# Patient Record
Sex: Female | Born: 1935
Health system: Southern US, Community
[De-identification: ages and names within clinical notes are randomized; demographics above are authoritative.]

## PROBLEM LIST (undated history)

## (undated) DIAGNOSIS — K219 Gastro-esophageal reflux disease without esophagitis: Secondary | ICD-10-CM

## (undated) DIAGNOSIS — T7840XA Allergy, unspecified, initial encounter: Secondary | ICD-10-CM

## (undated) DIAGNOSIS — E119 Type 2 diabetes mellitus without complications: Secondary | ICD-10-CM

## (undated) DIAGNOSIS — G709 Myoneural disorder, unspecified: Secondary | ICD-10-CM

## (undated) DIAGNOSIS — M549 Dorsalgia, unspecified: Secondary | ICD-10-CM

## (undated) DIAGNOSIS — H269 Unspecified cataract: Secondary | ICD-10-CM

## (undated) DIAGNOSIS — I10 Essential (primary) hypertension: Secondary | ICD-10-CM

## (undated) DIAGNOSIS — R7303 Prediabetes: Secondary | ICD-10-CM

## (undated) DIAGNOSIS — Z8042 Family history of malignant neoplasm of prostate: Secondary | ICD-10-CM

## (undated) DIAGNOSIS — M51369 Other intervertebral disc degeneration, lumbar region without mention of lumbar back pain or lower extremity pain: Secondary | ICD-10-CM

## (undated) DIAGNOSIS — Q2381 Bicuspid aortic valve: Secondary | ICD-10-CM

## (undated) DIAGNOSIS — L0390 Cellulitis, unspecified: Secondary | ICD-10-CM

## (undated) DIAGNOSIS — C541 Malignant neoplasm of endometrium: Secondary | ICD-10-CM

## (undated) DIAGNOSIS — Q231 Congenital insufficiency of aortic valve: Secondary | ICD-10-CM

## (undated) DIAGNOSIS — C801 Malignant (primary) neoplasm, unspecified: Secondary | ICD-10-CM

## (undated) DIAGNOSIS — G8929 Other chronic pain: Secondary | ICD-10-CM

## (undated) DIAGNOSIS — N189 Chronic kidney disease, unspecified: Secondary | ICD-10-CM

## (undated) DIAGNOSIS — Z809 Family history of malignant neoplasm, unspecified: Secondary | ICD-10-CM

## (undated) DIAGNOSIS — Z923 Personal history of irradiation: Secondary | ICD-10-CM

## (undated) DIAGNOSIS — M5136 Other intervertebral disc degeneration, lumbar region: Secondary | ICD-10-CM

## (undated) DIAGNOSIS — M1990 Unspecified osteoarthritis, unspecified site: Secondary | ICD-10-CM

## (undated) DIAGNOSIS — D649 Anemia, unspecified: Secondary | ICD-10-CM

## (undated) DIAGNOSIS — M479 Spondylosis, unspecified: Secondary | ICD-10-CM

## (undated) HISTORY — PX: FRACTURE SURGERY: SHX138

## (undated) HISTORY — PX: BUNIONECTOMY: SHX129

## (undated) HISTORY — PX: EYE SURGERY: SHX253

## (undated) HISTORY — PX: JOINT REPLACEMENT: SHX530

## (undated) HISTORY — DX: Allergy, unspecified, initial encounter: T78.40XA

## (undated) HISTORY — DX: Chronic kidney disease, unspecified: N18.9

## (undated) HISTORY — DX: Personal history of irradiation: Z92.3

## (undated) HISTORY — DX: Spondylosis, unspecified: M47.9

## (undated) HISTORY — DX: Family history of malignant neoplasm, unspecified: Z80.9

## (undated) HISTORY — DX: Myoneural disorder, unspecified: G70.9

## (undated) HISTORY — PX: ABDOMINAL HYSTERECTOMY: SHX81

## (undated) HISTORY — DX: Type 2 diabetes mellitus without complications: E11.9

## (undated) HISTORY — PX: OTHER SURGICAL HISTORY: SHX169

## (undated) HISTORY — DX: Dorsalgia, unspecified: M54.9

## (undated) HISTORY — DX: Prediabetes: R73.03

## (undated) HISTORY — PX: FOOT SURGERY: SHX648

## (undated) HISTORY — DX: Unspecified cataract: H26.9

## (undated) HISTORY — PX: REPLACEMENT TOTAL KNEE BILATERAL: SUR1225

## (undated) HISTORY — DX: Other chronic pain: G89.29

## (undated) HISTORY — DX: Family history of malignant neoplasm of prostate: Z80.42

## (undated) HISTORY — DX: Other intervertebral disc degeneration, lumbar region: M51.36

## (undated) HISTORY — DX: Other intervertebral disc degeneration, lumbar region without mention of lumbar back pain or lower extremity pain: M51.369

---

## 1998-12-04 ENCOUNTER — Other Ambulatory Visit: Admission: RE | Admit: 1998-12-04 | Discharge: 1998-12-04 | Payer: Self-pay | Admitting: *Deleted

## 2000-04-26 ENCOUNTER — Encounter: Payer: Self-pay | Admitting: Family Medicine

## 2000-04-26 ENCOUNTER — Ambulatory Visit (HOSPITAL_COMMUNITY): Admission: RE | Admit: 2000-04-26 | Discharge: 2000-04-26 | Payer: Self-pay | Admitting: Family Medicine

## 2001-05-31 ENCOUNTER — Other Ambulatory Visit: Admission: RE | Admit: 2001-05-31 | Discharge: 2001-05-31 | Payer: Self-pay | Admitting: Family Medicine

## 2001-11-23 ENCOUNTER — Encounter: Payer: Self-pay | Admitting: Orthopedic Surgery

## 2001-11-27 ENCOUNTER — Inpatient Hospital Stay (HOSPITAL_COMMUNITY): Admission: RE | Admit: 2001-11-27 | Discharge: 2001-12-01 | Payer: Self-pay | Admitting: Orthopedic Surgery

## 2002-01-03 ENCOUNTER — Encounter: Admission: RE | Admit: 2002-01-03 | Discharge: 2002-02-23 | Payer: Self-pay | Admitting: Orthopedic Surgery

## 2002-02-26 ENCOUNTER — Inpatient Hospital Stay (HOSPITAL_COMMUNITY): Admission: RE | Admit: 2002-02-26 | Discharge: 2002-03-02 | Payer: Self-pay | Admitting: Orthopedic Surgery

## 2002-04-12 ENCOUNTER — Encounter: Admission: RE | Admit: 2002-04-12 | Discharge: 2002-07-11 | Payer: Self-pay | Admitting: Orthopedic Surgery

## 2002-07-12 ENCOUNTER — Encounter: Admission: RE | Admit: 2002-07-12 | Discharge: 2002-08-21 | Payer: Self-pay | Admitting: Orthopedic Surgery

## 2002-12-11 ENCOUNTER — Ambulatory Visit (HOSPITAL_COMMUNITY): Admission: RE | Admit: 2002-12-11 | Discharge: 2002-12-11 | Payer: Self-pay | Admitting: Orthopedic Surgery

## 2002-12-11 ENCOUNTER — Encounter: Payer: Self-pay | Admitting: Orthopedic Surgery

## 2002-12-24 ENCOUNTER — Encounter: Admission: RE | Admit: 2002-12-24 | Discharge: 2003-01-18 | Payer: Self-pay | Admitting: Orthopedic Surgery

## 2003-04-08 ENCOUNTER — Encounter: Admission: RE | Admit: 2003-04-08 | Discharge: 2003-07-07 | Payer: Self-pay | Admitting: Orthopedic Surgery

## 2003-04-30 ENCOUNTER — Other Ambulatory Visit: Admission: RE | Admit: 2003-04-30 | Discharge: 2003-04-30 | Payer: Self-pay | Admitting: Family Medicine

## 2003-07-02 ENCOUNTER — Encounter: Admission: RE | Admit: 2003-07-02 | Discharge: 2003-07-02 | Payer: Self-pay | Admitting: Family Medicine

## 2003-08-01 ENCOUNTER — Ambulatory Visit (HOSPITAL_COMMUNITY): Admission: RE | Admit: 2003-08-01 | Discharge: 2003-08-01 | Payer: Self-pay | Admitting: Obstetrics and Gynecology

## 2003-09-04 ENCOUNTER — Ambulatory Visit (HOSPITAL_COMMUNITY): Admission: RE | Admit: 2003-09-04 | Discharge: 2003-09-04 | Payer: Self-pay | Admitting: Gastroenterology

## 2003-09-24 ENCOUNTER — Ambulatory Visit (HOSPITAL_COMMUNITY): Admission: RE | Admit: 2003-09-24 | Discharge: 2003-09-24 | Payer: Self-pay | Admitting: Obstetrics and Gynecology

## 2003-09-24 ENCOUNTER — Encounter (INDEPENDENT_AMBULATORY_CARE_PROVIDER_SITE_OTHER): Payer: Self-pay | Admitting: Specialist

## 2003-12-31 ENCOUNTER — Encounter: Admission: RE | Admit: 2003-12-31 | Discharge: 2003-12-31 | Payer: Self-pay | Admitting: Family Medicine

## 2004-02-04 ENCOUNTER — Encounter: Admission: RE | Admit: 2004-02-04 | Discharge: 2004-02-04 | Payer: Self-pay | Admitting: Neurology

## 2004-02-24 ENCOUNTER — Encounter: Admission: RE | Admit: 2004-02-24 | Discharge: 2004-03-24 | Payer: Self-pay | Admitting: Neurology

## 2004-02-27 ENCOUNTER — Encounter: Admission: RE | Admit: 2004-02-27 | Discharge: 2004-02-27 | Payer: Self-pay | Admitting: Neurology

## 2004-03-19 ENCOUNTER — Encounter: Admission: RE | Admit: 2004-03-19 | Discharge: 2004-03-19 | Payer: Self-pay | Admitting: Neurology

## 2004-04-14 ENCOUNTER — Encounter: Admission: RE | Admit: 2004-04-14 | Discharge: 2004-05-13 | Payer: Self-pay | Admitting: Neurology

## 2004-06-29 ENCOUNTER — Encounter: Admission: RE | Admit: 2004-06-29 | Discharge: 2004-06-29 | Payer: Self-pay | Admitting: Family Medicine

## 2005-07-02 ENCOUNTER — Encounter: Admission: RE | Admit: 2005-07-02 | Discharge: 2005-07-02 | Payer: Self-pay | Admitting: Family Medicine

## 2005-10-01 ENCOUNTER — Other Ambulatory Visit: Admission: RE | Admit: 2005-10-01 | Discharge: 2005-10-01 | Payer: Self-pay | Admitting: Family Medicine

## 2006-07-04 ENCOUNTER — Encounter: Admission: RE | Admit: 2006-07-04 | Discharge: 2006-07-04 | Payer: Self-pay | Admitting: *Deleted

## 2006-07-08 ENCOUNTER — Encounter: Admission: RE | Admit: 2006-07-08 | Discharge: 2006-07-08 | Payer: Self-pay | Admitting: *Deleted

## 2007-01-12 ENCOUNTER — Encounter: Admission: RE | Admit: 2007-01-12 | Discharge: 2007-04-12 | Payer: Self-pay | Admitting: Neurology

## 2007-07-06 ENCOUNTER — Encounter: Admission: RE | Admit: 2007-07-06 | Discharge: 2007-07-06 | Payer: Self-pay | Admitting: *Deleted

## 2008-07-08 ENCOUNTER — Encounter: Admission: RE | Admit: 2008-07-08 | Discharge: 2008-07-08 | Payer: Self-pay | Admitting: *Deleted

## 2009-07-09 ENCOUNTER — Encounter: Admission: RE | Admit: 2009-07-09 | Discharge: 2009-07-09 | Payer: Self-pay | Admitting: *Deleted

## 2010-04-09 ENCOUNTER — Ambulatory Visit (HOSPITAL_COMMUNITY): Admission: RE | Admit: 2010-04-09 | Discharge: 2010-04-09 | Payer: Self-pay | Admitting: Ophthalmology

## 2010-04-23 ENCOUNTER — Ambulatory Visit (HOSPITAL_COMMUNITY): Admission: RE | Admit: 2010-04-23 | Discharge: 2010-04-23 | Payer: Self-pay | Admitting: Ophthalmology

## 2010-07-14 ENCOUNTER — Encounter
Admission: RE | Admit: 2010-07-14 | Discharge: 2010-07-14 | Payer: Self-pay | Source: Home / Self Care | Attending: *Deleted | Admitting: *Deleted

## 2010-10-01 LAB — HEMOGLOBIN AND HEMATOCRIT, BLOOD: HCT: 31.8 % — ABNORMAL LOW (ref 36.0–46.0)

## 2010-10-01 LAB — BASIC METABOLIC PANEL
CO2: 26 mEq/L (ref 19–32)
Chloride: 107 mEq/L (ref 96–112)
GFR calc Af Amer: >60 mL/min (ref >60)
Sodium: 139 mEq/L (ref 135–145)

## 2010-10-01 LAB — GLUCOSE, CAPILLARY: Glucose-Capillary: 98 mg/dL (ref 70–99)

## 2010-12-04 NOTE — Op Note (Signed)
NAME:  Susan Haney, Susan Haney                         ACCOUNT NO.:  [id number]   MEDICAL RECORD NO.:  [id number]                   PATIENT TYPE:  AMB   LOCATION:  ENDO                                 FACILITY:  [facility]   PHYSICIAN:  Anselmo Rod, M.D.               DATE OF BIRTH:  Feb 27, 1932   DATE OF PROCEDURE:  09/04/2003  DATE OF DISCHARGE:                                 OPERATIVE REPORT   PROCEDURE:  Screening colonoscopy.   ENDOSCOPIST:  Charna Elizabeth, M.D.   INSTRUMENT USED:  Olympus video colonoscope.   INDICATIONS FOR PROCEDURE:  [age]-year-old white female undergoing  screening colonoscopy.  The patient has had some left lower quadrant pain.  Rule out colonic polyps, masses, hemorrhoids, diverticulosis, etc.   PROCEDURE PERFORMED:  Informed consent was procured from the patient.  The  patient fasted for eight hours prior to the procedure and prepped with a  bottle of magnesium citrate and a gallon of GOLYTELY the night prior to the  procedure.   PREPROCEDURE PHYSICAL EXAMINATION:  VITAL SIGNS:  The patient had stable  vital signs.  NECK:  Supple.  CHEST:  Clear to auscultation.  HEART:  S1 and S2 regular.  ABDOMEN:  Soft with normal bowel sounds.   DESCRIPTION OF PROCEDURE:  The patient was placed in the left lateral  decubitus position, sedated with 50 mg of Demerol and 5 mg of Versed  intravenously.  Once the patient was adequately sedated and maintained on  low flow oxygen, and continuous cardiac monitoring, the Olympus video  colonoscope was advanced from the rectum to the cecum.  The appendiceal  orifice and the ileocecal valve were clearly visualized and photographed.  No masses, polyps, erosions, ulcerations or diverticula were seen.  Retroflexion in the rectum revealed no abnormalities.   IMPRESSION:  Normal colonoscopy to the cecum.   RECOMMENDATIONS:  1. Continue a high fiber diet.  2. Liberal fluid intake.  3. Repeat CRC screening in the next 10 years  unless the patient develops any     abnormal symptoms in the interim.  4. Outpatient follow as needed in the future.                                               Anselmo Rod, M.D.    JNM/MEDQ  D:  09/04/2003  T:  09/04/2003  Job:  [id number]   cc:   Ernestina Penna, M.D.  [address] [place]  [place] [address]  Fax: [phone]   Osborn Coho, M.D.

## 2010-12-04 NOTE — H&P (Signed)
[place]  Patient:    LEIGH, BLAS Visit Number: [id number] MRN: [id number]          Service Type: Attending:  Georgena Spurling, M.D. Dictated by:   Jamelle Rushing, P.A. Adm. Date:  11/27/01                           History and Physical  DATE OF BIRTH:  11/04/1931  CHIEF COMPLAINT:  Left knee pain.  HISTORY OF PRESENT ILLNESS:  The patient is a 77 year old white female with approximately four years of progressively worsening left knee pain.  The patient states that she started with just a chronic swelling sensation in the knee, was evaluated by an orthopedist up in [place] and received multiple cortisone injections without improvement.  Further evaluation several months later indicated meniscal injury, so an arthroscopic procedure was done to debride the meniscus and the patient continued to have no improvement.  The patient has continued to progressively worsen with swelling and pain in her knee over the years.  She has recently tried Hyalgan injections with no improvement.  The patient currently is unable to fully extend her knee at the current time.  She does have a sharp pain with some dullness and burning sensation in the knee at various times with ambulation.  She does have significant night pain.  She does have significant swelling.  She denies any mechanical symptoms other than when forcibly trying to hyperextend her knee. The patient is not using any assistive device at this time and x-rays show severe tricompartment osteoarthritis.  ALLERGIES:  No known drug allergies.  CURRENT MEDICATIONS: 1. Celebrex 200 mg p.o. q.d. 2. Ultracet one or two tablets every day. 3. Prinzide 10/12.5 mg p.o. q.d. 4. Prilosec 20 mg p.o. q.d. 5. Calcium 1500 mg p.o. q.d. 6. Multivitamin one tablet p.o. q.d. 7. ______ hormone therapy one tablet p.o. q.d.  PAST MEDICAL HISTORY: 1. Hypertension, currently well-controlled with Dr. Monica Becton. 2. Reflux disease -- improves with Prilosec. 3. The patient is scheduled for a cardiac stress test on May 7th with the    [facility] as a precautionary evaluation; results will be forwarded to    our office when available.  PAST SURGICAL HISTORY: 1. de Quervains, right wrist. 2. Arthroscopy, left knee.  The patient denies any complications with either of the above procedures.  SOCIAL HISTORY:  The patient is an obese 77 year old white female who denies any smoking or alcohol use.  The patient is currently married.  She does have several grown children.  She lives in a [place] house with a full basement. 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  FAMILY PHYSICIAN:  Dr. Monica Becton.  CARDIOLOGIST:  Dr. Cecil Cranker.  FAMILY MEDICAL HISTORY:  Mother is deceased from complications of diabetes and heart failure.  Father is deceased from prostate cancer.  The patient has got two brothers, the eldest having diabetes mellitus, type 2, the second healthy. The patient has got three sisters, one of them having diabetes mellitus, type 2, the rest being in good medical health.  REVIEW OF SYSTEMS:  Positive for glasses at all times.  The patient does have occasional shortness of breath with exertion, probably due to her conditioning due to her knee problems and her obesity.  She is being evaluated with a cardiac stress test on May 5th.  The patient  also has problems with reflux, increases with the Celebrex use, but it is currently well-controlled with Prilosec.  Otherwise, review of systems are all negative.  PHYSICAL EXAMINATION:  VITAL SIGNS:  Height is 5 foot 5 inches.  Weight is 232 pounds.  Pulse rate 72 and regular.  Respirations 14.  Temperature is 98.8.  Blood pressure is 150/88.  GENERAL:  This is a healthy-appearing well-developed, obese white female.  She ambulates slowly but with no significant limp.  She  does have a slightly obvious left leg valgus deformity.  She is able to get on and off the exam table without much difficulty.  HEENT:  Head was normocephalic, atraumatic, nontender over maxillary or frontal sinuses.  Pupils equal, round and reactive, accommodating to light. Extraocular movements intact.  Sclerae not icteric.  Conjunctivae pink and moist.  External ears were without deformities.  Canals patent.  TMs pearly gray and intact.  Gross hearing is intact.  Nasal septum was midline.  Mucous membranes pink and moist.  Oral buccal mucosa was pink and moist without lesions.  Dentition was in good repair.  Uvula was midline.  The patient is able to swallow without difficulty.  NECK:  The patient had no palpable lymphadenopathy.  Thyroid gland was nontender.  She had good range of motion of her cervical spine without any difficulty.  BACK:  The rest of the thoracic and lumbar spine was only tender to percussion in the lower lumbar region.  CHEST:   Lung sounds were clear and equal bilaterally.  No wheezes, rales, rhonchi or rubs noted.  HEART:  Regular rate and rhythm.  S1 and S2 were auscultated.  No murmurs, rubs, or gallops noted.  ABDOMEN:  Round, obese, soft, nontender to deep palpation.  Unable to palpate any hepatosplenomegaly.  Bowel sounds were normoactive throughout.  CVA was nontender to percussion.  EXTREMITIES:  Upper extremities were symmetrically sized and shaped.  She had excellent range of motion of her shoulders, elbows and wrists without any difficulty.  Motor strength was 5/5.  Lower extremities:  Right and left hips had full extension, flexion up to 90 degrees, limited by habitus.  The patient had 20 degrees internal/external rotation without any mechanical symptoms or discomfort.  Bilateral knees were virtually symmetrically sized and shaped without any signs of erythema or ecchymosis, except for the left knee had some slight proximal tibial  swelling. There were no palpable effusions of either knee.  Right knee had full  extension, flexion back to approximately 100 degrees, limited by body habitus. She had no valgus or varus laxity and no anterior or posterior drawer and the calf was nontender.  Left knee was significantly tender along the medial joint line and the medial proximal tibia.  She had some coarse crepitus under the patella with range of motion, which was limited from 10 to 90 degrees.  She had no significant valgus/varus laxity, though she did have an approximately 15 degree valgus deformity.  The calf was nontender.  Bilateral ankles were symmetrical with good dorsi/plantar flexion.  PERIPHERAL VASCULATURE:  Carotid pulses were 2+, radial pulses 2+, dorsalis pedis and posterior tibial pulses were 1+.  The patient had 1+ pitting edema. She had no significant pigmentation changes and no significant varicosities and the patient had no carotid bruits noted.  NEUROLOGIC:  The patient was conscious, alert and appropriate, held an easy conversation with the examiner.  Cranial nerves II-XII were grossly intact. Deep tendon reflexes of the biceps, triceps, brachioradialis, knee and Achilles  were symmetrical, right to left, 1+.  The patient was grossly intact to light touch sensation from head to toe.  BREASTS, RECTAL AND GU:  Exams were deferred at this time.  IMPRESSION: 1. End-stage tricompartment osteoarthritis, left knee. 2. Hypertension. 3. Reflux disease. 4. Obesity.  PLAN:  The patient is scheduled for a cardiac stress test with Dr. Jackey Loge clinic on May 7th.  The patient reports that all reports will be forwarded to our office when they become available.  The patient is scheduled for a left total knee arthroplasty at [facility] on May 12th.  The patient will undergo all other routine labs and tests prior to this procedure.Dictated by: Jamelle Rushing, P.A. Attending:  Georgena Spurling, M.D. DD:   11/21/01 TD:  11/22/01 Job: [id number]

## 2010-12-04 NOTE — Op Note (Signed)
   NAMEMarland Kitchen  Susan Haney, Susan Haney                         ACCOUNT NO.:  [id number]   MEDICAL RECORD NO.:  [id number]                   PATIENT TYPE:  INP   LOCATION:  5029                                 FACILITY:  [facility]   PHYSICIAN:  Mila Homer. Sherlean Foot, M.D.              DATE OF BIRTH:  Oct 08, 1931   DATE OF PROCEDURE:  DATE OF DISCHARGE:                                 OPERATIVE REPORT   ASSISTANT:  Jamelle Rushing, P.A.   PREOPERATIVE DIAGNOSIS:  Right knee osteoarthritis.   POSTOPERATIVE DIAGNOSIS:  Right knee osteoarthritis.   PROCEDURE:  Right total knee arthroplasty.   INDICATIONS FOR PROCEDURE:  The patient is a 77 year old white female with  failure of conservative measures for osteoarthritis of the knee.  The left  knee was done three months ago.  Informed consent was obtained.   DESCRIPTION OF PROCEDURE:  The patient was laid supine, administered general  anesthesia and a Foley catheter placed.  The right lower extremity was  prepped and draped in the usual sterile fashion.  A standard midline  incision was made with a #10 blade, and then a fresh blade was used to make  a median parapatellar arthrotomy.  A synovectomy was performed.                                               Mila Homer. Sherlean Foot, M.D.    SDL/MEDQ  D:  02/26/2002  T:  02/28/2002  Job:  [id number]

## 2010-12-04 NOTE — Op Note (Signed)
[place]  Patient:    Susan Haney, Susan Haney Visit Number: [id number] MRN: [id number]          Service Type: SUR Location: 5000 [id number] [id number] Attending Physician:  Georgena Spurling Dictated by:   Georgena Spurling, M.D. Proc. Date: 11/27/01 Admit Date:  11/27/2001 Discharge Date: 12/01/2001                             Operative Report  PREOPERATIVE DIAGNOSIS:  Left knee arthritis.  POSTOPERATIVE DIAGNOSIS:  Left knee arthritis.  OPERATION PERFORMED:  Left total knee arthroplasty.  SURGEON:  Georgena Spurling, M.D.  ASSISTANT:  Jamelle Rushing, P.A.  ANESTHESIA:  General.  INDICATIONS FOR PROCEDURE:  The patient is an elderly white female with failure of conservative treatment of arthritis of the knee.  Informed consent was obtained.  DESCRIPTION OF PROCEDURE:  The patient was laid supine and administered general endotracheal anesthesia and Foley catheter placement.  The left lower extremity was then prepped and draped in the usual sterile fashion.  We then made a standard midline incision with a #10 blade and used a fresh 10 blade to perform a medial parapatellar arthrotomy.  Then I everted the patella and measured it to be 21 mm thick with calipers and used a 32 mm diameter reamer to ream down to 12 mm, used a 32 mm template to drill three lug holes and with the trial in place and also measured 21 mm.  We then everted the patella and removed the trial and subperiosteally dissected the proximal tibia, freeing the deep MCL all the way around to the semimembranosus tendon.  We then placed a Z-retractor in place, brought the knee into flexion with the patella everted, cut the ACL and PCL.  We then subluxed the tibia forward and aligned our tibial tower to make a cut perpendicular to the long axis of the tibia. We used the ____________ positioner, to judge our depth of resection.  At this point we pinned our tibial tower into place and cut our tibia with  the sagittal saw.  At this point I then removed the cut surface of the tibia and turned our attention to the femur.  Then made an intramedullary hole into the femur.  Then had the intramedullary guide set on 6 degrees left, tamped down to the distal aspect of the femur.  At this point I pinned the three degree cutting block into place and removed the intramedullary guide.  I then made a distal femoral cut and removed the distal condyles.  We then drew our epicondylar axis.  Then measured the posterior condylar angle at 3 degrees. Then placed the sizer in place, sized to a size E and at this point we pinned through the 3 degree hole.  At this point we then used the 4 in 1 cutter and made our anterior, posterior and chamfer cuts.  We then pushed the lamina spreader in the lateral compartment and removed our ACL, PCL, and posterior condylar osteophytes and medial meniscus.  We then placed it in the medial compartment and removed our lateral meniscus, posterior condylar osteophytes and stripped the posterior capsule off the calcar of the femur.  We then trialed with the 10 mm spacer block and had good flexion and extension gap balancing.  We then finished the femur with the finishing guide cutting our notch and lug holes.  We then finished our tibia with a size 4 tibia  with a drill keel.  At this point we trialed and had excellent flexion and extension and good patellar tracking.  We then removed the trials and copiously irrigated with the pulse lavage system.  We then mixed cement and cemented the tibia first, femur second, patella third and snapped in the real 10 mm polyethylene.  Once the cement had hardened, we let the tourniquet down and cauterized bleeding vessels and began to close.  We did leave a medium Hemovac deep to the arthrotomy.  We used #1 Vicryl figure-of-eight sutures to close the arthrotomy and 2-0 sutures to close the deep soft tissues, a subcuticular 2-0 Vicryl and then  skin staples placed in 100 degrees of flexion.  We dressed with Adaptic, 4 x 4s, sterile Webril and TED stockings.  Tourniquet time was 47 minutes.  COMPLICATIONS:  None.  DRAINS:  One Hemovac.  ESTIMATED BLOOD LOSS:  300 cc. Dictated by:   Georgena Spurling, M.D. Attending Physician:  Georgena Spurling DD:  11/27/01 TD:  11/29/01 Job: [id number]

## 2010-12-04 NOTE — H&P (Signed)
NAME:  Susan Haney, Susan Haney                         ACCOUNT NO.:  [id number]   MEDICAL RECORD NO.:  [id number]                   PATIENT TYPE:  INP   LOCATION:  NA                                   FACILITY:  [facility]   PHYSICIAN:  Mila Homer. Sherlean Foot, M.D.              DATE OF BIRTH:  Nov 14, 1931   DATE OF ADMISSION:  02/26/2002  DATE OF DISCHARGE:                                HISTORY & PHYSICAL   CHIEF COMPLAINT:  Right knee pain.   HISTORY OF PRESENT ILLNESS:  The patient is a 77 year old white female with  a history of left total knee arthroplasty in May 2003 with good results.  Since that time the patient has noticed significantly increased worsening of  her right knee discomfort. She states it crunches with any type of  ambulation. She describes the pain as a deep aching sensation with no  radiation. She does have night pain, which is worse with long activities  throughout the day. X-rays revealed a severe osteoarthritis right knee.   ALLERGIES:  No known drug allergies.   CURRENT MEDICATIONS:  1. Bextra 10 mg p.o. q.d.  2. Vicodin p.r.n.  3. Prinizide 10/12.5 mg p.o. q.d.  4. Prilosec 20 mg p.o. q.d.  5. Menopause support 1 tablet p.o. q.d.   PAST MEDICAL HISTORY:  1. Hypertension.  2. Reflux disease.  3. Obesity.   PAST SURGICAL HISTORY:  1. Right wrist deQuervain debridement in 1993.  2. Left knee arthroscopy in 1998.  3. Left total knee arthroplasty in 2003.   The patient denies any significant complications with the above mentioned  procedures.   SOCIAL HISTORY:  The patient is a 77 year old white obese female. The  patient denies any history for alcohol use. She is married. Lives with her  husband in a one story house. She is a retired Curator.   FAMILY PHYSICIAN:  Ernestina Penna, M.D.   FAMILY MEDICAL HISTORY:  Mother is deceased from diabetes and heart failure.  Father is deceased from prostate cancer. The patient has two brothers and  three  sisters alive and in good health.   REVIEW OF SYMPTOMS:  Positive for glasses at all times. She does have  shortness of breath with exertion, but she does not have any chest pain,  diaphoresis related with it and she contributes this to her deconditioning  and knee discomfort. The patient does have reflux which improves with  Prilosec and occasional problems with diarrhea.   PHYSICAL EXAMINATION:   VITAL SIGNS:  Height is 5 feet 5, weight is 230 pounds, pulse 84 and  regular, respirations 12, temperature 99.2, blood pressure 162/78.   GENERAL:  This is a healthy-appearing well-developed, slightly obese white  female. Ambulated with altered gait due to left total knee arthroplasty  three months old and a right severe osteoarthritis. The patient is able to  get herself on an off of the exam  table without any difficulty.   HEENT:  Head is normocephalic, atraumatic. Nontender over maxillary or  frontal sinuses. Pupils equal, round, and reactive to light and  accommodation. Extraocular movements are intact. Sclerae is nonicteric.  Conjunctivae is pink and moist without lesions. External ears without  deformity. Canals are patent. TMs are pearly gray and intact. Gross hearing  is intact. Nasal septum is deviated to the left. Nares are patent. Oral  buccal mucosa is pink and moist without lesions. Dentition was in good  repair. Uvula was midline, symmetrical with phonation. The patient is able  to swallow without difficulty.   NECK:  Supple. No palpable lymphadenopathy. Thyroid gland was nontender. The  patient had good range of motion of the cervical spine without any  difficulty or tenderness. She had no tenderness with percussion along the  spinous processes of the entire spinal column.   CHEST:  Lung sounds were clear and equal bilaterally. No wheezes, rales,  rhonchis, or rubs noted.   HEART:  Regular rate and rhythm. S1 and S2 auscultated. No murmurs, rubs, or  gallops noted.    ABDOMEN:  Round, obese, soft. Unable to palpate any hepatosplenomegaly due  to her obesity. Bowel sounds are normal active throughout. CVA was nontender  to percussion.   EXTREMITIES:  Upper extremity was symmetrically size and shape. She has  excellent range of motion of her shoulders, elbows, and wrists without any  difficulty. Motor strength was 5/5.   Lower extremity:  Right and left hip had full extension, flexion up to 100  degrees with 20 degrees internal external rotation without any mechanical  symptoms or discomfort. The left knee had a well-healed midline surgical  incision. It still had some slight amount of soft tissue swelling, but no  palpable effusion. Range of motion was five degrees short of full extension  and flexion back to 95 degrees. She had no instability in the knee and the  calf was nontender. The right knee was without any signs of erythema or  ecchymosis. She did have some slight soft tissue swelling. No palpable  effusion. She was tender along the medial joint line region. Range of motion  was full extension and flexion back to 100 degrees. She had no instability  or valgus varus laxity. She had no calf tenderness. Bilateral ankles were  symmetrical with good dorsi and plantar flexion.   Peripheral vasculature:  The carotid pulses were 2+, no bruits. The radial  pulses were 2+. Dorsalis pedis and posterior tibial pulses were 1+. She had  1+ pitting edema in the lower extremities with some scattered varicosities,  but no pigmentation changes.   NEUROLOGIC:  The patient was conscious, alert, and appropriate. Held easy  conversation. Examination of cranial nerves 2-12 were grossly intact. Deep  tendon reflexes of the upper and lower extremities were symmetrical right to  left. The patient was intact head to toe to light touch sensation.   Breast, rectal, and GU exams were deferred at this time.    IMPRESSION: 1. End-stage osteoarthritis right knee.  2.  Hypertension.  3. Reflux disease.  4. Obesity.   PLAN:  The patient will be admitted to [facility] on 02/26[facility] under  the care of Dr. Georgena Spurling. The patient will undergo all routine labs and  tests prior to having a right total knee arthroplasty.   DICTATED BY::  Jamelle Rushing, P. A.  Mila Homer. Sherlean Foot, M.D.    SDL/MEDQ  D:  02/14/2002  T:  02/19/2002  Job:  [id number]

## 2010-12-04 NOTE — Op Note (Signed)
NAME:  Susan Haney, BERTE                         ACCOUNT NO.:  [id number]   MEDICAL RECORD NO.:  [id number]                   PATIENT TYPE:  AMB   LOCATION:  SDC                                  FACILITY:  [facility]   PHYSICIAN:  Osborn Coho, M.D.                DATE OF BIRTH:  [birth year]   DATE OF PROCEDURE:  09/24/2003  DATE OF DISCHARGE:                                 OPERATIVE REPORT   PREOPERATIVE DIAGNOSES:  1. Left ovarian mass.  2. Thickened endometrial echo.  3. Submucosal fibroid.   POSTOPERATIVE DIAGNOSES:  1. Left ovarian mass.  2. Thickened endometrial echo.  3. Submucosal fibroid.   PROCEDURE:  1. Laparoscopic left oophorectomy.  2. Hysteroscopy with resection of fibroid.  3. D&C.   ANESTHESIA:  General.   ATTENDING:  Dr. Osborn Coho.   ASSISTANT:  Dr. Jaymes Graff for laparoscopy portion of procedure.   FLUIDS:  2300 mL.   URINE OUTPUT:  150 mL.   ESTIMATED BLOOD LOSS:  Minimal.   COMPLICATIONS:  None.   PATHOLOGY:  Left ovary and tube, serous cyst fluid, portions of submucosal  fibroid, endometrial curetting.   FINDINGS:  Approximately a 7 cm left ovarian mass with internally serous  straw-colored fluid and approximately 2-3 cm right fundal fibroid  subserosal, and approximately 2 cm submucosal fibroid.   DESCRIPTION OF PROCEDURE:  The patient was taken to the operating room after  the risks, benefits, and alternatives were discussed with the patient.  The  patient verbalized understanding and consent signed and witnessed.  The  patient was placed under general anesthesia and prepped and draped in the  normal sterile fashion.  A bivalve speculum was placed in the patient's  vagina and the anterior lip of the cervix grasped with a single-tooth  tenaculum.  The cervix was dilated for passage of the Hulka, and the Hulka  was introduced for intrauterine manipulation.  Attention was then turned to  the abdomen where a 10 mm umbilical incision was  made.  A Veress needle was  passed into the intra-abdominal cavity and pneumoperitoneum achieved.  The  ovaries were noted bilaterally, and the left was noted to have an  approximately 7 cm simple cyst.  Attention was then turned to the right and  left lower quadrants where a 5 mm incision was made respectively in each  lower quadrant.  Then 5 mm trocars were advanced under direct visualization  in the left and right lower quadrants.  The cyst was aspirated with serous  straw-colored fluid returning which was sent to pathology.  Vicryl Endoloops  were then placed x 2 around the ovary after the uteroovarian ligament and  fallopian tube were excised from the uterus using the tripolar.  After the  ovary and fallopian tube were ligated using the two Vicryl Endoloops, the  ovary and fallopian tube were excised and sent to pathology.  In order to  remove the ovary and fallopian tube from the intra-abdominal cavity, an  Endopouch was used.  Prior to using the Endopouch, the left lower quadrant  incision was made into a 10 mm incision, and 10 mm trocar advanced under  direct visualization.  The Endopouch with contents of fallopian tube and  ovary were removed without difficulty.  The tripolar was then used to  cauterize the remaining pedicle which was not incorporated in the Endoloop  immediately adjacent to the uterus and near the broad ligament and round  ligament.  Hemostasis was noted.  Washing had been sent prior to the  aspiration.  The pneumoperitoneum was relieved.  The 10 mm trocar sites were  repaired using 0 Vicryl on the fascia and 3-0 Monocryl on subcuticular  stitch.  Then 3-0 Monocryl was used as an interrupted to close the right  lower quadrant 5 mm incision.  Attention was then turned to the vagina where  a bivalve speculum was again placed in the patient's vagina and the Hulka  removed.  A tenaculum was placed on the anterior lip. and the cervix was  dilated for passage of  diagnostic hysteroscope.  The uterus had been sounded  to approximately 8 cm.  The diagnostic hysteroscope was introduced, and a  fundal submucosal fibroid was noted.  The resectoscope was then used after  dilating the cervix for passage of the resectoscope and submucosal fibroid  was resected and portions of fibroid sent to pathology.  Instruments were  removed after curettage was performed and tissue sent to pathology.  There  was minimal tissue returning from the curettage.  The tenaculum sites were  hemostatic.  Sponge, lap, and needle count was correct.  The patient  tolerated the procedure well and was returned to recovery room in good  condition.                                               Osborn Coho, M.D.    AR/MEDQ  D:  09/24/2003  T:  09/25/2003  Job:  [id number]

## 2010-12-04 NOTE — Discharge Summary (Signed)
NAME:  Susan Haney, Susan Haney                         ACCOUNT NO.:  [id number]   MEDICAL RECORD NO.:  [id number]                   PATIENT TYPE:  INP   LOCATION:  5029                                 FACILITY:  [facility]   PHYSICIAN:  Jamelle Rushing, P.A.                DATE OF BIRTH:  November 13, 1931   DATE OF ADMISSION:  02/26/2002  DATE OF DISCHARGE:  03/02/2002                                 DISCHARGE SUMMARY   ADMISSION DIAGNOSES:  1. Right knee end stage osteoarthritis.  2. Hypertension.  3. Reflux disease.  4. Obesity.   DISCHARGE DIAGNOSES:  1. Right total knee arthroplasty.  2. Postop blood loss anemia, asymptomatic.  3. Hypokalemia.  4. Hypertension.  5. Reflux disease.  6. Obesity.   HISTORY OF PRESENT ILLNESS:  The patient is a 77 year old white female with  a history of right knee pain for many years. The patient had a left total  knee arthroplasty in May of 2003 with good results up until this point. The  pain in the right knee worsens after the left total knee arthroplasty. The  pain is described as a deep aching sensation with crunching with ambulation.  It is mostly along the medial joint line. She does have swelling. She does  have night pain with long periods of day ambulation.   ALLERGIES:  No known drug allergies.   CURRENT MEDICATIONS:  1. Bextra 10 mg po QD.  2. Vicodin as needed.  3. Prinizide 10/12.5 mg po QD.  4. Prilosec 20 mg po QD.  5. Metaphase support daily.   SURGICAL PROCEDURE:  On February 26, 2002 the patient was taken to the OR by  Dr. Georgena Spurling, assisted by Arlyn Leak, P.A.C. and under general  anesthesia, the patient underwent a right total knee arthroplasty. The  patient tolerated the procedure well. One medium Hemovac drain was left in  place and the patient had a postop femoral nerve block for assistance in  pain management. The patient was transferred to the recovery room and then  to the orthopedic floor in good condition.   CONSULTATIONS:  Physical therapy, occupational therapy, and case management.   HOSPITAL COURSE:  On February 26, 2002 the patient was admitted to [facility] under the care of Dr. Sherlean Foot. The patient was taken to the OR where  a right total knee arthroplasty was performed. The patient tolerated the  procedure well. Received a postop femoral nerve block for pain management  assistance and was transferred to the recovery room and then to the  orthopedic floor in good condition. The patient was then placed on Lovenox  for routine deep vein thrombosis prophylaxis. The patient then incurred a  total of four days postop care on the orthopedic floor in which the patient  did develop some asymptomatic postop blood loss anemia. This was just  monitored and felt to be comfortably managed with iron  supplement and allow  the patient to recover on her own. The patient did develop some hypokalemia  with her potassium dropping to 3.4. She did have po replacement and did  improve without any problems. The patient worked well with PT and used the  CPM well. She was able to reach 90 degrees without any problems She was able  to ambulate at least 50 degrees with just supervision with a straight  walker. The patient's wound remained benign for any signs of infection. Leg  remained neuro motor vascularly intact. So on postop day four, the patient  was felt to be ready to be discharged to home in good condition.  Arrangements were made for home health follow-up and the patient was  discharged.   LABORATORY DATA:  Routine chemistry on March 02, 2002 revealed sodium of  138, potassium 3.8, glucose 137, BUN 14, creatinine 0.8. CBC revealed WBC of  10.4. Hemoglobin and hematocrit 9.3 and 28.8. Platelets 288. Routine  urinalysis on March 01, 2002 was negative for any signs of urinary tract  infection.   DIAGNOSTIC STUDIES:  Admission chest x-ray showed no evidence of active  disease.   HOSPITAL  MEDICATIONS:  1. Colace 100 mg po bid.  2. Trinsicon one tab po tid.  3. Lovenox 30 mg subcutaneous every 12 hours. Upon discharge, will be 40 mg     subcutaneous daily.  4. Lisinopril 10 mg po QD.  5. HCTZ 12.5 mg po QD.  6. Protonix 40 mg po QD.  7. OxyContin CR 10 mg po every 12 hours.  8. Potassium chloride 20 mEq po bid.  9. Laxative or enema of choice as needed.  10.      Percocet one or two tabs every 4-6 hours as needed.  11.      Tylenol 650 mg po every four hours as needed.  12.      Restoril 30 mg po QHS as needed.   DISCHARGE MEDICATIONS:  1. The patient is to continue routine home meds except for Vicodin.  2. OxyContin CR 10 mg one tablet every 12 hours.  3. Percocet one or two tabs every 4-6 hour as needed pain.  4. Lovenox 40 mg injection once daily for ten days.   ACTIVITY:  As instructed by physical therapy.   DIET:  No restrictions.   WOUND CARE:  Keep wound clean. Check daily for any signs of infection.   SPECIAL INSTRUCTIONS:  May shower.    FOLLOW UP:  The patient needs to call for a follow-up appointment at [phone] for an appointment on March 13, 2002.   CONDITION ON DISCHARGE:  Good.                                                Jamelle Rushing, P.A.    RWK/MEDQ  D:  03/02/2002  T:  03/06/2002  Job:  [id number]   cc:   Mila Homer. Sherlean Foot, M.D.   Ernestina Penna, M.D.

## 2010-12-04 NOTE — Discharge Summary (Signed)
[facility]  Patient:    Susan Haney, Susan Haney Visit Number: [id number] MRN: [id number]          Service Type: SUR Location: 5000 [id number] [id number] Attending Physician:  Georgena Spurling Dictated by:   Jamelle Rushing, P.A. Admit Date:  11/27/2001 Discharge Date: 12/01/2001                             Discharge Summary  ADMISSION DIAGNOSES: 1. End-stage tricompartment osteoarthritis, left knee. 2. Hypertension. 3. Reflux disease. 4. Obesity.  DISCHARGE DIAGNOSES: 1. Left total knee arthroplasty. 2. Postoperative blood loss anemia, asymptomatic. 3. Hypertension. 4. Reflux disease. 5. Obesity.  HISTORY OF PRESENT ILLNESS:  The patient is a 77 year old white female with an approximately four-year history of left knee pain.  Initially, the problem started with swelling in her left knee.  She had multiple cortisone injections without improvement.  Arthroscopic evaluation with meniscal surgery also did not improve her symptoms.  The pain progressively worsened with any type of ambulation.  She also had no improvement with hyalgon.  The pain is described as a sharp burning sensation at times with awkward movements or a constant aching sensation that does radiate down the leg.  She does have swelling in the knee around the medial aspect of the joint.  She has no mechanical symptoms.  She does have night pain.  She is not currently using an assistive device.  X-rays of the knee show severe tricompartment osteoarthritis.  ALLERGIES:  No known drug allergies.  MEDICATIONS: 1. Celebrex 200 mg p.o. q.d. 2. Ultracet two tablets p.o. q.d. 3. Prinizide 10/12.5 mg p.o. q.d. 4. Prilosec 20 mg p.o. q.d. 5. Calcium 1500 mg p.o. q.d. 6. Multivitamin one tablet p.o. q.d. 7. Nuphase hormone replacement therapy.  PROCEDURE:  On Nov 27, 2001, the patient was taken to the OR by Georgena Spurling, M.D. and assisted by Jamelle Rushing, P.A.  The patient underwent a left  total knee arthroplasty under general anesthesia.  Estimated blood loss was 300 cc. One hemovac drain was left in place.  There were no complications.  The patient received postoperative femoral nerve block for pain management prior to being released from the OR to the recovery room and then to the orthopedic floor in good condition.  CONSULTING PHYSICIANS:  The following routine consults were requested: physical therapy, occupational therapy, rehabilitation, case management.  HOSPITAL COURSE:  On Nov 27, 2001, the patient was admitted to [facility] under the care of Georgena Spurling, M.D.  The patient was taken to the OR where a left total knee arthroplasty was performed under general anesthesia.  The patient had an estimated 300 cc of blood loss.  The patient tolerated the procedure well.  She received postoperative femoral nerve block and was transferred to the recovery room and then to the orthopedic floor in good condition.  The patient was started on Lovenox 30 mg subcu q.12h. for routine DVT prophylaxis.  The patient then incurred a total of four days postoperative care on the orthopedic floor.  She did develop some postoperative blood loss anemia with her H&H dropping to 9.0 over 27.1, but she remained asymptomatic.  The patients otherwise vital signs remained stable.  Her chemistries also remained stable.  The patient did have some lower extremity calf discomfort, so a DVT evaluation was performed with a venous Doppler and this was negative. The patient continued to work well with physical  therapy and it was felt on postoperative day #4, the patient was both orthopedically and medically stable for discharge to home for continued outpatient physical therapy and CPM rehabilitation care.  Arrangements were made and the patient was discharged.  LABORATORY DATA:  CBC on May 15; WBC 9.0, hemoglobin 8.9, hematocrit 26.8, platelets 202.  Routine chemistries on May 14;  sodium 138, potassium 3.5, glucose 136, BUN 6, creatinine 0.7, elevated glucose was felt to be normal postoperative stress.  Routine urinalysis on admission was normal.  Chest x-ray on admission showed no evidence of active disease within the chest.  EKG on admission was normal sinus rhythm, moderate voltage criteria for LVH at 75 beats per minute.  MEDICATIONS AS DISPENSED FROM ORTHO FLOOR:  1. Vioxx 12.5 mg p.o. q.d.  2. Lisinopril 10 mg p.o. q.d.  3. Hydrochlorothiazide 12.5 mg p.o. q.d.  4. Protonix 40 mg p.o. q.d.  5. Multivitamin with minerals one capsule p.o. q.d.  6. Calcium carbonate 500 mg p.o. q.d.  7. Lovenox 30 mg subcu q.12h. change to 40 mg subcu q.d. on date of     discharge.  8. Colace 100 mg p.o. q.d.  9. Senokot one tablet p.o. b.i.d. a.c. 10. Trinsicon one tablet p.o. t.i.d. 11. Potassium chloride 20 mEq p.o. b.i.d. 12. Laxative or enema of choice p.r.n. 13. Percocet one or two tablets every four to six hours p.r.n. 14. Tylenol 650 mg p.o. q.4h. p.r.n. 15. Robaxin 500 mg p.o. q.6h. p.r.n. 16. Restoril 30 mg p.o. q.h.s. p.r.n.  DISCHARGE INSTRUCTIONS:  The patient is to resume home medications except for Naprosyn.  Vioxx 12.5 mg one tablet a day, Percocet 5 mg one or two tablets every four to six hours for pain if needed, Trinsicon one tablet p.o. with meals until gone, Lovenox 40 mg injection one injection a day for 10 days. Activity as tolerated with use of a walker.  DIET:  No restrictions.  WOUND CARE:  Keep wound clean and dry.  If any signs of infection, call Dr. Sherlean Foot.  DISCHARGE INSTRUCTIONS:  CPM is to be at 0 to 70 degrees eight hours a day, increase up to 110 degrees.  FOLLOW-UP:  The patient is to have a follow-up appointment with Dr. Sherlean Foot in 10 days.  The patient is to call for an appointment.  CONDITION ON DISCHARGE:  Improved and good. Dictated by:   Jamelle Rushing, P.A. Attending Physician:  Georgena Spurling DD:  12/01/01 TD:   12/04/01 Job: [id number]

## 2011-06-16 ENCOUNTER — Other Ambulatory Visit: Payer: Self-pay | Admitting: *Deleted

## 2011-06-16 DIAGNOSIS — Z1231 Encounter for screening mammogram for malignant neoplasm of breast: Secondary | ICD-10-CM

## 2011-07-16 ENCOUNTER — Ambulatory Visit
Admission: RE | Admit: 2011-07-16 | Discharge: 2011-07-16 | Disposition: A | Discharge status: Home / Self Care | Payer: Medicare Other | Source: Ambulatory Visit | Attending: *Deleted | Admitting: *Deleted

## 2011-07-16 DIAGNOSIS — Z1231 Encounter for screening mammogram for malignant neoplasm of breast: Secondary | ICD-10-CM

## 2012-07-17 ENCOUNTER — Other Ambulatory Visit: Payer: Self-pay | Admitting: *Deleted

## 2012-07-17 DIAGNOSIS — Z1231 Encounter for screening mammogram for malignant neoplasm of breast: Secondary | ICD-10-CM

## 2012-07-18 ENCOUNTER — Ambulatory Visit
Admission: RE | Admit: 2012-07-18 | Discharge: 2012-07-18 | Disposition: A | Discharge status: Home / Self Care | Payer: Medicare Other | Source: Ambulatory Visit | Attending: *Deleted | Admitting: *Deleted

## 2012-07-18 DIAGNOSIS — Z1231 Encounter for screening mammogram for malignant neoplasm of breast: Secondary | ICD-10-CM

## 2013-03-20 ENCOUNTER — Other Ambulatory Visit: Payer: Self-pay | Admitting: Anesthesiology

## 2013-03-20 DIAGNOSIS — M5137 Other intervertebral disc degeneration, lumbosacral region: Secondary | ICD-10-CM

## 2013-03-20 DIAGNOSIS — M47817 Spondylosis without myelopathy or radiculopathy, lumbosacral region: Secondary | ICD-10-CM

## 2013-03-27 ENCOUNTER — Ambulatory Visit
Admission: RE | Admit: 2013-03-27 | Discharge: 2013-03-27 | Disposition: A | Discharge status: Home / Self Care | Payer: Medicare Other | Source: Ambulatory Visit | Attending: Anesthesiology | Admitting: Anesthesiology

## 2013-03-27 DIAGNOSIS — M5137 Other intervertebral disc degeneration, lumbosacral region: Secondary | ICD-10-CM

## 2013-03-27 DIAGNOSIS — M47817 Spondylosis without myelopathy or radiculopathy, lumbosacral region: Secondary | ICD-10-CM

## 2013-03-29 ENCOUNTER — Ambulatory Visit: Payer: Medicare Other | Attending: Anesthesiology | Admitting: Physical Therapy

## 2013-03-29 DIAGNOSIS — R293 Abnormal posture: Secondary | ICD-10-CM | POA: Insufficient documentation

## 2013-03-29 DIAGNOSIS — IMO0001 Reserved for inherently not codable concepts without codable children: Secondary | ICD-10-CM | POA: Insufficient documentation

## 2013-03-29 DIAGNOSIS — M545 Low back pain, unspecified: Secondary | ICD-10-CM | POA: Insufficient documentation

## 2013-03-29 DIAGNOSIS — Z96659 Presence of unspecified artificial knee joint: Secondary | ICD-10-CM | POA: Insufficient documentation

## 2013-03-29 DIAGNOSIS — R5381 Other malaise: Secondary | ICD-10-CM | POA: Insufficient documentation

## 2013-04-03 ENCOUNTER — Ambulatory Visit: Payer: Medicare Other

## 2013-04-05 ENCOUNTER — Ambulatory Visit: Payer: Medicare Other | Admitting: Physical Therapy

## 2013-04-10 ENCOUNTER — Ambulatory Visit: Payer: Medicare Other

## 2013-04-13 ENCOUNTER — Ambulatory Visit: Payer: Medicare Other

## 2013-04-17 ENCOUNTER — Ambulatory Visit: Payer: Medicare Other | Admitting: *Deleted

## 2013-04-20 ENCOUNTER — Ambulatory Visit: Payer: Medicare Other | Attending: Anesthesiology

## 2013-04-20 DIAGNOSIS — M545 Low back pain, unspecified: Secondary | ICD-10-CM | POA: Insufficient documentation

## 2013-04-20 DIAGNOSIS — Z96659 Presence of unspecified artificial knee joint: Secondary | ICD-10-CM | POA: Insufficient documentation

## 2013-04-20 DIAGNOSIS — R293 Abnormal posture: Secondary | ICD-10-CM | POA: Insufficient documentation

## 2013-04-20 DIAGNOSIS — R5381 Other malaise: Secondary | ICD-10-CM | POA: Insufficient documentation

## 2013-04-20 DIAGNOSIS — IMO0001 Reserved for inherently not codable concepts without codable children: Secondary | ICD-10-CM | POA: Insufficient documentation

## 2013-04-24 ENCOUNTER — Ambulatory Visit: Payer: Medicare Other

## 2013-04-27 ENCOUNTER — Ambulatory Visit: Payer: Medicare Other | Admitting: Physical Therapy

## 2013-05-08 ENCOUNTER — Ambulatory Visit: Payer: Medicare Other | Admitting: *Deleted

## 2013-05-11 ENCOUNTER — Ambulatory Visit: Payer: Medicare Other | Admitting: *Deleted

## 2013-05-14 ENCOUNTER — Ambulatory Visit: Payer: Medicare Other | Admitting: Physical Therapy

## 2013-07-02 ENCOUNTER — Other Ambulatory Visit: Payer: Self-pay

## 2013-07-02 DIAGNOSIS — Z1231 Encounter for screening mammogram for malignant neoplasm of breast: Secondary | ICD-10-CM

## 2013-08-02 ENCOUNTER — Ambulatory Visit: Payer: Medicare Other

## 2013-08-14 ENCOUNTER — Ambulatory Visit
Admission: RE | Admit: 2013-08-14 | Discharge: 2013-08-14 | Disposition: A | Discharge status: Home / Self Care | Payer: Medicare Other | Source: Ambulatory Visit

## 2013-08-14 DIAGNOSIS — Z1231 Encounter for screening mammogram for malignant neoplasm of breast: Secondary | ICD-10-CM

## 2014-02-08 ENCOUNTER — Encounter (INDEPENDENT_AMBULATORY_CARE_PROVIDER_SITE_OTHER): Payer: Self-pay | Admitting: *Deleted

## 2014-02-21 ENCOUNTER — Other Ambulatory Visit (INDEPENDENT_AMBULATORY_CARE_PROVIDER_SITE_OTHER): Payer: Self-pay | Admitting: *Deleted

## 2014-02-21 ENCOUNTER — Ambulatory Visit (INDEPENDENT_AMBULATORY_CARE_PROVIDER_SITE_OTHER): Payer: Medicare Other | Admitting: Internal Medicine

## 2014-02-21 ENCOUNTER — Telehealth (INDEPENDENT_AMBULATORY_CARE_PROVIDER_SITE_OTHER): Payer: Self-pay | Admitting: *Deleted

## 2014-02-21 ENCOUNTER — Encounter (INDEPENDENT_AMBULATORY_CARE_PROVIDER_SITE_OTHER): Payer: Self-pay | Admitting: Internal Medicine

## 2014-02-21 VITALS — BP 138/58 | HR 64 | Temp 98.1°F | Ht 63.0 in | Wt 204.9 lb

## 2014-02-21 DIAGNOSIS — Z1211 Encounter for screening for malignant neoplasm of colon: Secondary | ICD-10-CM

## 2014-02-21 DIAGNOSIS — E119 Type 2 diabetes mellitus without complications: Secondary | ICD-10-CM | POA: Insufficient documentation

## 2014-02-21 DIAGNOSIS — R7303 Prediabetes: Secondary | ICD-10-CM | POA: Insufficient documentation

## 2014-02-21 DIAGNOSIS — I10 Essential (primary) hypertension: Secondary | ICD-10-CM | POA: Insufficient documentation

## 2014-02-21 DIAGNOSIS — Z87898 Personal history of other specified conditions: Secondary | ICD-10-CM | POA: Insufficient documentation

## 2014-02-21 DIAGNOSIS — K5900 Constipation, unspecified: Secondary | ICD-10-CM | POA: Insufficient documentation

## 2014-02-21 MED ORDER — PEG-KCL-NACL-NASULF-NA ASC-C 100 G PO SOLR: 1.0000 | Freq: Once | ORAL | Status: DC

## 2014-02-21 NOTE — Telephone Encounter (Signed)
Patient needs movi prep 

## 2014-02-21 NOTE — Progress Notes (Signed)
Subjective:     Patient ID: Susan Haney, female   DOB: 06/04/1932, 80 y.o.   MRN: [id number]  HPI  Referred to our office by Dr. Octavio Graves. She tells me it is time for her colonoscopy. She tells me she took Dilaudid po x 1 week and became constipated. She was taking the Dilaudid in May ffor chronic back pain.  She tells me since stopping she is not as constipated. She is having a BM x 2 a day. Stools are not hard. She is taking 2 stool softeners daily. No melena or BRRB. Appetite has been. No weight loss.  Occasionally has slight abdominal pain. She has frequent back pain.  No family hx of colon cancer.  09/04/2003 Colonoscopy:PHYSICIAN: Nelwyn Salisbury, M.D.   IMPRESSION: Normal colonoscopy to the cecum.  08/24/2013 ALT 11, ALP 47, AST 12, total bili 0.6, HA1c 6.0 H and H 11.9 and 36.6, MCV 81.2, platelet ct 252.     Review of Systems  Past Medical History  Diagnosis Date  . Diabetes     x 15 yrs with good control  . Chronic back pain     Past Surgical History  Procedure Laterality Date  . Foot surgery      Left  . Ovary removed: benign    . Replacement total knee bilateral      2013    No Known Allergies  No current outpatient prescriptions on file prior to visit.   No current facility-administered medications on file prior to visit.        Objective:   Physical Exam  Filed Vitals:   02/21/14 1035  BP: 138/58  Pulse: 64  Temp: 98.1 F (36.7 C)  Height: 5\' 3"  (1.6 m)  Weight: 204 lb 14.4 oz (92.942 kg)   Alert and oriented. Skin warm and dry. Oral mucosa is moist.   . Sclera anicteric, conjunctivae is pink. Thyroid not enlarged. No cervical lymphadenopathy. Lungs clear. Heart regular rate and rhythm.  Abdomen is soft. Bowel sounds are positive. No hepatomegaly. No abdominal masses felt. No tenderness.  No edema to lower extremities.       Assessment:    Recent hx of narcotic induced constipation resolved at this time. In need of screening  colonoscopy.    Plan:     Screening colonoscopy.The risks and benefits such as perforation, bleeding, and infection were reviewed with the patient and is agreeable.

## 2014-03-11 ENCOUNTER — Telehealth (INDEPENDENT_AMBULATORY_CARE_PROVIDER_SITE_OTHER): Payer: Self-pay | Admitting: *Deleted

## 2014-03-11 ENCOUNTER — Encounter (INDEPENDENT_AMBULATORY_CARE_PROVIDER_SITE_OTHER): Payer: Self-pay

## 2014-03-11 NOTE — Telephone Encounter (Signed)
Porschia said when she seen Terri, there was nothing done for her. She has been taking OTC laxatives and Linzess that was given to her by Dr. Melina Copa. Nothing seems to be helping her. Her rectum is getting packed and sore feeling. Please see if the nurse will return her call at: 8-1 at [phone] and 1-until [phone]. Doesn't want to talk with Terri.

## 2014-03-13 NOTE — Telephone Encounter (Signed)
Patient states that she was given Linzess 290 mcg by Karna Christmas on last Friday. She was instructed to go ahead and take 2 by mouth since the stool softeners and taking 1 Linzess had not helped. Taking 2 did nothing and this morning she took just 1 and states that the,"Flood Gates have opened." She has gone several times and says it is diarrhea. The pressure,soreness has gotten better since she started going to bathroom.  This bout of Constipation started in May 2015 , PCP ordered a CT [place], June 2015. Results were negative accept for a Fibro Cyst on Uterus.  She is to have a Colonoscopy on 04/04/14.  Questions:  1 Should she take the Linzess daily now,or every other day?  2 Should she continue to take the stool softeners?

## 2014-03-17 NOTE — Telephone Encounter (Signed)
Patient's call returned. She is now having soft stools. Patient advised to take Linzess every day. Her now she can stay on stool softener. Also needs to take Metamucil or Benefiber 4 g by mouth daily.

## 2014-03-20 ENCOUNTER — Encounter (HOSPITAL_COMMUNITY): Payer: Self-pay | Admitting: Pharmacy Technician

## 2014-04-03 NOTE — OR Nursing (Signed)
Called patient for pre-op call and patient stated that she had a bicuspid aortic valve and needed antibiotics prior to procedure. Dr.Rehman notified and said antibiotics are not recommended.

## 2014-04-04 ENCOUNTER — Ambulatory Visit (HOSPITAL_COMMUNITY)
Admission: RE | Admit: 2014-04-04 | Discharge: 2014-04-04 | Disposition: A | Discharge status: Home / Self Care | Payer: Medicare Other | Source: Ambulatory Visit | Attending: Internal Medicine | Admitting: Internal Medicine

## 2014-04-04 ENCOUNTER — Encounter (HOSPITAL_COMMUNITY): Payer: Self-pay | Admitting: *Deleted

## 2014-04-04 ENCOUNTER — Encounter (HOSPITAL_COMMUNITY)
Admission: RE | Disposition: A | Discharge status: Home / Self Care | Payer: Self-pay | Source: Ambulatory Visit | Attending: Internal Medicine

## 2014-04-04 DIAGNOSIS — K219 Gastro-esophageal reflux disease without esophagitis: Secondary | ICD-10-CM | POA: Insufficient documentation

## 2014-04-04 DIAGNOSIS — Z79899 Other long term (current) drug therapy: Secondary | ICD-10-CM | POA: Diagnosis not present

## 2014-04-04 DIAGNOSIS — Z1211 Encounter for screening for malignant neoplasm of colon: Secondary | ICD-10-CM

## 2014-04-04 DIAGNOSIS — K644 Residual hemorrhoidal skin tags: Secondary | ICD-10-CM

## 2014-04-04 DIAGNOSIS — K5909 Other constipation: Secondary | ICD-10-CM | POA: Diagnosis present

## 2014-04-04 DIAGNOSIS — Z7982 Long term (current) use of aspirin: Secondary | ICD-10-CM | POA: Insufficient documentation

## 2014-04-04 DIAGNOSIS — I10 Essential (primary) hypertension: Secondary | ICD-10-CM | POA: Diagnosis not present

## 2014-04-04 DIAGNOSIS — K5730 Diverticulosis of large intestine without perforation or abscess without bleeding: Secondary | ICD-10-CM | POA: Diagnosis not present

## 2014-04-04 DIAGNOSIS — E119 Type 2 diabetes mellitus without complications: Secondary | ICD-10-CM | POA: Diagnosis not present

## 2014-04-04 DIAGNOSIS — Z791 Long term (current) use of non-steroidal anti-inflammatories (NSAID): Secondary | ICD-10-CM | POA: Insufficient documentation

## 2014-04-04 HISTORY — DX: Anemia, unspecified: D64.9

## 2014-04-04 HISTORY — PX: COLONOSCOPY: SHX5424

## 2014-04-04 HISTORY — DX: Unspecified osteoarthritis, unspecified site: M19.90

## 2014-04-04 HISTORY — DX: Essential (primary) hypertension: I10

## 2014-04-04 HISTORY — DX: Gastro-esophageal reflux disease without esophagitis: K21.9

## 2014-04-04 LAB — GLUCOSE, CAPILLARY: Glucose-Capillary: 110 mg/dL — ABNORMAL HIGH (ref 70–99)

## 2014-04-04 SURGERY — COLONOSCOPY
Anesthesia: Moderate Sedation

## 2014-04-04 MED ORDER — MEPERIDINE HCL 50 MG/ML IJ SOLN
INTRAMUSCULAR | Status: DC
Start: 2014-04-04 — End: 2014-04-04
  Filled 2014-04-04: qty 1

## 2014-04-04 MED ORDER — STERILE WATER FOR IRRIGATION IR SOLN
Status: DC | PRN
  Administered 2014-04-04: 13:00:00

## 2014-04-04 MED ORDER — MEPERIDINE HCL 50 MG/ML IJ SOLN
INTRAMUSCULAR | Status: DC | PRN
  Administered 2014-04-04 (×2): 25 mg via INTRAVENOUS

## 2014-04-04 MED ORDER — LACTULOSE 10 GM/15ML PO SOLN: 20.0000 g | Freq: Two times a day (BID) | ORAL | Status: DC

## 2014-04-04 MED ORDER — SODIUM CHLORIDE 0.9 % IV SOLN
INTRAVENOUS | Status: DC
  Administered 2014-04-04: 12:00:00 via INTRAVENOUS

## 2014-04-04 MED ORDER — MIDAZOLAM HCL 5 MG/5ML IJ SOLN
INTRAMUSCULAR | Status: AC
  Filled 2014-04-04: qty 10

## 2014-04-04 MED ORDER — MIDAZOLAM HCL 5 MG/5ML IJ SOLN
INTRAMUSCULAR | Status: DC | PRN
  Administered 2014-04-04 (×2): 1 mg via INTRAVENOUS
  Administered 2014-04-04: 2 mg via INTRAVENOUS

## 2014-04-04 NOTE — Op Note (Signed)
COLONOSCOPY PROCEDURE REPORT  PATIENT:  Susan Haney  MR#:  [id number] Birthdate:  07-16-1932, 80 y.o., female Endoscopist:  Dr. Rogene Houston, MD Referred By:  Dr. Octavio Graves, DO  Procedure Date: 04/04/2014  Procedure:   Colonoscopy  Indications:  Patient is 80 year old Caucasian female who is here for average risk screening colonoscopy. Her last exam was 10 years ago. She does have chronic constipation which has gotten worse lately.  Informed Consent:  The procedure and risks were reviewed with the patient and informed consent was obtained.  Medications:  Demerol 50 mg IV Versed 4 mg IV  Description of procedure:  After a digital rectal exam was performed, that colonoscope was advanced from the anus through the rectum and colon to the area of the cecum, ileocecal valve and appendiceal orifice. The cecum was deeply intubated. These structures were well-seen and photographed for the record. From the level of the cecum and ileocecal valve, the scope was slowly and cautiously withdrawn. The mucosal surfaces were carefully surveyed utilizing scope tip to flexion to facilitate fold flattening as needed. The scope was pulled down into the rectum where a thorough exam including retroflexion was performed.  Findings:   Prep satisfactory. Single small diverticulum noted at the junction of sigmoid and descending colon. Normal rectal mucosa. Small hemorrhoids below the dentate line.   Therapeutic/Diagnostic Maneuvers Performed:   None  Complications:  None  Cecal Withdrawal Time:  8 minutes  Impression:  Examination performed to cecum. Single left-sided diverticulum and external hemorrhoids otherwise normal colonoscopy.  Recommendations:  Standard instructions given. Continue high fiber diet and Metamucil daily. Lactulose 30 mL by mouth twice a day. Stool diary and office visit in 8 weeks.    Mahati Vajda U  04/04/2014 1:10 PM  CC: Dr. Melina Copa, Caren Griffins, DO & Dr. No ref.  provider found

## 2014-04-04 NOTE — Discharge Instructions (Signed)
Resume usual medications and high fiber diet. Lactulose 20 g or 2 tablespoonful by mouth twice daily. Watch blood glucose level while on lactulose. Keep stool diary for 8 weeks(stool consistency and frequency). Can use Dulcolax suppository every third day on as-needed basis. Please have Dr. Melina Copa to examine you to rule out rectocele. Office visit in 8 weeks   Colonoscopy, Care After Refer to this sheet in the next few weeks. These instructions provide you with information on caring for yourself after your procedure. Your health care provider may also give you more specific instructions. Your treatment has been planned according to current medical practices, but problems sometimes occur. Call your health care provider if you have any problems or questions after your procedure. WHAT TO EXPECT AFTER THE PROCEDURE  After your procedure, it is typical to have the following:  A small amount of blood in your stool.  Moderate amounts of gas and mild abdominal cramping or bloating. HOME CARE INSTRUCTIONS  Do not drive, operate machinery, or sign important documents for 24 hours.  You may shower and resume your regular physical activities, but move at a slower pace for the first 24 hours.  Take frequent rest periods for the first 24 hours.  Walk around or put a warm pack on your abdomen to help reduce abdominal cramping and bloating.  Drink enough fluids to keep your urine clear or pale yellow.  You may resume your normal diet as instructed by your health care provider. Avoid heavy or fried foods that are hard to digest.  Avoid drinking alcohol for 24 hours or as instructed by your health care provider.  Only take over-the-counter or prescription medicines as directed by your health care provider.  If a tissue sample (biopsy) was taken during your procedure:  Do not take aspirin or blood thinners for 7 days, or as instructed by your health care provider.  Do not drink alcohol for 7 days,  or as instructed by your health care provider.  Eat soft foods for the first 24 hours. SEEK MEDICAL CARE IF: You have persistent spotting of blood in your stool 2-3 days after the procedure. SEEK IMMEDIATE MEDICAL CARE IF:  You have more than a small spotting of blood in your stool.  You pass large blood clots in your stool.  Your abdomen is swollen (distended).  You have nausea or vomiting.  You have a fever.  You have increasing abdominal pain that is not relieved with medicine. Document Released: 02/17/2004 Document Revised: 04/25/2013 Document Reviewed: 03/12/2013 [facility] Patient Information 2015 [place], [place]. This information is not intended to replace advice given to you by your health care provider. Make sure you discuss any questions you have with your health care provider.  High-Fiber Diet Fiber is found in fruits, vegetables, and grains. A high-fiber diet encourages the addition of more whole grains, legumes, fruits, and vegetables in your diet. The recommended amount of fiber for adult males is 38 g per day. For adult females, it is 25 g per day. Pregnant and lactating women should get 28 g of fiber per day. If you have a digestive or bowel problem, ask your caregiver for advice before adding high-fiber foods to your diet. Eat a variety of high-fiber foods instead of only a select few type of foods.  PURPOSE  To increase stool bulk.  To make bowel movements more regular to prevent constipation.  To lower cholesterol.  To prevent overeating. WHEN IS THIS DIET USED?  It may be used if  you have constipation and hemorrhoids.  It may be used if you have uncomplicated diverticulosis (intestine condition) and irritable bowel syndrome.  It may be used if you need help with weight management.  It may be used if you want to add it to your diet as a protective measure against atherosclerosis, diabetes, and cancer. SOURCES OF FIBER  Whole-grain breads and  cereals.  Fruits, such as apples, oranges, bananas, berries, prunes, and pears.  Vegetables, such as green peas, carrots, sweet potatoes, beets, broccoli, cabbage, spinach, and artichokes.  Legumes, such split peas, soy, lentils.  Almonds. FIBER CONTENT IN FOODS Starches and Grains / Dietary Fiber (g)  Cheerios, 1 cup / 3 g  Corn Flakes cereal, 1 cup / 0.7 g  Rice crispy treat cereal, 1 cup / 0.3 g  Instant oatmeal (cooked),  cup / 2 g  Frosted wheat cereal, 1 cup / 5.1 g  Brown, long-grain rice (cooked), 1 cup / 3.5 g  White, long-grain rice (cooked), 1 cup / 0.6 g  Enriched macaroni (cooked), 1 cup / 2.5 g Legumes / Dietary Fiber (g)  Baked beans (canned, plain, or vegetarian),  cup / 5.2 g  Kidney beans (canned),  cup / 6.8 g  Pinto beans (cooked),  cup / 5.5 g Breads and Crackers / Dietary Fiber (g)  Plain or honey graham crackers, 2 squares / 0.7 g  Saltine crackers, 3 squares / 0.3 g  Plain, salted pretzels, 10 pieces / 1.8 g  Whole-wheat bread, 1 slice / 1.9 g  White bread, 1 slice / 0.7 g  Raisin bread, 1 slice / 1.2 g  Plain bagel, 3 oz / 2 g  Flour tortilla, 1 oz / 0.9 g  Corn tortilla, 1 small / 1.5 g  Hamburger or hotdog bun, 1 small / 0.9 g Fruits / Dietary Fiber (g)  Apple with skin, 1 medium / 4.4 g  Sweetened applesauce,  cup / 1.5 g  Banana,  medium / 1.5 g  Grapes, 10 grapes / 0.4 g  Orange, 1 small / 2.3 g  Raisin, 1.5 oz / 1.6 g  Melon, 1 cup / 1.4 g Vegetables / Dietary Fiber (g)  Green beans (canned),  cup / 1.3 g  Carrots (cooked),  cup / 2.3 g  Broccoli (cooked),  cup / 2.8 g  Peas (cooked),  cup / 4.4 g  Mashed potatoes,  cup / 1.6 g  Lettuce, 1 cup / 0.5 g  Corn (canned),  cup / 1.6 g  Tomato,  cup / 1.1 g Document Released: 07/05/2005 Document Revised: 01/04/2012 Document Reviewed: 10/07/2011 [employer] Patient Information 2015 [place], [place]. This information is not intended to replace  advice given to you by your health care provider. Make sure you discuss any questions you have with your health care provider.

## 2014-04-04 NOTE — H&P (Signed)
Susan Haney is an 80 y.o. female.   Chief Complaint: Patient is here for colonoscopy. HPI: Patient is a year-old Caucasian female who is here for colonoscopy for screening purposes. She says she's had constipation all of her life since she was given pain medication for back problems and has gotten worse. Is trying multiple medications but does not have desired result including Linzess she tried recently. At times she feels stones in the rectum and would not come out. She denies rectal bleeding anorexia or weight loss. Last colonoscopy was about 10 years ago. She is on  naproxen usually twice a day and reports no side effects. Family history is negative for CRC.  Past Medical History  Diagnosis Date  . Diabetes     x 15 yrs with good control  . Chronic back pain   . Hypertension   . GERD (gastroesophageal reflux disease)   . Headache(784.0)   . Arthritis   . Anemia     history ,  took iron for years    Past Surgical History  Procedure Laterality Date  . Foot surgery      Left  . Ovary removed: benign    . Replacement total knee bilateral      2013    Family History  Problem Relation Age of Onset  . Diabetes Maternal Grandmother    Social History:  reports that she has never smoked. She does not have any smokeless tobacco history on file. She reports that she does not drink alcohol or use illicit drugs.  Allergies: No Known Allergies  Medications Prior to Admission  Medication Sig Dispense Refill  . aspirin 81 MG tablet Take 81 mg by mouth daily.      . cholecalciferol (VITAMIN D) 400 UNITS TABS tablet Take 2,000 Units by mouth.      . docusate sodium (COLACE) 100 MG capsule Take 100 mg by mouth 2 (two) times daily.      Marland Kitchen gabapentin (NEURONTIN) 100 MG capsule Take 100 mg by mouth at bedtime.      Astrid Drafts Omega-3 300 MG CAPS Take by mouth every morning.      Marland Kitchen lisinopril-hydrochlorothiazide (PRINZIDE,ZESTORETIC) 20-12.5 MG per tablet Take 1 tablet by mouth daily.       . metFORMIN (GLUMETZA) 500 MG (MOD) 24 hr tablet Take 500 mg by mouth every evening.      . Multiple Vitamins-Minerals (CENTRUM SILVER ADULT 50+) TABS Take by mouth.      . naproxen (NAPROSYN) 500 MG tablet Take 500 mg by mouth as needed.      Marland Kitchen omeprazole (PRILOSEC) 20 MG capsule Take 20 mg by mouth as needed.      . Probiotic Product ([place]) Take by mouth.      . sitaGLIPtin (JANUVIA) 100 MG tablet Take 100 mg by mouth daily.        Results for orders placed during the hospital encounter of 04/04/14 (from the past 48 hour(s))  GLUCOSE, CAPILLARY     Status: Abnormal   Collection Time    04/04/14 12:20 PM      Result Value Ref Range   Glucose-Capillary 110 (*) 70 - 99 mg/dL   No results found.  ROS  Blood pressure 168/78, pulse 63, temperature 98.5 F (36.9 C), temperature source Oral, resp. rate 18, height 5\' 3"  (1.6 m), weight 204 lb (92.534 kg), SpO2 95.00%. Physical Exam  Constitutional: She appears well-developed and well-nourished.  HENT:  Mouth/Throat: Oropharynx is  clear and moist.  Eyes: Conjunctivae are normal. No scleral icterus.  Neck: No thyromegaly present.  Cardiovascular: Normal rate, regular rhythm and normal heart sounds.   No murmur heard. Respiratory: Effort normal and breath sounds normal.  GI: Soft. She exhibits no distension. There is no tenderness.  Musculoskeletal: She exhibits no edema.  Lymphadenopathy:    She has no cervical adenopathy.  Neurological: She is alert.  Skin: Skin is warm and dry.     Assessment/Plan Average risk screening colonoscopy. Chronic constipation.  Sorcha Rotunno U 04/04/2014, 12:35 PM

## 2014-04-05 ENCOUNTER — Encounter (HOSPITAL_COMMUNITY): Payer: Self-pay | Admitting: Internal Medicine

## 2014-06-11 ENCOUNTER — Ambulatory Visit (INDEPENDENT_AMBULATORY_CARE_PROVIDER_SITE_OTHER): Payer: Medicare Other | Admitting: Internal Medicine

## 2014-06-18 ENCOUNTER — Ambulatory Visit (INDEPENDENT_AMBULATORY_CARE_PROVIDER_SITE_OTHER): Payer: Medicare Other | Admitting: Internal Medicine

## 2014-06-18 ENCOUNTER — Encounter (INDEPENDENT_AMBULATORY_CARE_PROVIDER_SITE_OTHER): Payer: Self-pay | Admitting: Internal Medicine

## 2014-06-18 VITALS — BP 128/72 | HR 68 | Temp 97.2°F | Resp 18 | Ht 63.0 in | Wt 205.6 lb

## 2014-06-18 DIAGNOSIS — G8929 Other chronic pain: Secondary | ICD-10-CM

## 2014-06-18 DIAGNOSIS — M545 Low back pain, unspecified: Secondary | ICD-10-CM

## 2014-06-18 DIAGNOSIS — K219 Gastro-esophageal reflux disease without esophagitis: Secondary | ICD-10-CM

## 2014-06-18 DIAGNOSIS — K5909 Other constipation: Secondary | ICD-10-CM

## 2014-06-18 DIAGNOSIS — K5900 Constipation, unspecified: Secondary | ICD-10-CM

## 2014-06-18 MED ORDER — OMEPRAZOLE 20 MG PO CPDR: 20.0000 mg | DELAYED_RELEASE_CAPSULE | Freq: Every day | ORAL | Status: DC

## 2014-06-18 NOTE — Patient Instructions (Signed)
Can take Tylenol 500 mg up to 4 times a day as needed. Do not take other NSAIDs while on low-dose aspirin and naproxen. Notify if you have tarry stools and abdominal pain. These have Dr. Melina Copa examine you to rule out rectocele

## 2014-06-18 NOTE — Progress Notes (Signed)
Presenting complaint;  Follow-up for chronic constipation.  Subjective:  Patient is 80 year old Caucasian female with long-standing constipation which got worse after she took pain medication for back pain. She recalls pain medications never helped her back pain resulted in severe constipation that she went one week without bowel movement. She underwent colonoscopy in September 2015 revealing single left-sided diverticulum and external hemorrhoids. Patient was begun on lactulose and asked keep stool diary she is here for scheduled visit. She says she is doing much better. She has kept stool diary for the last 68 days. She had normal bowel movement on 27 days. She had 6 days without a bowel movement and on rest of the days she had small amounts of stool. She has noted excessive flatus without pain. She remains with good appetite. She denies melena or rectal bleeding. She does strain at times. Continues to complain of back pain. She is not having any side effects with aspirin and naproxen. She is taking omeprazole for heartburn on as-needed basis.    Current Medications: Outpatient Encounter Prescriptions as of 06/18/2014  Medication Sig  . aspirin 81 MG tablet Take 81 mg by mouth daily.  . cholecalciferol (VITAMIN D) 400 UNITS TABS tablet Take 2,000 Units by mouth.  . docusate sodium (COLACE) 100 MG capsule Take 100 mg by mouth 2 (two) times daily.  Marland Kitchen gabapentin (NEURONTIN) 100 MG capsule Take 100 mg by mouth at bedtime.  Astrid Drafts Omega-3 300 MG CAPS Take by mouth every morning.  . lactulose (CHRONULAC) 10 GM/15ML solution Take 30 mLs (20 g total) by mouth 2 (two) times daily.  Marland Kitchen lisinopril-hydrochlorothiazide (PRINZIDE,ZESTORETIC) 20-12.5 MG per tablet Take 1 tablet by mouth daily.  . metFORMIN (GLUMETZA) 500 MG (MOD) 24 hr tablet Take 500 mg by mouth every evening.  . Multiple Vitamins-Minerals (CENTRUM SILVER ADULT 50+) TABS Take by mouth.  . naproxen (NAPROSYN) 500 MG tablet Take 500 mg  by mouth as needed.  Marland Kitchen omeprazole (PRILOSEC) 20 MG capsule Take 20 mg by mouth as needed.  . Probiotic Product ([place]) Take by mouth.  . sitaGLIPtin (JANUVIA) 100 MG tablet Take 100 mg by mouth daily.    Objective: Blood pressure 128/72, pulse 68, temperature 97.2 F (36.2 C), temperature source Oral, resp. rate 18, height 5\' 3"  (1.6 m), weight 205 lb 9.6 oz (93.26 kg). Patient is alert and in no acute distress. Conjunctiva is pink. Sclera is nonicteric Oropharyngeal mucosa is normal. No neck masses or thyromegaly noted. Cardiac exam with regular rhythm normal S1 and S2. No murmur or gallop noted. Lungs are clear to auscultation. Abdomen is full with normal bowel sounds. It is soft and nontender without organomegaly or masses.  No LE edema or clubbing noted.   Assessment:  #1. Chronic constipation most likely secondary to colonic dysmotility. She is doing much better with lactulose and high fiber diet. She is still having to strain some in with the stool is soft and therefore rectocele needs to be ruled out. #2. GERD. Symptoms controlled with when necessary omeprazole #3. Patient on chronic NSAID therapy. She is also on low-dose aspirin. She is at increased risk for peptic ulcer disease. She therefore should take omeprazole daily.   Plan:  Take omeprazole 20 mg by mouth every morning. Have Dr. Melina Copa examine you to rule out rectocele. Call if you have abdominal pain or melena. Office visit in 1 year.

## 2014-10-08 ENCOUNTER — Other Ambulatory Visit: Payer: Self-pay | Admitting: Sports Medicine

## 2014-10-08 DIAGNOSIS — M5136 Other intervertebral disc degeneration, lumbar region: Secondary | ICD-10-CM

## 2014-11-01 ENCOUNTER — Ambulatory Visit
Admission: RE | Admit: 2014-11-01 | Discharge: 2014-11-01 | Disposition: A | Discharge status: Home / Self Care | Payer: Medicare Other | Source: Ambulatory Visit | Attending: Sports Medicine | Admitting: Sports Medicine

## 2014-11-01 DIAGNOSIS — M5136 Other intervertebral disc degeneration, lumbar region: Secondary | ICD-10-CM

## 2015-02-19 ENCOUNTER — Encounter (INDEPENDENT_AMBULATORY_CARE_PROVIDER_SITE_OTHER): Payer: Self-pay | Admitting: *Deleted

## 2015-06-19 ENCOUNTER — Ambulatory Visit (INDEPENDENT_AMBULATORY_CARE_PROVIDER_SITE_OTHER): Payer: Medicare Other | Admitting: Internal Medicine

## 2015-06-30 ENCOUNTER — Ambulatory Visit (INDEPENDENT_AMBULATORY_CARE_PROVIDER_SITE_OTHER): Payer: Medicare Other | Admitting: Internal Medicine

## 2015-06-30 ENCOUNTER — Encounter (INDEPENDENT_AMBULATORY_CARE_PROVIDER_SITE_OTHER): Payer: Self-pay | Admitting: Internal Medicine

## 2015-06-30 VITALS — BP 130/50 | HR 76 | Temp 97.7°F | Ht 64.0 in | Wt 206.0 lb

## 2015-06-30 DIAGNOSIS — K5909 Other constipation: Secondary | ICD-10-CM

## 2015-06-30 DIAGNOSIS — K219 Gastro-esophageal reflux disease without esophagitis: Secondary | ICD-10-CM | POA: Insufficient documentation

## 2015-06-30 NOTE — Progress Notes (Signed)
Subjective:    Patient ID: Susan Haney, female    DOB: April 11, 1932, 81 y.o.   MRN: [id number]  HPI Here today for f/u of her chronic constipation (narcotic induced). She was last seen by Dr. Laural Golden in December of 2015. She tells me she is taking [employer] for her constipation. She says this has really helped. She tells me she is having a BM 1-2 a day. No melena or BRRB.  She occasionally misses a day without having a BM.  Appetite is good. No weight loss. She has maintained her weight.  She works at the [employer] and she climbs the steps daily.   She has been upset over her son being in hospital for fluid overload. Her acid reflux controlled with omeprazole.  She has chronic back pain and takes spinal injections.     04/04/2014 Colonoscopy  Indications: Patient is 81 year old Caucasian female who is here for average risk screening colonoscopy. Her last exam was 10 years ago. She does have chronic constipation which has gotten worse lately. Examination performed to cecum. Single left-sided diverticulum and external hemorrhoids otherwise normal colonoscopy.   Review of Systems Past Medical History  Diagnosis Date  . Diabetes ([place])     x 15 yrs with good control  . Chronic back pain   . Hypertension   . GERD (gastroesophageal reflux disease)   . Headache(784.0)   . Arthritis   . Anemia     history ,  took iron for years    Past Surgical History  Procedure Laterality Date  . Foot surgery      Left  . Ovary removed: benign    . Replacement total knee bilateral      2013  . Colonoscopy N/A 04/04/2014    Procedure: COLONOSCOPY;  Surgeon: Rogene Houston, MD;  Location: [facility];  Service: Endoscopy;  Laterality: N/A;  100    No Known Allergies  Current Outpatient Prescriptions on File Prior to Visit  Medication Sig Dispense Refill  . aspirin 81 MG tablet Take 81 mg by mouth daily.    . cholecalciferol (VITAMIN D) 400 UNITS TABS tablet Take 2,000 Units  by mouth.    . docusate sodium (COLACE) 100 MG capsule Take 100 mg by mouth 2 (two) times daily.    Marland Kitchen gabapentin (NEURONTIN) 100 MG capsule Take 100 mg by mouth at bedtime.    Astrid Drafts Omega-3 300 MG CAPS Take by mouth every morning.    Marland Kitchen lisinopril-hydrochlorothiazide (PRINZIDE,ZESTORETIC) 20-12.5 MG per tablet Take 1 tablet by mouth daily.    . metFORMIN (GLUMETZA) 500 MG (MOD) 24 hr tablet Take 500 mg by mouth every evening.    . Multiple Vitamins-Minerals (CENTRUM SILVER ADULT 50+) TABS Take by mouth.    . naproxen (NAPROSYN) 500 MG tablet Take 500 mg by mouth as needed.    Marland Kitchen omeprazole (PRILOSEC) 20 MG capsule Take 1 capsule (20 mg total) by mouth daily. 90 capsule 3  . Probiotic Product ([place]) Take by mouth.    . sitaGLIPtin (JANUVIA) 100 MG tablet Take 100 mg by mouth daily.    Marland Kitchen lactulose (CHRONULAC) 10 GM/15ML solution Take 30 mLs (20 g total) by mouth 2 (two) times daily. (Patient not taking: Reported on 06/30/2015) 1892 mL 0   No current facility-administered medications on file prior to visit.        Objective:   Physical ExamBlood pressure 130/50, pulse 76, temperature 97.7 F (36.5 C), height  5\' 4"  (1.626 m), weight 206 lb (93.441 kg). Alert and oriented. Skin warm and dry. Oral mucosa is moist.   . Sclera anicteric, conjunctivae is pink. Thyroid not enlarged. No cervical lymphadenopathy. Lungs clear. Heart regular rate and rhythm.  Abdomen is soft. Bowel sounds are positive. No hepatomegaly. No abdominal masses felt. No tenderness.  No edema to lower extremities.           Assessment & Plan:   #1. Chronic constipation most likely secondary to colonic dysmotility. She is doing much better with lactulose and high fiber diet. She is still having to strain some in with the stool is soft and therefore rectocele needs to be ruled out. #2. GERD. Symptoms controlled with when necessary omeprazole

## 2015-06-30 NOTE — Patient Instructions (Signed)
Continue the Omeprazole. Continue present medications for constipation. OV 1 year.

## 2015-07-11 ENCOUNTER — Other Ambulatory Visit (INDEPENDENT_AMBULATORY_CARE_PROVIDER_SITE_OTHER): Payer: Self-pay | Admitting: Internal Medicine

## 2015-07-20 DIAGNOSIS — C801 Malignant (primary) neoplasm, unspecified: Secondary | ICD-10-CM

## 2015-07-20 HISTORY — DX: Malignant (primary) neoplasm, unspecified: C80.1

## 2015-10-08 ENCOUNTER — Other Ambulatory Visit: Payer: Self-pay | Admitting: Physician Assistant

## 2015-11-13 ENCOUNTER — Other Ambulatory Visit: Payer: Self-pay | Admitting: Physician Assistant

## 2016-02-04 ENCOUNTER — Telehealth: Payer: Self-pay

## 2016-02-04 NOTE — Telephone Encounter (Signed)
Pt called with confusion over the 2 addresses, [address] and [address]. Clarified for pt and gave her general driving directions.

## 2016-02-06 ENCOUNTER — Ambulatory Visit: Payer: Medicare Other | Attending: Gynecology | Admitting: Gynecology

## 2016-02-06 ENCOUNTER — Encounter: Payer: Self-pay | Admitting: Gynecology

## 2016-02-06 VITALS — BP 149/69 | HR 77 | Temp 98.8°F | Resp 18 | Ht 64.0 in | Wt 206.5 lb

## 2016-02-06 DIAGNOSIS — D649 Anemia, unspecified: Secondary | ICD-10-CM | POA: Diagnosis not present

## 2016-02-06 DIAGNOSIS — C541 Malignant neoplasm of endometrium: Secondary | ICD-10-CM

## 2016-02-06 DIAGNOSIS — I10 Essential (primary) hypertension: Secondary | ICD-10-CM | POA: Insufficient documentation

## 2016-02-06 DIAGNOSIS — M4646 Discitis, unspecified, lumbar region: Secondary | ICD-10-CM | POA: Diagnosis not present

## 2016-02-06 DIAGNOSIS — Z96653 Presence of artificial knee joint, bilateral: Secondary | ICD-10-CM | POA: Insufficient documentation

## 2016-02-06 DIAGNOSIS — G8929 Other chronic pain: Secondary | ICD-10-CM | POA: Diagnosis not present

## 2016-02-06 DIAGNOSIS — Z833 Family history of diabetes mellitus: Secondary | ICD-10-CM | POA: Diagnosis not present

## 2016-02-06 DIAGNOSIS — E119 Type 2 diabetes mellitus without complications: Secondary | ICD-10-CM | POA: Insufficient documentation

## 2016-02-06 DIAGNOSIS — R51 Headache: Secondary | ICD-10-CM | POA: Diagnosis not present

## 2016-02-06 DIAGNOSIS — M810 Age-related osteoporosis without current pathological fracture: Secondary | ICD-10-CM | POA: Diagnosis not present

## 2016-02-06 DIAGNOSIS — R600 Localized edema: Secondary | ICD-10-CM | POA: Diagnosis not present

## 2016-02-06 DIAGNOSIS — Z7984 Long term (current) use of oral hypoglycemic drugs: Secondary | ICD-10-CM | POA: Diagnosis not present

## 2016-02-06 DIAGNOSIS — E669 Obesity, unspecified: Secondary | ICD-10-CM | POA: Insufficient documentation

## 2016-02-06 DIAGNOSIS — N950 Postmenopausal bleeding: Secondary | ICD-10-CM | POA: Insufficient documentation

## 2016-02-06 DIAGNOSIS — K219 Gastro-esophageal reflux disease without esophagitis: Secondary | ICD-10-CM | POA: Insufficient documentation

## 2016-02-06 DIAGNOSIS — M79605 Pain in left leg: Secondary | ICD-10-CM

## 2016-02-06 DIAGNOSIS — I38 Endocarditis, valve unspecified: Secondary | ICD-10-CM | POA: Insufficient documentation

## 2016-02-06 DIAGNOSIS — M1990 Unspecified osteoarthritis, unspecified site: Secondary | ICD-10-CM | POA: Diagnosis not present

## 2016-02-06 DIAGNOSIS — Z7982 Long term (current) use of aspirin: Secondary | ICD-10-CM | POA: Diagnosis not present

## 2016-02-06 NOTE — Patient Instructions (Addendum)
Preparing for your Surgery  Plan to have a doppler of your left lower extremity to rule out DVT or blood clot on Monday, July 24 at [facility] for surgery on February 17, 2016 with Dr. Nancy Marus at [facility] will be scheduled for a robotic assisted total hysterectomy, bilateral salpingo-oophorectomy, sentinel lymph node biopsy.  Do not take aspirin until advised to resume after surgery.  Pre-operative Testing -You will receive a phone call from presurgical testing at [facility] to arrange for a pre-operative testing appointment before your surgery.  This appointment normally occurs one to two weeks before your scheduled surgery.   -Bring your insurance card, copy of an advanced directive if applicable, medication list  -At that visit, you will be asked to sign a consent for a possible blood transfusion in case a transfusion becomes necessary during surgery.  The need for a blood transfusion is rare but having consent is a necessary part of your care.     -You should not be taking blood thinners or aspirin at least ten days prior to surgery unless instructed by your surgeon.  Day Before Surgery at [facility] will be asked to take in a light diet the day before surgery.  Avoid carbonated beverages.  You will be advised to have nothing to eat or drink after midnight the evening before.     Eat a light diet the day before surgery.  Examples including soups, broths, toast, yogurt, mashed potatoes.  Things to avoid include carbonated beverages (fizzy beverages), raw fruits and raw vegetables, or beans.    If your bowels are filled with gas, your surgeon will have difficulty visualizing your pelvic organs which increases your surgical risks.  Your role in recovery Your role is to become active as soon as directed by your doctor, while still giving yourself time to heal.  Rest when you feel tired. You will be asked to do the following in order  to speed your recovery:  - Cough and breathe deeply. This helps toclear and expand your lungs and can prevent pneumonia. You may be given a spirometer to practice deep breathing. A staff member will show you how to use the spirometer. - Do mild physical activity. Walking or moving your legs help your circulation and body functions return to normal. A staff member will help you when you try to walk and will provide you with simple exercises. Do not try to get up or walk alone the first time. - Actively manage your pain. Managing your pain lets you move in comfort. We will ask you to rate your pain on a scale of zero to 10. It is your responsibility to tell your doctor or nurse where and how much you hurt so your pain can be treated.  Special Considerations -If you are diabetic, you may be placed on insulin after surgery to have closer control over your blood sugars to promote healing and recovery.  This does not mean that you will be discharged on insulin.  If applicable, your oral antidiabetics will be resumed when you are tolerating a solid diet.  -Your final pathology results from surgery should be available by the Friday after surgery and the results will be relayed to you when available.  Blood Transfusion Information WHAT IS A BLOOD TRANSFUSION? A transfusion is the replacement of blood or some of its parts. Blood is made up of multiple cells which provide different functions.  Red blood cells carry  oxygen and are used for blood loss replacement.  White blood cells fight against infection.  Platelets control bleeding.  Plasma helps clot blood.  Other blood products are available for specialized needs, such as hemophilia or other clotting disorders. BEFORE THE TRANSFUSION  Who gives blood for transfusions?   You may be able to donate blood to be used at a later date on yourself (autologous donation).  Relatives can be asked to donate blood. This is generally not any safer than if you  have received blood from a stranger. The same precautions are taken to ensure safety when a relative's blood is donated.  Healthy volunteers who are fully evaluated to make sure their blood is safe. This is blood bank blood. Transfusion therapy is the safest it has ever been in the practice of medicine. Before blood is taken from a donor, a complete history is taken to make sure that person has no history of diseases nor engages in risky social behavior (examples are intravenous drug use or sexual activity with multiple partners). The donor's travel history is screened to minimize risk of transmitting infections, such as malaria. The donated blood is tested for signs of infectious diseases, such as HIV and hepatitis. The blood is then tested to be sure it is compatible with you in order to minimize the chance of a transfusion reaction. If you or a relative donates blood, this is often done in anticipation of surgery and is not appropriate for emergency situations. It takes many days to process the donated blood. RISKS AND COMPLICATIONS Although transfusion therapy is very safe and saves many lives, the main dangers of transfusion include:   Getting an infectious disease.  Developing a transfusion reaction. This is an allergic reaction to something in the blood you were given. Every precaution is taken to prevent this. The decision to have a blood transfusion has been considered carefully by your caregiver before blood is given. Blood is not given unless the benefits outweigh the risks.

## 2016-02-06 NOTE — Progress Notes (Signed)
Consult Note: Gyn-Onc   Susan Haney 82 y.o. female  Chief Complaint  Patient presents with  . Endometrial cancer    New consultation    Assessment :Endometrial adenocarcinoma (grade 1-2), pain and edema of the left lower extremity.  Plan: I recommend the patient undergo a robotic hysterectomy salpingo-oophorectomy and possible staging based on intraoperative frozen section. Risks of surgery were reviewed. We will plan surgery on 02/17/2016. She understands and Dr. Ned Clines will be the primary surgeon.  We'll obtain an ultrasound of her left more extremity to rule out deep vein thrombosis.  HPI: The patient had onset of postmenopausal bleeding approximately a month ago. This was not associated with any pain or other symptoms. An endometrial biopsy was obtained showing a grade 1-2 endometrial adenocarcinoma. Pelvic ultrasound showed the uterus to measure 7 x 4.4 x 5.6 cm with 2 small fibroids. The left ovary surgically absent right ovary appeared normal there is no free fluid.  Today patient is having some vaginal bleeding. She also notes some increased edema and pain in her left lower extremity below the knee.  The patient has a past history of a benign left ovarian cyst which was removed laparoscopically. The patient underwent menopause at age 65. Pap smears are normal  Obstetrical history gravida 2 para 2  Comorbidities include diabetes valvular heart disease osteoporosis lumbar disc disease and obesity.  Review of Systems:10 point review of systems is negative except as noted in interval history.   Vitals: Blood pressure 149/69, pulse 77, temperature 98.8 F (37.1 C), temperature source Oral, resp. rate 18, height 5\' 4"  (1.626 m), weight 206 lb 8 oz (93.668 kg), SpO2 97 %.  Physical Exam: General : The patient is a healthy woman in no acute distress.  HEENT: normocephalic, extraoccular movements normal; neck is supple without thyromegally  Lynphnodes: Supraclavicular and  inguinal nodes not enlarged  Abdomen: Soft, non-tender, no ascites, no organomegally, no masses, no hernias  Pelvic:  EGBUS: Normal female (atrophic) Vagina: Normal, atrophic, no lesions  Urethra and Bladder: Normal, non-tender  Cervix: Normal no lesions are noted Uterus: Retroverted normal shape size and consistency  Bi-manual examination: Non-tender; no adenxal masses or nodularity  Rectal: normal sphincter tone, no masses, no blood  Lower extremities: Both legs have mild edema but the left is worse and there is erythema in the left leg below the knee. This is slightly tender to palpation as well.      No Known Allergies  Past Medical History  Diagnosis Date  . Diabetes ([place])     x 15 yrs with good control  . Chronic back pain   . Hypertension   . GERD (gastroesophageal reflux disease)   . Headache(784.0)   . Arthritis   . Anemia     history ,  took iron for years    Past Surgical History  Procedure Laterality Date  . Foot surgery      Left  . Ovary removed: benign    . Replacement total knee bilateral      2013  . Colonoscopy N/A 04/04/2014    Procedure: COLONOSCOPY;  Surgeon: Rogene Houston, MD;  Location: [facility];  Service: Endoscopy;  Laterality: N/A;  100    Current Outpatient Prescriptions  Medication Sig Dispense Refill  . aspirin 81 MG tablet Take 81 mg by mouth daily.    . cholecalciferol (VITAMIN D) 400 UNITS TABS tablet Take 2,000 Units by mouth.    . docusate sodium (COLACE) 100 MG  capsule Take 100 mg by mouth 2 (two) times daily.    Marland Kitchen gabapentin (NEURONTIN) 100 MG capsule Take 100 mg by mouth at bedtime.    Astrid Drafts Omega-3 300 MG CAPS Take by mouth every morning.    Marland Kitchen losartan-hydrochlorothiazide (HYZAAR) 100-12.5 MG tablet Take 1 tablet by mouth daily.  3  . metFORMIN (GLUMETZA) 500 MG (MOD) 24 hr tablet Take 500 mg by mouth every evening.    . Multiple Vitamins-Minerals (CENTRUM SILVER ADULT 50+) TABS Take by mouth.    . naproxen  (NAPROSYN) 500 MG tablet Take 500 mg by mouth as needed.    Marland Kitchen omeprazole (PRILOSEC) 20 MG capsule TAKE 1 CAPSULE (20 MG TOTAL) BY MOUTH DAILY. 90 capsule 3  . Probiotic Product ([place]) Take by mouth.    . sitaGLIPtin (JANUVIA) 100 MG tablet Take 100 mg by mouth daily.     No current facility-administered medications for this visit.    Social History   Social History  . Marital Status: Widowed    Spouse Name: N/A  . Number of Children: N/A  . Years of Education: N/A   Occupational History  . Not on file.   Social History Main Topics  . Smoking status: Never Smoker   . Smokeless tobacco: Never Used  . Alcohol Use: No  . Drug Use: No  . Sexual Activity: Not Currently   Other Topics Concern  . Not on file   Social History Narrative    Family History  Problem Relation Age of Onset  . Diabetes Maternal Grandmother       Marti Sleigh, MD 02/06/2016, 11:13 AM

## 2016-02-09 ENCOUNTER — Ambulatory Visit (HOSPITAL_BASED_OUTPATIENT_CLINIC_OR_DEPARTMENT_OTHER)
Admission: RE | Admit: 2016-02-09 | Discharge: 2016-02-09 | Disposition: A | Discharge status: Home / Self Care | Payer: Medicare Other | Source: Ambulatory Visit | Attending: Gynecologic Oncology | Admitting: Gynecologic Oncology

## 2016-02-09 DIAGNOSIS — R600 Localized edema: Secondary | ICD-10-CM | POA: Diagnosis not present

## 2016-02-09 DIAGNOSIS — M79652 Pain in left thigh: Secondary | ICD-10-CM | POA: Diagnosis not present

## 2016-02-09 DIAGNOSIS — M79605 Pain in left leg: Secondary | ICD-10-CM

## 2016-02-09 DIAGNOSIS — M9712XA Periprosthetic fracture around internal prosthetic left knee joint, initial encounter: Secondary | ICD-10-CM | POA: Diagnosis not present

## 2016-02-09 NOTE — Progress Notes (Signed)
VASCULAR LAB PRELIMINARY  PRELIMINARY  PRELIMINARY  PRELIMINARY  Left lower extremity venous duplex has been completed.     Left:  No evidence of DVT, superficial thrombosis, or Baker's cyst.  Called with the results, spoke with Robin,RN.  Susan Haney, RVT, RDMS 02/09/2016, 10:50 AM

## 2016-02-11 ENCOUNTER — Emergency Department (HOSPITAL_COMMUNITY): Payer: Medicare Other

## 2016-02-11 ENCOUNTER — Encounter (HOSPITAL_COMMUNITY): Payer: Self-pay | Admitting: Emergency Medicine

## 2016-02-11 ENCOUNTER — Inpatient Hospital Stay (HOSPITAL_COMMUNITY)
Admission: EM | Admit: 2016-02-11 | Discharge: 2016-02-16 | DRG: 467 | Disposition: A | Discharge status: Skilled Nursing Facility | Payer: Medicare Other | Attending: Family Medicine | Admitting: Family Medicine

## 2016-02-11 DIAGNOSIS — K219 Gastro-esophageal reflux disease without esophagitis: Secondary | ICD-10-CM | POA: Diagnosis present

## 2016-02-11 DIAGNOSIS — Z23 Encounter for immunization: Secondary | ICD-10-CM

## 2016-02-11 DIAGNOSIS — E669 Obesity, unspecified: Secondary | ICD-10-CM | POA: Diagnosis not present

## 2016-02-11 DIAGNOSIS — D62 Acute posthemorrhagic anemia: Secondary | ICD-10-CM | POA: Diagnosis not present

## 2016-02-11 DIAGNOSIS — Z6836 Body mass index (BMI) 36.0-36.9, adult: Secondary | ICD-10-CM | POA: Diagnosis not present

## 2016-02-11 DIAGNOSIS — Z96653 Presence of artificial knee joint, bilateral: Secondary | ICD-10-CM | POA: Diagnosis present

## 2016-02-11 DIAGNOSIS — M9712XA Periprosthetic fracture around internal prosthetic left knee joint, initial encounter: Secondary | ICD-10-CM | POA: Diagnosis not present

## 2016-02-11 DIAGNOSIS — Z7984 Long term (current) use of oral hypoglycemic drugs: Secondary | ICD-10-CM

## 2016-02-11 DIAGNOSIS — I493 Ventricular premature depolarization: Secondary | ICD-10-CM | POA: Diagnosis not present

## 2016-02-11 DIAGNOSIS — Z833 Family history of diabetes mellitus: Secondary | ICD-10-CM | POA: Diagnosis not present

## 2016-02-11 DIAGNOSIS — M25562 Pain in left knee: Secondary | ICD-10-CM | POA: Diagnosis present

## 2016-02-11 DIAGNOSIS — S72492A Other fracture of lower end of left femur, initial encounter for closed fracture: Secondary | ICD-10-CM | POA: Diagnosis not present

## 2016-02-11 DIAGNOSIS — E119 Type 2 diabetes mellitus without complications: Secondary | ICD-10-CM | POA: Diagnosis not present

## 2016-02-11 DIAGNOSIS — J9811 Atelectasis: Secondary | ICD-10-CM | POA: Diagnosis not present

## 2016-02-11 DIAGNOSIS — R058 Other specified cough: Secondary | ICD-10-CM

## 2016-02-11 DIAGNOSIS — C541 Malignant neoplasm of endometrium: Secondary | ICD-10-CM | POA: Diagnosis present

## 2016-02-11 DIAGNOSIS — W19XXXA Unspecified fall, initial encounter: Secondary | ICD-10-CM

## 2016-02-11 DIAGNOSIS — G8929 Other chronic pain: Secondary | ICD-10-CM | POA: Diagnosis present

## 2016-02-11 DIAGNOSIS — M545 Low back pain: Secondary | ICD-10-CM | POA: Diagnosis present

## 2016-02-11 DIAGNOSIS — M5416 Radiculopathy, lumbar region: Secondary | ICD-10-CM | POA: Diagnosis not present

## 2016-02-11 DIAGNOSIS — Z7982 Long term (current) use of aspirin: Secondary | ICD-10-CM

## 2016-02-11 DIAGNOSIS — S72402A Unspecified fracture of lower end of left femur, initial encounter for closed fracture: Secondary | ICD-10-CM | POA: Diagnosis present

## 2016-02-11 DIAGNOSIS — Z79899 Other long term (current) drug therapy: Secondary | ICD-10-CM

## 2016-02-11 DIAGNOSIS — R509 Fever, unspecified: Secondary | ICD-10-CM

## 2016-02-11 DIAGNOSIS — R05 Cough: Secondary | ICD-10-CM

## 2016-02-11 DIAGNOSIS — D72829 Elevated white blood cell count, unspecified: Secondary | ICD-10-CM | POA: Diagnosis not present

## 2016-02-11 DIAGNOSIS — W010XXA Fall on same level from slipping, tripping and stumbling without subsequent striking against object, initial encounter: Secondary | ICD-10-CM | POA: Diagnosis present

## 2016-02-11 DIAGNOSIS — R7303 Prediabetes: Secondary | ICD-10-CM

## 2016-02-11 DIAGNOSIS — IMO0002 Reserved for concepts with insufficient information to code with codable children: Secondary | ICD-10-CM | POA: Diagnosis present

## 2016-02-11 DIAGNOSIS — M79652 Pain in left thigh: Secondary | ICD-10-CM | POA: Diagnosis present

## 2016-02-11 DIAGNOSIS — I10 Essential (primary) hypertension: Secondary | ICD-10-CM | POA: Diagnosis present

## 2016-02-11 DIAGNOSIS — S7292XA Unspecified fracture of left femur, initial encounter for closed fracture: Secondary | ICD-10-CM

## 2016-02-11 DIAGNOSIS — Z87898 Personal history of other specified conditions: Secondary | ICD-10-CM

## 2016-02-11 DIAGNOSIS — Z09 Encounter for follow-up examination after completed treatment for conditions other than malignant neoplasm: Secondary | ICD-10-CM

## 2016-02-11 LAB — CBC WITH DIFFERENTIAL/PLATELET
Basophils Absolute: 0 10*3/uL (ref 0.0–0.1)
Basophils Relative: 0 %
EOS PCT: 1 %
Eosinophils Absolute: 0.1 10*3/uL (ref 0.0–0.7)
HEMATOCRIT: 31.4 % — AB (ref 36.0–46.0)
Hemoglobin: 10 g/dL — ABNORMAL LOW (ref 12.0–15.0)
LYMPHS ABS: 1.7 10*3/uL (ref 0.7–4.0)
Lymphocytes Relative: 12 %
MCH: 27.5 pg (ref 26.0–34.0)
MCHC: 31.8 g/dL (ref 30.0–36.0)
MCV: 86.5 fL (ref 78.0–100.0)
MONO ABS: 0.7 10*3/uL (ref 0.1–1.0)
MONOS PCT: 5 %
NEUTROS ABS: 11.3 10*3/uL — AB (ref 1.7–7.7)
Neutrophils Relative %: 82 %
Platelets: 283 10*3/uL (ref 150–400)
RBC: 3.63 MIL/uL — ABNORMAL LOW (ref 3.87–5.11)
RDW: 14.4 % (ref 11.5–15.5)
WBC: 13.8 10*3/uL — ABNORMAL HIGH (ref 4.0–10.5)

## 2016-02-11 LAB — COMPREHENSIVE METABOLIC PANEL
ALT: 12 U/L — ABNORMAL LOW (ref 14–54)
ANION GAP: 7 (ref 5–15)
AST: 14 U/L — ABNORMAL LOW (ref 15–41)
Albumin: 3.4 g/dL — ABNORMAL LOW (ref 3.5–5.0)
Alkaline Phosphatase: 42 U/L (ref 38–126)
BILIRUBIN TOTAL: 0.4 mg/dL (ref 0.3–1.2)
BUN: 23 mg/dL — ABNORMAL HIGH (ref 6–20)
CO2: 24 mmol/L (ref 22–32)
Calcium: 9.1 mg/dL (ref 8.9–10.3)
Chloride: 109 mmol/L (ref 101–111)
Creatinine, Ser: 0.96 mg/dL (ref 0.44–1.00)
GFR, EST NON AFRICAN AMERICAN: 54 mL/min — AB (ref >60)
Glucose, Bld: 123 mg/dL — ABNORMAL HIGH (ref 65–99)
POTASSIUM: 3.9 mmol/L (ref 3.5–5.1)
Sodium: 140 mmol/L (ref 135–145)
TOTAL PROTEIN: 6.5 g/dL (ref 6.5–8.1)

## 2016-02-11 LAB — ABO/RH: ABO/RH(D): O NEG

## 2016-02-11 MED ORDER — OXYCODONE-ACETAMINOPHEN 5-325 MG PO TABS
1.0000 | ORAL_TABLET | Freq: Once | ORAL | Status: AC
  Administered 2016-02-11: 1 via ORAL
  Filled 2016-02-11: qty 1

## 2016-02-11 MED ORDER — TETANUS-DIPHTH-ACELL PERTUSSIS 5-2.5-18.5 LF-MCG/0.5 IM SUSP
0.5000 mL | Freq: Once | INTRAMUSCULAR | Status: AC
  Administered 2016-02-11: 0.5 mL via INTRAMUSCULAR
  Filled 2016-02-11: qty 0.5

## 2016-02-11 MED ORDER — ONDANSETRON HCL 4 MG/2ML IJ SOLN
4.0000 mg | Freq: Once | INTRAMUSCULAR | Status: AC
  Administered 2016-02-11: 4 mg via INTRAVENOUS
  Filled 2016-02-11: qty 2

## 2016-02-11 MED ORDER — MORPHINE SULFATE (PF) 4 MG/ML IV SOLN
4.0000 mg | Freq: Once | INTRAVENOUS | Status: AC
  Administered 2016-02-11: 4 mg via INTRAVENOUS
  Filled 2016-02-11: qty 1

## 2016-02-11 NOTE — ED Notes (Signed)
Patient transported to X-ray 

## 2016-02-11 NOTE — ED Triage Notes (Signed)
Patient here with complaints of left knee pain and swelling after a fall at church today. Pain 4/10.

## 2016-02-11 NOTE — ED Provider Notes (Signed)
[facility] DEPT Provider Note   CSN: [id number] Arrival date & time: 02/11/16  1839  First Provider Contact:  First MD Initiated Contact with Patient 02/11/16 1914        History   Chief Complaint Chief Complaint  Patient presents with  . Fall    HPI Susan Haney is a 82 y.o. female.  HPI   Patient is a 82 year old female who has had a prosthesis done on her left knee. She tripped over something at church and had a mechanical fall onto left knee. She does not remember who replaced her knee as it was 15 years ago. Did not strike head. Did not injure in any other way. Not up-to-date on tetanus.  Past Medical History:  Diagnosis Date  . Anemia    history ,  took iron for years  . Arthritis   . Chronic back pain   . Diabetes ([place])    x 15 yrs with good control  . GERD (gastroesophageal reflux disease)   . Headache(784.0)   . Hypertension     Patient Active Problem List   Diagnosis Date Noted  . GERD (gastroesophageal reflux disease) 06/30/2015  . Chronic lower back pain 06/18/2014  . Diabetes ([place]) 02/21/2014  . Essential hypertension, benign 02/21/2014  . Unspecified constipation 02/21/2014    Past Surgical History:  Procedure Laterality Date  . COLONOSCOPY N/A 04/04/2014   Procedure: COLONOSCOPY;  Surgeon: Rogene Houston, MD;  Location: [facility];  Service: Endoscopy;  Laterality: N/A;  100  . FOOT SURGERY     Left  . Ovary removed: benign    . REPLACEMENT TOTAL KNEE BILATERAL     2013    OB History    No data available       Home Medications    Prior to Admission medications   Medication Sig Start Date End Date Taking? Authorizing Provider  ASPERCREME LIDOCAINE EX Apply topically daily as needed.   Yes Historical Provider, MD  aspirin 81 MG tablet Take 81 mg by mouth daily.   Yes Historical Provider, MD  cholecalciferol (VITAMIN D) 400 UNITS TABS tablet Take 2,000 Units by mouth.   Yes Historical Provider, MD  docusate sodium (COLACE)  100 MG capsule Take 100 mg by mouth 2 (two) times daily.   Yes Historical Provider, MD  metFORMIN (GLUMETZA) 500 MG (MOD) 24 hr tablet Take 500 mg by mouth every evening.   Yes Historical Provider, MD  Multiple Vitamins-Minerals (CENTRUM SILVER ADULT 50+) TABS Take 1 tablet by mouth daily.    Yes Historical Provider, MD  naproxen (NAPROSYN) 500 MG tablet Take 500 mg by mouth as needed.   Yes Historical Provider, MD  Omega-3 Fatty Acids (OMEGA 3 500 PO) Take 1 tablet by mouth.    Yes Historical Provider, MD  omeprazole (PRILOSEC) 20 MG capsule TAKE 1 CAPSULE (20 MG TOTAL) BY MOUTH DAILY. 07/15/15  Yes Rogene Houston, MD  Probiotic Product (PHILLIPS COLON HEALTH PO) Take 1 tablet by mouth.    Yes Historical Provider, MD  sitaGLIPtin (JANUVIA) 100 MG tablet Take 100 mg by mouth daily.   Yes Historical Provider, MD  losartan-hydrochlorothiazide (HYZAAR) 100-12.5 MG tablet Take 1 tablet by mouth daily. 01/16/16   Historical Provider, MD    Family History Family History  Problem Relation Age of Onset  . Diabetes Maternal Grandmother     Social History Social History  Substance Use Topics  . Smoking status: Never Smoker  . Smokeless tobacco: Never Used  .  Alcohol use No     Allergies   Review of patient's allergies indicates no known allergies.   Review of Systems Review of Systems  Constitutional: Negative for activity change.  Respiratory: Negative for shortness of breath.   Cardiovascular: Negative for chest pain.  Gastrointestinal: Negative for abdominal pain.  Musculoskeletal: Positive for back pain and gait problem.  All other systems reviewed and are negative.    Physical Exam Updated Vital Signs BP 128/63   Pulse 82   Temp 98.2 F (36.8 C)   Resp 17   SpO2 98%   Physical Exam  Constitutional: She appears well-developed and well-nourished. No distress.  HENT:  Head: Normocephalic and atraumatic.  Eyes: Conjunctivae are normal.  Neck: Neck supple.    Cardiovascular: Normal rate and regular rhythm.   No murmur heard. Pulmonary/Chest: Effort normal and breath sounds normal. No respiratory distress.  Abdominal: Soft. There is no tenderness.  Musculoskeletal: She exhibits no edema.  Left knee with extensive swelling, abrasion. Patient has ecchymosis to the left knee. Unable to do full range of motion of left knee. Able to rotate left hip without pain. Positive pulses. Positive sensation.  Neurological: She is alert.  Skin: Skin is warm and dry.  Psychiatric: She has a normal mood and affect.  Nursing note and vitals reviewed.    ED Treatments / Results  Labs (all labs ordered are listed, but only abnormal results are displayed) Labs Reviewed  CBC WITH DIFFERENTIAL/PLATELET - Abnormal; Notable for the following:       Result Value   WBC 13.8 (*)    RBC 3.63 (*)    Hemoglobin 10.0 (*)    HCT 31.4 (*)    Neutro Abs 11.3 (*)    All other components within normal limits  COMPREHENSIVE METABOLIC PANEL - Abnormal; Notable for the following:    Glucose, Bld 123 (*)    BUN 23 (*)    Albumin 3.4 (*)    AST 14 (*)    ALT 12 (*)    GFR calc non Af Amer 54 (*)    All other components within normal limits  TYPE AND SCREEN  ABO/RH    EKG  EKG Interpretation None       Radiology Dg Knee Complete 4 Views Left  Result Date: 02/11/2016 CLINICAL DATA:  Status post fall. EXAM: LEFT KNEE - COMPLETE 4+ VIEW COMPARISON:  None. FINDINGS: Left total knee arthroplasty. Comminuted fracture of the distal femoral metaphysis just above the arthroplasty with 2 cm of posterior displacement and 2.5 cm of lateral displacement. No other fracture or dislocation. No aggressive lytic or sclerotic osseous lesion. Soft tissue contusion along the anterolateral aspect of the left knee. IMPRESSION: 1. Comminuted and displaced fracture of the distal left femoral metaphysis just above the left total knee arthroplasty. Electronically Signed   By: Kathreen Devoid    On: 02/11/2016 20:09   Procedures Procedures (including critical care time)  Medications Ordered in ED Medications  oxyCODONE-acetaminophen (PERCOCET/ROXICET) 5-325 MG per tablet 1 tablet (1 tablet Oral Given 02/11/16 1951)  Tdap (BOOSTRIX) injection 0.5 mL (0.5 mLs Intramuscular Given 02/11/16 2045)  morphine 4 MG/ML injection 4 mg (4 mg Intravenous Given 02/11/16 2203)  ondansetron (ZOFRAN) injection 4 mg (4 mg Intravenous Given 02/11/16 2201)     Initial Impression / Assessment and Plan / ED Course  I have reviewed the triage vital signs and the nursing notes.  Pertinent labs & imaging results that were available during my care of  the patient were reviewed by me and considered in my medical decision making (see chart for details).  Clinical Course  Comment By Time  Repaged coverage for Dr. Charlyn Minerva, MD 07/26 2140   Patient is a 82 year old female with mechanical fall. Patient's got normal pulses normal sensation distally. She has pain with movement. No pain with rotation of left hip or ankle.   Suspicious for fracture to left knee. We'll get plain films pain control and tetanus. \ Fracture seen. Put in page to ortho (Dr. Lorre Nick did left knee prior)   Swinteck at bedside. (Coverage for Dr. Binnie Rail..not dr. Lorre Nick)  11:09 PM S Called back on call for Dr. Lorre Nick. Awaiting page back.  11:26 PM Repaged coverage for Dr. Lorre Nick.   11:41 PM Dr. Lorre Nick coverage, Dr. Percell Miller says he would like to use the on call physician. Will call back [place] since he has already seen the patietn and she would like to work with him.    Final Clinical Impressions(s) / ED Diagnoses   Final diagnoses:  Fall  Closed fracture of left distal femur, initial encounter    New Prescriptions New Prescriptions   No medications on file     Felicita Nuncio Julio Alm, MD 02/11/16 [phone]

## 2016-02-12 ENCOUNTER — Inpatient Hospital Stay (HOSPITAL_COMMUNITY): Payer: Medicare Other

## 2016-02-12 ENCOUNTER — Encounter (HOSPITAL_COMMUNITY): Payer: Self-pay | Admitting: Certified Registered Nurse Anesthetist

## 2016-02-12 ENCOUNTER — Encounter (HOSPITAL_COMMUNITY)
Admission: EM | Disposition: A | Discharge status: Skilled Nursing Facility | Payer: Self-pay | Source: Home / Self Care | Attending: Family Medicine

## 2016-02-12 ENCOUNTER — Other Ambulatory Visit: Payer: Self-pay

## 2016-02-12 ENCOUNTER — Inpatient Hospital Stay (HOSPITAL_COMMUNITY): Payer: Medicare Other | Admitting: Certified Registered Nurse Anesthetist

## 2016-02-12 DIAGNOSIS — Z96653 Presence of artificial knee joint, bilateral: Secondary | ICD-10-CM | POA: Diagnosis present

## 2016-02-12 DIAGNOSIS — S72492A Other fracture of lower end of left femur, initial encounter for closed fracture: Secondary | ICD-10-CM | POA: Diagnosis present

## 2016-02-12 DIAGNOSIS — I10 Essential (primary) hypertension: Secondary | ICD-10-CM | POA: Diagnosis present

## 2016-02-12 DIAGNOSIS — Z79899 Other long term (current) drug therapy: Secondary | ICD-10-CM | POA: Diagnosis not present

## 2016-02-12 DIAGNOSIS — D72829 Elevated white blood cell count, unspecified: Secondary | ICD-10-CM | POA: Diagnosis not present

## 2016-02-12 DIAGNOSIS — J9811 Atelectasis: Secondary | ICD-10-CM | POA: Diagnosis not present

## 2016-02-12 DIAGNOSIS — M79652 Pain in left thigh: Secondary | ICD-10-CM | POA: Diagnosis present

## 2016-02-12 DIAGNOSIS — S72402A Unspecified fracture of lower end of left femur, initial encounter for closed fracture: Secondary | ICD-10-CM | POA: Diagnosis not present

## 2016-02-12 DIAGNOSIS — M5416 Radiculopathy, lumbar region: Secondary | ICD-10-CM | POA: Diagnosis present

## 2016-02-12 DIAGNOSIS — C541 Malignant neoplasm of endometrium: Secondary | ICD-10-CM | POA: Diagnosis present

## 2016-02-12 DIAGNOSIS — Z23 Encounter for immunization: Secondary | ICD-10-CM | POA: Diagnosis not present

## 2016-02-12 DIAGNOSIS — W010XXA Fall on same level from slipping, tripping and stumbling without subsequent striking against object, initial encounter: Secondary | ICD-10-CM | POA: Diagnosis present

## 2016-02-12 DIAGNOSIS — E669 Obesity, unspecified: Secondary | ICD-10-CM | POA: Diagnosis present

## 2016-02-12 DIAGNOSIS — Z833 Family history of diabetes mellitus: Secondary | ICD-10-CM | POA: Diagnosis not present

## 2016-02-12 DIAGNOSIS — D62 Acute posthemorrhagic anemia: Secondary | ICD-10-CM | POA: Diagnosis not present

## 2016-02-12 DIAGNOSIS — I493 Ventricular premature depolarization: Secondary | ICD-10-CM | POA: Diagnosis not present

## 2016-02-12 DIAGNOSIS — Z7982 Long term (current) use of aspirin: Secondary | ICD-10-CM | POA: Diagnosis not present

## 2016-02-12 DIAGNOSIS — E119 Type 2 diabetes mellitus without complications: Secondary | ICD-10-CM | POA: Diagnosis present

## 2016-02-12 DIAGNOSIS — S82892A Other fracture of left lower leg, initial encounter for closed fracture: Secondary | ICD-10-CM

## 2016-02-12 DIAGNOSIS — K219 Gastro-esophageal reflux disease without esophagitis: Secondary | ICD-10-CM | POA: Diagnosis present

## 2016-02-12 DIAGNOSIS — M545 Low back pain: Secondary | ICD-10-CM | POA: Diagnosis present

## 2016-02-12 DIAGNOSIS — Z7984 Long term (current) use of oral hypoglycemic drugs: Secondary | ICD-10-CM | POA: Diagnosis not present

## 2016-02-12 DIAGNOSIS — M25562 Pain in left knee: Secondary | ICD-10-CM | POA: Diagnosis present

## 2016-02-12 DIAGNOSIS — G8929 Other chronic pain: Secondary | ICD-10-CM | POA: Diagnosis present

## 2016-02-12 DIAGNOSIS — IMO0002 Reserved for concepts with insufficient information to code with codable children: Secondary | ICD-10-CM | POA: Diagnosis present

## 2016-02-12 DIAGNOSIS — Z6836 Body mass index (BMI) 36.0-36.9, adult: Secondary | ICD-10-CM | POA: Diagnosis not present

## 2016-02-12 DIAGNOSIS — M9712XA Periprosthetic fracture around internal prosthetic left knee joint, initial encounter: Secondary | ICD-10-CM | POA: Diagnosis present

## 2016-02-12 HISTORY — PX: TOTAL KNEE REVISION: SHX996

## 2016-02-12 LAB — CBC
HCT: 28.1 % — ABNORMAL LOW (ref 36.0–46.0)
Hemoglobin: 8.8 g/dL — ABNORMAL LOW (ref 12.0–15.0)
MCH: 27.1 pg (ref 26.0–34.0)
MCHC: 31.3 g/dL (ref 30.0–36.0)
MCV: 86.5 fL (ref 78.0–100.0)
PLATELETS: 279 10*3/uL (ref 150–400)
RBC: 3.25 MIL/uL — ABNORMAL LOW (ref 3.87–5.11)
RDW: 14.5 % (ref 11.5–15.5)
WBC: 10.8 10*3/uL — ABNORMAL HIGH (ref 4.0–10.5)

## 2016-02-12 LAB — CBC WITH DIFFERENTIAL/PLATELET
BASOS ABS: 0 10*3/uL (ref 0.0–0.1)
Basophils Relative: 0 %
EOS PCT: 3 %
Eosinophils Absolute: 0.3 10*3/uL (ref 0.0–0.7)
HCT: 27.1 % — ABNORMAL LOW (ref 36.0–46.0)
HEMOGLOBIN: 8.6 g/dL — AB (ref 12.0–15.0)
Lymphocytes Relative: 20 %
Lymphs Abs: 1.8 10*3/uL (ref 0.7–4.0)
MCH: 27.6 pg (ref 26.0–34.0)
MCHC: 31.7 g/dL (ref 30.0–36.0)
MCV: 86.9 fL (ref 78.0–100.0)
MONOS PCT: 10 %
Monocytes Absolute: 0.9 10*3/uL (ref 0.1–1.0)
Neutro Abs: 6 10*3/uL (ref 1.7–7.7)
Neutrophils Relative %: 67 %
PLATELETS: 266 10*3/uL (ref 150–400)
RBC: 3.12 MIL/uL — AB (ref 3.87–5.11)
RDW: 14.8 % (ref 11.5–15.5)
WBC: 9 10*3/uL (ref 4.0–10.5)

## 2016-02-12 LAB — GLUCOSE, CAPILLARY
GLUCOSE-CAPILLARY: 99 mg/dL (ref 65–99)
GLUCOSE-CAPILLARY: 119 mg/dL — AB (ref 65–99)

## 2016-02-12 LAB — SURGICAL PCR SCREEN
MRSA, PCR: NEGATIVE
Staphylococcus aureus: NEGATIVE

## 2016-02-12 SURGERY — OPEN REDUCTION INTERNAL FIXATION (ORIF) PERIPROSTHETIC FRACTURE
Anesthesia: Choice | Laterality: Left

## 2016-02-12 SURGERY — TOTAL KNEE REVISION
Anesthesia: General | Site: Knee | Laterality: Left

## 2016-02-12 MED ORDER — SODIUM CHLORIDE 0.9 % IJ SOLN
INTRAMUSCULAR | Status: AC
  Filled 2016-02-12: qty 50

## 2016-02-12 MED ORDER — KETOROLAC TROMETHAMINE 30 MG/ML IJ SOLN
INTRAMUSCULAR | Status: AC
  Filled 2016-02-12: qty 1

## 2016-02-12 MED ORDER — KETOROLAC TROMETHAMINE 30 MG/ML IJ SOLN
INTRAMUSCULAR | Status: DC | PRN
  Administered 2016-02-12: 30 mg

## 2016-02-12 MED ORDER — HYDROMORPHONE HCL 1 MG/ML IJ SOLN: 0.2500 mg | INTRAMUSCULAR | Status: DC | PRN

## 2016-02-12 MED ORDER — DEXAMETHASONE SODIUM PHOSPHATE 10 MG/ML IJ SOLN
INTRAMUSCULAR | Status: DC | PRN
  Administered 2016-02-12: 10 mg via INTRAVENOUS

## 2016-02-12 MED ORDER — INSULIN ASPART 100 UNIT/ML ~~LOC~~ SOLN
0.0000 [IU] | Freq: Four times a day (QID) | SUBCUTANEOUS | Status: DC
  Administered 2016-02-13 (×2): 1 [IU] via SUBCUTANEOUS
  Administered 2016-02-14: 2 [IU] via SUBCUTANEOUS
  Administered 2016-02-14: 1 [IU] via SUBCUTANEOUS
  Administered 2016-02-14 – 2016-02-15 (×2): 2 [IU] via SUBCUTANEOUS
  Administered 2016-02-15: 3 [IU] via SUBCUTANEOUS
  Filled 2016-02-12: qty 1

## 2016-02-12 MED ORDER — SUCCINYLCHOLINE CHLORIDE 20 MG/ML IJ SOLN
INTRAMUSCULAR | Status: DC | PRN
  Administered 2016-02-12: 100 mg via INTRAVENOUS

## 2016-02-12 MED ORDER — SODIUM CHLORIDE 0.9 % IR SOLN
Status: DC | PRN
  Administered 2016-02-12: 4000 mL

## 2016-02-12 MED ORDER — ROPIVACAINE HCL 5 MG/ML IJ SOLN
INTRAMUSCULAR | Status: AC
  Filled 2016-02-12: qty 30

## 2016-02-12 MED ORDER — FENTANYL CITRATE (PF) 250 MCG/5ML IJ SOLN
INTRAMUSCULAR | Status: AC
  Filled 2016-02-12: qty 5

## 2016-02-12 MED ORDER — POVIDONE-IODINE 10 % EX SWAB
2.0000 "application " | Freq: Once | CUTANEOUS | Status: AC
  Administered 2016-02-12: 2 via TOPICAL

## 2016-02-12 MED ORDER — HYDROCHLOROTHIAZIDE 12.5 MG PO CAPS
12.5000 mg | ORAL_CAPSULE | Freq: Every day | ORAL | Status: DC
  Administered 2016-02-14 – 2016-02-16 (×3): 12.5 mg via ORAL
  Filled 2016-02-12 (×4): qty 1

## 2016-02-12 MED ORDER — HYDROMORPHONE HCL 1 MG/ML IJ SOLN
0.2500 mg | INTRAMUSCULAR | Status: DC | PRN
Start: 2016-02-12 — End: 2016-02-13

## 2016-02-12 MED ORDER — PANTOPRAZOLE SODIUM 40 MG PO TBEC
40.0000 mg | DELAYED_RELEASE_TABLET | Freq: Every day | ORAL | Status: DC
Start: 2016-02-12 — End: 2016-02-16
  Administered 2016-02-13 – 2016-02-16 (×4): 40 mg via ORAL
  Filled 2016-02-12 (×4): qty 1

## 2016-02-12 MED ORDER — HYDROGEN PEROXIDE 3 % EX SOLN
CUTANEOUS | Status: AC
  Filled 2016-02-12: qty 473

## 2016-02-12 MED ORDER — POVIDONE-IODINE 10 % EX SOLN
CUTANEOUS | Status: DC | PRN
  Administered 2016-02-12: 1 via TOPICAL

## 2016-02-12 MED ORDER — BUPIVACAINE-EPINEPHRINE 0.25% -1:200000 IJ SOLN
INTRAMUSCULAR | Status: DC | PRN
  Administered 2016-02-12: 30 mL

## 2016-02-12 MED ORDER — ROCURONIUM BROMIDE 100 MG/10ML IV SOLN
INTRAVENOUS | Status: AC
  Filled 2016-02-12: qty 1

## 2016-02-12 MED ORDER — PROPOFOL 10 MG/ML IV BOLUS
INTRAVENOUS | Status: AC
Start: 2016-02-12 — End: 2016-02-12
  Filled 2016-02-12: qty 20

## 2016-02-12 MED ORDER — ROPIVACAINE HCL 5 MG/ML IJ SOLN
INTRAMUSCULAR | Status: DC | PRN
  Administered 2016-02-12: 150 mg via PERINEURAL

## 2016-02-12 MED ORDER — PROMETHAZINE HCL 25 MG/ML IJ SOLN
6.2500 mg | INTRAMUSCULAR | Status: DC | PRN
Start: 2016-02-12 — End: 2016-02-13

## 2016-02-12 MED ORDER — FENTANYL CITRATE (PF) 100 MCG/2ML IJ SOLN
INTRAMUSCULAR | Status: DC | PRN
  Administered 2016-02-12 (×3): 50 ug via INTRAVENOUS
  Administered 2016-02-12: 100 ug via INTRAVENOUS
  Administered 2016-02-12 (×5): 50 ug via INTRAVENOUS

## 2016-02-12 MED ORDER — HEPARIN SODIUM (PORCINE) 5000 UNIT/ML IJ SOLN: 5000.0000 [IU] | INTRAMUSCULAR | Status: DC

## 2016-02-12 MED ORDER — PROPOFOL 10 MG/ML IV BOLUS
INTRAVENOUS | Status: DC | PRN
  Administered 2016-02-12: 10 mg via INTRAVENOUS
  Administered 2016-02-12: 150 mg via INTRAVENOUS

## 2016-02-12 MED ORDER — POVIDONE-IODINE 10 % EX SWAB: 2.0000 "application " | Freq: Once | CUTANEOUS | Status: DC

## 2016-02-12 MED ORDER — VANCOMYCIN HCL IN DEXTROSE 1-5 GM/200ML-% IV SOLN
INTRAVENOUS | Status: AC
  Filled 2016-02-12: qty 200

## 2016-02-12 MED ORDER — VANCOMYCIN HCL IN DEXTROSE 1-5 GM/200ML-% IV SOLN
1000.0000 mg | INTRAVENOUS | Status: AC
  Administered 2016-02-12: 1000 mg via INTRAVENOUS
  Filled 2016-02-12: qty 200

## 2016-02-12 MED ORDER — TRANEXAMIC ACID 1000 MG/10ML IV SOLN
1000.0000 mg | INTRAVENOUS | Status: AC
  Administered 2016-02-12: 1000 mg via INTRAVENOUS
  Filled 2016-02-12 (×2): qty 10

## 2016-02-12 MED ORDER — DOCUSATE SODIUM 100 MG PO CAPS: 100.0000 mg | ORAL_CAPSULE | Freq: Two times a day (BID) | ORAL | Status: DC

## 2016-02-12 MED ORDER — OXYCODONE HCL 5 MG/5ML PO SOLN
5.0000 mg | Freq: Once | ORAL | Status: DC | PRN
  Filled 2016-02-12: qty 5

## 2016-02-12 MED ORDER — CHLORHEXIDINE GLUCONATE 4 % EX LIQD: 60.0000 mL | Freq: Once | CUTANEOUS | Status: DC

## 2016-02-12 MED ORDER — CEFAZOLIN SODIUM-DEXTROSE 2-4 GM/100ML-% IV SOLN
INTRAVENOUS | Status: AC
Start: 2016-02-12 — End: 2016-02-12
  Filled 2016-02-12: qty 100

## 2016-02-12 MED ORDER — OXYCODONE HCL 5 MG PO TABS: 5.0000 mg | ORAL_TABLET | Freq: Once | ORAL | Status: DC | PRN

## 2016-02-12 MED ORDER — HYDROMORPHONE HCL 2 MG/ML IJ SOLN
INTRAMUSCULAR | Status: AC
  Filled 2016-02-12: qty 1

## 2016-02-12 MED ORDER — ONDANSETRON HCL 4 MG/2ML IJ SOLN
INTRAMUSCULAR | Status: DC | PRN
  Administered 2016-02-12: 4 mg via INTRAVENOUS

## 2016-02-12 MED ORDER — ENOXAPARIN SODIUM 40 MG/0.4ML ~~LOC~~ SOLN: 40.0000 mg | SUBCUTANEOUS | Status: DC

## 2016-02-12 MED ORDER — ONDANSETRON HCL 4 MG/2ML IJ SOLN
INTRAMUSCULAR | Status: AC
  Filled 2016-02-12: qty 2

## 2016-02-12 MED ORDER — LOSARTAN POTASSIUM-HCTZ 100-12.5 MG PO TABS: 1.0000 | ORAL_TABLET | Freq: Every day | ORAL | Status: DC

## 2016-02-12 MED ORDER — SODIUM CHLORIDE 0.9 % IJ SOLN
INTRAMUSCULAR | Status: DC | PRN
  Administered 2016-02-12: 30 mL

## 2016-02-12 MED ORDER — ACETAMINOPHEN 160 MG/5ML PO SOLN: 325.0000 mg | ORAL | Status: DC | PRN

## 2016-02-12 MED ORDER — CEFAZOLIN SODIUM-DEXTROSE 2-4 GM/100ML-% IV SOLN: 2.0000 g | INTRAVENOUS | Status: DC

## 2016-02-12 MED ORDER — LIDOCAINE HCL (CARDIAC) 20 MG/ML IV SOLN
INTRAVENOUS | Status: AC
  Filled 2016-02-12: qty 5

## 2016-02-12 MED ORDER — DEXAMETHASONE SODIUM PHOSPHATE 10 MG/ML IJ SOLN
INTRAMUSCULAR | Status: AC
  Filled 2016-02-12: qty 1

## 2016-02-12 MED ORDER — LIDOCAINE HCL (CARDIAC) 20 MG/ML IV SOLN
INTRAVENOUS | Status: DC | PRN
  Administered 2016-02-12: 100 mg via INTRAVENOUS

## 2016-02-12 MED ORDER — MORPHINE SULFATE (PF) 2 MG/ML IV SOLN
2.0000 mg | INTRAVENOUS | Status: DC | PRN
  Administered 2016-02-12 (×3): 2 mg via INTRAVENOUS
  Filled 2016-02-12 (×3): qty 1

## 2016-02-12 MED ORDER — HYDROGEN PEROXIDE 3 % EX SOLN
CUTANEOUS | Status: DC | PRN
  Administered 2016-02-12: 1

## 2016-02-12 MED ORDER — GLYCOPYRROLATE 0.2 MG/ML IJ SOLN
INTRAMUSCULAR | Status: DC | PRN
  Administered 2016-02-12: 0.2 mg via INTRAVENOUS

## 2016-02-12 MED ORDER — LOSARTAN POTASSIUM 50 MG PO TABS
100.0000 mg | ORAL_TABLET | Freq: Every day | ORAL | Status: DC
  Administered 2016-02-14 – 2016-02-16 (×3): 100 mg via ORAL
  Filled 2016-02-12 (×4): qty 2

## 2016-02-12 MED ORDER — EPHEDRINE SULFATE 50 MG/ML IJ SOLN
INTRAMUSCULAR | Status: DC | PRN
  Administered 2016-02-12: 10 mg via INTRAVENOUS

## 2016-02-12 MED ORDER — LACTATED RINGERS IV SOLN
INTRAVENOUS | Status: DC | PRN
  Administered 2016-02-12 (×4): via INTRAVENOUS

## 2016-02-12 MED ORDER — 0.9 % SODIUM CHLORIDE (POUR BTL) OPTIME
TOPICAL | Status: DC | PRN
  Administered 2016-02-12: 1000 mL

## 2016-02-12 MED ORDER — HYDROMORPHONE HCL 1 MG/ML IJ SOLN
INTRAMUSCULAR | Status: DC | PRN
  Administered 2016-02-12 (×4): 0.5 mg via INTRAVENOUS

## 2016-02-12 MED ORDER — EPHEDRINE SULFATE 50 MG/ML IJ SOLN
INTRAMUSCULAR | Status: AC
  Filled 2016-02-12: qty 1

## 2016-02-12 MED ORDER — ACETAMINOPHEN 325 MG PO TABS: 325.0000 mg | ORAL_TABLET | ORAL | Status: DC | PRN

## 2016-02-12 MED ORDER — BUPIVACAINE-EPINEPHRINE (PF) 0.25% -1:200000 IJ SOLN
INTRAMUSCULAR | Status: AC
  Filled 2016-02-12: qty 30

## 2016-02-12 MED ORDER — CEFAZOLIN SODIUM-DEXTROSE 2-4 GM/100ML-% IV SOLN
2.0000 g | INTRAVENOUS | Status: AC
  Administered 2016-02-12: 2 g via INTRAVENOUS

## 2016-02-12 SURGICAL SUPPLY — 87 items
AXLE ORTHOPEDIC SALVAGE SYSTEM (Knees) ×2 IMPLANT
AXLE TIB LFRIC INTFC KN OSS (Knees) ×1 IMPLANT
BAG SPEC THK2 15X12 ZIP CLS (MISCELLANEOUS)
BAG ZIPLOCK 12X15 (MISCELLANEOUS) IMPLANT
BANDAGE ACE 6X5 VEL STRL LF (GAUZE/BANDAGES/DRESSINGS) ×3 IMPLANT
BANDAGE ESMARK 6X9 LF (GAUZE/BANDAGES/DRESSINGS) ×1 IMPLANT
BEARING TIBIAL OSS ARCOM 12MM (Knees) IMPLANT
BLADE SAW SGTL 13.0X1.19X90.0M (BLADE) ×3 IMPLANT
BLADE SAW SGTL 81X20 HD (BLADE) ×3 IMPLANT
BNDG CMPR 9X6 STRL LF SNTH (GAUZE/BANDAGES/DRESSINGS) ×1
BNDG ESMARK 6X9 LF (GAUZE/BANDAGES/DRESSINGS) ×3
BONE CEMENT PALACOS R-G (Orthopedic Implant) ×18 IMPLANT
BRNG TIB STD 12 STRL KN (Knees) ×1 IMPLANT
BRUSH FEMORAL CANAL (MISCELLANEOUS) ×2 IMPLANT
BUSHING TIBIAL OSS ARCOM (Knees) ×2 IMPLANT
CEMENT BONE PALACOS R-G (Orthopedic Implant) ×6 IMPLANT
CEMENT RESTRICTOR BONE PREP ST (KITS) ×1 IMPLANT
CHLORAPREP W/TINT 26ML (MISCELLANEOUS) ×5 IMPLANT
CUFF TOURN SGL QUICK 34 (TOURNIQUET CUFF) ×3
CUFF TRNQT CYL 34X4X40X1 (TOURNIQUET CUFF) ×1 IMPLANT
DRAIN CHANNEL 10F 3/8 F FF (DRAIN) IMPLANT
DRAPE EXTREMITY T 121X128X90 (DRAPE) ×3 IMPLANT
DRAPE INCISE IOBAN 66X45 STRL (DRAPES) ×4 IMPLANT
DRAPE LG THREE QUARTER DISP (DRAPES) ×6 IMPLANT
DRAPE POUCH INSTRU U-SHP 10X18 (DRAPES) ×3 IMPLANT
DRAPE U-SHAPE 47X51 STRL (DRAPES) ×3 IMPLANT
DRSG TEGADERM 4X4.75 (GAUZE/BANDAGES/DRESSINGS) ×3 IMPLANT
ELECT PENCIL ROCKER SW 15FT (MISCELLANEOUS) ×3 IMPLANT
ELECT REM PT RETURN 15FT ADLT (MISCELLANEOUS) ×3 IMPLANT
ELIPTICAL SEGMENT W/ SCREWS (Knees) ×3 IMPLANT
EVACUATOR 1/8 PVC DRAIN (DRAIN) ×3 IMPLANT
EVACUATOR DRAINAGE 10X20 100CC (DRAIN) IMPLANT
EVACUATOR SILICONE 100CC (DRAIN) ×5 IMPLANT
FACESHIELD WRAPAROUND (MASK) ×3 IMPLANT
FACESHIELD WRAPAROUND OR TEAM (MASK) ×3 IMPLANT
FEMORAL BUSHING OSS ARCOM SET (Knees) ×2 IMPLANT
FEMORAL COMP OSS 7CM LEFT (Knees) ×3 IMPLANT
GAUZE SPONGE 4X4 12PLY STRL (GAUZE/BANDAGES/DRESSINGS) ×2 IMPLANT
GLOVE BIO SURGEON STRL SZ8.5 (GLOVE) ×9 IMPLANT
GLOVE BIOGEL PI IND STRL 8.5 (GLOVE) ×1 IMPLANT
GLOVE BIOGEL PI INDICATOR 8.5 (GLOVE) ×2
GOWN SPEC L3 XXLG W/TWL (GOWN DISPOSABLE) ×3 IMPLANT
HANDPIECE INTERPULSE COAX TIP (DISPOSABLE) ×3
HOOD PEEL AWAY FLYTE STAYCOOL (MISCELLANEOUS) ×6 IMPLANT
IMMOBILIZER KNEE 20 (SOFTGOODS) ×3
IMMOBILIZER KNEE 20 THIGH 36 (SOFTGOODS) IMPLANT
KIT BONE PREP STRYKER (KITS) ×1
KIT PREVENA INCISION MGT20CM45 (CANNISTER) ×3 IMPLANT
LIQUID BAND (GAUZE/BANDAGES/DRESSINGS) ×2 IMPLANT
MANIFOLD NEPTUNE II (INSTRUMENTS) ×3 IMPLANT
MARKER SKIN DUAL TIP RULER LAB (MISCELLANEOUS) ×3 IMPLANT
NDL SPNL 18GX3.5 QUINCKE PK (NEEDLE) ×2 IMPLANT
NEEDLE SPNL 18GX3.5 QUINCKE PK (NEEDLE) ×3 IMPLANT
NS IRRIG 1000ML POUR BTL (IV SOLUTION) ×3 IMPLANT
PADDING CAST COTTON 6X4 STRL (CAST SUPPLIES) ×4 IMPLANT
PIN LOCKING OSS ARCOM (Knees) ×2 IMPLANT
POSITIONER SURGICAL ARM (MISCELLANEOUS) ×3 IMPLANT
PRESSURIZER FEMORAL UNIV (MISCELLANEOUS) ×2 IMPLANT
SEALER BIPOLAR AQUA 6.0 (INSTRUMENTS) ×3 IMPLANT
SEGMENT  ELIPTICAL W/ SCREWS (Knees) IMPLANT
SET HNDPC FAN SPRY TIP SCT (DISPOSABLE) ×1 IMPLANT
SET PAD KNEE POSITIONER (MISCELLANEOUS) ×3 IMPLANT
SOL PREP POV-IOD 4OZ 10% (MISCELLANEOUS) ×3 IMPLANT
SPONGE DRAIN TRACH 4X4 STRL 2S (GAUZE/BANDAGES/DRESSINGS) ×3 IMPLANT
SPONGE LAP 18X18 X RAY DECT (DISPOSABLE) ×6 IMPLANT
STAPLER VISISTAT 35W (STAPLE) ×2 IMPLANT
STEM BOWED W/SCREW 12X150 HIP (Stem) ×2 IMPLANT
SUCTION FRAZIER HANDLE 12FR (TUBING) ×2
SUCTION TUBE FRAZIER 12FR DISP (TUBING) ×1 IMPLANT
SUT ETHILON 2 0 PSLX (SUTURE) ×4 IMPLANT
SUT MNCRL AB 3-0 PS2 18 (SUTURE) ×3 IMPLANT
SUT MON AB 2-0 CT1 36 (SUTURE) ×3 IMPLANT
SUT VIC AB 0 CT1 36 (SUTURE) ×2 IMPLANT
SUT VIC AB 1 CT1 36 (SUTURE) ×6 IMPLANT
SUT VLOC 180 0 24IN GS25 (SUTURE) ×3 IMPLANT
SYR 50ML LL SCALE MARK (SYRINGE) ×4 IMPLANT
SYSTEM YOKE ORTHOPEDIC SALVAGE (Knees) IMPLANT
TIBIAL BEARING OSS ARCOM 12MM (Knees) ×3 IMPLANT
TIBIAL BUSHING OSS ARCOM (Knees) ×6 IMPLANT
TOWEL NATURAL 10PK STERILE (DISPOSABLE) ×2 IMPLANT
TOWEL OR 17X26 10 PK STRL BLUE (TOWEL DISPOSABLE) ×3 IMPLANT
TOWER CARTRIDGE SMART MIX (DISPOSABLE) ×5 IMPLANT
TRAY FOLEY W/METER SILVER 14FR (SET/KITS/TRAYS/PACK) ×3 IMPLANT
WATER STERILE IRR 1500ML POUR (IV SOLUTION) ×5 IMPLANT
WRAP KNEE MAXI GEL POST OP (GAUZE/BANDAGES/DRESSINGS) ×3 IMPLANT
YANKAUER SUCT BULB TIP 10FT TU (MISCELLANEOUS) ×3 IMPLANT
YOKE ORTHOPEDIC SALVAGE SYSTEM (Knees) ×3 IMPLANT

## 2016-02-12 NOTE — Anesthesia Preprocedure Evaluation (Addendum)
Anesthesia Evaluation  Patient identified by MRN, date of birth, ID band Patient awake    Reviewed: Allergy & Precautions, NPO status , Patient's Chart, lab work & pertinent test results  History of Anesthesia Complications Negative for: history of anesthetic complications  Airway Mallampati: II  TM Distance: >3 FB     Dental no notable dental hx. (+) Dental Advisory Given   Pulmonary neg pulmonary ROS,    Pulmonary exam normal        Cardiovascular hypertension, Normal cardiovascular exam     Neuro/Psych  Headaches, negative psych ROS   GI/Hepatic Neg liver ROS, GERD  ,  Endo/Other  diabetesMorbid obesity  Renal/GU negative Renal ROS     Musculoskeletal   Abdominal   Peds  Hematology  (+) anemia ,   Anesthesia Other Findings   Reproductive/Obstetrics                           Anesthesia Physical Anesthesia Plan  ASA: III  Anesthesia Plan: General   Post-op Pain Management:    Induction: Intravenous  Airway Management Planned: Oral ETT  Additional Equipment:   Intra-op Plan:   Post-operative Plan: Extubation in OR  Informed Consent: I have reviewed the patients History and Physical, chart, labs and discussed the procedure including the risks, benefits and alternatives for the proposed anesthesia with the patient or authorized representative who has indicated his/her understanding and acceptance.   Dental advisory given  Plan Discussed with: CRNA and Anesthesiologist  Anesthesia Plan Comments:        Anesthesia Quick Evaluation

## 2016-02-12 NOTE — Anesthesia Procedure Notes (Signed)
Procedure Name: Intubation Date/Time: 02/12/2016 7:22 PM Performed by: Maxwell Caul Pre-anesthesia Checklist: Patient identified, Emergency Drugs available, Suction available and Patient being monitored Patient Re-evaluated:Patient Re-evaluated prior to inductionOxygen Delivery Method: Circle system utilized Preoxygenation: Pre-oxygenation with 100% oxygen Intubation Type: IV induction Ventilation: Mask ventilation without difficulty Laryngoscope Size: Mac and 3 Grade View: Grade II Tube type: Oral Tube size: 7.5 mm Number of attempts: 1 Airway Equipment and Method: Stylet Placement Confirmation: ETT inserted through vocal cords under direct vision,  positive ETCO2 and breath sounds checked- equal and bilateral Secured at: 21 cm Tube secured with: Tape Dental Injury: Teeth and Oropharynx as per pre-operative assessment

## 2016-02-12 NOTE — Transfer of Care (Signed)
Immediate Anesthesia Transfer of Care Note  Patient: Susan Haney  Procedure(s) Performed: Procedure(s): LEFT TOTAL KNEE REVISION (Left)  Patient Location: PACU  Anesthesia Type:General  Level of Consciousness: sedated  Airway & Oxygen Therapy: Patient Spontanous Breathing and Patient connected to face mask oxygen  Post-op Assessment: Report given to RN and Post -op Vital signs reviewed and stable  Post vital signs: Reviewed and stable  Last Vitals:  Vitals:   02/12/16 0703 02/12/16 1307  BP: (!) 115/54 (!) 135/48  Pulse: 72 66  Resp: 16 16  Temp: 36.5 C 36.9 C    Last Pain:  Vitals:   02/12/16 1307  TempSrc: Oral  PainSc:       Patients Stated Pain Goal: 2 ([id number])  Complications: No apparent anesthesia complications

## 2016-02-12 NOTE — Consult Note (Signed)
ORTHOPAEDIC CONSULTATION  REQUESTING PHYSICIAN: Velvet Bathe, MD  PCP:  Octavio Graves, DO  Chief Complaint: Left knee injury  HPI: Susan Haney is a 82 y.o. female who has a history of a previous bilateral total knee arthroplasties done by Dr. Harden Mo in 2003. She tells me that she suffers from chronic low back pain with radicular left leg pain that limits her function at times. She was also recently diagnosed with endometrial cancer and was scheduled for a hysterectomy in about a week. She was at church yesterday when she tripped and fell. She was brought to the emergency department at [facility]. She was unable to weight-bear. She had a deformity. She denies other injuries.  Past Medical History:  Diagnosis Date  . Anemia    history ,  took iron for years  . Arthritis   . Chronic back pain   . Diabetes ([place])    x 15 yrs with good control  . GERD (gastroesophageal reflux disease)   . Headache(784.0)   . Hypertension    Past Surgical History:  Procedure Laterality Date  . COLONOSCOPY N/A 04/04/2014   Procedure: COLONOSCOPY;  Surgeon: Rogene Houston, MD;  [facility];  Service: Endoscopy;  Laterality: N/A;  100  . FOOT SURGERY     Left  . Ovary removed: benign    . REPLACEMENT TOTAL KNEE BILATERAL     2013   Social History   Social History  . Marital status: Widowed    Spouse name: N/A  . Number of children: N/A  . Years of education: N/A   Social History Main Topics  . Smoking status: Never Smoker  . Smokeless tobacco: Never Used  . Alcohol use No  . Drug use: No  . Sexual activity: Not Currently   Other Topics Concern  . None   Social History Narrative  . None   Family History  Problem Relation Age of Onset  . Diabetes Maternal Grandmother    No Known Allergies Prior to Admission medications   Medication Sig Start Date End Date Taking? Authorizing Provider  ASPERCREME LIDOCAINE EX Apply topically daily as needed.   Yes Historical  Provider, MD  aspirin 81 MG tablet Take 81 mg by mouth daily.   Yes Historical Provider, MD  cholecalciferol (VITAMIN D) 400 UNITS TABS tablet Take 2,000 Units by mouth.   Yes Historical Provider, MD  docusate sodium (COLACE) 100 MG capsule Take 100 mg by mouth 2 (two) times daily.   Yes Historical Provider, MD  metFORMIN (GLUMETZA) 500 MG (MOD) 24 hr tablet Take 500 mg by mouth every evening.   Yes Historical Provider, MD  Multiple Vitamins-Minerals (CENTRUM SILVER ADULT 50+) TABS Take 1 tablet by mouth daily.    Yes Historical Provider, MD  naproxen (NAPROSYN) 500 MG tablet Take 500 mg by mouth as needed.   Yes Historical Provider, MD  Omega-3 Fatty Acids (OMEGA 3 500 PO) Take 1 tablet by mouth.    Yes Historical Provider, MD  omeprazole (PRILOSEC) 20 MG capsule TAKE 1 CAPSULE (20 MG TOTAL) BY MOUTH DAILY. 07/15/15  Yes Rogene Houston, MD  Probiotic Product (PHILLIPS COLON HEALTH PO) Take 1 tablet by mouth.    Yes Historical Provider, MD  sitaGLIPtin (JANUVIA) 100 MG tablet Take 100 mg by mouth daily.   Yes Historical Provider, MD  losartan-hydrochlorothiazide (HYZAAR) 100-12.5 MG tablet Take 1 tablet by mouth daily. 01/16/16   Historical Provider, MD   Ct Femur Left Wo  Contrast  Result Date: 02/12/2016 CLINICAL DATA:  Evaluate distal femur fracture seen on radiographs. EXAM: CT OF THE LEFT FEMUR WITHOUT CONTRAST TECHNIQUE: Multidetector CT imaging was performed according to the standard protocol. Multiplanar CT image reconstructions were also generated. COMPARISON:  Left knee radiographs - 02/10/2026 FINDINGS: Re- demonstrated comminuted fracture involving the distal metaphysis of the femur with foreshortening and angulation, apex anterior. There are several osseous fragment about the main fracture site with dominant fragment about the anterior medial aspect of main fracture site measuring approximately 4.9 x 0.9 cm (image 45, series 6). There is no definitive extension of the comminuted fracture  to involve the femoral component of the patient's left total knee replacement. Additionally, the tibial component of the total knee replacement appears intact. Expected adjacent soft tissue swelling. Evaluation for knee joint effusion and/or lipohemarthrosis is degraded secondary to streak artifact from the patient's total knee prosthesis. No definite radiopaque foreign body. No additional fractures are identified. Normal appearance of the left hip. Left hip joint spaces are preserved. No evidence avascular necrosis. Several phleboliths are seen within the left hemipelvis. Scattered minimal vascular calcifications within the left superficial femoral artery. IMPRESSION: 1. Re- demonstrated comminuted fracture of the distal metaphysis of the left femur with foreshortening and angulation but without definitive extension to involve the femoral component of the patient's left total knee replacement. 2. No additional fractures identified. Electronically Signed   By: Sandi Mariscal M.D.   On: 02/12/2016 00:48  Dg Knee Complete 4 Views Left  Result Date: 02/11/2016 CLINICAL DATA:  Status post fall. EXAM: LEFT KNEE - COMPLETE 4+ VIEW COMPARISON:  None. FINDINGS: Left total knee arthroplasty. Comminuted fracture of the distal femoral metaphysis just above the arthroplasty with 2 cm of posterior displacement and 2.5 cm of lateral displacement. No other fracture or dislocation. No aggressive lytic or sclerotic osseous lesion. Soft tissue contusion along the anterolateral aspect of the left knee. IMPRESSION: 1. Comminuted and displaced fracture of the distal left femoral metaphysis just above the left total knee arthroplasty. Electronically Signed   By: Kathreen Devoid   On: 02/11/2016 20:09   Positive ROS: All other systems have been reviewed and were otherwise negative with the exception of those mentioned in the HPI and as above.  Physical Exam: General: Alert, no acute distress Cardiovascular: No pedal  edema Respiratory: No cyanosis, no use of accessory musculature GI: No organomegaly, abdomen is soft and non-tender Skin: No lesions in the area of chief complaint Neurologic: Sensation intact distally Psychiatric: Patient is competent for consent with normal mood and affect Lymphatic: No axillary or cervical lymphadenopathy  MUSCULOSKELETAL: Left lower extremity: She has a healed anterior knee incision. She has a small superficial abrasion. The extremity is shortened and rotated. She has tenderness to palpation over the distal femur. She has palpable pulses. Motor function is intact, however strength is limited by knee pain. She reports intact sensation to light touch  Assessment: Comminuted periprosthetic left distal femur fracture  Plan: I discussed the findings with the patient. I reviewed her imaging including a CT scan of the femur. She has 2 options at this point: ORIF of the left femur versus revision to a distal femur replacement. We discussed the risks, benefits, and alternatives to both treatment options. The patient desires to proceed with revision total knee arthroplasty to a distal femur replacement. She will have an upcoming hysterectomy. Revision knee surgery will allow her quicker mobilization. We discussed the risks, benefits, and alternatives. We'll plan for surgery  later today. Continue nothing by mouth status. Hold chemical DVT prophylaxis.  The risks, benefits, and alternatives were discussed with the patient. There are risks associated with the surgery including, but not limited to, problems with anesthesia (death), infection, instability (giving out of the joint), dislocation, differences in leg length/angulation/rotation, fracture of bones, loosening or failure of implants, hematoma (blood accumulation) which may require surgical drainage, blood clots, pulmonary embolism, nerve injury (foot drop and lateral thigh numbness), and blood vessel injury. The patient understands  these risks and elects to proceed.     Koki Buxton, Horald Pollen, MD Cell [phone]    02/12/2016 7:32 AM

## 2016-02-12 NOTE — Anesthesia Procedure Notes (Addendum)
Anesthesia Regional Block:  Adductor canal block  Pre-Anesthetic Checklist: ,, timeout performed, Correct Patient, Correct Site, Correct Laterality, Correct Procedure, Correct Position, site marked, Risks and benefits discussed,  Surgical consent,  Pre-op evaluation,  At surgeon's request and post-op pain management  Laterality: Left  Prep: chloraprep       Needles:  Injection technique: Single-shot  Needle Type: Stimulator Needle - 80     Needle Length: 10cm 10 cm Needle Gauge: 21 and 21 G    Additional Needles:  Procedures: ultrasound guided (picture in chart) Adductor canal block Narrative:  Start time: 02/12/2016 6:42 PM End time: 02/12/2016 6:52 PM Injection made incrementally with aspirations every 5 mL.  Performed by: Personally

## 2016-02-12 NOTE — Progress Notes (Signed)
Notes reviewed, patient seen.  Patient admitted after midnight.  Orthopedics planning to operate this PM.  Will plan to see in AM.  Carlyon Shadow, M.D.

## 2016-02-12 NOTE — Brief Op Note (Signed)
02/12/2016  11:36 PM  PATIENT:  Susan Haney  82 y.o. female  PRE-OPERATIVE DIAGNOSIS:  Left distal femur (knee) peri-prosthetic fracture  POST-OPERATIVE DIAGNOSIS:  Left distal femur (knee) peri-prosthetic fracture  PROCEDURE:  Procedure(s): LEFT TOTAL KNEE REVISION (Left)  SURGEON:  Surgeon(s) and Role:    * Rod Can, MD - Primary  PHYSICIAN ASSISTANT: none  ASSISTANTS: staff   ANESTHESIA:   regional and general  EBL:  Total I/O In: 3100 [I.V.:3100] Out: 1000 [Urine:900; Blood:100]  BLOOD ADMINISTERED:none  DRAINS: (1) Jackson-Pratt drain(s) with closed bulb suction in the subcu tissues, (1) Hemovact drain(s) in the left knee with  Suction Open and Prevena wound VAC   LOCAL MEDICATIONS USED:  MARCAINE     SPECIMEN:  Source of Specimen:  distal femur for permanent pathology  DISPOSITION OF SPECIMEN:  PATHOLOGY  COUNTS:  YES  TOURNIQUET:   Total Tourniquet Time Documented: Thigh (Left) - 123 minutes Total: Thigh (Left) - 123 minutes   DICTATION: .Other Dictation: Dictation Number [id number]  PLAN OF CARE: Admit to inpatient   PATIENT DISPOSITION:  PACU - hemodynamically stable.   Delay start of Pharmacological VTE agent (>24hrs) due to surgical blood loss or risk of bleeding: no

## 2016-02-12 NOTE — H&P (Signed)
History and Physical  Susan Haney [id number] DOB: 1931/10/14 DOA: 02/11/2016  PCP:  Susan Graves, DO   Chief Complaint:  Fall   History of Present Illness:  Patient is a 82 yo female with history of left knee replacement 15 years ago and DMII came with cc of fall that she had today as she tripped at church. She had no dizziness, vertigo, chest pain or dyspnea. She fell forward on her left knee/thigh and complains of pain in her left lower thigh. Otherwise she has no complaints. She has no history of prior falls for the past couple of years.   Review of Systems:  CONSTITUTIONAL:     No night sweats.  No fatigue.  No fever. No chills. Eyes:                            No visual changes.  No eye pain.  No eye discharge.   ENT:                              No epistaxis.  No sinus pain.  No sore throat.   No congestion. RESPIRATORY:           No cough.  No wheeze.  No hemoptysis.  No dyspnea CARDIOVASCULAR   :  No chest pains.  No palpitations. GASTROINTESTINAL:  No abdominal pain.  No nausea. No vomiting.  No diarrhea. GENITOURINARY:      No urgency.  No frequency.  No dysuria.  No hematuria.  No obstructive symptoms.  No discharge.  No pain.   MUSCULOSKELETAL:  +musculoskeletal pain.  No joint swelling.  No arthritis. NEUROLOGICAL:        No confusion.  No weakness. No headache. No seizure. PSYCHIATRIC:             No depression. No anxiety. No suicidal ideation. SKIN:                             No rashes.  No lesions.  No wounds. ENDOCRINE:                No weight loss.  No polydipsia.  No polyuria.  No polyphagia. HEMATOLOGIC:           No purpura.  No petechiae.  No bleeding.  ALLERGIC                 : No pruritus.  No angioedema Other:  Past Medical and Surgical History:   Past Medical History:  Diagnosis Date  . Anemia    history ,  took iron for years  . Arthritis   . Chronic back pain   . Diabetes ([place])    x 15 yrs with good control  . GERD  (gastroesophageal reflux disease)   . Headache(784.0)   . Hypertension    Past Surgical History:  Procedure Laterality Date  . COLONOSCOPY N/A 04/04/2014   Procedure: COLONOSCOPY;  Surgeon: Rogene Houston, MD;  Location: [facility];  Service: Endoscopy;  Laterality: N/A;  100  . FOOT SURGERY     Left  . Ovary removed: benign    . REPLACEMENT TOTAL KNEE BILATERAL     2013    Social History:   reports that she has never smoked. She has never used smokeless tobacco. She reports that she does not drink  alcohol or use drugs.    No Known Allergies  Family History  Problem Relation Age of Onset  . Diabetes Maternal Grandmother       Prior to Admission medications   Medication Sig Start Date End Date Taking? Authorizing Provider  ASPERCREME LIDOCAINE EX Apply topically daily as needed.   Yes Historical Provider, MD  aspirin 81 MG tablet Take 81 mg by mouth daily.   Yes Historical Provider, MD  cholecalciferol (VITAMIN D) 400 UNITS TABS tablet Take 2,000 Units by mouth.   Yes Historical Provider, MD  docusate sodium (COLACE) 100 MG capsule Take 100 mg by mouth 2 (two) times daily.   Yes Historical Provider, MD  metFORMIN (GLUMETZA) 500 MG (MOD) 24 hr tablet Take 500 mg by mouth every evening.   Yes Historical Provider, MD  Multiple Vitamins-Minerals (CENTRUM SILVER ADULT 50+) TABS Take 1 tablet by mouth daily.    Yes Historical Provider, MD  naproxen (NAPROSYN) 500 MG tablet Take 500 mg by mouth as needed.   Yes Historical Provider, MD  Omega-3 Fatty Acids (OMEGA 3 500 PO) Take 1 tablet by mouth.    Yes Historical Provider, MD  omeprazole (PRILOSEC) 20 MG capsule TAKE 1 CAPSULE (20 MG TOTAL) BY MOUTH DAILY. 07/15/15  Yes Rogene Houston, MD  Probiotic Product (PHILLIPS COLON HEALTH PO) Take 1 tablet by mouth.    Yes Historical Provider, MD  sitaGLIPtin (JANUVIA) 100 MG tablet Take 100 mg by mouth daily.   Yes Historical Provider, MD  losartan-hydrochlorothiazide (HYZAAR) 100-12.5  MG tablet Take 1 tablet by mouth daily. 01/16/16   Historical Provider, MD    Physical Exam: BP 124/63   Pulse 82   Temp 98.2 F (36.8 C)   Resp 17   SpO2 98%   GENERAL :   Alert and cooperative, and appears to be in no acute distress. HEAD:           normocephalic. EYES:            PERRL, EOMI.  EARS:           hearing grossly intact. NOSE:           No nasal discharge. THROAT:     Oral cavity and pharynx normal.   NECK:          supple, non-tender. CARDIAC:    Normal S1 and S2. No gallop. No murmurs.  Vascular:     no peripheral edema.  LUNGS:       Clear to auscultation  ABDOMEN: Positive bowel sounds. Soft, nondistended, nontender. No guarding or rebound.      MSK:           Tenderness in left leg above knee level to palpation. Pulse and sensation intact in left lower leg.   EXT           : No significant deformity or joint abnormality. Neuro        : Alert, oriented to person, place, and time. SKIN:            No rash. No lesions. PSYCH:       No hallucination. Patient is not suicidal.          Labs on Admission:  Reviewed.   Radiological Exams on Admission: Ct Femur Left Wo Contrast  Result Date: 02/12/2016 CLINICAL DATA:  Evaluate distal femur fracture seen on radiographs. EXAM: CT OF THE LEFT FEMUR WITHOUT CONTRAST TECHNIQUE: Multidetector CT imaging was performed according to the standard protocol.  Multiplanar CT image reconstructions were also generated. COMPARISON:  Left knee radiographs - 02/10/2026 FINDINGS: Re- demonstrated comminuted fracture involving the distal metaphysis of the femur with foreshortening and angulation, apex anterior. There are several osseous fragment about the main fracture site with dominant fragment about the anterior medial aspect of main fracture site measuring approximately 4.9 x 0.9 cm (image 45, series 6). There is no definitive extension of the comminuted fracture to involve the femoral component of the patient's left total knee  replacement. Additionally, the tibial component of the total knee replacement appears intact. Expected adjacent soft tissue swelling. Evaluation for knee joint effusion and/or lipohemarthrosis is degraded secondary to streak artifact from the patient's total knee prosthesis. No definite radiopaque foreign body. No additional fractures are identified. Normal appearance of the left hip. Left hip joint spaces are preserved. No evidence avascular necrosis. Several phleboliths are seen within the left hemipelvis. Scattered minimal vascular calcifications within the left superficial femoral artery. IMPRESSION: 1. Re- demonstrated comminuted fracture of the distal metaphysis of the left femur with foreshortening and angulation but without definitive extension to involve the femoral component of the patient's left total knee replacement. 2. No additional fractures identified. Electronically Signed   By: Sandi Mariscal M.D.   On: 02/12/2016 00:48  Dg Knee Complete 4 Views Left  Result Date: 02/11/2016 CLINICAL DATA:  Status post fall. EXAM: LEFT KNEE - COMPLETE 4+ VIEW COMPARISON:  None. FINDINGS: Left total knee arthroplasty. Comminuted fracture of the distal femoral metaphysis just above the arthroplasty with 2 cm of posterior displacement and 2.5 cm of lateral displacement. No other fracture or dislocation. No aggressive lytic or sclerotic osseous lesion. Soft tissue contusion along the anterolateral aspect of the left knee. IMPRESSION: 1. Comminuted and displaced fracture of the distal left femoral metaphysis just above the left total knee arthroplasty. Electronically Signed   By: Kathreen Devoid   On: 02/11/2016 20:09     Assessment/Plan  Left femoral metaphysis fracture: displaced Ortho consulted; will operate in am Patient NPO after MN Morphine prn pain  DMII: hold PO meds, on low dose correction insulin.   Input & Output:  NA Lines & Tubes: PIV DVT prophylaxis: [place] enoxaparin, SCDs GI prophylaxis:  PPI Consultants: Ortho  Code Status: Full Family Communication: none at bedside  Disposition Plan: admit to tele     Gennaro Africa M.D [facility]

## 2016-02-13 ENCOUNTER — Encounter (HOSPITAL_COMMUNITY): Payer: Self-pay | Admitting: Orthopedic Surgery

## 2016-02-13 DIAGNOSIS — S72402A Unspecified fracture of lower end of left femur, initial encounter for closed fracture: Secondary | ICD-10-CM | POA: Diagnosis present

## 2016-02-13 LAB — CBC
HEMATOCRIT: 25.6 % — AB (ref 36.0–46.0)
Hemoglobin: 8.1 g/dL — ABNORMAL LOW (ref 12.0–15.0)
MCH: 27.4 pg (ref 26.0–34.0)
MCHC: 31.6 g/dL (ref 30.0–36.0)
MCV: 86.5 fL (ref 78.0–100.0)
Platelets: 234 10*3/uL (ref 150–400)
RBC: 2.96 MIL/uL — ABNORMAL LOW (ref 3.87–5.11)
RDW: 14.3 % (ref 11.5–15.5)
WBC: 15.6 10*3/uL — ABNORMAL HIGH (ref 4.0–10.5)

## 2016-02-13 LAB — GLUCOSE, CAPILLARY
GLUCOSE-CAPILLARY: 119 mg/dL — AB (ref 65–99)
GLUCOSE-CAPILLARY: 125 mg/dL — AB (ref 65–99)
GLUCOSE-CAPILLARY: 133 mg/dL — AB (ref 65–99)
GLUCOSE-CAPILLARY: 140 mg/dL — AB (ref 65–99)
GLUCOSE-CAPILLARY: 201 mg/dL — AB (ref 65–99)

## 2016-02-13 LAB — BASIC METABOLIC PANEL
Anion gap: 5 (ref 5–15)
BUN: 19 mg/dL (ref 6–20)
CALCIUM: 8.3 mg/dL — AB (ref 8.9–10.3)
CO2: 26 mmol/L (ref 22–32)
CREATININE: 0.97 mg/dL (ref 0.44–1.00)
Chloride: 106 mmol/L (ref 101–111)
GFR calc non Af Amer: 53 mL/min — ABNORMAL LOW (ref >60)
GLUCOSE: 167 mg/dL — AB (ref 65–99)
Potassium: 4.3 mmol/L (ref 3.5–5.1)
Sodium: 137 mmol/L (ref 135–145)

## 2016-02-13 MED ORDER — METHOCARBAMOL 1000 MG/10ML IJ SOLN
500.0000 mg | Freq: Four times a day (QID) | INTRAVENOUS | Status: DC | PRN
  Administered 2016-02-13: 500 mg via INTRAVENOUS
  Filled 2016-02-13: qty 5
  Filled 2016-02-13: qty 550

## 2016-02-13 MED ORDER — MAGNESIUM CITRATE PO SOLN: 1.0000 | Freq: Once | ORAL | Status: DC | PRN

## 2016-02-13 MED ORDER — ACETAMINOPHEN 325 MG PO TABS
650.0000 mg | ORAL_TABLET | Freq: Four times a day (QID) | ORAL | Status: DC | PRN
  Administered 2016-02-14 – 2016-02-16 (×2): 650 mg via ORAL
  Filled 2016-02-13 (×2): qty 2

## 2016-02-13 MED ORDER — PHENOL 1.4 % MT LIQD
1.0000 | OROMUCOSAL | Status: DC | PRN
Start: 2016-02-13 — End: 2016-02-16

## 2016-02-13 MED ORDER — METHOCARBAMOL 500 MG PO TABS: 500.0000 mg | ORAL_TABLET | Freq: Four times a day (QID) | ORAL | Status: DC | PRN

## 2016-02-13 MED ORDER — ONDANSETRON HCL 4 MG PO TABS: 4.0000 mg | ORAL_TABLET | Freq: Four times a day (QID) | ORAL | Status: DC | PRN

## 2016-02-13 MED ORDER — MENTHOL 3 MG MT LOZG: 1.0000 | LOZENGE | OROMUCOSAL | Status: DC | PRN

## 2016-02-13 MED ORDER — METHOCARBAMOL 1000 MG/10ML IJ SOLN: 500.0000 mg | Freq: Four times a day (QID) | INTRAVENOUS | Status: DC | PRN

## 2016-02-13 MED ORDER — HYDROCODONE-ACETAMINOPHEN 5-325 MG PO TABS
1.0000 | ORAL_TABLET | Freq: Four times a day (QID) | ORAL | Status: DC | PRN
  Administered 2016-02-13 (×2): 1 via ORAL
  Administered 2016-02-14 (×3): 2 via ORAL
  Administered 2016-02-15 – 2016-02-16 (×3): 1 via ORAL
  Administered 2016-02-16 (×2): 2 via ORAL
  Filled 2016-02-13 (×2): qty 1
  Filled 2016-02-13 (×2): qty 2
  Filled 2016-02-13: qty 1
  Filled 2016-02-13: qty 2
  Filled 2016-02-13: qty 1
  Filled 2016-02-13: qty 2
  Filled 2016-02-13 (×3): qty 1

## 2016-02-13 MED ORDER — METOCLOPRAMIDE HCL 5 MG PO TABS
5.0000 mg | ORAL_TABLET | Freq: Three times a day (TID) | ORAL | Status: DC | PRN
  Administered 2016-02-14 – 2016-02-16 (×4): 10 mg via ORAL
  Filled 2016-02-13 (×4): qty 2

## 2016-02-13 MED ORDER — METOCLOPRAMIDE HCL 5 MG/ML IJ SOLN: 5.0000 mg | Freq: Three times a day (TID) | INTRAMUSCULAR | Status: DC | PRN

## 2016-02-13 MED ORDER — ENOXAPARIN SODIUM 40 MG/0.4ML ~~LOC~~ SOLN
40.0000 mg | Freq: Every day | SUBCUTANEOUS | Status: DC
  Administered 2016-02-13 – 2016-02-16 (×4): 40 mg via SUBCUTANEOUS
  Filled 2016-02-13 (×4): qty 0.4

## 2016-02-13 MED ORDER — MORPHINE SULFATE (PF) 2 MG/ML IV SOLN
0.5000 mg | INTRAVENOUS | Status: DC | PRN
  Administered 2016-02-13: 0.5 mg via INTRAVENOUS
  Filled 2016-02-13: qty 1

## 2016-02-13 MED ORDER — VANCOMYCIN HCL IN DEXTROSE 1-5 GM/200ML-% IV SOLN
1000.0000 mg | Freq: Two times a day (BID) | INTRAVENOUS | Status: AC
  Administered 2016-02-13: 1000 mg via INTRAVENOUS
  Filled 2016-02-13: qty 200

## 2016-02-13 MED ORDER — ACETAMINOPHEN 650 MG RE SUPP: 650.0000 mg | Freq: Four times a day (QID) | RECTAL | Status: DC | PRN

## 2016-02-13 MED ORDER — ONDANSETRON HCL 4 MG/2ML IJ SOLN: 4.0000 mg | Freq: Four times a day (QID) | INTRAMUSCULAR | Status: DC | PRN

## 2016-02-13 MED ORDER — SORBITOL 70 % SOLN
30.0000 mL | Freq: Every day | Status: DC | PRN
  Filled 2016-02-13: qty 30

## 2016-02-13 MED ORDER — METHOCARBAMOL 500 MG PO TABS
500.0000 mg | ORAL_TABLET | Freq: Four times a day (QID) | ORAL | Status: DC | PRN
  Administered 2016-02-14 – 2016-02-16 (×3): 500 mg via ORAL
  Filled 2016-02-13 (×3): qty 1

## 2016-02-13 MED ORDER — LACTATED RINGERS IV SOLN
INTRAVENOUS | Status: DC
  Administered 2016-02-13: 12:00:00 via INTRAVENOUS

## 2016-02-13 MED ORDER — POLYETHYLENE GLYCOL 3350 17 G PO PACK
17.0000 g | PACK | Freq: Every day | ORAL | Status: DC | PRN
  Administered 2016-02-15: 17 g via ORAL
  Filled 2016-02-13: qty 1

## 2016-02-13 NOTE — Progress Notes (Signed)
   Subjective:  Patient reports pain as mild to moderate.  No c/o.  Objective:   VITALS:   Vitals:   02/13/16 0315 02/13/16 0414 02/13/16 0628 02/13/16 1000  BP: (!) 122/57 (!) 125/56 (!) 126/51 120/64  Pulse: 95 97 84 78  Resp: 14 14 14 16   Temp: 97.8 F (36.6 C) 97.6 F (36.4 C) 97.8 F (36.6 C) 98 F (36.7 C)  TempSrc: Oral Oral Oral   SpO2: 100% 100% 100% 96%  Weight:      Height:        JP (in subcu tissue): 20 cc o/n HV (knee joint): 165 cc o/n  ABD soft Sensation intact distally Intact pulses distally Dorsiflexion/Plantar flexion intact Incision: dressing C/D/I Compartment soft Knee immobilizer in place JP and HV ss Prevena VAC intact  Lab Results  Component Value Date   WBC 15.6 (H) 02/13/2016   HGB 8.1 (L) 02/13/2016   HCT 25.6 (L) 02/13/2016   MCV 86.5 02/13/2016   PLT 234 02/13/2016   BMET    Component Value Date/Time   NA 137 02/13/2016 0435   K 4.3 02/13/2016 0435   CL 106 02/13/2016 0435   CO2 26 02/13/2016 0435   GLUCOSE 167 (H) 02/13/2016 0435   BUN 19 02/13/2016 0435   CREATININE 0.97 02/13/2016 0435   CALCIUM 8.3 (L) 02/13/2016 0435   GFRNONAA 53 (L) 02/13/2016 0435   GFRAA >60 02/13/2016 0435     Assessment/Plan: 1 Day Post-Op   Principal Problem:   Closed fracture of left distal femur, initial encounter Active Problems:   Diabetes (HCC)   Essential hypertension, benign   WBAT with walker Knee immobilizer when OOB DVT ppx: lovenox x30 days, foot pumps, TEDs PT/OT Cont drains Will d/c HV when output < 30 cc / shift Will d/c JP when output is low following   Tyeson Tanimoto, Horald Pollen 02/13/2016, 11:33 AM   Rod Can, MD Cell [phone]

## 2016-02-13 NOTE — Evaluation (Signed)
Physical Therapy Evaluation Patient Details Name: KRISSIA RALSTON MRN: [id number] DOB: [birth year] Today's Date: 02/13/2016   History of Present Illness  pt adm with Left distal femur (knee) peri-prosthetic fracture; s/p L TK revision on 02/12/16. H/o B TKRs (2003).  Clinical Impression  Pt admitted as above and presenting with functional mobility limitations 2* decreased L LE strength, no ROM allowed L LE, and post op pain.  Pt should progress to dc home with family assist and HHPT follow up.    Follow Up Recommendations Home health PT    Equipment Recommendations  None recommended by PT    Recommendations for Other Services OT consult     Precautions / Restrictions Precautions Precautions: Knee;Fall Precaution Comments: no knee ROM; multiple drains and incision vac in place Required Braces or Orthoses: Knee Immobilizer - Left Knee Immobilizer - Left: On at all times Restrictions Weight Bearing Restrictions: No LLE Weight Bearing: Weight bearing as tolerated      Mobility  Bed Mobility Overal bed mobility: Needs Assistance Bed Mobility: Supine to Sit     Supine to sit: Min assist;+2 for safety/equipment;+2 for physical assistance;HOB elevated     General bed mobility comments: cues for sequence and use of R LE to self assist  Transfers Overall transfer level: Needs assistance Equipment used: Rolling walker (2 wheeled) Transfers: Sit to/from Stand Sit to Stand: Min assist;+2 physical assistance;+2 safety/equipment         General transfer comment: cues for LE management and use of UEs to self assist  Ambulation/Gait Ambulation/Gait assistance: Min assist;+2 physical assistance;+2 safety/equipment Ambulation Distance (Feet): 8 Feet Assistive device: Rolling walker (2 wheeled) Gait Pattern/deviations: Step-to pattern;Decreased step length - right;Decreased step length - left;Shuffle;Trunk flexed Gait velocity: decr Gait velocity interpretation: Below normal speed  for age/gender General Gait Details: cues for sequence, posture and position from RW - ltd by onset dizziness  Stairs            Wheelchair Mobility    Modified Rankin (Stroke Patients Only)       Balance                                             Pertinent Vitals/Pain Pain Assessment: 0-10 Pain Score: 0-No pain    Home Living Family/patient expects to be discharged to:: Private residence Living Arrangements: Alone Available Help at Discharge: Family;Available 24 hours/day Type of Home: House Home Access: Ramped entrance     Home Layout: One level Home Equipment: Walker - 2 wheels;Bedside commode      Prior Function Level of Independence: Independent         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, newspaper        Extremity/Trunk Assessment   Upper Extremity Assessment: Overall WFL for tasks assessed           Lower Extremity Assessment: LLE deficits/detail   LLE Deficits / Details: No ROM per physician - KI in place  Cervical / Trunk Assessment: Kyphotic  Communication   Communication: No difficulties  Cognition Arousal/Alertness: Awake/alert Behavior During Therapy: WFL for tasks assessed/performed Overall Cognitive Status: Within Functional Limits for tasks assessed                      General Comments General comments (skin integrity, edema, etc.): wound VAC and 2 other drains  Exercises Total Joint Exercises Ankle Circles/Pumps: AROM;Both;15 reps;Supine      Assessment/Plan    PT Assessment Patient needs continued PT services  PT Diagnosis Difficulty walking   PT Problem List Decreased strength;Decreased range of motion;Decreased activity tolerance;Decreased mobility;Decreased knowledge of use of DME;Pain;Obesity  PT Treatment Interventions DME instruction;Gait training;Functional mobility training;Therapeutic activities;Therapeutic exercise;Patient/family education   PT Goals (Current goals can  be found in the Care Plan section) Acute Rehab PT Goals Patient Stated Goal: to go home with sister's assistance PT Goal Formulation: With patient Potential to Achieve Goals: Good    Frequency 7X/week   Barriers to discharge        Co-evaluation PT/OT/SLP Co-Evaluation/Treatment: Yes Reason for Co-Treatment: For patient/therapist safety PT goals addressed during session: Mobility/safety with mobility OT goals addressed during session: ADL's and self-care       End of Session Equipment Utilized During Treatment: Gait belt;Left knee immobilizer Activity Tolerance: Patient tolerated treatment well;Other (comment) (ltd by dizziness) Patient left: in chair;with call bell/phone within reach;with family/visitor present Nurse Communication: Mobility status         Time: 1220-1243 PT Time Calculation (min) (ACUTE ONLY): 23 min   Charges:   PT Evaluation $PT Eval Low Complexity: 1 Procedure     PT G Codes:        Harve Spradley 03/07/2016, 5:44 PM

## 2016-02-13 NOTE — Progress Notes (Signed)
CSW consulted for SNF placement. PT eval / recommendations are pending. CSW will meet with pt this am following PT eval.  Werner Lean LCSW [phone]

## 2016-02-13 NOTE — Op Note (Signed)
NAMEORNELLA, HARTFIELD NO.:  [id number]  MEDICAL RECORD NO.:  [id number]  LOCATION:  80                         FACILITY:  [facility]  PHYSICIAN:  Rod Can, MD     DATE OF BIRTH:  1932/02/06  DATE OF PROCEDURE:  02/12/2016 DATE OF DISCHARGE:                              OPERATIVE REPORT   SURGEON:  Rod Can, MD.  ASSISTANT:  Staff.  PREOPERATIVE DIAGNOSIS:  Comminuted left distal femur periprosthetic fracture.  POSTOPERATIVE DIAGNOSIS:  Comminuted left distal femur periprosthetic fracture.  PROCEDURE PERFORMED:  Revision left total knee arthroplasty, tibial and femoral components to distal femur replacement.  EXPLANTS:  Zimmer NexGen cemented knee replacement.  IMPLANTS: 1. Biomet OSS segmental femoral component left 7 cm with 3 cm     elliptical diaphyseal segment and 12 mm x 150 mm bowed IM stem. 2. Nonmodular long tibial base plate size 71 mm. 3. Palacos R+G bone cement. 4. 12 mm standard ArCom tibial bearing with appropriate bushing.  ANESTHESIA:  General plus adductor canal block.  TUBES AND DRAINS: 1. Prevena wound VAC to incision. 2. Medium Hemovac drain to the left knee joint. 3. Flat JP drain to subcutaneous tissues.  ANTIBIOTICS: 1. 1 g vancomycin. 2. 2 g Ancef.  TOURNIQUET TIME:  2 hours.  DISPOSITION:  Stable to PACU.  COMPLICATIONS:  None.  INDICATIONS:  The patient is an 82 year old female who has a history of staged bilateral total knee replacements in 2003 by Dr. Lorre Nick.  She suffers from chronic low back pain and radicular leg pain.  She was recently diagnosed with cervical cancer and was scheduled to undergo hysterectomy in approximately 1 week.  She was at church last night when she tripped and fell on her left knee.  She had left knee pain, deformity, inability to weight bear.  She was brought to the emergency department and x-rays revealed a distal femur periprosthetic fracture. A CT scan was obtained showing  that the fracture was comminuted and propagated distally to the anterior flange of the knee replacement.  The tibial component of the knee replacement was found to be in varus alignment.  We discussed the risks, benefits, and alternatives to revision to distal femur plates and the patient elected to proceed.  DESCRIPTION OF PROCEDURE IN DETAIL:  I identified the patient in the holding area using 2 identifiers.  The surgical site was marked by myself.  Adductor canal block was placed by Anesthesia.  She was taken to the operating room, placed supine on the operating room table. General anesthesia was induced.  Nonsterile tourniquet was applied to the left groin.  Left lower extremity was prepped and draped in a normal sterile surgical fashion.  Time-out was called verifying side and site of surgery.  I began by examining her left knee.  She had a small superficial abrasion medial to the old incision.  She had a small superficial fracture blister over the inferior aspect of her previous incision.  The Esmarch was used to exsanguinate the lower extremity.  I elevated the tourniquet to 320 mmHg.  I utilized her previous skin incision.  I used a #10 blade to sharply excise her previous scar.  Upon  making the incision, she had a large subcutaneous hematoma that was present over the proximal aspect of the tibia.  She actually had a large degloving injury to the fatty tissue over the proximal tibia.  The fat was degloved from the skin superficially and from the extensor mechanism and proximal tibia deep.  I did create full-thickness skin flaps. Again, over the distal aspect of the medial and lateral wound flaps, the fatty tissue was completely degloved from the skin.  The skin was contused, but appeared viable.  I debrided all the hematoma and then I copiously irrigated all the subcutaneous tissues.  I then made a standard medial parapatellar arthrotomy.  I performed a standard medial release.   I removed the tibial bearing.  Upon doing this, tibial component base plate was found to be very loose and I removed it by hand.  I then turned my attention to the distal femur.  She had a comminuted distal femur periprosthetic fracture.  I gained control of the distal femoral fragment and I reflected this distally in order to very carefully subperiosteally dissect all soft tissue from the posterior aspect of the femur.  Once this was completed, I passed off the distal femur as the specimen.  This will be sent to permanent pathology.  There was no evidence of pathologic fracture on CT scan. I then turned my attention to the femur.  I cleaned up the cut, I measured the total length of the fracture material, and I thought that the 7 cm femur plus a 3 cm diaphyseal component would substitute very nicely.  I reamed up to a 14 with excellent chatter.  I then assembled on the back table a trial femoral component.  I then turned my attention to the proximal tibia.  I used osteotomes to remove the proximal femoral cement.  I used a drill to gain access to the canal of the tibia.  I placed an intramedullary guide.  I freshened the tibial cut to take her out of varus alignment.  I removed just a skim cut of bone laterally.  I then sized the tibia, I pinned the tray, I reamed, and I keel punched. I inserted the trial tibial component.  I then inserted the trial femoral component and the 12 mm bearing.  Patellar height was appropriate.  Patellar tracking was excellent.  Soft tissue tension was appropriate.  I removed all the trial components.  The real components were opened and assembled on the back table.  I copiously irrigated the canals with saline.  They were then brushed, dried, and then the cement was mixed in 2 batches.  With the first batch, I pressurized the tibia after I passed a canal restrictor and then I pressurized the component. I held it in place manually by hand until the cement was  fully hardened. The same identical procedure was then performed on the femur.  Once the glue was fully hardened, I then inserted all the bushings per manufacturer's instructions.  I then put in the 12 mm bearing and then the Yoke and then the final retaining pin.  I took the knee through range of motion.  The patella tracking was excellent.  Patellar height was good.  I was very pleased with the results.  I then closed the arthrotomy over a medium Hemovac drain with #1 Vicryl and #1 Stratafix. I then irrigated the subcutaneous tissues an additional time.  I placed a flat JP over the proximal tibia.  I then did a deep  fatty closure with 0 Vicryl, deep dermal layer with 2-0 interrupted Vicryl, and I closed the skin over the area of concern with 2-0 interrupted nylon with a vertical mattress technique.  Proximally, I used staples.  I then applied the Prevena wound VAC.  Sterile dressing was applied, followed by a compressive wrap and knee immobilizer.  The patient was then extubated and taken to PACU in stable condition.  Sponge, needle, and instrument counts were correct at the end of the case x2.  There were no complications.  I discussed the operative findings with the patient's family.  She will be readmitted to the hospitalist.  She may weight bear as tolerated. Will use the knee immobilizer while she is out of bed in order to help soft tissue healing.  We will retain her drains as long as necessary.  I will see her in the office in 1 week to remove her Prevena wound VAC.  I did explain to them that there is a chance that her skin may not be viable.  If these were the case, then she would experience wound break down, and we would send her to Plastic Surgery for evaluation for medial gastrocs flap.  They understand.  We will go and put her on Lovenox for DVT prophylaxis.  Again, I will see her in 1 week after discharge.          ______________________________ Rod Can,  MD     BS/MEDQ  D:  02/12/2016  T:  02/13/2016  Job:  [id number]

## 2016-02-13 NOTE — Progress Notes (Signed)
CSW met with pt / family this am to assist with d/c planning. PT has recommended HHPT at d/c. Pt is in agreement with this plan. RNCM will assist with d/c planning needs.  Werner Lean LCSW [phone]

## 2016-02-13 NOTE — Progress Notes (Addendum)
PROGRESS NOTE    Susan Haney  [id number] DOB: [birth year] DOA: 02/11/2016 PCP: Octavio Graves, DO   Brief Narrative:  HPI by Dr. Dreama Saa: History of Present Illness:  Patient is a 82 yo female with history of left knee replacement 15 years ago and DMII came with cc of fall that she had today as she tripped at church. She had no dizziness, vertigo, chest pain or dyspnea. She fell forward on her left knee/thigh and complains of pain in her left lower thigh. Otherwise she has no complaints. She has no history of prior falls for the past couple of years.   Patient was found to have a displaced left femoral metaphysis fracture/comminuted periprosthetic left distal femur fracture.  Orthopedics was consulted and counseled patient on options and she decided to have a revision total knee arthroplasty to a distal femur replacement.  This was performed in the late night hours of 02/12/16.  Subjective: The patient is feeling well s/p procedure and is eager to begin her rehab.  PT was scheduled to see her this AM and it seems likely she will require rehab, either inpatient or SNF.  She should be ready for discharge within the next 24 hours.   Assessment & Plan:   Principal Problem:   Closed fracture of left distal femur, initial encounter Active Problems:   Diabetes ([place])   Essential hypertension, benign   Left femoral metaphysis fracture: displaced -Ortho consulted; s/p total knee revision, left -Vicodin prn pain -Reports doing well -Elevated WBC count is likely stress response, hemoglobin also trending down;will recheck CBC in AM -Awaiting PT recommendation and SW assistance for placement - likely tomorrow -Needs orthopedics f/u in 1 week  DM -Continue to hold PO meds -Continue SSI -Reasonable control (highest value is 201)  HTN -Good control -Continue Losartan/HCTZ at the time of discharge  Acute on chronic anemia -Likely exacerbated by acute blood loss peri-procedure -Will  follow  DVT prophylaxis:  Lovenox resumed Code Status: Full Family Communication:  None present Disposition Plan: Inpt rehab vs. SNF, likely tomorrow   Consultants:   Orthopedics  Procedures:   Left total knee revision   Antimicrobials:   Ancef x1 pre-op     Objective: Vitals:   02/13/16 0215 02/13/16 0315 02/13/16 0414 02/13/16 0628  BP: (!) 131/54 (!) 122/57 (!) 125/56 (!) 126/51  Pulse: 98 95 97 84  Resp: 14 14 14 14   Temp: 97.7 F (36.5 C) 97.8 F (36.6 C) 97.6 F (36.4 C) 97.8 F (36.6 C)  TempSrc: Oral Oral Oral Oral  SpO2: 93% 100% 100% 100%  Weight:      Height:        Intake/Output Summary (Last 24 hours) at 02/13/16 1009 Last data filed at 02/13/16 0900  Gross per 24 hour  Intake             3220 ml  Output             1435 ml  Net             1785 ml   Filed Weights   02/12/16 0125  Weight: 94.7 kg (208 lb 12.4 oz)    Examination:  General exam: Appears calm and comfortable  Respiratory system: Clear to auscultation. Respiratory effort normal. Cardiovascular system: S1 & S2 heard, RRR. No JVD, murmurs, rubs, gallops or clicks. No pedal edema. Gastrointestinal system: Abdomen is nondistended, soft and nontender. No organomegaly or masses felt. Normal bowel sounds heard. Central nervous system: Alert and  oriented. No focal neurological deficits. Extremities: L knee in immobilizer Skin: No rashes, lesions or ulcers Psychiatry: Judgement and insight appear normal. Mood & affect appropriate.     Data Reviewed: I have personally reviewed following labs and imaging studies  CBC:  Recent Labs Lab 02/11/16 2206 02/12/16 0406 02/12/16 1612 02/13/16 0435  WBC 13.8* 10.8* 9.0 15.6*  NEUTROABS 11.3*  --  6.0  --   HGB 10.0* 8.8* 8.6* 8.1*  HCT 31.4* 28.1* 27.1* 25.6*  MCV 86.5 86.5 86.9 86.5  PLT 283 279 266 [id number]   Basic Metabolic Panel:  Recent Labs Lab 02/11/16 2206 02/13/16 0435  NA 140 137  K 3.9 4.3  CL 109 106  CO2 24 26    GLUCOSE 123* 167*  BUN 23* 19  CREATININE 0.96 0.97  CALCIUM 9.1 8.3*   GFR: Estimated Creatinine Clearance: 48.9 mL/min (by C-G formula based on SCr of 0.97 mg/dL). Liver Function Tests:  Recent Labs Lab 02/11/16 2206  AST 14*  ALT 12*  ALKPHOS 42  BILITOT 0.4  PROT 6.5  ALBUMIN 3.4*   No results for input(s): LIPASE, AMYLASE in the last 168 hours. No results for input(s): AMMONIA in the last 168 hours. Coagulation Profile: No results for input(s): INR, PROTIME in the last 168 hours. Cardiac Enzymes: No results for input(s): CKTOTAL, CKMB, CKMBINDEX, TROPONINI in the last 168 hours. BNP (last 3 results) No results for input(s): PROBNP in the last 8760 hours. HbA1C: No results for input(s): HGBA1C in the last 72 hours. CBG:  Recent Labs Lab 02/12/16 1136 02/12/16 1700 02/13/16 0056 02/13/16 0732  GLUCAP 99 119* 201* 140*   Lipid Profile: No results for input(s): CHOL, HDL, LDLCALC, TRIG, CHOLHDL, LDLDIRECT in the last 72 hours. Thyroid Function Tests: No results for input(s): TSH, T4TOTAL, FREET4, T3FREE, THYROIDAB in the last 72 hours. Anemia Panel: No results for input(s): VITAMINB12, FOLATE, FERRITIN, TIBC, IRON, RETICCTPCT in the last 72 hours. Urine analysis: No results found for: COLORURINE, APPEARANCEUR, LABSPEC, PHURINE, GLUCOSEU, HGBUR, BILIRUBINUR, KETONESUR, PROTEINUR, UROBILINOGEN, NITRITE, LEUKOCYTESUR Sepsis Labs: @LABRCNTIP (procalcitonin:4,lacticidven:4)  ) Recent Results (from the past 240 hour(s))  Surgical pcr screen     Status: None   Collection Time: 02/12/16  1:35 AM  Result Value Ref Range Status   MRSA, PCR NEGATIVE NEGATIVE Final   Staphylococcus aureus NEGATIVE NEGATIVE Final    Comment:        The Xpert SA Assay (FDA approved for NASAL specimens in patients over 68 years of age), is one component of a comprehensive surveillance program.  Test performance has been validated by [facility] for patients greater than or equal  to 67 year old. It is not intended to diagnose infection nor to guide or monitor treatment.          Radiology Studies: Ct Femur Left Wo Contrast  Result Date: 02/12/2016 CLINICAL DATA:  Evaluate distal femur fracture seen on radiographs. EXAM: CT OF THE LEFT FEMUR WITHOUT CONTRAST TECHNIQUE: Multidetector CT imaging was performed according to the standard protocol. Multiplanar CT image reconstructions were also generated. COMPARISON:  Left knee radiographs - 02/10/2026 FINDINGS: Re- demonstrated comminuted fracture involving the distal metaphysis of the femur with foreshortening and angulation, apex anterior. There are several osseous fragment about the main fracture site with dominant fragment about the anterior medial aspect of main fracture site measuring approximately 4.9 x 0.9 cm (image 45, series 6). There is no definitive extension of the comminuted fracture to involve the femoral component of the patient's  left total knee replacement. Additionally, the tibial component of the total knee replacement appears intact. Expected adjacent soft tissue swelling. Evaluation for knee joint effusion and/or lipohemarthrosis is degraded secondary to streak artifact from the patient's total knee prosthesis. No definite radiopaque foreign body. No additional fractures are identified. Normal appearance of the left hip. Left hip joint spaces are preserved. No evidence avascular necrosis. Several phleboliths are seen within the left hemipelvis. Scattered minimal vascular calcifications within the left superficial femoral artery. IMPRESSION: 1. Re- demonstrated comminuted fracture of the distal metaphysis of the left femur with foreshortening and angulation but without definitive extension to involve the femoral component of the patient's left total knee replacement. 2. No additional fractures identified. Electronically Signed   By: Sandi Mariscal M.D.   On: 02/12/2016 00:48  Dg Knee Complete 4 Views Left  Result  Date: 02/11/2016 CLINICAL DATA:  Status post fall. EXAM: LEFT KNEE - COMPLETE 4+ VIEW COMPARISON:  None. FINDINGS: Left total knee arthroplasty. Comminuted fracture of the distal femoral metaphysis just above the arthroplasty with 2 cm of posterior displacement and 2.5 cm of lateral displacement. No other fracture or dislocation. No aggressive lytic or sclerotic osseous lesion. Soft tissue contusion along the anterolateral aspect of the left knee. IMPRESSION: 1. Comminuted and displaced fracture of the distal left femoral metaphysis just above the left total knee arthroplasty. Electronically Signed   By: Kathreen Devoid   On: 02/11/2016 20:09  Dg Knee Left Port  Result Date: 02/13/2016 CLINICAL DATA:  81 year old female status post revision of left total knee arthroplasty. EXAM: PORTABLE LEFT KNEE - 1-2 VIEW; PORTABLE LEFT TIBIA AND FIBULA - 2 VIEW COMPARISON:  Radiograph dated 02/11/2016 FINDINGS: There has been interval surgical resection of the distal femur with placement of a total knee arthroplasty with long segment of the stents within the distal femur and proximal tibia. The arthroplasty appears in good alignment. There is no acute fracture. A JP drainage catheter is noted along the lateral aspect of the knee. Small amount of air within the anterior compartment of the knee, likely postsurgical. There is diffuse soft tissue edema. Multiple cutaneous surgical clips noted anterior aspect of the knee. IMPRESSION: Interval resection of the distal femur and placement of long stem total knee arthroplasty. The arthroplasty appears in good anatomic alignment. Electronically Signed   By: Anner Crete M.D.   On: 02/13/2016 00:54  Dg Tibia/fibula Left Port  Result Date: 02/13/2016 CLINICAL DATA:  82 year old female status post revision of left total knee arthroplasty. EXAM: PORTABLE LEFT KNEE - 1-2 VIEW; PORTABLE LEFT TIBIA AND FIBULA - 2 VIEW COMPARISON:  Radiograph dated 02/11/2016 FINDINGS: There has been  interval surgical resection of the distal femur with placement of a total knee arthroplasty with long segment of the stents within the distal femur and proximal tibia. The arthroplasty appears in good alignment. There is no acute fracture. A JP drainage catheter is noted along the lateral aspect of the knee. Small amount of air within the anterior compartment of the knee, likely postsurgical. There is diffuse soft tissue edema. Multiple cutaneous surgical clips noted anterior aspect of the knee. IMPRESSION: Interval resection of the distal femur and placement of long stem total knee arthroplasty. The arthroplasty appears in good anatomic alignment. Electronically Signed   By: Anner Crete M.D.   On: 02/13/2016 00:54       Scheduled Meds: . enoxaparin (LOVENOX) injection  40 mg Subcutaneous Daily  . losartan  100 mg Oral Daily   And  .  hydrochlorothiazide  12.5 mg Oral Daily  . insulin aspart  0-9 Units Subcutaneous QID  . pantoprazole  40 mg Oral Daily   Continuous Infusions:    LOS: 1 day    Time spent: 25 minutes    Karmen Bongo, MD [facility]   If 7PM-7AM, please contact night-coverage [email or web address] Password [email or web address] 02/13/2016, 10:09 AM

## 2016-02-13 NOTE — Progress Notes (Signed)
Physical Therapy Treatment Patient Details Name: Susan Haney MRN: [id number] DOB: 1931/07/23 Today's Date: 02/13/2016    History of Present Illness pt adm with Left distal femur (knee) peri-prosthetic fracture; s/p L TK revision on 02/12/16. H/o B TKRs (2003).    PT Comments    Pt very motivated and progressing well with mobility.  Follow Up Recommendations  Home health PT     Equipment Recommendations  None recommended by PT    Recommendations for Other Services OT consult     Precautions / Restrictions Precautions Precautions: Knee;Fall Precaution Comments: no knee ROM; multiple drains and incision vac in place Required Braces or Orthoses: Knee Immobilizer - Left Knee Immobilizer - Left: On at all times Restrictions Weight Bearing Restrictions: No LLE Weight Bearing: Weight bearing as tolerated    Mobility  Bed Mobility Overal bed mobility: Needs Assistance Bed Mobility: Sit to Supine     Supine to sit: Min assist;+2 for safety/equipment;+2 for physical assistance;HOB elevated Sit to supine: Min assist   General bed mobility comments: cues for sequence and use of R LE to self assist  Transfers Overall transfer level: Needs assistance Equipment used: Rolling walker (2 wheeled) Transfers: Sit to/from Stand Sit to Stand: Min assist;Mod assist         General transfer comment: cues for LE management and use of UEs to self assist  Ambulation/Gait Ambulation/Gait assistance: Min assist Ambulation Distance (Feet): 38 Feet Assistive device: Rolling walker (2 wheeled) Gait Pattern/deviations: Step-to pattern;Decreased step length - right;Decreased step length - left;Shuffle;Trunk flexed Gait velocity: decr Gait velocity interpretation: Below normal speed for age/gender General Gait Details: cues for sequence, posture and position from [employer]            Wheelchair Mobility    Modified Rankin (Stroke Patients Only)       Balance                                    Cognition Arousal/Alertness: Awake/alert Behavior During Therapy: WFL for tasks assessed/performed Overall Cognitive Status: Within Functional Limits for tasks assessed                      Exercises Total Joint Exercises Ankle Circles/Pumps: AROM;Both;15 reps;Supine    General Comments General comments (skin integrity, edema, etc.): wound VAC and 2 other drains      Pertinent Vitals/Pain Pain Assessment: 0-10 Pain Score: 3  Pain Location: L knee  Pain Descriptors / Indicators: Aching;Sore Pain Intervention(s): Limited activity within patient's tolerance;Monitored during session;Premedicated before session;Ice applied    Home Living Family/patient expects to be discharged to:: Private residence Living Arrangements: Alone Available Help at Discharge: Family;Available 24 hours/day Type of Home: House Home Access: Ramped entrance   Home Layout: One level Home Equipment: Walker - 2 wheels;Bedside commode      Prior Function Level of Independence: Independent      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   PT Goals (current goals can now be found in the care plan section) Acute Rehab PT Goals Patient Stated Goal: to go home with sister's assistance PT Goal Formulation: With patient Potential to Achieve Goals: Good Progress towards PT goals: Progressing toward goals    Frequency  7X/week    PT Plan Current plan remains appropriate    Co-evaluation PT/OT/SLP Co-Evaluation/Treatment: Yes Reason for Co-Treatment: For patient/therapist safety PT goals addressed during session: Mobility/safety with  mobility OT goals addressed during session: ADL's and self-care     End of Session Equipment Utilized During Treatment: Gait belt;Left knee immobilizer Activity Tolerance: Patient tolerated treatment well Patient left: in bed;with call bell/phone within reach;with family/visitor present     Time: 1417-1440 PT Time Calculation (min)  (ACUTE ONLY): 23 min  Charges:  $Gait Training: 23-37 mins                    G Codes:      Tamsin Nader 2016-03-10, 5:50 PM

## 2016-02-13 NOTE — Progress Notes (Signed)
Occupational Therapy Evaluation Patient Details Name: Susan Haney MRN: [id number] DOB: 01/15/1932 Today's Date: 02/13/2016    History of Present Illness pt adm with Left distal femur (knee) peri-prosthetic fracture; s/p L TK revision on 02/12/16. H/o B TKRs (2003).   Clinical Impression   Patient presents to OT with decreased ADL independence and safety due to the deficits listed below. She will benefit from skilled OT to maximize function and to facilitate a safe discharge. OT will follow.    Follow Up Recommendations  Home health OT;Supervision/Assistance - 24 hour    Equipment Recommendations  None recommended by OT    Recommendations for Other Services       Precautions / Restrictions Precautions Precautions: Knee;Fall Precaution Comments: no knee ROM Required Braces or Orthoses: Knee Immobilizer - Left Restrictions Weight Bearing Restrictions: No LLE Weight Bearing: Weight bearing as tolerated      Mobility Bed Mobility Overal bed mobility: Needs Assistance Bed Mobility: Supine to Sit     Supine to sit: Min assist;+2 for safety/equipment;+2 for physical assistance;HOB elevated        Transfers Overall transfer level: Needs assistance Equipment used: Rolling walker (2 wheeled) Transfers: Sit to/from Stand Sit to Stand: Min assist;+2 physical assistance;+2 safety/equipment              Balance                                            ADL Overall ADL's : Needs assistance/impaired Eating/Feeding: Independent;Sitting   Grooming: Set up;Sitting   Upper Body Bathing: Minimal assitance;Sitting   Lower Body Bathing: Maximal assistance;Sit to/from stand       Lower Body Dressing: Total assistance Lower Body Dressing Details (indicate cue type and reason): don socks   Toilet Transfer Details (indicate cue type and reason): still has foley         Functional mobility during ADLs: Minimal assistance;+2 for  safety/equipment;+2 for physical assistance;Rolling walker General ADL Comments: Patient practiced bed mobility, sit to stand, took a few steps, then became dizzy. Recliner pulled up to patient and patient positioned comfortably in chair. Dizziness resolved once patient was seated. Patient set up for lunch.     Vision     Perception     Praxis      Pertinent Vitals/Pain Pain Assessment: 0-10 Pain Score: 0-No pain     Hand Dominance     Extremity/Trunk Assessment Upper Extremity Assessment Upper Extremity Assessment: Overall WFL for tasks assessed   Lower Extremity Assessment Lower Extremity Assessment: Defer to PT evaluation       Communication Communication Communication: No difficulties   Cognition Arousal/Alertness: Awake/alert Behavior During Therapy: WFL for tasks assessed/performed Overall Cognitive Status: Within Functional Limits for tasks assessed                     General Comments       Exercises       Shoulder Instructions      Home Living Family/patient expects to be discharged to:: Private residence Living Arrangements: Alone Available Help at Discharge: Family;Available 24 hours/day (sister) Type of Home: House Home Access: Ramped entrance     Home Layout: One level     Bathroom Shower/Tub: Other (comment) (walk-in tub)   Bathroom Toilet: Standard Bathroom Accessibility: Yes How Accessible: Accessible via walker Home Equipment: [place] - 2 wheels;Bedside commode  Prior Functioning/Environment Level of Independence: Independent        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    OT Diagnosis: Acute pain   OT Problem List: Decreased strength;Decreased range of motion;Impaired balance (sitting and/or standing);Decreased knowledge of use of DME or AE;Pain;Decreased knowledge of precautions   OT Treatment/Interventions: Self-care/ADL training;DME and/or AE instruction;Therapeutic activities;Patient/family education    OT  Goals(Current goals can be found in the care plan section) Acute Rehab OT Goals Patient Stated Goal: to go home with sister's assistance OT Goal Formulation: With patient Time For Goal Achievement: 02/20/16 ADL Goals Pt Will Perform Lower Body Bathing: with supervision;with adaptive equipment;sit to/from stand Pt Will Perform Lower Body Dressing: with supervision;with adaptive equipment;sit to/from stand Pt Will Transfer to Toilet: with supervision;bedside commode;ambulating Pt Will Perform Toileting - Clothing Manipulation and hygiene: with supervision;sit to/from stand  OT Frequency: Min 2X/week   Barriers to D/C:            Co-evaluation              End of Session Equipment Utilized During Treatment: Rolling walker;Left knee immobilizer Nurse Communication: Mobility status  Activity Tolerance: Patient tolerated treatment well Patient left: in chair;with call bell/phone within reach;with chair alarm set;with family/visitor present   Time: [id number] OT Time Calculation (min): 22 min Charges:  OT General Charges $OT Visit: 1 Procedure OT Evaluation $OT Eval Moderate Complexity: 1 Procedure G-Codes:    Parnell Spieler A March 10, 2016, 2:52 PM

## 2016-02-13 NOTE — Anesthesia Postprocedure Evaluation (Addendum)
Anesthesia Post Note  Patient: Susan Haney  Procedure(s) Performed: Procedure(s) (LRB): LEFT TOTAL KNEE REVISION (Left)  Patient location during evaluation: PACU Anesthesia Type: General Level of consciousness: sedated Pain management: pain level controlled Vital Signs Assessment: post-procedure vital signs reviewed and stable Respiratory status: spontaneous breathing and respiratory function stable Cardiovascular status: stable Anesthetic complications: no               Arnel Wymer DANIEL

## 2016-02-14 DIAGNOSIS — S72402A Unspecified fracture of lower end of left femur, initial encounter for closed fracture: Secondary | ICD-10-CM

## 2016-02-14 LAB — CBC WITH DIFFERENTIAL/PLATELET
Basophils Absolute: 0 10*3/uL (ref 0.0–0.1)
Basophils Relative: 0 %
EOS ABS: 0.5 10*3/uL (ref 0.0–0.7)
Eosinophils Relative: 4 %
HCT: 21.9 % — ABNORMAL LOW (ref 36.0–46.0)
HEMOGLOBIN: 7 g/dL — AB (ref 12.0–15.0)
LYMPHS ABS: 1.7 10*3/uL (ref 0.7–4.0)
Lymphocytes Relative: 16 %
MCH: 27.7 pg (ref 26.0–34.0)
MCHC: 32 g/dL (ref 30.0–36.0)
MCV: 86.6 fL (ref 78.0–100.0)
MONOS PCT: 10 %
Monocytes Absolute: 1.1 10*3/uL — ABNORMAL HIGH (ref 0.1–1.0)
NEUTROS PCT: 70 %
Neutro Abs: 7.8 10*3/uL — ABNORMAL HIGH (ref 1.7–7.7)
Platelets: 204 10*3/uL (ref 150–400)
RBC: 2.53 MIL/uL — AB (ref 3.87–5.11)
RDW: 14.9 % (ref 11.5–15.5)
WBC: 11.1 10*3/uL — AB (ref 4.0–10.5)

## 2016-02-14 LAB — GLUCOSE, CAPILLARY
GLUCOSE-CAPILLARY: 117 mg/dL — AB (ref 65–99)
GLUCOSE-CAPILLARY: 184 mg/dL — AB (ref 65–99)
Glucose-Capillary: 161 mg/dL — ABNORMAL HIGH (ref 65–99)
Glucose-Capillary: 131 mg/dL — ABNORMAL HIGH (ref 65–99)

## 2016-02-14 LAB — BASIC METABOLIC PANEL
Anion gap: 5 (ref 5–15)
BUN: 19 mg/dL (ref 6–20)
CHLORIDE: 110 mmol/L (ref 101–111)
CO2: 26 mmol/L (ref 22–32)
CREATININE: 1.09 mg/dL — AB (ref 0.44–1.00)
Calcium: 8.4 mg/dL — ABNORMAL LOW (ref 8.9–10.3)
GFR calc Af Amer: 53 mL/min — ABNORMAL LOW (ref >60)
GFR calc non Af Amer: 46 mL/min — ABNORMAL LOW (ref >60)
Glucose, Bld: 122 mg/dL — ABNORMAL HIGH (ref 65–99)
POTASSIUM: 3.9 mmol/L (ref 3.5–5.1)
Sodium: 141 mmol/L (ref 135–145)

## 2016-02-14 LAB — PREPARE RBC (CROSSMATCH)

## 2016-02-14 MED ORDER — ACETAMINOPHEN 325 MG PO TABS
650.0000 mg | ORAL_TABLET | Freq: Once | ORAL | Status: AC
  Filled 2016-02-14: qty 2

## 2016-02-14 MED ORDER — FUROSEMIDE 10 MG/ML IJ SOLN
10.0000 mg | Freq: Once | INTRAMUSCULAR | Status: AC
  Administered 2016-02-14: 10 mg via INTRAVENOUS
  Filled 2016-02-14: qty 1

## 2016-02-14 MED ORDER — SODIUM CHLORIDE 0.9 % IV SOLN
Freq: Once | INTRAVENOUS | Status: AC
Start: 2016-02-14 — End: 2016-02-14
  Administered 2016-02-14: 10:00:00 via INTRAVENOUS

## 2016-02-14 NOTE — Progress Notes (Signed)
Physical Therapy Treatment Patient Details Name: Susan Haney MRN: [id number] DOB: Jul 09, 1932 Today's Date: 02/14/2016    History of Present Illness pt adm with Left distal femur (knee) peri-prosthetic fracture; s/p L TK revision on 02/12/16. H/o B TKRs (2003).    PT Comments    Pt cooperative but ltd this am by dizziness/nausea with ambulation.  RN and PA aware.  Follow Up Recommendations  Home health PT     Equipment Recommendations  None recommended by PT    Recommendations for Other Services OT consult     Precautions / Restrictions Precautions Precautions: Knee;Fall Precaution Comments: no knee ROM; multiple drains and incision vac in place Required Braces or Orthoses: Knee Immobilizer - Left Knee Immobilizer - Left: On at all times Restrictions Weight Bearing Restrictions: No LLE Weight Bearing: Weight bearing as tolerated    Mobility  Bed Mobility Overal bed mobility: Needs Assistance Bed Mobility: Supine to Sit     Supine to sit: Min assist;+2 for safety/equipment;+2 for physical assistance;HOB elevated     General bed mobility comments: cues for sequence and use of R LE to self assist  Transfers Overall transfer level: Needs assistance Equipment used: Rolling walker (2 wheeled) Transfers: Sit to/from Stand Sit to Stand: Min assist;Mod assist         General transfer comment: cues for LE management and use of UEs to self assist  Ambulation/Gait Ambulation/Gait assistance: Min assist;+2 physical assistance Ambulation Distance (Feet): 8 Feet Assistive device: Rolling walker (2 wheeled) Gait Pattern/deviations: Step-to pattern;Decreased step length - right;Decreased step length - left;Shuffle;Trunk flexed Gait velocity: decr Gait velocity interpretation: Below normal speed for age/gender General Gait Details: cues for sequence, posture and position from RW; distance ltd by nausea/dizziness - BP 141/85 - RN aware    Stairs             Wheelchair Mobility    Modified Rankin (Stroke Patients Only)       Balance                                    Cognition Arousal/Alertness: Awake/alert Behavior During Therapy: WFL for tasks assessed/performed Overall Cognitive Status: Within Functional Limits for tasks assessed                      Exercises      General Comments        Pertinent Vitals/Pain Pain Assessment: 0-10 Pain Score: 4  Pain Location: L knee Pain Descriptors / Indicators: Aching;Sore Pain Intervention(s): Limited activity within patient's tolerance;Monitored during session;Premedicated before session;Ice applied    Home Living                      Prior Function            PT Goals (current goals can now be found in the care plan section) Acute Rehab PT Goals Patient Stated Goal: to go home with sister's assistance PT Goal Formulation: With patient Potential to Achieve Goals: Good Progress towards PT goals: Progressing toward goals    Frequency  7X/week    PT Plan Current plan remains appropriate    Co-evaluation             End of Session Equipment Utilized During Treatment: Gait belt;Left knee immobilizer Activity Tolerance: Patient tolerated treatment well Patient left: in chair;with call bell/phone within reach     Time: [phone]  PT Time Calculation (min) (ACUTE ONLY): 23 min  Charges:  $Gait Training: 23-37 mins                    G Codes:      Susan Haney Feb 28, 2016, 12:27 PM

## 2016-02-14 NOTE — Progress Notes (Signed)
Writer spoke with PA, Dian Situ, at this time regarding pt's IV status. Pt has 2 failed IV sites of today with IV team finding only one spot with [place] today for pt to receive one unit PRBC. The pt's site was very sore during last half of transfusion and has since slightly bruised. IV team reassessed site and is unable to find another site.  Pt is now requesting not to receive another IV; saying that her dizziness from this morning has improved. Pt did walk with PT in hallway without dizziness after receiving one unit PRBC. Per Dian Situ, we will reassess in morning after a.m. Labs. Pt ok to remain without IV at this time.

## 2016-02-14 NOTE — Progress Notes (Signed)
PROGRESS NOTE    Susan Haney  [id number] DOB: 07-Sep-1931 DOA: 02/11/2016 PCP: Octavio Graves, DO   Brief Narrative:  HPI by Dr. Dreama Saa: History of Present Illness:  Patient is a 82 yo female with history of left knee replacement 15 years ago and DMII came with cc of fall that she had today as she tripped at church. She had no dizziness, vertigo, chest pain or dyspnea. She fell forward on her left knee/thigh and complains of pain in her left lower thigh. Otherwise she has no complaints. She has no history of prior falls for the past couple of years.   Patient was found to have a displaced left femoral metaphysis fracture/comminuted periprosthetic left distal femur fracture.  Orthopedics was consulted and counseled patient on options and she decided to have a revision total knee arthroplasty to a distal femur replacement.  This was performed in the late night hours of 02/12/16.  Subjective: Patient states no concerns overnight. However, this morning when attempting to start therapy, she experienced lightheadedness. She reports no chest pain, palpitations, shortness of breath or abdominal pain. She has pain in her left knee which is controlled with analgesics. No hemoptysis, bloody emesis, or dark stools.  Assessment & Plan:   Principal Problem:   Closed fracture of left distal femur, initial encounter Active Problems:   Diabetes ([place])   Essential hypertension, benign   Left femoral metaphysis fracture: displaced -Ortho consulted; s/p total knee revision, left -Vicodin prn pain -Reports doing well -Elevated WBC count is likely stress response, hemoglobin also trending down;will recheck CBC in AM -Awaiting PT recommendation and SW assistance for placement - likely tomorrow -Needs orthopedics f/u in 1 week  Symptomatic anemia, likely secondary to blood loss -No evidence of active bleeding -Surgery as are the seen this and will administer 2 units of packed red blood cells -Recheck  CBC 2 hours after transfusion completes  DM -Continue to hold PO meds -Continue SSI - No hypoglycemia  HTN -Good control -Continue Losartan/HCTZ   DVT prophylaxis:  Lovenox Code Status: Full Family Communication:  None present Disposition Plan: Home with home health physical therapy, likely tomorrow 02/15/2016   Consultants:   Orthopedics  Procedures:   Left total knee revision   Antimicrobials:   Ancef x1 pre-op     Objective: Vitals:   02/14/16 0642 02/14/16 1134 02/14/16 1155 02/14/16 1225  BP: (!) 127/52  (!) 129/48 (!) 116/59  Pulse: 86 79 76 83  Resp: 18  18 18   Temp: 100.2 F (37.9 C)  98.4 F (36.9 C) 99.9 F (37.7 C)  TempSrc: Oral  Oral Oral  SpO2: 98% 98% 97% 99%  Weight:      Height:        Intake/Output Summary (Last 24 hours) at 02/14/16 1257 Last data filed at 02/14/16 1210  Gross per 24 hour  Intake           2183.5 ml  Output             2120 ml  Net             63.5 ml   Filed Weights   02/12/16 0125  Weight: 94.7 kg (208 lb 12.4 oz)    Examination:  General exam: Appears calm and comfortable  Respiratory system: Clear to auscultation. Respiratory effort normal. Cardiovascular system: S1 & S2 heard, RRR. No JVD, murmurs, rubs, gallops or clicks. No pedal edema. Gastrointestinal system: Abdomen is nondistended, soft and nontender. No organomegaly or masses  felt. Normal bowel sounds heard. Central nervous system: Alert and oriented. No focal neurological deficits. Extremities: L knee in immobilizer Skin: No rashes, lesions or ulcers Psychiatry: Judgement and insight appear normal. Mood & affect appropriate.     Data Reviewed: I have personally reviewed following labs and imaging studies  CBC:  Recent Labs Lab 02/11/16 2206 02/12/16 0406 02/12/16 1612 02/13/16 0435 02/14/16 0354  WBC 13.8* 10.8* 9.0 15.6* 11.1*  NEUTROABS 11.3*  --  6.0  --  7.8*  HGB 10.0* 8.8* 8.6* 8.1* 7.0*  HCT 31.4* 28.1* 27.1* 25.6* 21.9*    MCV 86.5 86.5 86.9 86.5 86.6  PLT 283 279 266 234 0000000   Basic Metabolic Panel:  Recent Labs Lab 02/11/16 2206 02/13/16 0435 02/14/16 0354  NA 140 137 141  K 3.9 4.3 3.9  CL 109 106 110  CO2 24 26 26   GLUCOSE 123* 167* 122*  BUN 23* 19 19  CREATININE 0.96 0.97 1.09*  CALCIUM 9.1 8.3* 8.4*   GFR: Estimated Creatinine Clearance: 43.5 mL/min (by C-G formula based on SCr of 1.09 mg/dL). Liver Function Tests:  Recent Labs Lab 02/11/16 2206  AST 14*  ALT 12*  ALKPHOS 42  BILITOT 0.4  PROT 6.5  ALBUMIN 3.4*   No results for input(s): LIPASE, AMYLASE in the last 168 hours. No results for input(s): AMMONIA in the last 168 hours. Coagulation Profile: No results for input(s): INR, PROTIME in the last 168 hours. Cardiac Enzymes: No results for input(s): CKTOTAL, CKMB, CKMBINDEX, TROPONINI in the last 168 hours. BNP (last 3 results) No results for input(s): PROBNP in the last 8760 hours. HbA1C: No results for input(s): HGBA1C in the last 72 hours. CBG:  Recent Labs Lab 02/13/16 1250 02/13/16 1720 02/13/16 2208 02/14/16 0730 02/14/16 1209  GLUCAP 125* 133* 119* 117* 184*   Lipid Profile: No results for input(s): CHOL, HDL, LDLCALC, TRIG, CHOLHDL, LDLDIRECT in the last 72 hours. Thyroid Function Tests: No results for input(s): TSH, T4TOTAL, FREET4, T3FREE, THYROIDAB in the last 72 hours. Anemia Panel: No results for input(s): VITAMINB12, FOLATE, FERRITIN, TIBC, IRON, RETICCTPCT in the last 72 hours. Urine analysis: No results found for: COLORURINE, APPEARANCEUR, LABSPEC, PHURINE, GLUCOSEU, HGBUR, BILIRUBINUR, KETONESUR, PROTEINUR, UROBILINOGEN, NITRITE, LEUKOCYTESUR Sepsis Labs: @LABRCNTIP (procalcitonin:4,lacticidven:4)  ) Recent Results (from the past 240 hour(s))  Surgical pcr screen     Status: None   Collection Time: 02/12/16  1:35 AM  Result Value Ref Range Status   MRSA, PCR NEGATIVE NEGATIVE Final   Staphylococcus aureus NEGATIVE NEGATIVE Final     Comment:        The Xpert SA Assay (FDA approved for NASAL specimens in patients over 68 years of age), is one component of a comprehensive surveillance program.  Test performance has been validated by [facility] for patients greater than or equal to 25 year old. It is not intended to diagnose infection nor to guide or monitor treatment.          Radiology Studies: Dg Knee Left Port  Result Date: 02/13/2016 CLINICAL DATA:  82 year old female status post revision of left total knee arthroplasty. EXAM: PORTABLE LEFT KNEE - 1-2 VIEW; PORTABLE LEFT TIBIA AND FIBULA - 2 VIEW COMPARISON:  Radiograph dated 02/11/2016 FINDINGS: There has been interval surgical resection of the distal femur with placement of a total knee arthroplasty with long segment of the stents within the distal femur and proximal tibia. The arthroplasty appears in good alignment. There is no acute fracture. A JP drainage catheter is  noted along the lateral aspect of the knee. Small amount of air within the anterior compartment of the knee, likely postsurgical. There is diffuse soft tissue edema. Multiple cutaneous surgical clips noted anterior aspect of the knee. IMPRESSION: Interval resection of the distal femur and placement of long stem total knee arthroplasty. The arthroplasty appears in good anatomic alignment. Electronically Signed   By: Anner Crete M.D.   On: 02/13/2016 00:54  Dg Tibia/fibula Left Port  Result Date: 02/13/2016 CLINICAL DATA:  82 year old female status post revision of left total knee arthroplasty. EXAM: PORTABLE LEFT KNEE - 1-2 VIEW; PORTABLE LEFT TIBIA AND FIBULA - 2 VIEW COMPARISON:  Radiograph dated 02/11/2016 FINDINGS: There has been interval surgical resection of the distal femur with placement of a total knee arthroplasty with long segment of the stents within the distal femur and proximal tibia. The arthroplasty appears in good alignment. There is no acute fracture. A JP drainage catheter is  noted along the lateral aspect of the knee. Small amount of air within the anterior compartment of the knee, likely postsurgical. There is diffuse soft tissue edema. Multiple cutaneous surgical clips noted anterior aspect of the knee. IMPRESSION: Interval resection of the distal femur and placement of long stem total knee arthroplasty. The arthroplasty appears in good anatomic alignment. Electronically Signed   By: Anner Crete M.D.   On: 02/13/2016 00:54       Scheduled Meds: . enoxaparin (LOVENOX) injection  40 mg Subcutaneous Daily  . furosemide  10 mg Intravenous Once  . losartan  100 mg Oral Daily   And  . hydrochlorothiazide  12.5 mg Oral Daily  . insulin aspart  0-9 Units Subcutaneous QID  . pantoprazole  40 mg Oral Daily   Continuous Infusions: . lactated ringers 50 mL/hr at 02/13/16 1221     LOS: 2 days    Time spent: 25 minutes    Cordelia Poche, MD [facility]   If 7PM-7AM, please contact night-coverage [email or web address] Password [facility] 02/14/2016, 12:57 PM

## 2016-02-14 NOTE — Progress Notes (Signed)
Patient became lightheaded and dizzy with first attempt with therapy.  Will give two units of blood today.

## 2016-02-14 NOTE — Progress Notes (Signed)
Physical Therapy Treatment Patient Details Name: Susan Haney MRN: [id number] DOB: 03-28-1932 Today's Date: 02/14/2016    History of Present Illness pt adm with Left distal femur (knee) peri-prosthetic fracture; s/p L TK revision on 02/12/16. H/o B TKRs (2003).    PT Comments    Pt motivated and feeling better than am with no c/o dizziness  Follow Up Recommendations  Home health PT     Equipment Recommendations  None recommended by PT    Recommendations for Other Services OT consult     Precautions / Restrictions Precautions Precautions: Knee;Fall Precaution Comments: no knee ROM; multiple drains and incision vac in place Required Braces or Orthoses: Knee Immobilizer - Left Knee Immobilizer - Left: On at all times Restrictions Weight Bearing Restrictions: No LLE Weight Bearing: Weight bearing as tolerated    Mobility  Bed Mobility Overal bed mobility: Needs Assistance Bed Mobility: Sit to Supine       Sit to supine: Min assist   General bed mobility comments: cues for sequence and use of R LE to self assist  Transfers Overall transfer level: Needs assistance Equipment used: Rolling walker (2 wheeled) Transfers: Sit to/from Stand Sit to Stand: Min assist;Mod assist         General transfer comment: cues for LE management and use of UEs to self assist  Ambulation/Gait Ambulation/Gait assistance: Min assist;+2 physical assistance;+2 safety/equipment Ambulation Distance (Feet): 40 Feet Assistive device: Rolling walker (2 wheeled) Gait Pattern/deviations: Step-to pattern;Decreased step length - right;Decreased step length - left;Shuffle;Trunk flexed Gait velocity: decr   General Gait Details: cues for sequence, posture and position from RW; distance ltd by nausea/dizziness - BP 141/85 - RN aware    Stairs            Wheelchair Mobility    Modified Rankin (Stroke Patients Only)       Balance Overall balance assessment: Needs  assistance Sitting-balance support: No upper extremity supported;Feet supported Sitting balance-Leahy Scale: Good     Standing balance support: Bilateral upper extremity supported Standing balance-Leahy Scale: Poor                      Cognition Arousal/Alertness: Awake/alert Behavior During Therapy: WFL for tasks assessed/performed Overall Cognitive Status: Within Functional Limits for tasks assessed                      Exercises      General Comments        Pertinent Vitals/Pain Pain Assessment: 0-10 Pain Score: 4  Pain Location: L knee Pain Descriptors / Indicators: Aching;Sore Pain Intervention(s): Limited activity within patient's tolerance;Monitored during session;Premedicated before session;Ice applied    Home Living                      Prior Function            PT Goals (current goals can now be found in the care plan section) Acute Rehab PT Goals Patient Stated Goal: to go home with sister's assistance PT Goal Formulation: With patient Potential to Achieve Goals: Good Progress towards PT goals: Progressing toward goals    Frequency  7X/week    PT Plan Current plan remains appropriate    Co-evaluation             End of Session Equipment Utilized During Treatment: Gait belt;Left knee immobilizer Activity Tolerance: Patient tolerated treatment well Patient left: in bed;with call bell/phone within reach;with family/visitor present  Time: [id number] PT Time Calculation (min) (ACUTE ONLY): 29 min  Charges:  $Gait Training: 23-37 mins                    G Codes:      Shawntina Diffee 2016/03/01, 4:30 PM

## 2016-02-14 NOTE — Progress Notes (Signed)
Felipe Drone PA called reg hgb 7.0 this morning. No orders recieved

## 2016-02-14 NOTE — Progress Notes (Signed)
OT Cancellation Note  Patient Details Name: Susan Haney MRN: [id number] DOB: 12/27/1931   Cancelled Treatment:    Reason Eval/Treat Not Completed: Medical issues which prohibited therapy. HgB 7 and pt symptomatic. Plan to receive blood. Will see later this pm.   [place], OTR/L  [phone] 02/14/2016 02/14/2016, 9:26 AM

## 2016-02-14 NOTE — Progress Notes (Addendum)
   Subjective: 2 Days Post-Op Procedure(s) (LRB): LEFT TOTAL KNEE REVISION (Left) Patient reports pain as mild.   Patient seen in rounds with Dr. Lyla Glassing. Patient is well, but has had some minor complaints of pain in the knee, requiring pain medications HGB is down to 7.0 but she is asymptomatic at this time.  Will monitor the progress. Possible home tomorrow if improves and can arrange for someone to be with her at home.  Objective: Vital signs in last 24 hours: Temp:  [98 F (36.7 C)-100.2 F (37.9 C)] 100.2 F (37.9 C) (07/29 [id number]) Pulse Rate:  [78-92] 86 (07/29 0642) Resp:  [16-18] 18 (07/29 0642) BP: (111-136)/(47-64) 127/52 (07/29 0642) SpO2:  [87 %-100 %] 98 % (07/29 0642)  Intake/Output from previous day:  Intake/Output Summary (Last 24 hours) at 02/14/16 0745 Last data filed at 02/14/16 [id number]  Gross per 24 hour  Intake           1837.5 ml  Output             2140 ml  Net           -302.5 ml    Intake/Output this shift: No intake/output data recorded.  Labs:  Recent Labs  02/11/16 2206 02/12/16 0406 02/12/16 1612 02/13/16 0435 02/14/16 0354  HGB 10.0* 8.8* 8.6* 8.1* 7.0*    Recent Labs  02/13/16 0435 02/14/16 0354  WBC 15.6* 11.1*  RBC 2.96* 2.53*  HCT 25.6* 21.9*  PLT 234 204    Recent Labs  02/13/16 0435 02/14/16 0354  NA 137 141  K 4.3 3.9  CL 106 110  CO2 26 26  BUN 19 19  CREATININE 0.97 1.09*  GLUCOSE 167* 122*  CALCIUM 8.3* 8.4*   No results for input(s): LABPT, INR in the last 72 hours.  EXAM: General - Patient is Alert and Appropriate Extremity - Neurovascular intact Sensation intact distally Wound VAC - clean, dry Motor Function - intact, moving foot and toes well on exam.   Assessment/Plan: 2 Days Post-Op Procedure(s) (LRB): LEFT TOTAL KNEE REVISION (Left) Procedure(s) (LRB): LEFT TOTAL KNEE REVISION (Left) Past Medical History:  Diagnosis Date  . Anemia    history ,  took iron for years  . Arthritis   . Chronic  back pain   . Diabetes ([place])    x 15 yrs with good control  . GERD (gastroesophageal reflux disease)   . Headache(784.0)   . Hypertension    Principal Problem:   Closed fracture of left distal femur, initial encounter Active Problems:   Diabetes ([place])   Essential hypertension, benign  Estimated body mass index is 36.98 kg/m as calculated from the following:   Height as of this encounter: 5\' 3"  (1.6 m).   Weight as of this encounter: 94.7 kg (208 lb 12.4 oz). Up with therapy Diet - Cardiac diet and Diabetic diet Activity - WBAT Follow up with Dr. Lyla Glassing next WED or Thursday in the office. Disposition - Home versus SNF DVT Prophylaxis - Lovenox  Monitor the HGB and the progress with therapy.  Arlee Muslim, PA-C Orthopaedic Surgery 02/14/2016, 7:45 AM

## 2016-02-15 ENCOUNTER — Inpatient Hospital Stay (HOSPITAL_COMMUNITY): Payer: Medicare Other

## 2016-02-15 DIAGNOSIS — E119 Type 2 diabetes mellitus without complications: Secondary | ICD-10-CM

## 2016-02-15 DIAGNOSIS — I10 Essential (primary) hypertension: Secondary | ICD-10-CM

## 2016-02-15 LAB — URINALYSIS, ROUTINE W REFLEX MICROSCOPIC
BILIRUBIN URINE: NEGATIVE
Glucose, UA: NEGATIVE mg/dL
HGB URINE DIPSTICK: NEGATIVE
KETONES UR: NEGATIVE mg/dL
Leukocytes, UA: NEGATIVE
NITRITE: NEGATIVE
PROTEIN: NEGATIVE mg/dL
SPECIFIC GRAVITY, URINE: 1.015 (ref 1.005–1.030)
pH: 7 (ref 5.0–8.0)

## 2016-02-15 LAB — GLUCOSE, CAPILLARY
GLUCOSE-CAPILLARY: 137 mg/dL — AB (ref 65–99)
GLUCOSE-CAPILLARY: 160 mg/dL — AB (ref 65–99)
Glucose-Capillary: 242 mg/dL — ABNORMAL HIGH (ref 65–99)
Glucose-Capillary: 119 mg/dL — ABNORMAL HIGH (ref 65–99)
Glucose-Capillary: 113 mg/dL — ABNORMAL HIGH (ref 65–99)

## 2016-02-15 LAB — TYPE AND SCREEN
ABO/RH(D): O NEG
ANTIBODY SCREEN: NEGATIVE
UNIT DIVISION: 0
Unit division: 0

## 2016-02-15 LAB — BASIC METABOLIC PANEL
ANION GAP: 6 (ref 5–15)
BUN: 20 mg/dL (ref 6–20)
CALCIUM: 8.4 mg/dL — AB (ref 8.9–10.3)
CO2: 27 mmol/L (ref 22–32)
Chloride: 106 mmol/L (ref 101–111)
Creatinine, Ser: 0.88 mg/dL (ref 0.44–1.00)
GFR calc Af Amer: >60 mL/min (ref >60)
GFR, EST NON AFRICAN AMERICAN: 60 mL/min — AB (ref >60)
GLUCOSE: 113 mg/dL — AB (ref 65–99)
POTASSIUM: 3.7 mmol/L (ref 3.5–5.1)
SODIUM: 139 mmol/L (ref 135–145)

## 2016-02-15 LAB — CBC
HCT: 24.9 % — ABNORMAL LOW (ref 36.0–46.0)
Hemoglobin: 8 g/dL — ABNORMAL LOW (ref 12.0–15.0)
MCH: 27.4 pg (ref 26.0–34.0)
MCHC: 32.1 g/dL (ref 30.0–36.0)
MCV: 85.3 fL (ref 78.0–100.0)
PLATELETS: 215 10*3/uL (ref 150–400)
RBC: 2.92 MIL/uL — AB (ref 3.87–5.11)
RDW: 15 % (ref 11.5–15.5)
WBC: 12.8 10*3/uL — AB (ref 4.0–10.5)

## 2016-02-15 MED ORDER — HYDROCODONE-ACETAMINOPHEN 5-325 MG PO TABS: 1.0000 | ORAL_TABLET | Freq: Four times a day (QID) | ORAL | 0 refills | Status: DC | PRN

## 2016-02-15 MED ORDER — ENOXAPARIN SODIUM 40 MG/0.4ML ~~LOC~~ SOLN: 40.0000 mg | Freq: Every day | SUBCUTANEOUS | 0 refills | Status: DC

## 2016-02-15 MED ORDER — METHOCARBAMOL 500 MG PO TABS: 500.0000 mg | ORAL_TABLET | Freq: Four times a day (QID) | ORAL | 0 refills | Status: DC | PRN

## 2016-02-15 NOTE — Progress Notes (Signed)
   02/15/16 1200  PT Visit Information  Last PT Received On 02/15/16  Assistance Needed +1  History of Present Illness pt adm with Left distal femur (knee) peri-prosthetic fracture; s/p L TK revision on 02/12/16. H/o B TKRs (2003).  Subjective Data  Subjective pt wants to go to rehab  Patient Stated Goal to get rehab before my next surgery  Precautions  Precautions Knee;Fall  Required Braces or Orthoses Knee Immobilizer - Left  Knee Immobilizer - Left On when out of bed or walking (pt states MD said off in bed ok)  Restrictions  Weight Bearing Restrictions No  LLE Weight Bearing WBAT  Pain Assessment  Pain Assessment Faces  Faces Pain Scale 2  Pain Location L knee  Pain Descriptors / Indicators Aching;Sore  Pain Intervention(s) Limited activity within patient's tolerance;Monitored during session;Repositioned;Ice applied  Cognition  Arousal/Alertness Awake/alert  Behavior During Therapy WFL for tasks assessed/performed;Flat affect  Overall Cognitive Status Within Functional Limits for tasks assessed  Bed Mobility  Overal bed mobility Needs Assistance  Bed Mobility Sit to Supine  Sit to supine Min assist  General bed mobility comments assist with LLE  Transfers  Overall transfer level Needs assistance  Equipment used Rolling walker (2 wheeled)  Transfers Sit to/from Stand  Sit to Stand Min assist  General transfer comment cues for LE management and UE placement/self assist  Ambulation/Gait  Ambulation/Gait assistance Min guard;Min assist  Ambulation Distance (Feet) 6 Feet  Assistive device Rolling walker (2 wheeled)  Gait Pattern/deviations Step-to pattern;Trunk flexed  General Gait Details cues for RW safety  Gait velocity decr  PT - End of Session  Equipment Utilized During Treatment Gait belt;Left knee immobilizer  Activity Tolerance Patient tolerated treatment well  Patient left in bed;with call bell/phone within reach;with bed alarm set;with family/visitor present   Nurse Communication Mobility status  PT - Assessment/Plan  PT Plan Current plan remains appropriate;Discharge plan needs to be updated  PT Frequency (ACUTE ONLY) 7X/week  Follow Up Recommendations SNF;Supervision for mobility/OOB  PT equipment None recommended by PT  PT Goal Progression  Progress towards PT goals Progressing toward goals  Acute Rehab PT Goals  PT Goal Formulation With patient  Potential to Achieve Goals Good  PT Time Calculation  PT Start Time (ACUTE ONLY) 1201  PT Stop Time (ACUTE ONLY) 1214  PT Time Calculation (min) (ACUTE ONLY) 13 min  PT General Charges  $$ ACUTE PT VISIT 1 Procedure  PT Treatments  $Therapeutic Activity 8-22 mins

## 2016-02-15 NOTE — NC FL2 (Signed)
[place] LEVEL OF CARE SCREENING TOOL     IDENTIFICATION  Patient Name: Susan Haney Birthdate: April 30, 1932 Sex: female Admission Date (Current Location): 02/11/2016  [facility] and [place] Number:  Herbalist and Address:  [facility],  [place] [address] [place]      Provider Number: [id number]  Attending Physician Name and Address:  Mariel Aloe, MD  Relative Name and Phone Number:       Current Level of Care: Hospital Recommended Level of Care: [place] Prior Approval Number:    Date Approved/Denied:   PASRR Number:    Discharge Plan: SNF    Current Diagnoses: Patient Active Problem List   Diagnosis Date Noted  . Closed fracture of left distal femur, initial encounter 02/13/2016  . GERD (gastroesophageal reflux disease) 06/30/2015  . Chronic lower back pain 06/18/2014  . Diabetes ([place]) 02/21/2014  . Essential hypertension, benign 02/21/2014  . Unspecified constipation 02/21/2014    Orientation RESPIRATION BLADDER Height & Weight     Self, Time, Situation, Place  Normal Continent Weight: 208 lb 12.4 oz (94.7 kg) Height:  5\' 3"  (160 cm)  BEHAVIORAL SYMPTOMS/MOOD NEUROLOGICAL BOWEL NUTRITION STATUS  Other (Comment) (n/a)  (n/a) Continent Diet (Low sodium heart healthy/carb modified)  AMBULATORY STATUS COMMUNICATION OF NEEDS Skin   Limited Assist Verbally Bruising, Surgical wounds                       Personal Care Assistance Level of Assistance  Bathing, Feeding, Dressing Bathing Assistance: Limited assistance Feeding assistance: Limited assistance Dressing Assistance: Limited assistance     Functional Limitations Info  Sight, Speech, Hearing Sight Info: Adequate Hearing Info: Adequate Speech Info: Adequate    SPECIAL CARE FACTORS FREQUENCY  OT (By licensed OT)  PT                    Contractures Contractures Info: Not present    Additional Factors Info  Insulin  Sliding Scale Code Status Info: Full code Allergies Info: No known allergies           Current Medications (02/15/2016):  This is the current hospital active medication list Current Facility-Administered Medications  Medication Dose Route Frequency Provider Last Rate Last Dose  . acetaminophen (TYLENOL) tablet 650 mg  650 mg Oral Q6H PRN Rod Can, MD   650 mg at 02/14/16 0934   Or  . acetaminophen (TYLENOL) suppository 650 mg  650 mg Rectal Q6H PRN Rod Can, MD      . enoxaparin (LOVENOX) injection 40 mg  40 mg Subcutaneous Daily Rod Can, MD   40 mg at 02/15/16 0913  . losartan (COZAAR) tablet 100 mg  100 mg Oral Daily Gennaro Africa, MD   100 mg at 02/15/16 0913   And  . hydrochlorothiazide (MICROZIDE) capsule 12.5 mg  12.5 mg Oral Daily Gennaro Africa, MD   12.5 mg at 02/15/16 0913  . HYDROcodone-acetaminophen (NORCO/VICODIN) 5-325 MG per tablet 1-2 tablet  1-2 tablet Oral Q6H PRN Rod Can, MD   1 tablet at 02/15/16 0801  . insulin aspart (novoLOG) injection 0-9 Units  0-9 Units Subcutaneous QID Gennaro Africa, MD   3 Units at 02/15/16 1049  . lactated ringers infusion   Intravenous Continuous Rod Can, MD 50 mL/hr at 02/13/16 1221    . magnesium citrate solution 1 Bottle  1 Bottle Oral Once PRN Rod Can, MD      . menthol-cetylpyridinium (  CEPACOL) lozenge 3 mg  1 lozenge Oral PRN Rod Can, MD       Or  . phenol (CHLORASEPTIC) mouth spray 1 spray  1 spray Mouth/Throat PRN Rod Can, MD      . methocarbamol (ROBAXIN) 500 mg in dextrose 5 % 50 mL IVPB  500 mg Intravenous Q6H PRN Bryson L Stilwell, PA-C   500 mg at 02/13/16 2216   Or  . methocarbamol (ROBAXIN) tablet 500 mg  500 mg Oral Q6H PRN Lajean Manes, PA-C   500 mg at 02/14/16 2155  . metoCLOPramide (REGLAN) tablet 5-10 mg  5-10 mg Oral Q8H PRN Rod Can, MD   10 mg at 02/15/16 [id number]   Or  . metoCLOPramide (REGLAN) injection 5-10 mg  5-10 mg Intravenous Q8H PRN Rod Can, MD       . morphine 2 MG/ML injection 0.5 mg  0.5 mg Intravenous Q2H PRN Rod Can, MD   0.5 mg at 02/13/16 2146  . ondansetron (ZOFRAN) tablet 4 mg  4 mg Oral Q6H PRN Rod Can, MD       Or  . ondansetron [facility]) injection 4 mg  4 mg Intravenous Q6H PRN Rod Can, MD      . pantoprazole (PROTONIX) EC tablet 40 mg  40 mg Oral Daily Gennaro Africa, MD   40 mg at 02/15/16 0913  . polyethylene glycol (MIRALAX / GLYCOLAX) packet 17 g  17 g Oral Daily PRN Rod Can, MD      . sorbitol 70 % solution 30 mL  30 mL Oral Daily PRN Rod Can, MD         Discharge Medications: Please see discharge summary for a list of discharge medications.  Relevant Imaging Results:  Relevant Lab Results:   Additional Information SSN: [id number]. Incisional vac in place.   Benay Pike [place], [place]

## 2016-02-15 NOTE — Progress Notes (Signed)
   Subjective: 3 Days Post-Op Procedure(s) (LRB): LEFT TOTAL KNEE REVISION (Left) Patient reports pain as mild.   Patient seen in rounds with Dr. Lyla Glassing.  She feels better today. Patient is well, but has had some minor complaints of pain in the knee, requiring pain medications Patient is ready to go home   Objective: Vital signs in last 24 hours: Temp:  [98.1 F (36.7 C)-101.3 F (38.5 C)] 100.2 F (37.9 C) (07/30 0548) Pulse Rate:  [76-95] 91 (07/30 0548) Resp:  [16-18] 18 (07/30 0548) BP: (116-157)/(41-61) 157/61 (07/30 0548) SpO2:  [94 %-99 %] 97 % (07/30 0548)  Intake/Output from previous day:  Intake/Output Summary (Last 24 hours) at 02/15/16 0804 Last data filed at 02/15/16 0549  Gross per 24 hour  Intake             2362 ml  Output             1850 ml  Net              512 ml    Intake/Output this shift: No intake/output data recorded.  Labs:  Recent Labs  02/12/16 1612 02/13/16 0435 02/14/16 0354 02/15/16 0418  HGB 8.6* 8.1* 7.0* 8.0*    Recent Labs  02/14/16 0354 02/15/16 0418  WBC 11.1* 12.8*  RBC 2.53* 2.92*  HCT 21.9* 24.9*  PLT 204 215    Recent Labs  02/14/16 0354 02/15/16 0418  NA 141 139  K 3.9 3.7  CL 110 106  CO2 26 27  BUN 19 20  CREATININE 1.09* 0.88  GLUCOSE 122* 113*  CALCIUM 8.4* 8.4*   No results for input(s): LABPT, INR in the last 72 hours.  EXAM: General - Patient is Alert, Appropriate and Oriented Extremity - Neurovascular intact Sensation intact distally Dorsiflexion/Plantar flexion intact Incision - clean, dry, no drainage, Incisional VAC in place. Motor Function - intact, moving foot and toes well on exam.   Assessment/Plan: 3 Days Post-Op Procedure(s) (LRB): LEFT TOTAL KNEE REVISION (Left) Procedure(s) (LRB): LEFT TOTAL KNEE REVISION (Left) Past Medical History:  Diagnosis Date  . Anemia    history ,  took iron for years  . Arthritis   . Chronic back pain   . Diabetes ([place])    x 15 yrs with good  control  . GERD (gastroesophageal reflux disease)   . Headache(784.0)   . Hypertension    Principal Problem:   Closed fracture of left distal femur, initial encounter Active Problems:   Diabetes ([place])   Essential hypertension, benign  Estimated body mass index is 36.98 kg/m as calculated from the following:   Height as of this encounter: 5\' 3"  (1.6 m).   Weight as of this encounter: 94.7 kg (208 lb 12.4 oz). Discharge home with home health Diet - Cardiac diet and Diabetic diet Follow up - with Dr. Lyla Glassing next WED or THURS in the office. Activity - WBAT Disposition - Home D/C Meds - See DC Summary DVT Prophylaxis - Lovenox  Arlee Muslim, PA-C Orthopaedic Surgery 02/15/2016, 8:04 AM

## 2016-02-15 NOTE — Clinical Social Work Note (Signed)
Clinical Social Work Assessment  Patient Details  Name: Susan Haney MRN: [id number] Date of Birth: Jan 26, 1932  Date of referral:  02/15/16               Reason for consult:  Facility Placement                Permission sought to share information with:  Family Supports Permission granted to share information::     Name::     children in room  Agency::     Relationship::     Contact Information:     Housing/Transportation Living arrangements for the past 2 months:  Single Family Home Source of Information:  Patient, Adult Children Patient Interpreter Needed:  [place], None Criminal Activity/Legal Involvement Pertinent to Current Situation/Hospitalization:  No - Comment as needed Significant Relationships:  Adult Children, Siblings Lives with:  Self Do you feel safe going back to the place where you live?  No Need for family participation in patient care:  No (Coment)  Care giving concerns:  Pt lives alone and feels she will need additional support.    Social Worker assessment / plan:  CSW met with pt and pt's son and daughter-in-law in room with pt permission. Pt alert and oriented and reports she lives alone. Her children live out of town. Pt is s/p left TK revision. She was planning to return home with assistance from her sister, but has not progressed as much as she was hoping before d/c. PT worked with pt this morning and now feel SNF is most appropriate. CSW discussed placement process. Pt lives in [place] and requests placement in [place] or [place]. Will initiate referral.   Employment status:  Retired Nurse, adult PT Recommendations:  [place] / Referral to community resources:  [place]  Patient/Family's Response to care:  Pt is agreeable to short term SNF prior to return home.   Patient/Family's Understanding of and Emotional Response to Diagnosis, Current Treatment, and Prognosis:  Pt is  aware of recommendation for SNF and appears to be relieved as she was concerned about ability to manage at home.   Emotional Assessment Appearance:  Appears stated age Attitude/Demeanor/Rapport:  Other (Cooperative) Affect (typically observed):  Appropriate Orientation:  Oriented to Self, Oriented to Place, Oriented to  Time, Oriented to Situation Alcohol / Substance use:  Not Applicable Psych involvement (Current and /or in the community):  No (Comment)  Discharge Needs  Concerns to be addressed:  Discharge Planning Concerns Readmission within the last 30 days:  No Current discharge risk:  Lives alone Barriers to Discharge:  No Barriers Identified   Salome Arnt, [place] 02/15/2016, 10:55 AM [phone]

## 2016-02-15 NOTE — Clinical Social Work Placement (Signed)
   CLINICAL SOCIAL WORK PLACEMENT  NOTE  Date:  02/15/2016  Patient Details  Name: Susan Haney MRN: [id number] Date of Birth: 1932-01-24  Clinical Social Work is seeking post-discharge placement for this patient at the [place] level of care (*CSW will initial, date and re-position this form in  chart as items are completed):  Yes   Patient/family provided with [place] Work Department's list of facilities offering this level of care within the geographic area requested by the patient (or if unable, by the patient's family).  Yes   Patient/family informed of their freedom to choose among providers that offer the needed level of care, that participate in Medicare, Medicaid or managed care program needed by the patient, have an available bed and are willing to accept the patient.  Yes   Patient/family informed of [facility]'s ownership interest in [facility] and [facility], as well as of the fact that they are under no obligation to receive care at these facilities.  PASRR submitted to EDS on       PASRR number received on       Existing PASRR number confirmed on       FL2 transmitted to all facilities in geographic area requested by pt/family on 02/15/16     FL2 transmitted to all facilities within larger geographic area on       Patient informed that his/her managed care company has contracts with or will negotiate with certain facilities, including the following:            Patient/family informed of bed offers received.  Patient chooses bed at       Physician recommends and patient chooses bed at      Patient to be transferred to   on  .  Patient to be transferred to facility by       Patient family notified on   of transfer.  Name of family member notified:        PHYSICIAN       Additional Comment:    _______________________________________________ Salome Arnt, LCSW 02/15/2016, 4:00  PM [phone]

## 2016-02-15 NOTE — Progress Notes (Signed)
Occupational Therapy Treatment Patient Details Name: Susan Haney MRN: [id number] DOB: 1931/12/07 Today's Date: 02/15/2016    History of present illness pt adm with Left distal femur (knee) peri-prosthetic fracture; s/p L TK revision on 02/12/16. H/o B TKRs (2003).   OT comments  Pt requesting to see SW to discuss possible SNF placement. Pt concerned after completing adls this am with return home with limited (A) from sister while she is in [place] visiting. Pt reports "maybe I need to do more rehab before my next surgery." pt fatigued with bed to bathroom transfer with seated sponge bath and transfer to recliner.    Follow Up Recommendations  SNF (per patients request)    Equipment Recommendations  None recommended by OT    Recommendations for Other Services      Precautions / Restrictions Precautions Precautions: Knee;Fall Precaution Comments: no knee ROM; multiple drains and incision vac in place Required Braces or Orthoses: Knee Immobilizer - Left Knee Immobilizer - Left: On at all times Restrictions LLE Weight Bearing: Weight bearing as tolerated       Mobility Bed Mobility Overal bed mobility: Needs Assistance Bed Mobility: Supine to Sit     Supine to sit: Mod assist     General bed mobility comments: pt requires (A) for L LE and use of R bed rail with hob elevated  Transfers Overall transfer level: Needs assistance Equipment used: Rolling walker (2 wheeled) Transfers: Sit to/from Stand Sit to Stand: Min assist         General transfer comment: elevated surface from bed and 3n1 due to patients height     Balance Overall balance assessment: Needs assistance Sitting-balance support: Bilateral upper extremity supported;Feet supported Sitting balance-Leahy Scale: Fair     Standing balance support: Bilateral upper extremity supported;During functional activity Standing balance-Leahy Scale: Poor                     ADL Overall ADL's : Needs  assistance/impaired Eating/Feeding: Independent   Grooming: Wash/dry face;Oral care;Set up;Sitting Grooming Details (indicate cue type and reason): requires seated positiong Upper Body Bathing: Supervision/ safety;Sitting   Lower Body Bathing: Moderate assistance;Sit to/from stand   Upper Body Dressing : Supervision/safety;Sitting   Lower Body Dressing: Maximal assistance;Bed level Lower Body Dressing Details (indicate cue type and reason): don KI Toilet Transfer: Min guard;Ambulation;BSC;RW Toilet Transfer Details (indicate cue type and reason): Pt required cues for safety with RW Toileting- Clothing Manipulation and Hygiene: Minimal assistance;Sit to/from stand Toileting - Clothing Manipulation Details (indicate cue type and reason): pt requires standing position and incr time to widen BOS to complete peri care     Functional mobility during ADLs: Minimal assistance;Rolling walker General ADL Comments: Pt considering the need for SNF for short period of time. pt reports Maybe i need to consider it because i really only have my sister and she is coming up to visit. My son in [place] is in poor health and my son in [place] works. pt feeling fatigued after adl and Hgb 8.0 this AM      Vision                     Perception     Praxis      Cognition   Behavior During Therapy: WFL for tasks assessed/performed Overall Cognitive Status: Within Functional Limits for tasks assessed  Extremity/Trunk Assessment               Exercises Total Joint Exercises Ankle Circles/Pumps: AROM;5 reps;Both;Seated   Shoulder Instructions       General Comments      Pertinent Vitals/ Pain       Pain Assessment: Faces Faces Pain Scale: Hurts even more Pain Location: knee Pain Descriptors / Indicators: Operative site guarding Pain Intervention(s): Monitored during session;Premedicated before session;Repositioned  Home Living                                           Prior Functioning/Environment              Frequency Min 2X/week     Progress Toward Goals  OT Goals(current goals can now be found in the care plan section)  Progress towards OT goals: Progressing toward goals  Acute Rehab OT Goals Patient Stated Goal: to get rehab before my next surgery OT Goal Formulation: With patient Time For Goal Achievement: 02/20/16 ADL Goals Pt Will Perform Lower Body Bathing: with supervision;with adaptive equipment;sit to/from stand Pt Will Perform Lower Body Dressing: with supervision;with adaptive equipment;sit to/from stand Pt Will Transfer to Toilet: with supervision;bedside commode;ambulating Pt Will Perform Toileting - Clothing Manipulation and hygiene: with supervision;sit to/from stand  Plan Discharge plan needs to be updated    Co-evaluation                 End of Session Equipment Utilized During Treatment: Gait belt;Rolling walker;Left knee immobilizer   Activity Tolerance Patient tolerated treatment well   Patient Left in chair;with call bell/phone within reach   Nurse Communication Mobility status;Precautions        Time: 0821-0859 OT Time Calculation (min): 38 min  Charges: OT General Charges $OT Visit: 1 Procedure OT Treatments $Self Care/Home Management : 38-52 mins  Peri Maris 02/15/2016, 10:00 AM   Jeri Modena   OTR/L Pager: [phone] Office: [phone] .

## 2016-02-15 NOTE — Progress Notes (Signed)
PROGRESS NOTE    Susan Haney  [id number] DOB: 11-09-1931 DOA: 02/11/2016 PCP: Octavio Graves, DO   Brief Narrative:  HPI by Dr. Dreama Saa: History of Present Illness:  Patient is a 82 yo female with history of left knee replacement 15 years ago and DMII came with cc of fall that she had today as she tripped at church. She had no dizziness, vertigo, chest pain or dyspnea. She fell forward on her left knee/thigh and complains of pain in her left lower thigh. Otherwise she has no complaints. She has no history of prior falls for the past couple of years.   Patient was found to have a displaced left femoral metaphysis fracture/comminuted periprosthetic left distal femur fracture.  Orthopedics was consulted and counseled patient on options and she decided to have a revision total knee arthroplasty to a distal femur replacement.  This was performed in the late night hours of 02/12/16.  Subjective: Since receiving 1 unit of blood, patient states that her lightheadedness has resolved. She was able to ambulate with physical therapy yesterday without any issues. She had a fever with maximum temperature of 101.23F last night. She reports no associated symptoms of diaphoresis but reports some nausea. She has had a productive cough since her surgery with no associated chest pain or dyspnea. She has had a urinary catheter up until yesterday with no dysuria or hematuria.  Assessment & Plan:   Principal Problem:   Closed fracture of left distal femur, initial encounter Active Problems:   Diabetes ([place])   Essential hypertension, benign   Left femoral metaphysis fracture: displaced -Ortho consulted; s/p total knee revision, left -Vicodin prn pain -Reports doing well -Needs orthopedics f/u in 1 week  Symptomatic anemia, likely secondary to blood loss -No evidence of active bleeding -Surgery as are the seen this and will administer 2 units of packed red blood cells -Recheck CBC 2 hours after  transfusion completes  Fever Likely post fever. Since patient has has a productive cough with weighted bibasilar crackles (although likely secondary to atelectasis), mild concern for possible pneumonia. Leukocytosis is stable -Chest x-ray -Urinalysis -Trend fever curve-  DM -Continue to hold PO meds -Continue SSI - No hypoglycemia  HTN -Good control -Continue Losartan/HCTZ   DVT prophylaxis:  Lovenox Code Status: Full Family Communication:  None present Disposition Plan: Home with home health physical therapy, likely tomorrow 02/15/2016   Consultants:   Orthopedics  Procedures:   Left total knee revision   Antimicrobials:   Ancef x1 pre-op     Objective: Vitals:   02/14/16 1455 02/14/16 2159 02/15/16 0146 02/15/16 0548  BP: (!) 135/48 (!) 144/47  (!) 157/61  Pulse: 79 95  91  Resp: 16 18  18   Temp: 98.9 F (37.2 C) (!) 101.3 F (38.5 C) 100.3 F (37.9 C) 100.2 F (37.9 C)  TempSrc: Oral Oral Oral Oral  SpO2: 94% 94%  97%  Weight:      Height:        Intake/Output Summary (Last 24 hours) at 02/15/16 0759 Last data filed at 02/15/16 0549  Gross per 24 hour  Intake             2362 ml  Output             1850 ml  Net              512 ml   Filed Weights   02/12/16 0125  Weight: 94.7 kg (208 lb 12.4 oz)  Examination:  General exam: Appears calm and comfortable  Respiratory system: Bibasilar crackles. Respiratory effort normal. Cardiovascular system: S1 & S2 heard, RRR. No JVD, murmurs, rubs, gallops or clicks. No pedal edema. Gastrointestinal system: Abdomen is nondistended, soft and nontender. No organomegaly or masses felt. Normal bowel sounds heard. Central nervous system: Alert and oriented. No focal neurological deficits. Extremities: L knee in immobilizer. Bilateral trace LE edema. Holman's sign negative. Tenderness along anterior and posterior aspect of left leg Skin: No rashes, lesions or ulcers Psychiatry: Judgement and insight  appear normal. Mood & affect appropriate.     Data Reviewed: I have personally reviewed following labs and imaging studies  CBC:  Recent Labs Lab 02/11/16 2206 02/12/16 0406 02/12/16 1612 02/13/16 0435 02/14/16 0354 02/15/16 0418  WBC 13.8* 10.8* 9.0 15.6* 11.1* 12.8*  NEUTROABS 11.3*  --  6.0  --  7.8*  --   HGB 10.0* 8.8* 8.6* 8.1* 7.0* 8.0*  HCT 31.4* 28.1* 27.1* 25.6* 21.9* 24.9*  MCV 86.5 86.5 86.9 86.5 86.6 85.3  PLT 283 279 266 234 [id number]   Basic Metabolic Panel:  Recent Labs Lab 02/11/16 2206 02/13/16 0435 02/14/16 0354 02/15/16 0418  NA 140 137 141 139  K 3.9 4.3 3.9 3.7  CL 109 106 110 106  CO2 24 26 26 27   GLUCOSE 123* 167* 122* 113*  BUN 23* 19 19 20   CREATININE 0.96 0.97 1.09* 0.88  CALCIUM 9.1 8.3* 8.4* 8.4*   GFR: Estimated Creatinine Clearance: 53.9 mL/min (by C-G formula based on SCr of 0.88 mg/dL). Liver Function Tests:  Recent Labs Lab 02/11/16 2206  AST 14*  ALT 12*  ALKPHOS 42  BILITOT 0.4  PROT 6.5  ALBUMIN 3.4*   CBG:  Recent Labs Lab 02/14/16 0730 02/14/16 1209 02/14/16 1755 02/14/16 2155 02/15/16 0741  GLUCAP 117* 184* 161* 131* 137*    Recent Results (from the past 240 hour(s))  Surgical pcr screen     Status: None   Collection Time: 02/12/16  1:35 AM  Result Value Ref Range Status   MRSA, PCR NEGATIVE NEGATIVE Final   Staphylococcus aureus NEGATIVE NEGATIVE Final    Comment:        The Xpert SA Assay (FDA approved for NASAL specimens in patients over 29 years of age), is one component of a comprehensive surveillance program.  Test performance has been validated by [facility] for patients greater than or equal to 34 year old. It is not intended to diagnose infection nor to guide or monitor treatment.          Radiology Studies: No results found.      Scheduled Meds: . enoxaparin (LOVENOX) injection  40 mg Subcutaneous Daily  . losartan  100 mg Oral Daily   And  . hydrochlorothiazide  12.5  mg Oral Daily  . insulin aspart  0-9 Units Subcutaneous QID  . pantoprazole  40 mg Oral Daily   Continuous Infusions: . lactated ringers 50 mL/hr at 02/13/16 1221     LOS: 3 days    Time spent: 25 minutes    Cordelia Poche, MD [facility]   If 7PM-7AM, please contact night-coverage [email or web address] Password [email or web address] 02/15/2016, 7:59 AM

## 2016-02-15 NOTE — Progress Notes (Signed)
Physical Therapy Treatment Patient Details Name: Susan Haney MRN: [id number] DOB: 21-Mar-1932 Today's Date: 02/15/2016    History of Present Illness pt adm with Left distal femur (knee) peri-prosthetic fracture; s/p L TK revision on 02/12/16. H/o B TKRs (2003).    PT Comments    Pt  States she does not feel comfortable going home; she reports she is exhausted with minimal activity, has limited home support therefore recommending  SNF  Follow Up Recommendations  SNF     Equipment Recommendations  None recommended by PT    Recommendations for Other Services       Precautions / Restrictions Precautions Precautions: Knee;Fall Precaution Comments: no knee ROM; incision vac in place Required Braces or Orthoses: Knee Immobilizer - Left Knee Immobilizer - Left: On at all times Restrictions Weight Bearing Restrictions: No LLE Weight Bearing: Weight bearing as tolerated    Mobility  Bed Mobility Overal bed mobility: Needs Assistance Bed Mobility: Supine to Sit     Supine to sit: Mod assist     General bed mobility comments: in chair  Transfers Overall transfer level: Needs assistance Equipment used: Rolling walker (2 wheeled) Transfers: Sit to/from Stand Sit to Stand: Min assist         General transfer comment: cues for LE management and UEplacement/self assist  Ambulation/Gait Ambulation/Gait assistance: Min assist;Min guard Ambulation Distance (Feet): 25 Feet Assistive device: Rolling walker (2 wheeled) Gait Pattern/deviations: Step-to pattern;Antalgic;Decreased weight shift to left Gait velocity: decr   General Gait Details: cues for sequence, posture; 2 standing rests d/t UE fatigue and incr WOB   Stairs            Wheelchair Mobility    Modified Rankin (Stroke Patients Only)       Balance Overall balance assessment: Needs assistance Sitting-balance support: Bilateral upper extremity supported;Feet supported Sitting balance-Leahy Scale:  Fair     Standing balance support: Bilateral upper extremity supported Standing balance-Leahy Scale: Poor                      Cognition Arousal/Alertness: Awake/alert Behavior During Therapy: WFL for tasks assessed/performed;Flat affect Overall Cognitive Status: Within Functional Limits for tasks assessed                      Exercises Total Joint Exercises Ankle Circles/Pumps: AROM;Both;10 reps    General Comments        Pertinent Vitals/Pain Pain Assessment: 0-10 Faces Pain Scale: Hurts a little bit Pain Location: L knee Pain Descriptors / Indicators: Sore;Operative site guarding Pain Intervention(s): Limited activity within patient's tolerance;Monitored during session;Premedicated before session;Ice applied    Home Living                      Prior Function            PT Goals (current goals can now be found in the care plan section) Acute Rehab PT Goals Patient Stated Goal: to get rehab before my next surgery PT Goal Formulation: With patient Potential to Achieve Goals: Good Progress towards PT goals: Progressing toward goals    Frequency  7X/week    PT Plan Current plan remains appropriate;Discharge plan needs to be updated    Co-evaluation             End of Session Equipment Utilized During Treatment: Gait belt;Left knee immobilizer Activity Tolerance: Patient tolerated treatment well Patient left: in chair;with call bell/phone within reach;with chair alarm set  Time: [id number] PT Time Calculation (min) (ACUTE ONLY): 20 min  Charges:  $Gait Training: 8-22 mins                    G Codes:      Ellenor Wisniewski 03-15-2016, 10:15 AM

## 2016-02-15 NOTE — Discharge Instructions (Signed)
Dr. Rod Can Total Joint Specialist [facility] [address], [place], [place] [address] [phone]  TOTAL KNEE REPLACEMENT POSTOPERATIVE DIRECTIONS    Knee Rehabilitation, Guidelines Following Surgery  Results after knee surgery are often greatly improved when you follow the exercise, range of motion and muscle strengthening exercises prescribed by your doctor. Safety measures are also important to protect the knee from further injury. Any time any of these exercises cause you to have increased pain or swelling in your knee joint, decrease the amount until you are comfortable again and slowly increase them. If you have problems or questions, call your caregiver or physical therapist for advice.   WEIGHT BEARING Weight bearing as tolerated with assist device (walker, cane, etc) as directed, use it as long as suggested by your surgeon or therapist, typically at least 4-6 weeks.  Knee immobilizer while out of bed  HOME CARE INSTRUCTIONS  Remove items at home which could result in a fall. This includes throw rugs or furniture in walking pathways.  Continue medications as instructed at time of discharge. You may have some home medications which will be placed on hold until you complete the course of blood thinner medication.  You may start showering once you are discharged home but do not submerge the incision under water. Just pat the incision dry and apply a dry gauze dressing on daily. Walk with walker as instructed.  You may resume a sexual relationship in one month or when given the OK by your doctor.   Use walker as long as suggested by your caregivers.  Avoid periods of inactivity such as sitting longer than an hour when not asleep. This helps prevent blood clots.  You may put full weight on your legs and walk as much as is comfortable.  You may return to work once you are cleared by your doctor.  Do not drive a car for 6 weeks or until released by  you surgeon.   Do not drive while taking narcotics.  Wear the elastic stockings for three weeks following surgery during the day but you may remove then at night. Make sure you keep all of your appointments after your operation with all of your doctors and caregivers. You should call the office at the above phone number and make an appointment for approximately two weeks after the date of your surgery. Do not remove your surgical dressing. The dressing is waterproof; you may take showers in 3 days, but do not take tub baths or submerge the dressing. Please pick up a stool softener and laxative for home use as long as you are requiring pain medications.  ICE to the affected knee every three hours for 30 minutes at a time and then as needed for pain and swelling.  Continue to use ice on the knee for pain and swelling from surgery. You may notice swelling that will progress down to the foot and ankle.  This is normal after surgery.  Elevate the leg when you are not up walking on it.   It is important for you to complete the blood thinner medication as prescribed by your doctor.  Continue to use the breathing machine which will help keep your temperature down.  It is common for your temperature to cycle up and down following surgery, especially at night when you are not up moving around and exerting yourself.  The breathing machine keeps your lungs expanded and your temperature down.  RANGE OF MOTION AND STRENGTHENING EXERCISES  Rehabilitation  of the knee is important following a knee injury or an operation. After just a few days of immobilization, the muscles of the thigh which control the knee become weakened and shrink (atrophy). Knee exercises are designed to build up the tone and strength of the thigh muscles and to improve knee motion. Often times heat used for twenty to thirty minutes before working out will loosen up your tissues and help with improving the range of motion but do not use heat for  the first two weeks following surgery. These exercises can be done on a training (exercise) mat, on the floor, on a table or on a bed. Use what ever works the best and is most comfortable for you Knee exercises include:  Leg Lifts - While your knee is still immobilized in a splint or cast, you can do straight leg raises. Lift the leg to 60 degrees, hold for 3 sec, and slowly lower the leg. Repeat 10-20 times 2-3 times daily. Perform this exercise against resistance later as your knee gets better.  Quad and Hamstring Sets - Tighten up the muscle on the front of the thigh (Quad) and hold for 5-10 sec. Repeat this 10-20 times hourly. Hamstring sets are done by pushing the foot backward against an object and holding for 5-10 sec. Repeat as with quad sets.  A rehabilitation program following serious knee injuries can speed recovery and prevent re-injury in the future due to weakened muscles. Contact your doctor or a physical therapist for more information on knee rehabilitation.   SKILLED REHAB INSTRUCTIONS: If the patient is transferred to a skilled rehab facility following release from the hospital, a list of the current medications will be sent to the facility for the patient to continue.  When discharged from the skilled rehab facility, please have the facility set up the patient's [place] prior to being released. Also, the skilled facility will be responsible for providing the patient with their medications at time of release from the facility to include their pain medication, the muscle relaxants, and their blood thinner medication. If the patient is still at the rehab facility at time of the two week follow up appointment, the skilled rehab facility will also need to assist the patient in arranging follow up appointment in our office and any transportation needs.  MAKE SURE YOU:  Understand these instructions.  Will watch your condition.  Will get help right away if you are not  doing well or get worse.    Pick up stool softner and laxative for home use following surgery while on pain medications. Do NOT remove your dressing. You may shower.  Do not take tub baths or submerge incision under water. May shower starting three days after surgery. Please use a clean towel to pat the incision dry following showers. Continue to use ice for pain and swelling after surgery. Do not use any lotions or creams on the incision until instructed by your surgeon.    Vacuum-Assisted Closure Therapy Vacuum-assisted closure (VAC) therapy uses a device that removes fluid and germs from wounds to help them heal. It is used on wounds that cannot be closed with stitches. They often heal slowly. Vacuum-assisted therapy helps the wound stay clean and healthy while the open wound slowly grows back together. Vacuum-assisted closure therapy uses a bandage (dressing) that is made of foam. It is put inside the wound. Then, a drape is placed over the wound. This drape sticks to your skin to keep air out,  and to protect the wound. A tube is hooked up to a small pump and is attached to the drape. The pump sucks out the fluid and germs. Vacuum-assisted closure therapy can also help reduce the bad smell that comes from the wound. HOW DOES IT WORK?  The vacuum pump pulls fluid through the foam dressing. The dressing may wrinkle during this process. The fluid goes into the tube and away from the wound. The fluid then goes into a container. The fluid in the container must be replaced if it is full or at least once a week, even if the container is not full. The pulling from the pump helps to close the wound and bring better circulation to the wound area. The foam dressing covers and protects the wound. It helps your wound heal faster.  HOW DOES IT FEEL?   You might feel a little pulling when the pump is on.  You might also feel a mild vibrating sensation.  You might feel some discomfort when the  dressing is taken off. CAN I MOVE AROUND WITH VACUUM-ASSISTED CLOSURE THERAPY? Yes, it has a backup battery which is used when the machine is not plugged in, as long as the battery is working, you can move freely. WHAT ARE SOME THINGS I MUST KNOW?  Do not turn off the pump yourself, unless instructed to do so by your healthcare provider, such as for bathing.  Do not take off the dressing yourself, unless instructed to do so by your caregiver.  You can wash or shower with the dressing. However, do not take the pump into the shower. Make sure the wound dressing is protected and covered with plastic. The wound area must stay dry.  Do not turn off the pump for more than 2 hours. If the pump is off for more than 2 hours, your nurse must change your dressing.  Check frequently that the machine is on, that the machine indicates the therapy is on, and that all clamps are open. THE ALARM IS SOUNDING! WHAT SHOULD I DO?   Stay calm.  Do not turn off the pump or do anything with the dressing.  Call your clinic or caregiver right away if the alarm goes off and you cannot fix the problem. Some reasons the alarm might go off include: ? The fluid collection container is full. ? The battery is low. ? The dressing has a leak.  Explain to your caregiver what is happening. Follow the instructions you receive. WHEN SHOULD I CALL FOR HELP?   You have severe pain.  You have difficulty breathing.  You have bleeding that will not stop.  Your wound smells bad.  You have redness, swelling, or fluid leaking from your wound.  Your alarm goes off and you do not know what to do.  You have a fever.  Your wound itches severely.  Your dressing changes are often painful or bleeding often occurs.  You have diarrhea.  You have a sore throat.  You have a rash around the dressing or anywhere else on your body.  You feel nauseous.  You feel dizzy or weak.  The [facility] machine has been off for more than 2  hours. HOW DO I GET READY TO GO HOME WITH A PUMP?  A trained caregiver will talk to you and answer your questions about your vacuum-assisted closure therapy before you go home. He or she will explain what to expect. A caregiver will come to your home to apply the pump and  care for your wound. The at-home caregiver will be available for questions and will come back for the scheduled dressing changes, usually every 48-72 hours (or more often for severely infected wounds). Your at-home caregiver will also come if you are having an unexpected problem. If you have questions or do not know what to do when you go home, talk to your healthcare provider.  This information is not intended to replace advice given to you by your health care provider. Make sure you discuss any questions you have with your health care provider.  Document Released: 06/17/2008 Document Revised: 03/07/2013 Document Reviewed: 06/18/2011 Elsevier Interactive Patient Education [employer].

## 2016-02-15 NOTE — Care Management Note (Signed)
Case Management Note  Patient Details  Name: Susan Haney MRN: [id number] Date of Birth: [birth year]  Subjective/Objective:   s/p L TK revision on 02/12/16                 Action/Plan: Discharge Planning: Chart reviewed. CSW following for SNF placement.    Expected Discharge Date:  02/16/2016              Expected Discharge Plan:  Skilled Nursing Facility  In-House Referral:  Clinical Social Work  Discharge planning Services  CM Consult  Post Acute Care Choice:  NA Choice offered to:  NA  DME Arranged:  N/A DME Agency:  NA  HH Arranged:  NA HH Agency:  NA  Status of Service:  Completed, signed off  If discussed at [employer] of Stay Meetings, dates discussed:    Additional Comments:  Erenest Rasher, RN 02/15/2016, 10:36 AM

## 2016-02-16 LAB — CBC
HCT: 23.9 % — ABNORMAL LOW (ref 36.0–46.0)
HCT: 24.4 % — ABNORMAL LOW (ref 36.0–46.0)
Hemoglobin: 7.7 g/dL — ABNORMAL LOW (ref 12.0–15.0)
Hemoglobin: 7.9 g/dL — ABNORMAL LOW (ref 12.0–15.0)
MCH: 27.6 pg (ref 26.0–34.0)
MCH: 28.1 pg (ref 26.0–34.0)
MCHC: 32.2 g/dL (ref 30.0–36.0)
MCHC: 32.4 g/dL (ref 30.0–36.0)
MCV: 85.7 fL (ref 78.0–100.0)
MCV: 86.8 fL (ref 78.0–100.0)
PLATELETS: 234 10*3/uL (ref 150–400)
Platelets: 229 10*3/uL (ref 150–400)
RBC: 2.79 MIL/uL — ABNORMAL LOW (ref 3.87–5.11)
RBC: 2.81 MIL/uL — ABNORMAL LOW (ref 3.87–5.11)
RDW: 15.2 % (ref 11.5–15.5)
RDW: 15.2 % (ref 11.5–15.5)
WBC: 12.4 10*3/uL — ABNORMAL HIGH (ref 4.0–10.5)
WBC: 11.7 10*3/uL — ABNORMAL HIGH (ref 4.0–10.5)

## 2016-02-16 LAB — GLUCOSE, CAPILLARY
GLUCOSE-CAPILLARY: 131 mg/dL — AB (ref 65–99)
Glucose-Capillary: 135 mg/dL — ABNORMAL HIGH (ref 65–99)

## 2016-02-16 LAB — LACTATE DEHYDROGENASE: LDH: 178 U/L (ref 98–192)

## 2016-02-16 MED ORDER — FERROUS SULFATE 325 (65 FE) MG PO TABS: 325.0000 mg | ORAL_TABLET | Freq: Two times a day (BID) | ORAL | 3 refills | Status: DC

## 2016-02-16 MED ORDER — POLYETHYLENE GLYCOL 3350 17 G PO PACK: 17.0000 g | PACK | Freq: Every day | ORAL | Status: DC | PRN

## 2016-02-16 MED ORDER — POLYETHYLENE GLYCOL 3350 17 G PO PACK: 17.0000 g | PACK | Freq: Every day | ORAL | 0 refills | Status: DC | PRN

## 2016-02-16 MED ORDER — DOCUSATE SODIUM 100 MG PO CAPS: 100.0000 mg | ORAL_CAPSULE | Freq: Two times a day (BID) | ORAL | Status: DC

## 2016-02-16 MED ORDER — FERROUS SULFATE 325 (65 FE) MG PO TABS
325.0000 mg | ORAL_TABLET | Freq: Two times a day (BID) | ORAL | Status: DC
  Administered 2016-02-16: 325 mg via ORAL
  Filled 2016-02-16: qty 1

## 2016-02-16 NOTE — Progress Notes (Addendum)
PROGRESS NOTE    Susan Haney  [id number] DOB: 01-23-1932 DOA: 02/11/2016 PCP: Octavio Graves, DO   Brief Narrative:  HPI by Dr. Dreama Saa: History of Present Illness:  Patient is a 82 yo female with history of left knee replacement 15 years ago and DMII came with cc of fall that she had today as she tripped at church. She had no dizziness, vertigo, chest pain or dyspnea. She fell forward on her left knee/thigh and complains of pain in her left lower thigh. Otherwise she has no complaints. She has no history of prior falls for the past couple of years.   Patient was found to have a displaced left femoral metaphysis fracture/comminuted periprosthetic left distal femur fracture.  Orthopedics was consulted and counseled patient on options and she decided to have a revision total knee arthroplasty to a distal femur replacement.  This was performed in the late night hours of 02/12/16.  Subjective: Patient was mildly febrile overnight. No concerns overnight. She reports no lightheadedness, but is feeling weak overall.  Assessment & Plan:   Principal Problem:   Closed fracture of left distal femur, initial encounter Active Problems:   Diabetes ([place])   Essential hypertension, benign   Left femoral metaphysis fracture: displaced -Ortho consulted; s/p total knee revision, left -Vicodin prn pain -Reports doing well -Needs orthopedics f/u in 1 week  Symptomatic anemia, likely secondary to blood loss No evidence of active bleeding. Symptoms appear to have resolved. Hemoglobin down to 7.7 today from 8 yesterday -FOBT -haptoglobin and LDH -CBC in AM  Arrhythmia Likely PVCs. No history of atrial fibrillation -EKG  Fever Likely post fever. Chest x-ray unremarkable and urinalysis also unremarkable. Leukocytosis stable -Trend fever curve  DM No hypoglycemia. Moderately controlled while in the hospital -Continue to hold PO meds -Continue SSI -No hypoglycemia  HTN -adequate  control -Continue Losartan/HCTZ   DVT prophylaxis:  Lovenox Code Status: Full Family Communication:  None present Disposition Plan: SNF. Will plan for tomorrow   Consultants:   Orthopedics  Procedures:   Left total knee revision   Antimicrobials:   Ancef x1 pre-op     Objective: Vitals:   02/15/16 1401 02/15/16 2100 02/16/16 0500 02/16/16 0646  BP: (!) 141/48 (!) 134/55 (!) 144/64   Pulse: 89 81 87   Resp: 18 20 18    Temp:  99.3 F (37.4 C) (!) 100.7 F (38.2 C) 99.1 F (37.3 C)  TempSrc: Oral Oral Oral Oral  SpO2: 98% 95% 94%   Weight:      Height:        Intake/Output Summary (Last 24 hours) at 02/16/16 0846 Last data filed at 02/16/16 0045  Gross per 24 hour  Intake              720 ml  Output             1795 ml  Net            -1075 ml   Filed Weights   02/12/16 0125  Weight: 94.7 kg (208 lb 12.4 oz)    Examination:  General exam: Appears calm and comfortable  Respiratory system: Bibasilar crackles. Respiratory effort normal. Cardiovascular system: S1 & S2 heard, Regular rate with irregular rhythm. 1/6 systolic murmur heard. No JVD,rubs, gallops or clicks. No pedal edema. Gastrointestinal system: Abdomen is nondistended, soft and nontender. No organomegaly or masses felt. Normal bowel sounds heard. Central nervous system: Alert and oriented. No focal neurological deficits. Extremities: L knee in immobilizer. Bilateral  trace LE edema. Holman's sign negative. Tenderness along anterior and posterior aspect of left leg with no associated erythema. Skin: No rashes, lesions or ulcers Psychiatry: Judgement and insight appear normal. Mood & affect appropriate.    Data Reviewed: I have personally reviewed following labs and imaging studies  CBC:  Recent Labs Lab 02/11/16 2206  02/12/16 1612 02/13/16 0435 02/14/16 0354 02/15/16 0418 02/16/16 0425  WBC 13.8*  < > 9.0 15.6* 11.1* 12.8* 12.4*  NEUTROABS 11.3*  --  6.0  --  7.8*  --   --   HGB  10.0*  < > 8.6* 8.1* 7.0* 8.0* 7.7*  HCT 31.4*  < > 27.1* 25.6* 21.9* 24.9* 23.9*  MCV 86.5  < > 86.9 86.5 86.6 85.3 85.7  PLT 283  < > 266 234 204 215 229  < > = values in this interval not displayed. Basic Metabolic Panel:  Recent Labs Lab 02/11/16 2206 02/13/16 0435 02/14/16 0354 02/15/16 0418  NA 140 137 141 139  K 3.9 4.3 3.9 3.7  CL 109 106 110 106  CO2 24 26 26 27   GLUCOSE 123* 167* 122* 113*  BUN 23* 19 19 20   CREATININE 0.96 0.97 1.09* 0.88  CALCIUM 9.1 8.3* 8.4* 8.4*   GFR: Estimated Creatinine Clearance: 53.9 mL/min (by C-G formula based on SCr of 0.88 mg/dL). Liver Function Tests:  Recent Labs Lab 02/11/16 2206  AST 14*  ALT 12*  ALKPHOS 42  BILITOT 0.4  PROT 6.5  ALBUMIN 3.4*   CBG:  Recent Labs Lab 02/15/16 1007 02/15/16 1419 02/15/16 1736 02/15/16 2157 02/16/16 0724  GLUCAP 242* 160* 119* 113* 135*    Recent Results (from the past 240 hour(s))  Surgical pcr screen     Status: None   Collection Time: 02/12/16  1:35 AM  Result Value Ref Range Status   MRSA, PCR NEGATIVE NEGATIVE Final   Staphylococcus aureus NEGATIVE NEGATIVE Final    Comment:        The Xpert SA Assay (FDA approved for NASAL specimens in patients over 7 years of age), is one component of a comprehensive surveillance program.  Test performance has been validated by [facility] for patients greater than or equal to 52 year old. It is not intended to diagnose infection nor to guide or monitor treatment.          Radiology Studies: Dg Chest Port 1 View  Result Date: 02/15/2016 CLINICAL DATA:  82 year old female with productive cough and fever for 1 day. EXAM: PORTABLE CHEST 1 VIEW COMPARISON:  None. FINDINGS: Upper limits normal heart size noted. There is no evidence of focal airspace disease, pulmonary edema, suspicious pulmonary nodule/mass, pleural effusion, or pneumothorax. No acute bony abnormalities are identified. IMPRESSION: Upper limits normal heart size  without evidence of acute cardiopulmonary disease. Electronically Signed   By: Margarette Canada M.D.   On: 02/15/2016 08:28       Scheduled Meds: . docusate sodium  100 mg Oral BID  . enoxaparin (LOVENOX) injection  40 mg Subcutaneous Daily  . ferrous sulfate  325 mg Oral BID WC  . losartan  100 mg Oral Daily   And  . hydrochlorothiazide  12.5 mg Oral Daily  . insulin aspart  0-9 Units Subcutaneous QID  . pantoprazole  40 mg Oral Daily   Continuous Infusions: . lactated ringers 50 mL/hr at 02/13/16 1221     LOS: 4 days    Time spent: 25 minutes    Cordelia Poche, MD [facility]   If 7PM-7AM, please contact night-coverage [email or web address] Password [facility] 02/16/2016, 8:46 AM

## 2016-02-16 NOTE — Discharge Summary (Signed)
Physician Discharge Summary  Susan Haney [id number] DOB: [birth year] DOA: 02/11/2016  PCP: Octavio Graves, DO  Admit date: 02/11/2016 Discharge date: 02/16/2016  Admitted From: Home Disposition:  SNF [facility])  Recommendations for Outpatient Follow-up:  1. Follow up with PCP in 1-2 weeks 2. Please obtain CBC in 2-3 days  Home Health: No Equipment/Devices: None  Discharge Condition: Stable CODE STATUS:Full Code Diet recommendation: Heart Healthy / Carb Modified  Brief/Interim Summary: Patient is a 82 yo female with history of left knee replacement 15 years ago and DMII came with cc of fall that she had today as she tripped at church. She had no dizziness, vertigo, chest pain or dyspnea. She fell forward on her left knee/thigh and complains of pain in her left lower thigh. Otherwise she has no complaints. She has no history of prior falls for the past couple of years.   Patient was found to have a displaced left femoral metaphysis fracture/comminuted periprosthetic left distal femur fracture.  Orthopedics was consulted and counseled patient on options and she decided to have a revision total knee arthroplasty to a distal femur replacement.  This was performed in the late night hours of 02/12/16. Patient was found to have a drop of hemoglobin to 7 and received 1 unit of pRBCs. She was febrile with a tmax of 101.2 and has been having mildly elevated temps. Which have resolved today. Chest x-ray, urinalysis and surgical site not consistent with infection. Hemoglobin stable on discharge.  Discharge Diagnoses:  Principal Problem:   Closed fracture of left distal femur, initial encounter Active Problems:   Diabetes [facility])   Essential hypertension, benign    Discharge Instructions  Discharge Instructions    Call MD / Call 911    Complete by:  As directed   If you experience chest pain or shortness of breath, CALL 911 and be transported to the hospital emergency room.  If you  develope a fever above 101 F, pus (white drainage) or increased drainage or redness at the wound, or calf pain, call your surgeon's office.   Change dressing    Complete by:  As directed   Change dressing daily with sterile 4 x 4 inch gauze dressing and apply TED hose. Do not submerge the incision under water.   Constipation Prevention    Complete by:  As directed   Drink plenty of fluids.  Prune juice may be helpful.  You may use a stool softener, such as Colace (over the counter) 100 mg twice a day.  Use MiraLax (over the counter) for constipation as needed.   Diet - low sodium heart healthy    Complete by:  As directed   Diet Carb Modified    Complete by:  As directed   Discharge instructions    Complete by:  As directed   Pick up stool softner and laxative for home use following surgery while on pain medications. Do not submerge incision under water. May shower starting three days after surgery. Continue to use ice for pain and swelling after surgery. Do not use any lotions or creams on the incision until instructed by your surgeon.   Postoperative Constipation Protocol  Constipation - defined medically as fewer than three stools per week and severe constipation as less than one stool per week.  One of the most common issues patients have following surgery is constipation.  Even if you have a regular bowel pattern at home, your normal regimen is likely to be disrupted due to multiple  reasons following surgery.  Combination of anesthesia, postoperative narcotics, change in appetite and fluid intake all can affect your bowels.  In order to avoid complications following surgery, here are some recommendations in order to help you during your recovery period.  Colace (docusate) - Pick up an over-the-counter form of Colace or another stool softener and take twice a day as long as you are requiring postoperative pain medications.  Take with a full glass of water daily.  If you experience loose  stools or diarrhea, hold the colace until you stool forms back up.  If your symptoms do not get better within 1 week or if they get worse, check with your doctor.  Dulcolax (bisacodyl) - Pick up over-the-counter and take as directed by the product packaging as needed to assist with the movement of your bowels.  Take with a full glass of water.  Use this product as needed if not relieved by Colace only.   MiraLax (polyethylene glycol) - Pick up over-the-counter to have on hand.  MiraLax is a solution that will increase the amount of water in your bowels to assist with bowel movements.  Take as directed and can mix with a glass of water, juice, soda, coffee, or tea.  Take if you go more than two days without a movement. Do not use MiraLax more than once per day. Call your doctor if you are still constipated or irregular after using this medication for 7 days in a row.  If you continue to have problems with postoperative constipation, please contact the office for further assistance and recommendations.  If you experience "the worst abdominal pain ever" or develop nausea or vomiting, please contact the office immediatly for further recommendations for treatment.   Take Lovenox for a total of four weeks, then discontinue the Lovenox injections and resume the daily baby 81 mg Aspirin at home.   Do not put a pillow under the knee. Place it under the heel.    Complete by:  As directed   Do not sit on low chairs, stoools or toilet seats, as it may be difficult to get up from low surfaces    Complete by:  As directed   Driving restrictions    Complete by:  As directed   No driving until released by the physician.   Increase activity slowly as tolerated    Complete by:  As directed   Lifting restrictions    Complete by:  As directed   No lifting until released by the physician.   Patient may shower    Complete by:  As directed   You may shower without a dressing once there is no drainage.  Do not wash over  the wound.  If drainage remains, do not shower until drainage stops.   TED hose    Complete by:  As directed   Use stockings (TED hose) for 3 weeks on both leg(s).  You may remove them at night for sleeping.   Weight bearing as tolerated    Complete by:  As directed   Laterality:  left   Extremity:  Lower       Medication List    STOP taking these medications   aspirin 81 MG tablet   naproxen 500 MG tablet Commonly known as:  NAPROSYN     TAKE these medications   ASPERCREME LIDOCAINE EX Apply topically daily as needed.   CENTRUM SILVER ADULT 50+ Tabs Take 1 tablet by mouth daily.   cholecalciferol 400 units  Tabs tablet Commonly known as:  VITAMIN D Take 2,000 Units by mouth.   docusate sodium 100 MG capsule Commonly known as:  COLACE Take 100 mg by mouth 2 (two) times daily.   enoxaparin 40 MG/0.4ML injection Commonly known as:  LOVENOX Inject 0.4 mLs (40 mg total) into the skin daily. Take Lovenox injections for four weeks, then discontinue the Lovenox and resume the baby 81 mg Aspirin at home.   ferrous sulfate 325 (65 FE) MG tablet Take 1 tablet (325 mg total) by mouth 2 (two) times daily with a meal.   HYDROcodone-acetaminophen 5-325 MG tablet Commonly known as:  NORCO/VICODIN Take 1-2 tablets by mouth every 6 (six) hours as needed for moderate pain.   losartan-hydrochlorothiazide 100-12.5 MG tablet Commonly known as:  HYZAAR Take 1 tablet by mouth daily.   metFORMIN 500 MG (MOD) 24 hr tablet Commonly known as:  GLUMETZA Take 500 mg by mouth every evening.   methocarbamol 500 MG tablet Commonly known as:  ROBAXIN Take 1 tablet (500 mg total) by mouth every 6 (six) hours as needed for muscle spasms.   OMEGA 3 500 PO Take 1 tablet by mouth.   omeprazole 20 MG capsule Commonly known as:  PRILOSEC TAKE 1 CAPSULE (20 MG TOTAL) BY MOUTH DAILY.   PHILLIPS COLON HEALTH PO Take 1 tablet by mouth.   polyethylene glycol packet Commonly known as:  MIRALAX /  GLYCOLAX Take 17 g by mouth daily as needed for moderate constipation.   sitaGLIPtin 100 MG tablet Commonly known as:  JANUVIA Take 100 mg by mouth daily.      Follow-up Information    Swinteck, Horald Pollen, MD. Schedule an appointment as soon as possible for a visit in 1 week(s).   Specialty:  Orthopedic Surgery Why:  For wound re-check.  Call office ASAP at [phone] to setup appointment on Wednesday 8/2 or Thursday 8/3 with Dr. Lyla Glassing for wound check and dressing change. Contact information: [address]. [address] [address] [phone]          No Known Allergies  Consultations:  [facility]   Procedures/Studies: Ct Femur Left Wo Contrast  Result Date: 02/12/2016 CLINICAL DATA:  Evaluate distal femur fracture seen on radiographs. EXAM: CT OF THE LEFT FEMUR WITHOUT CONTRAST TECHNIQUE: Multidetector CT imaging was performed according to the standard protocol. Multiplanar CT image reconstructions were also generated. COMPARISON:  Left knee radiographs - 02/10/2026 FINDINGS: Re- demonstrated comminuted fracture involving the distal metaphysis of the femur with foreshortening and angulation, apex anterior. There are several osseous fragment about the main fracture site with dominant fragment about the anterior medial aspect of main fracture site measuring approximately 4.9 x 0.9 cm (image 45, series 6). There is no definitive extension of the comminuted fracture to involve the femoral component of the patient's left total knee replacement. Additionally, the tibial component of the total knee replacement appears intact. Expected adjacent soft tissue swelling. Evaluation for knee joint effusion and/or lipohemarthrosis is degraded secondary to streak artifact from the patient's total knee prosthesis. No definite radiopaque foreign body. No additional fractures are identified. Normal appearance of the left hip. Left hip joint spaces are preserved. No evidence  avascular necrosis. Several phleboliths are seen within the left hemipelvis. Scattered minimal vascular calcifications within the left superficial femoral artery. IMPRESSION: 1. Re- demonstrated comminuted fracture of the distal metaphysis of the left femur with foreshortening and angulation but without definitive extension to involve the femoral component of the patient's left total  knee replacement. 2. No additional fractures identified. Electronically Signed   By: Sandi Mariscal M.D.   On: 02/12/2016 00:48  Dg Chest Port 1 View  Result Date: 02/15/2016 CLINICAL DATA:  82 year old female with productive cough and fever for 1 day. EXAM: PORTABLE CHEST 1 VIEW COMPARISON:  None. FINDINGS: Upper limits normal heart size noted. There is no evidence of focal airspace disease, pulmonary edema, suspicious pulmonary nodule/mass, pleural effusion, or pneumothorax. No acute bony abnormalities are identified. IMPRESSION: Upper limits normal heart size without evidence of acute cardiopulmonary disease. Electronically Signed   By: Margarette Canada M.D.   On: 02/15/2016 08:28  Dg Knee Complete 4 Views Left  Result Date: 02/11/2016 CLINICAL DATA:  Status post fall. EXAM: LEFT KNEE - COMPLETE 4+ VIEW COMPARISON:  None. FINDINGS: Left total knee arthroplasty. Comminuted fracture of the distal femoral metaphysis just above the arthroplasty with 2 cm of posterior displacement and 2.5 cm of lateral displacement. No other fracture or dislocation. No aggressive lytic or sclerotic osseous lesion. Soft tissue contusion along the anterolateral aspect of the left knee. IMPRESSION: 1. Comminuted and displaced fracture of the distal left femoral metaphysis just above the left total knee arthroplasty. Electronically Signed   By: Kathreen Devoid   On: 02/11/2016 20:09  Dg Knee Left Port  Result Date: 02/13/2016 CLINICAL DATA:  82 year old female status post revision of left total knee arthroplasty. EXAM: PORTABLE LEFT KNEE - 1-2 VIEW; PORTABLE  LEFT TIBIA AND FIBULA - 2 VIEW COMPARISON:  Radiograph dated 02/11/2016 FINDINGS: There has been interval surgical resection of the distal femur with placement of a total knee arthroplasty with long segment of the stents within the distal femur and proximal tibia. The arthroplasty appears in good alignment. There is no acute fracture. A JP drainage catheter is noted along the lateral aspect of the knee. Small amount of air within the anterior compartment of the knee, likely postsurgical. There is diffuse soft tissue edema. Multiple cutaneous surgical clips noted anterior aspect of the knee. IMPRESSION: Interval resection of the distal femur and placement of long stem total knee arthroplasty. The arthroplasty appears in good anatomic alignment. Electronically Signed   By: Anner Crete M.D.   On: 02/13/2016 00:54  Dg Tibia/fibula Left Port  Result Date: 02/13/2016 CLINICAL DATA:  82 year old female status post revision of left total knee arthroplasty. EXAM: PORTABLE LEFT KNEE - 1-2 VIEW; PORTABLE LEFT TIBIA AND FIBULA - 2 VIEW COMPARISON:  Radiograph dated 02/11/2016 FINDINGS: There has been interval surgical resection of the distal femur with placement of a total knee arthroplasty with long segment of the stents within the distal femur and proximal tibia. The arthroplasty appears in good alignment. There is no acute fracture. A JP drainage catheter is noted along the lateral aspect of the knee. Small amount of air within the anterior compartment of the knee, likely postsurgical. There is diffuse soft tissue edema. Multiple cutaneous surgical clips noted anterior aspect of the knee. IMPRESSION: Interval resection of the distal femur and placement of long stem total knee arthroplasty. The arthroplasty appears in good anatomic alignment. Electronically Signed   By: Anner Crete M.D.   On: 02/13/2016 00:54  Left Total Knee Revision (02/12/2016)   Subjective:   Discharge Exam: Vitals:   02/16/16 1011  02/16/16 1345  BP: (!) 123/54 (!) 133/47  Pulse: 86 86  Resp:  18  Temp: 98.7 F (37.1 C) 99 F (37.2 C)   Vitals:   02/16/16 0500 02/16/16 0646 02/16/16 1011 02/16/16  1345  BP: (!) 144/64  (!) 123/54 (!) 133/47  Pulse: 87  86 86  Resp: 18   18  Temp: (!) 100.7 F (38.2 C) 99.1 F (37.3 C) 98.7 F (37.1 C) 99 F (37.2 C)  TempSrc: Oral Oral  Oral  SpO2: 94%   95%  Weight:      Height:        General: Pt is alert, awake, not in acute distress Cardiovascular: RRR, S1/S2 +, 1/6 systemic murmur, no rubs, no gallops Respiratory: CTA bilaterally, no wheezing, no rhonchi Abdominal: Soft, NT, ND, bowel sounds + Extremities: no edema, no cyanosis    The results of significant diagnostics from this hospitalization (including imaging, microbiology, ancillary and laboratory) are listed below for reference.     Microbiology: Recent Results (from the past 240 hour(s))  Surgical pcr screen     Status: None   Collection Time: 02/12/16  1:35 AM  Result Value Ref Range Status   MRSA, PCR NEGATIVE NEGATIVE Final   Staphylococcus aureus NEGATIVE NEGATIVE Final    Comment:        The Xpert SA Assay (FDA approved for NASAL specimens in patients over 76 years of age), is one component of a comprehensive surveillance program.  Test performance has been validated by [facility] for patients greater than or equal to 11 year old. It is not intended to diagnose infection nor to guide or monitor treatment.      Labs: BNP (last 3 results) No results for input(s): BNP in the last 8760 hours. Basic Metabolic Panel:  Recent Labs Lab 02/11/16 2206 02/13/16 0435 02/14/16 0354 02/15/16 0418  NA 140 137 141 139  K 3.9 4.3 3.9 3.7  CL 109 106 110 106  CO2 24 26 26 27   GLUCOSE 123* 167* 122* 113*  BUN 23* 19 19 20   CREATININE 0.96 0.97 1.09* 0.88  CALCIUM 9.1 8.3* 8.4* 8.4*   Liver Function Tests:  Recent Labs Lab 02/11/16 2206  AST 14*  ALT 12*  ALKPHOS 42  BILITOT 0.4   PROT 6.5  ALBUMIN 3.4*   No results for input(s): LIPASE, AMYLASE in the last 168 hours. No results for input(s): AMMONIA in the last 168 hours. CBC:  Recent Labs Lab 02/11/16 2206  02/12/16 1612 02/13/16 0435 02/14/16 0354 02/15/16 0418 02/16/16 0425 02/16/16 1351  WBC 13.8*  < > 9.0 15.6* 11.1* 12.8* 12.4* 11.7*  NEUTROABS 11.3*  --  6.0  --  7.8*  --   --   --   HGB 10.0*  < > 8.6* 8.1* 7.0* 8.0* 7.7* 7.9*  HCT 31.4*  < > 27.1* 25.6* 21.9* 24.9* 23.9* 24.4*  MCV 86.5  < > 86.9 86.5 86.6 85.3 85.7 86.8  PLT 283  < > 266 234 204 215 229 234  < > = values in this interval not displayed. Cardiac Enzymes: No results for input(s): CKTOTAL, CKMB, CKMBINDEX, TROPONINI in the last 168 hours. BNP: Invalid input(s): POCBNP CBG:  Recent Labs Lab 02/15/16 1419 02/15/16 1736 02/15/16 2157 02/16/16 0724 02/16/16 1236  GLUCAP 160* 119* 113* 135* 131*   D-Dimer No results for input(s): DDIMER in the last 72 hours. Hgb A1c No results for input(s): HGBA1C in the last 72 hours. Lipid Profile No results for input(s): CHOL, HDL, LDLCALC, TRIG, CHOLHDL, LDLDIRECT in the last 72 hours. Thyroid function studies No results for input(s): TSH, T4TOTAL, T3FREE, THYROIDAB in the last 72 hours.  Invalid input(s): FREET3 Anemia work up No results  for input(s): VITAMINB12, FOLATE, FERRITIN, TIBC, IRON, RETICCTPCT in the last 72 hours. Urinalysis    Component Value Date/Time   COLORURINE YELLOW 02/15/2016 1858   APPEARANCEUR CLEAR 02/15/2016 1858   LABSPEC 1.015 02/15/2016 1858   PHURINE 7.0 02/15/2016 1858   GLUCOSEU NEGATIVE 02/15/2016 1858   HGBUR NEGATIVE 02/15/2016 1858   BILIRUBINUR NEGATIVE 02/15/2016 1858   KETONESUR NEGATIVE 02/15/2016 1858   PROTEINUR NEGATIVE 02/15/2016 1858   NITRITE NEGATIVE 02/15/2016 1858   LEUKOCYTESUR NEGATIVE 02/15/2016 1858   Sepsis Labs Invalid input(s): PROCALCITONIN,  WBC,  LACTICIDVEN Microbiology Recent Results (from the past 240  hour(s))  Surgical pcr screen     Status: None   Collection Time: 02/12/16  1:35 AM  Result Value Ref Range Status   MRSA, PCR NEGATIVE NEGATIVE Final   Staphylococcus aureus NEGATIVE NEGATIVE Final    Comment:        The Xpert SA Assay (FDA approved for NASAL specimens in patients over 12 years of age), is one component of a comprehensive surveillance program.  Test performance has been validated by [facility] for patients greater than or equal to 70 year old. It is not intended to diagnose infection nor to guide or monitor treatment.      Time coordinating discharge: Over 30 minutes  SIGNED:   Cordelia Poche, MD  [facility] 02/16/2016, 2:25 PM Pager   If 7PM-7AM, please contact night-coverage [email or web address] Password [email or web address]

## 2016-02-16 NOTE — Clinical Social Work Placement (Signed)
   CLINICAL SOCIAL WORK PLACEMENT  NOTE  Date:  02/16/2016  Patient Details  Name: AYAA CZARNIK MRN: [id number] Date of Birth: 05-26-1932  Clinical Social Work is seeking post-discharge placement for this patient at the [place] level of care (*CSW will initial, date and re-position this form in  chart as items are completed):  Yes   Patient/family provided with [facility] Work Department's list of facilities offering this level of care within the geographic area requested by the patient (or if unable, by the patient's family).  Yes   Patient/family informed of their freedom to choose among providers that offer the needed level of care, that participate in Medicare, Medicaid or managed care program needed by the patient, have an available bed and are willing to accept the patient.  Yes   Patient/family informed of [facility]'s ownership interest in [facility] and [facility], as well as of the fact that they are under no obligation to receive care at these facilities.  PASRR submitted to EDS on 02/16/16     PASRR number received on 02/16/16     Existing PASRR number confirmed on       FL2 transmitted to all facilities in geographic area requested by pt/family on 02/15/16     FL2 transmitted to all facilities within larger geographic area on 02/16/16     Patient informed that his/her managed care company has contracts with or will negotiate with certain facilities, including the following:        Yes   Patient/family informed of bed offers received.  Patient chooses bed at [facility]     Physician recommends and patient chooses bed at      Patient to be transferred to [facility] on 02/16/16.  Patient to be transferred to facility by [facility]     Patient family notified on 02/16/16 of transfer.  Name of family member notified:  SISTER     PHYSICIAN       Additional Comment: Pt / fami;ly are in agreement with d/c to  SNF today. [facility] transport is needed. Medical necessity form completed. Pt is aware out of pocket costs may be associated with [facility] transport. D/c Summary sent to SNF for review. Scripts included in d/c packet. # for report provided to [facility].   _______________________________________________ Luretha Rued, LCSW  [phone] 02/16/2016, 4:20 PM

## 2016-02-16 NOTE — Progress Notes (Signed)
Physical Therapy Treatment Patient Details Name: SHAREEFAH SCHUERGER MRN: [id number] DOB: March 30, 1932 Today's Date: 02/16/2016    History of Present Illness pt adm with Left distal femur (knee) peri-prosthetic fracture; s/p L TK revision on 02/12/16. H/o B TKRs (2003).    PT Comments    Noted that patient's HGB 7.7. BP supine =136/55, sitting 138/71, after ambulation=173/58. Patient complained of fatigue but not  Dizziness.  Follow Up Recommendations  SNF;Supervision for mobility/OOB     Equipment Recommendations       Recommendations for Other Services       Precautions / Restrictions Precautions Precautions: Knee;Fall Precaution Comments: no knee ROM; incision vac in place Required Braces or Orthoses: Knee Immobilizer - Left Knee Immobilizer - Left: On when out of bed or walking Restrictions LLE Weight Bearing: Weight bearing as tolerated    Mobility  Bed Mobility   Bed Mobility: Supine to Sit     Supine to sit: Min assist     General bed mobility comments: assist with LLE  Transfers   Equipment used: Rolling walker (2 wheeled) Transfers: Sit to/from Stand Sit to Stand: Min assist         General transfer comment: cues for LE management and UE placement/self assist  Ambulation/Gait Ambulation/Gait assistance: Min assist Ambulation Distance (Feet): 30 Feet Assistive device: Rolling walker (2 wheeled) Gait Pattern/deviations: Step-to pattern;Antalgic Gait velocity: decr   General Gait Details: cues for RW safety   Stairs            Wheelchair Mobility    Modified Rankin (Stroke Patients Only)       Balance                                    Cognition Arousal/Alertness: Awake/alert                          Exercises      General Comments        Pertinent Vitals/Pain Pain Location: L knee Pain Descriptors / Indicators: Sore Pain Intervention(s): Monitored during session;Premedicated before session;Ice  applied;Repositioned    Home Living Family/patient expects to be discharged to:: Private residence                    Prior Function            PT Goals (current goals can now be found in the care plan section) Progress towards PT goals: Progressing toward goals    Frequency  7X/week    PT Plan Current plan remains appropriate    Co-evaluation             End of Session Equipment Utilized During Treatment: Gait belt;Left knee immobilizer Activity Tolerance: Patient limited by fatigue Patient left: in chair;with call bell/phone within reach;with chair alarm set     Time: [id number] PT Time Calculation (min) (ACUTE ONLY): 23 min  Charges:  $Gait Training: 23-37 mins                    G Codes:      Claretha Cooper 02/16/2016, 12:49 PM Tresa Endo PT [phone]

## 2016-02-16 NOTE — Progress Notes (Signed)
CSW met with pt today to assist with d/c planning. PT has recommended SNF at d/c. Pt is in agreement with this plan and has chosen [facility] for placement. SNF is able to accept pt today if stable for d/c. CSW will assist with d/c planning to SNF.  Werner Lean LCSW [phone]

## 2016-02-16 NOTE — Care Management Important Message (Signed)
Important Message  Patient Details  Name: AUBREEANNA FUNAI MRN: [id number] Date of Birth: [birth year]   Medicare Important Message Given:  Yes    Camillo Flaming 02/16/2016, 11:15 AMImportant Message  Patient Details  Name: AZALIYAH PATLAN MRN: [id number] Date of Birth: [birth year]   Medicare Important Message Given:  Yes    Camillo Flaming 02/16/2016, 11:14 AM

## 2016-02-16 NOTE — Progress Notes (Signed)
   Subjective: 4 Days Post-Op Procedure(s) (LRB): LEFT TOTAL KNEE REVISION (Left) Patient reports pain as mild.   No c/o. Patient is well, but has had some minor complaints of pain in the knee, requiring pain medications Patient is ready to go home   Objective: Vital signs in last 24 hours: Temp:  [98.8 F (37.1 C)-100.7 F (38.2 C)] 99.1 F (37.3 C) (07/31 0646) Pulse Rate:  [81-89] 87 (07/31 0500) Resp:  [18-20] 18 (07/31 0500) BP: (134-144)/(48-64) 144/64 (07/31 0500) SpO2:  [94 %-98 %] 94 % (07/31 0500)  Intake/Output from previous day:  Intake/Output Summary (Last 24 hours) at 02/16/16 0956 Last data filed at 02/16/16 0915  Gross per 24 hour  Intake              960 ml  Output             1920 ml  Net             -960 ml    Intake/Output this shift: Total I/O In: 240 [P.O.:240] Out: 200 [Urine:200]  Labs:  Recent Labs  02/14/16 0354 02/15/16 0418 02/16/16 0425  HGB 7.0* 8.0* 7.7*    Recent Labs  02/15/16 0418 02/16/16 0425  WBC 12.8* 12.4*  RBC 2.92* 2.79*  HCT 24.9* 23.9*  PLT 215 229    Recent Labs  02/14/16 0354 02/15/16 0418  NA 141 139  K 3.9 3.7  CL 110 106  CO2 26 27  BUN 19 20  CREATININE 1.09* 0.88  GLUCOSE 122* 113*  CALCIUM 8.4* 8.4*   No results for input(s): LABPT, INR in the last 72 hours.  EXAM: General - Patient is Alert, Appropriate and Oriented Extremity - Neurovascular intact Sensation intact distally Dorsiflexion/Plantar flexion intact Incision - clean, dry, no drainage, Incisional VAC in place. Motor Function - intact, moving foot and toes well on exam.   Assessment/Plan: 4 Days Post-Op Procedure(s) (LRB): LEFT TOTAL KNEE REVISION (Left) Procedure(s) (LRB): LEFT TOTAL KNEE REVISION (Left) Past Medical History:  Diagnosis Date  . Anemia    history ,  took iron for years  . Arthritis   . Chronic back pain   . Diabetes ([place])    x 15 yrs with good control  . GERD (gastroesophageal reflux disease)   .  Headache(784.0)   . Hypertension    Principal Problem:   Closed fracture of left distal femur, initial encounter Active Problems:   Diabetes ([place])   Essential hypertension, benign  Estimated body mass index is 36.98 kg/m as calculated from the following:   Height as of this encounter: 5\' 3"  (1.6 m).   Weight as of this encounter: 94.7 kg (208 lb 12.4 oz).   Discharge home with home health Diet - Cardiac diet and Diabetic diet Follow up - with Dr. Lyla Glassing this WED or THURS in the office. Activity - WBAT Disposition - Home D/C Meds - See DC Summary DVT Prophylaxis - Lovenox for 30 days  Rod Can, MD Orthopaedic Surgery 02/16/2016, 9:56 AM

## 2016-02-17 ENCOUNTER — Inpatient Hospital Stay (HOSPITAL_COMMUNITY): Admission: RE | Admit: 2016-02-17 | Payer: Medicare Other | Source: Ambulatory Visit

## 2016-02-17 LAB — HAPTOGLOBIN: HAPTOGLOBIN: 304 mg/dL — AB (ref 34–200)

## 2016-02-19 ENCOUNTER — Ambulatory Visit: Admit: 2016-02-19 | Payer: Medicare Other | Admitting: Gynecologic Oncology

## 2016-02-19 SURGERY — HYSTERECTOMY, TOTAL, ROBOT-ASSISTED, LAPAROSCOPIC, WITH BILATERAL SALPINGO-OOPHORECTOMY
Anesthesia: General | Laterality: Bilateral

## 2016-02-26 ENCOUNTER — Telehealth: Payer: Self-pay | Admitting: Gynecologic Oncology

## 2016-02-26 NOTE — Telephone Encounter (Signed)
Returned call to patient.  Patient currently in a rehab facility for knee surgery and is unable to bend her knee and she is receiving lovenox injections.  Asking about when she should have her GYN surgery for her endo ca.  She is to follow up with her orthopedic surgeon on Monday.  Advised to talk with him to see his recommendations since she would need to be mobile, able to bend her knee for surgery positioning, and to be off blood thinner for her surgery.  She is to call the office on Monday with an update of what her orthopedic surgeon recommended.  Advised to call for any questions or concerns.

## 2016-03-08 ENCOUNTER — Telehealth: Payer: Self-pay | Admitting: Gynecologic Oncology

## 2016-03-08 NOTE — Telephone Encounter (Signed)
Spoke with the patient about her appointment with the orthopedist last week.  She states he removed the staples and she is able to walk using a brace.  Stating her orthopedist said she could proceed with her endometrial staging surgery "any time you feel like it."  She states her hemoglobin was up to 9.9 from 7 at discharge.  Asking about what steps she needs to take at this time.  Advised her situation would be discussed with Dr. Denman George and our office would call her back with recommendations.  Verbalizing understanding.  No needs voiced.

## 2016-03-09 ENCOUNTER — Telehealth: Payer: Self-pay | Admitting: Gynecologic Oncology

## 2016-03-09 DIAGNOSIS — C541 Malignant neoplasm of endometrium: Secondary | ICD-10-CM

## 2016-03-09 MED ORDER — MEGESTROL ACETATE 40 MG PO TABS: 40.0000 mg | ORAL_TABLET | Freq: Two times a day (BID) | ORAL | 1 refills | Status: DC

## 2016-03-09 NOTE — Telephone Encounter (Signed)
Called and spoke with the patient about Dr. Serita Grit recommendations.  Patient advised to begin taking Megace 40 mg BID to treat her endometrial cancer until she can have surgery.  Mrs. Tuong advised that she would need to be off lovenox and would need to be able to flex her knee to a 90 degree angle for surgery positioning.  Patient stating she is to follow up with her orthopedist on Sept 12 and is told she will be able to bend her knee to a 90 degree angle at that time.  Follow up made with Dr. Denman George on Sept 20 and OR time will be held for her on Sept 26 as long as she is cleared by ortho.  Patient advised to call for any needs or concerns.  Megace was sent to [facility].  Patient states she is leaving rehabilitation tomorrow.

## 2016-03-23 ENCOUNTER — Telehealth: Payer: Self-pay | Admitting: Gynecologic Oncology

## 2016-03-23 NOTE — Telephone Encounter (Signed)
Patient called and was asking about resuming baby aspirin.  She has started taking Megace and had finished up her lovenox injections on Friday.  Advised that I would reach out to Dr. Denman George about when she would like the patient to stop the aspirin or if she would like her to continue and I would let the patient know.  Advised to call for any needs in between that time.

## 2016-03-24 ENCOUNTER — Telehealth: Payer: Self-pay | Admitting: Gynecologic Oncology

## 2016-03-24 NOTE — Telephone Encounter (Signed)
Informed patient of Dr. Serita Grit recommendations for her to stay off of her baby aspirin at this time until after surgery.  Patient verbalizing understanding.  All questions answered.  Advised to call for any needs or concerns.  Follow up scheduled for Sept 20

## 2016-04-06 ENCOUNTER — Ambulatory Visit: Payer: Medicare Other | Attending: Orthopedic Surgery | Admitting: Physical Therapy

## 2016-04-06 DIAGNOSIS — R600 Localized edema: Secondary | ICD-10-CM | POA: Diagnosis present

## 2016-04-06 DIAGNOSIS — M25562 Pain in left knee: Secondary | ICD-10-CM | POA: Diagnosis present

## 2016-04-06 DIAGNOSIS — M25662 Stiffness of left knee, not elsewhere classified: Secondary | ICD-10-CM | POA: Diagnosis present

## 2016-04-06 NOTE — Therapy (Signed)
[facility] [address] [place], [place], [address] Phone: [phone]   Fax:  [phone]  Physical Therapy Evaluation  Patient Details  Name: Susan Haney MRN: [id number] Date of Birth: 24-Sep-1931 Referring Provider: Samson Frederic MD  Encounter Date: 04/06/2016      PT End of Session - 04/06/16 1213    Visit Number 1   Number of Visits 12   Date for PT Re-Evaluation 06/05/16   PT Start Time 0945   PT Stop Time 1037   PT Time Calculation (min) 52 min   Activity Tolerance Patient tolerated treatment well   Behavior During Therapy [facility] for tasks assessed/performed      Past Medical History:  Diagnosis Date  . Anemia    history ,  took iron for years  . Arthritis   . Chronic back pain   . Diabetes ([facility])    x 15 yrs with good control  . GERD (gastroesophageal reflux disease)   . Headache(784.0)   . Hypertension     Past Surgical History:  Procedure Laterality Date  . COLONOSCOPY N/A 04/04/2014   Procedure: COLONOSCOPY;  Surgeon: Malissa Hippo, MD;  Location: [facility];  Service: Endoscopy;  Laterality: N/A;  100  . FOOT SURGERY     Left  . Ovary removed: benign    . REPLACEMENT TOTAL KNEE BILATERAL     2013  . TOTAL KNEE REVISION Left 02/12/2016   Procedure: LEFT TOTAL KNEE REVISION;  Surgeon: Samson Frederic, MD;  Location: [facility] ORS;  Service: Orthopedics;  Laterality: Left;    There were no vitals filed for this visit.       Subjective Assessment - 04/06/16 0942    Subjective On July 26th, 2017 while church the patient fell and fractured her left femur.  She had a prior total knee replacement (2003).  The patient had to undergo surgery to have a total knee revision.  After surgery the patients hemoglobin dropped and she was unable to commence with therapy.  Patient also had to be immobilized in full extension for 3 weeks to allow for further healing.  The knee feels like it will give way and therefore she is  using her FWW.   Pertinent History Previous left total knee replacement.  H/o low back pain with radiculopathy with chronic low back pain.   Patient Stated Goals Get back to a normal activity level.   Currently in Pain? Yes   Pain Score 4    Pain Location Knee   Pain Orientation Left   Pain Descriptors / Indicators Aching   Pain Type Surgical pain   Pain Onset More than a month ago   Pain Frequency Constant   Aggravating Factors  Movement of left knee.   Pain Relieving Factors Pain pill.            [facility] PT Assessment - 04/06/16 0001      Assessment   Medical Diagnosis Periprosthetic fracture of left knee.   Referring Provider Samson Frederic MD   Onset Date/Surgical Date --  02/11/16.     Precautions   Precautions --  No ultrasound.     Restrictions   Weight Bearing Restrictions No     Balance Screen   Has the patient fallen in the past 6 months Yes   How many times? --  1   Has the patient had a decrease in activity level because of a fear of falling?  Yes   Is the patient  reluctant to leave their home because of a fear of falling?  Yes     Home Environment   Living Environment Private residence     Prior Function   Level of Independence Independent     Observation/Other Assessments   Observations Scabbed area in mid portion of left knee incisioanl site.     Observation/Other Assessments-Edema    Edema Circumferential     Circumferential Edema   Circumferential - Right LT 3 cms > RT.     ROM / Strength   AROM / PROM / Strength AROM;Strength     AROM   Overall AROM Comments Full active left knee extension and active flexion= 90 degrees and passive= 95 degrees.     Strength   Overall Strength Comments Left hip and knee strength= 4/5.     Palpation   Palpation comment Tender and taut to palpation over left tib ant and tender and sore to palpation over lateral left quadriceps muscle group.     Ambulation/Gait   Gait Comments Left knee hyperextension  during ambualtion.  Patient is using a FWW for safety as she feels her left knee gives way at times.                   [facility] Adult PT Treatment/Exercise - 04/06/16 0001      Modalities   Modalities Electrical Stimulation;Vasopneumatic     Electrical Stimulation   Electrical Stimulation Location Left knee.   Electrical Stimulation Action Pre-mod to left tib ant and left lateral quads x 20 minutes.     Vasopneumatic   Number Minutes Vasopneumatic  15 minutes   Vasopnuematic Location  --  Left knee.   Vasopneumatic Pressure Medium                  PT Short Term Goals - 04/06/16 1219      PT SHORT TERM GOAL #1   Title STG's=LTG's.           PT Long Term Goals - 04/06/16 1219      PT LONG TERM GOAL #1   Title Independent with a HEP.   Time 8   Period Weeks   Status New     PT LONG TERM GOAL #2   Title Active left knee flexion to 115 degrees+ so the patient can perform functional tasks and do so with pain not > 2-3/10.   Time 8   Period Weeks   Status New     PT LONG TERM GOAL #3   Title Increase left knee strength to a solid 4+/5 to provide good stability for accomplishment of functional activities   Time 8   Period Weeks   Status New     PT LONG TERM GOAL #4   Title Perform a reciprocating stair gait with one railing with pain not > 2-3/10.   Time 8   Period Weeks   Status New     PT LONG TERM GOAL #5   Title Walk in clinic with a straight cane.   Time 8   Period Weeks   Status New               Plan - 04/06/16 1217    Clinical Impression Statement The patient presents with left knee pain and loss of range of motion into flexion.  Her edema is 3 cms greater on left than right at mid-patellar region.  She is using a FWW as it feels like her knee will "give  way."  She hyperextends her left knee while walking which is is aware of.   Rehab Potential Excellent   PT Frequency 3x / week   PT Duration 4 weeks   PT  Treatment/Interventions ADLs/Self Care Home Management;Cryotherapy;Electrical Stimulation;Therapeutic activities;Therapeutic exercise;Patient/family education;Passive range of motion;Manual techniques;Vasopneumatic Device   PT Next Visit Plan Total knee protocol.  E'stim and vasopneumatic.  Please perform STW/M to left tib ant and lateral quadriceps.      Patient will benefit from skilled therapeutic intervention in order to improve the following deficits and impairments:  Pain, Decreased activity tolerance, Decreased range of motion, Increased edema  Visit Diagnosis: Pain in left knee - Plan: PT plan of care cert/re-cert  Stiffness of left knee, not elsewhere classified - Plan: PT plan of care cert/re-cert  Localized edema - Plan: PT plan of care cert/re-cert      G-Codes - [id number]    Functional Assessment Tool Used 64% limitation.   Functional Limitation Mobility: Walking and moving around   Mobility: Walking and Moving Around Current Status [phone]) At least 60 percent but less than 80 percent impaired, limited or restricted   Mobility: Walking and Moving Around Goal Status [phone]) At least 20 percent but less than 40 percent impaired, limited or restricted       Problem List Patient Active Problem List   Diagnosis Date Noted  . Closed fracture of left distal femur, initial encounter 02/13/2016  . GERD (gastroesophageal reflux disease) 06/30/2015  . Chronic lower back pain 06/18/2014  . Diabetes ([place]) 02/21/2014  . Essential hypertension, benign 02/21/2014  . Unspecified constipation 02/21/2014    Johntae Broxterman, [place]  MPT 04/06/2016, 12:29 PM  [facility] [address] [place], [place], [address] Phone: [phone]   Fax:  [phone]  Name: Susan Haney MRN: [id number] Date of Birth: 09/20/1931

## 2016-04-07 ENCOUNTER — Ambulatory Visit: Payer: Medicare Other | Attending: Gynecologic Oncology | Admitting: Gynecologic Oncology

## 2016-04-07 ENCOUNTER — Encounter: Payer: Self-pay | Admitting: Gynecologic Oncology

## 2016-04-07 VITALS — BP 155/69 | HR 78 | Temp 98.9°F | Resp 18 | Ht 63.0 in | Wt 198.0 lb

## 2016-04-07 DIAGNOSIS — Z7984 Long term (current) use of oral hypoglycemic drugs: Secondary | ICD-10-CM | POA: Diagnosis not present

## 2016-04-07 DIAGNOSIS — D649 Anemia, unspecified: Secondary | ICD-10-CM | POA: Insufficient documentation

## 2016-04-07 DIAGNOSIS — I10 Essential (primary) hypertension: Secondary | ICD-10-CM | POA: Diagnosis not present

## 2016-04-07 DIAGNOSIS — K219 Gastro-esophageal reflux disease without esophagitis: Secondary | ICD-10-CM | POA: Insufficient documentation

## 2016-04-07 DIAGNOSIS — E119 Type 2 diabetes mellitus without complications: Secondary | ICD-10-CM | POA: Insufficient documentation

## 2016-04-07 DIAGNOSIS — G8929 Other chronic pain: Secondary | ICD-10-CM | POA: Insufficient documentation

## 2016-04-07 DIAGNOSIS — M1990 Unspecified osteoarthritis, unspecified site: Secondary | ICD-10-CM | POA: Diagnosis not present

## 2016-04-07 DIAGNOSIS — M545 Low back pain: Secondary | ICD-10-CM | POA: Diagnosis not present

## 2016-04-07 DIAGNOSIS — C541 Malignant neoplasm of endometrium: Secondary | ICD-10-CM | POA: Diagnosis not present

## 2016-04-07 NOTE — Patient Instructions (Addendum)
Preparing for your Surgery  Plan for surgery on April 13, 2016 with Dr. Everitt Amber.  You will be scheduled for a robotic assisted total hysterectomy, bilateral salpingo-oophorectomy, sentinel lymph node biopsy.  Pre-operative Testing -You will receive a phone call from presurgical testing at [facility] to arrange for a pre-operative testing appointment before your surgery.  This appointment normally occurs one to two weeks before your scheduled surgery.   -Bring your insurance card, copy of an advanced directive if applicable, medication list  -At that visit, you will be asked to sign a consent for a possible blood transfusion in case a transfusion becomes necessary during surgery.  The need for a blood transfusion is rare but having consent is a necessary part of your care.     -You should not be taking blood thinners or aspirin at least ten days prior to surgery unless instructed by your surgeon.  Day Before Surgery at [place] will be asked to take in a light diet the day before surgery.  Avoid carbonated beverages.  You will be advised to have nothing to eat or drink after midnight the evening before.  Plan to drink one bottle of magnesium citrate the day before surgery starting around 12 noon.     Eat a light diet the day before surgery.  Examples including soups, broths, toast, yogurt, mashed potatoes.  Things to avoid include carbonated beverages (fizzy beverages), raw fruits and raw vegetables, or beans.    If your bowels are filled with gas, your surgeon will have difficulty visualizing your pelvic organs which increases your surgical risks.  Your role in recovery Your role is to become active as soon as directed by your doctor, while still giving yourself time to heal.  Rest when you feel tired. You will be asked to do the following in order to speed your recovery:  - Cough and breathe deeply. This helps toclear and expand your lungs and can prevent  pneumonia. You may be given a spirometer to practice deep breathing. A staff member will show you how to use the spirometer. - Do mild physical activity. Walking or moving your legs help your circulation and body functions return to normal. A staff member will help you when you try to walk and will provide you with simple exercises. Do not try to get up or walk alone the first time. - Actively manage your pain. Managing your pain lets you move in comfort. We will ask you to rate your pain on a scale of zero to 10. It is your responsibility to tell your doctor or nurse where and how much you hurt so your pain can be treated.  Special Considerations -If you are diabetic, you may be placed on insulin after surgery to have closer control over your blood sugars to promote healing and recovery.  This does not mean that you will be discharged on insulin.  If applicable, your oral antidiabetics will be resumed when you are tolerating a solid diet.  -Your final pathology results from surgery should be available by the Friday after surgery and the results will be relayed to you when available.   Blood Transfusion Information WHAT IS A BLOOD TRANSFUSION? A transfusion is the replacement of blood or some of its parts. Blood is made up of multiple cells which provide different functions.  Red blood cells carry oxygen and are used for blood loss replacement.  White blood cells fight against infection.  Platelets control bleeding.  Plasma helps clot  blood.  Other blood products are available for specialized needs, such as hemophilia or other clotting disorders. BEFORE THE TRANSFUSION  Who gives blood for transfusions?   You may be able to donate blood to be used at a later date on yourself (autologous donation).  Relatives can be asked to donate blood. This is generally not any safer than if you have received blood from a stranger. The same precautions are taken to ensure safety when a relative's blood  is donated.  Healthy volunteers who are fully evaluated to make sure their blood is safe. This is blood bank blood. Transfusion therapy is the safest it has ever been in the practice of medicine. Before blood is taken from a donor, a complete history is taken to make sure that person has no history of diseases nor engages in risky social behavior (examples are intravenous drug use or sexual activity with multiple partners). The donor's travel history is screened to minimize risk of transmitting infections, such as malaria. The donated blood is tested for signs of infectious diseases, such as HIV and hepatitis. The blood is then tested to be sure it is compatible with you in order to minimize the chance of a transfusion reaction. If you or a relative donates blood, this is often done in anticipation of surgery and is not appropriate for emergency situations. It takes many days to process the donated blood. RISKS AND COMPLICATIONS Although transfusion therapy is very safe and saves many lives, the main dangers of transfusion include:   Getting an infectious disease.  Developing a transfusion reaction. This is an allergic reaction to something in the blood you were given. Every precaution is taken to prevent this. The decision to have a blood transfusion has been considered carefully by your caregiver before blood is given. Blood is not given unless the benefits outweigh the risks.

## 2016-04-07 NOTE — Progress Notes (Signed)
Consult Note: Gyn-Onc   Susan Haney 82 y.o. female  Chief Complaint  Patient presents with  . endometrial cancer    follow up visit    Assessment :Endometrial adenocarcinoma (grade 1-2), s/p recent left LE fracture requiring TKR and delaying initial plan for surgery in August.   Plan:  She is now doing much better and can flex knee to 90 degrees. She has been taking megace while awaiting surgery for her cancer.   A detailed discussion was held with the patient and her family with regard to to her endometrial cancer diagnosis. We discussed the standard management options for uterine cancer which includes surgery followed possibly by adjuvant therapy depending on the results of surgery. The options for surgical management include a hysterectomy and removal of the tubes and ovaries possibly with removal of pelvic and para-aortic lymph nodes. A minimally invasive approach including a robotic hysterectomy or laparoscopic hysterectomy have benefits including shorter hospital stay, recovery time and better wound healing. The alternative approach is an open hysterectomy. The patient has been counseled about these surgical options and the risks of surgery in general including infection, bleeding, damage to surrounding structures (including bowel, bladder, ureters, nerves or vessels), and the postoperative risks of PE/ DVT, and lymphedema. I extensively reviewed the additional risks of robotic hysterectomy including possible need for conversion to open laparotomy.  I discussed positioning during surgery of trendelenberg and risks of minor facial swelling and care we take in preoperative positioning.  After counseling and consideration of her options, she desires to proceed with robotic hysterectomy, BSO, SLN biopsy.   We will position her while awake to protect her left knee.  She will be seen by anesthesia for preoperative clearance and discussion of postoperative pain management.  She was given the  opportunity to ask questions, which were answered to her satisfaction, and she is agreement with the above mentioned plan of care.  She has issues with chronic constipation and therefore we will recommend 1 bottle mag citrate preop.   HPI: The patient had onset of postmenopausal bleeding approximately a month ago. This was not associated with any pain or other symptoms. An endometrial biopsy was obtained showing a grade 1-2 endometrial adenocarcinoma. Pelvic ultrasound showed the uterus to measure 7 x 4.4 x 5.6 cm with 2 small fibroids. The left ovary surgically absent right ovary appeared normal there is no free fluid.  Today patient is having some vaginal bleeding. She also notes some increased edema and pain in her left lower extremity below the knee.  The patient has a past history of a benign left ovarian cyst which was removed laparoscopically. The patient underwent menopause at age 4. Pap smears are normal  Obstetrical history gravida 2 para 2  Comorbidities include diabetes valvular heart disease osteoporosis lumbar disc disease and obesity.  Interval Hx: Her original surgery date had to be postponed when she acutely fell and fractured her left distal femur assoicated with a knee replacement on 02/12/16. She required emergent repair and replacement and had been in rehab since that time. She is now ambulating with a walker.  Review of Systems:10 point review of systems is negative except as noted in interval history.   Vitals: Blood pressure (!) 155/69, pulse 78, temperature 98.9 F (37.2 C), temperature source Oral, resp. rate 18, height 5\' 3"  (1.6 m), weight 198 lb (89.8 kg), SpO2 99 %.  Physical Exam: General : The patient is a healthy woman in no acute distress.  HEENT: normocephalic, extraoccular  movements normal; neck is supple without thyromegally  Lynphnodes: Supraclavicular and inguinal nodes not enlarged  Abdomen: Soft, non-tender, no ascites, no organomegally, no masses,  no hernias  Pelvic:  EGBUS: Normal female (atrophic) Vagina: Normal, atrophic, no lesions  Urethra and Bladder: Normal, non-tender  Cervix: Normal no lesions are noted Uterus: Retroverted normal shape size and consistency  Bi-manual examination: Non-tender; no adenxal masses or nodularity  Rectal: normal sphincter tone, no masses, no blood  Lower extremities: incision healing normally on left leg/knee with no signs of infection. Can flex to 100 degrees.      No Known Allergies  Past Medical History:  Diagnosis Date  . Anemia    history ,  took iron for years  . Arthritis   . Chronic back pain   . Diabetes ([place])    x 15 yrs with good control  . GERD (gastroesophageal reflux disease)   . Headache(784.0)   . Hypertension     Past Surgical History:  Procedure Laterality Date  . COLONOSCOPY N/A 04/04/2014   Procedure: COLONOSCOPY;  Surgeon: Rogene Houston, MD;  [facility];  Service: Endoscopy;  Laterality: N/A;  100  . FOOT SURGERY     Left  . Ovary removed: benign    . REPLACEMENT TOTAL KNEE BILATERAL     2013  . TOTAL KNEE REVISION Left 02/12/2016   Procedure: LEFT TOTAL KNEE REVISION;  Surgeon: Rod Can, MD;  Location: [facility];  Service: Orthopedics;  Laterality: Left;    Current Outpatient Prescriptions  Medication Sig Dispense Refill  . cholecalciferol (VITAMIN D) 400 UNITS TABS tablet Take 2,000 Units by mouth.    . docusate sodium (COLACE) 100 MG capsule Take 100 mg by mouth 2 (two) times daily.    . ferrous sulfate 325 (65 FE) MG tablet Take 1 tablet (325 mg total) by mouth 2 (two) times daily with a meal.  3  . HYDROcodone-acetaminophen (NORCO/VICODIN) 5-325 MG tablet Take 1-2 tablets by mouth every 6 (six) hours as needed for moderate pain. 80 tablet 0  . losartan-hydrochlorothiazide (HYZAAR) 100-12.5 MG tablet Take 1 tablet by mouth daily.  3  . megestrol (MEGACE) 40 MG tablet Take 1 tablet (40 mg total) by mouth 2 (two) times daily. 60 tablet 1   . metFORMIN (GLUMETZA) 500 MG (MOD) 24 hr tablet Take 500 mg by mouth every evening.    . Multiple Vitamins-Minerals (CENTRUM SILVER ADULT 50+) TABS Take 1 tablet by mouth daily.     . naproxen (NAPROSYN) 500 MG tablet Take 500 mg by mouth 2 (two) times daily with a meal.    . Omega-3 Fatty Acids (OMEGA 3 500 PO) Take 4 tablets by mouth.     Marland Kitchen omeprazole (PRILOSEC) 20 MG capsule TAKE 1 CAPSULE (20 MG TOTAL) BY MOUTH DAILY. 90 capsule 3  . polyethylene glycol (MIRALAX / GLYCOLAX) packet Take 17 g by mouth daily as needed for moderate constipation.  0  . Probiotic Product (PHILLIPS COLON HEALTH PO) Take 1 tablet by mouth daily.     . sitaGLIPtin (JANUVIA) 100 MG tablet Take 100 mg by mouth daily.     No current facility-administered medications for this visit.     Social History   Social History  . Marital status: Widowed    Spouse name: N/A  . Number of children: N/A  . Years of education: N/A   Occupational History  . Not on file.   Social History Main Topics  . Smoking status: Never  Smoker  . Smokeless tobacco: Never Used  . Alcohol use No  . Drug use: No  . Sexual activity: Not Currently   Other Topics Concern  . Not on file   Social History Narrative  . No narrative on file    Family History  Problem Relation Age of Onset  . Diabetes Maternal Grandmother       Donaciano Eva, MD 04/07/2016, 2:29 PM

## 2016-04-08 ENCOUNTER — Ambulatory Visit: Payer: Medicare Other | Admitting: Physical Therapy

## 2016-04-08 ENCOUNTER — Encounter: Payer: Self-pay | Admitting: Physical Therapy

## 2016-04-08 DIAGNOSIS — M25562 Pain in left knee: Secondary | ICD-10-CM | POA: Diagnosis not present

## 2016-04-08 DIAGNOSIS — R600 Localized edema: Secondary | ICD-10-CM

## 2016-04-08 DIAGNOSIS — M25662 Stiffness of left knee, not elsewhere classified: Secondary | ICD-10-CM

## 2016-04-08 NOTE — Therapy (Signed)
[facility] [place], [place], [address] Phone: [phone]   Fax:  [phone]  Physical Therapy Treatment  Patient Details  Name: Susan Haney MRN: [id number] Date of Birth: [birth year] Referring Provider: Rod Can MD  Encounter Date: 04/08/2016      PT End of Session - 04/08/16 1515    Visit Number 2   Number of Visits 12   Date for PT Re-Evaluation 06/05/16   PT Start Time 1442   PT Stop Time 1530   PT Time Calculation (min) 48 min   Activity Tolerance Patient tolerated treatment well   Behavior During Therapy [facility] for tasks assessed/performed      Past Medical History:  Diagnosis Date  . Anemia    history ,  took iron for years  . Arthritis   . Chronic back pain   . Diabetes ([place])    x 15 yrs with good control  . GERD (gastroesophageal reflux disease)   . Headache(784.0)   . Hypertension     Past Surgical History:  Procedure Laterality Date  . COLONOSCOPY N/A 04/04/2014   Procedure: COLONOSCOPY;  Surgeon: Rogene Houston, MD;  [facility];  Service: Endoscopy;  Laterality: N/A;  100  . FOOT SURGERY     Left  . Ovary removed: benign    . REPLACEMENT TOTAL KNEE BILATERAL     2013  . TOTAL KNEE REVISION Left 02/12/2016   Procedure: LEFT TOTAL KNEE REVISION;  Surgeon: Rod Can, MD;  Location: [facility];  Service: Orthopedics;  Laterality: Left;    There were no vitals filed for this visit.      Subjective Assessment - 04/08/16 1450    Subjective Patient arrived with some pain in knee today, she feels like her knee is very weak    Pertinent History Previous left total knee replacement.  H/o low back pain with radiculopathy with chronic low back pain.   Patient Stated Goals Get back to a normal activity level.   Currently in Pain? Yes   Pain Score 5    Pain Location Knee   Pain Orientation Left   Pain Descriptors / Indicators Sore   Pain Type Surgical pain   Pain Onset More  than a month ago   Pain Frequency Constant   Aggravating Factors  bending knee   Pain Relieving Factors rest and meds                         [facility] Adult PT Treatment/Exercise - 04/08/16 0001      Exercises   Exercises Knee/Hip     Knee/Hip Exercises: Aerobic   Nustep L2 x59min UE/LE activity     Knee/Hip Exercises: Seated   Long Arc Quad Strengthening;Left;3 sets;10 reps   Long Arc Quad Weight 2 lbs.     Knee/Hip Exercises: Supine   Short Arc Quad Sets Strengthening;Left;3 sets;10 reps  2#   Hip Adduction Isometric Strengthening;Left;3 sets;10 reps  with ball   Straight Leg Raises Strengthening;Left;10 reps;AROM   Straight Leg Raise with External Rotation Left;10 reps;Strengthening;AROM     Electrical Stimulation   Electrical Stimulation Location left knee   Electrical Stimulation Action IFC   Electrical Stimulation Parameters 1-10hz  x73min   Electrical Stimulation Goals Pain     Vasopneumatic   Number Minutes Vasopneumatic  15 minutes   Vasopnuematic Location  Knee   Vasopneumatic Pressure Medium  PT Short Term Goals - 04/06/16 1219      PT SHORT TERM GOAL #1   Title STG's=LTG's.           PT Long Term Goals - 04/06/16 1219      PT LONG TERM GOAL #1   Title Independent with a HEP.   Time 8   Period Weeks   Status New     PT LONG TERM GOAL #2   Title Active left knee flexion to 115 degrees+ so the patient can perform functional tasks and do so with pain not > 2-3/10.   Time 8   Period Weeks   Status New     PT LONG TERM GOAL #3   Title Increase left knee strength to a solid 4+/5 to provide good stability for accomplishment of functional activities   Time 8   Period Weeks   Status New     PT LONG TERM GOAL #4   Title Perform a reciprocating stair gait with one railing with pain not > 2-3/10.   Time 8   Period Weeks   Status New     PT LONG TERM GOAL #5   Title Walk in clinic with a straight cane.    Time 8   Period Weeks   Status New               Plan - 04/08/16 1516    Clinical Impression Statement Patient progressing with activites fair due to pain in knee with certain movements and is limited with standing exercises due to hyper ext in left knee. Patient tolerated treatment well today. Patient current goals ongoing due to pain, strength and ROM deficts.   Rehab Potential Excellent   PT Frequency 3x / week   PT Duration 4 weeks   PT Treatment/Interventions ADLs/Self Care Home Management;Cryotherapy;Electrical Stimulation;Therapeutic activities;Therapeutic exercise;Patient/family education;Passive range of motion;Manual techniques;Vasopneumatic Device   PT Next Visit Plan cont with POC per MPT for Total knee protocol.  E'stim and vasopneumatic.  Please perform STW/M to left tib ant and lateral quadriceps. (heel slides, ROM and VMS to VMO/quad with quad sets)   Consulted and Agree with Plan of Care Patient      Patient will benefit from skilled therapeutic intervention in order to improve the following deficits and impairments:  Pain, Decreased activity tolerance, Decreased range of motion, Increased edema  Visit Diagnosis: Pain in left knee  Stiffness of left knee, not elsewhere classified  Localized edema     Problem List Patient Active Problem List   Diagnosis Date Noted  . Closed fracture of left distal femur, initial encounter 02/13/2016  . GERD (gastroesophageal reflux disease) 06/30/2015  . Chronic lower back pain 06/18/2014  . Diabetes ([place]) 02/21/2014  . Essential hypertension, benign 02/21/2014  . Unspecified constipation 02/21/2014    Phillips Climes, PTA 04/08/2016, 3:30 PM  [facility] [address] [place], [place], [address] Phone: [phone]   Fax:  [phone]  Name: Susan Haney MRN: [id number] Date of Birth: [birth year]

## 2016-04-09 ENCOUNTER — Encounter (HOSPITAL_COMMUNITY): Payer: Self-pay

## 2016-04-09 ENCOUNTER — Encounter (HOSPITAL_COMMUNITY): Payer: Self-pay | Admitting: *Deleted

## 2016-04-09 ENCOUNTER — Encounter (HOSPITAL_COMMUNITY)
Admission: RE | Admit: 2016-04-09 | Discharge: 2016-04-09 | Disposition: A | Discharge status: Home / Self Care | Payer: Medicare Other | Source: Ambulatory Visit | Attending: Gynecologic Oncology | Admitting: Gynecologic Oncology

## 2016-04-09 DIAGNOSIS — Z01818 Encounter for other preprocedural examination: Secondary | ICD-10-CM | POA: Diagnosis present

## 2016-04-09 DIAGNOSIS — E119 Type 2 diabetes mellitus without complications: Secondary | ICD-10-CM | POA: Diagnosis not present

## 2016-04-09 LAB — CBC WITH DIFFERENTIAL/PLATELET
BASOS ABS: 0 10*3/uL (ref 0.0–0.1)
BASOS PCT: 1 %
EOS PCT: 5 %
Eosinophils Absolute: 0.4 10*3/uL (ref 0.0–0.7)
HCT: 34 % — ABNORMAL LOW (ref 36.0–46.0)
Hemoglobin: 11 g/dL — ABNORMAL LOW (ref 12.0–15.0)
Lymphocytes Relative: 22 %
Lymphs Abs: 1.8 10*3/uL (ref 0.7–4.0)
MCH: 27.7 pg (ref 26.0–34.0)
MCHC: 32.4 g/dL (ref 30.0–36.0)
MCV: 85.6 fL (ref 78.0–100.0)
MONO ABS: 0.8 10*3/uL (ref 0.1–1.0)
Monocytes Relative: 9 %
Neutro Abs: 5.3 10*3/uL (ref 1.7–7.7)
Neutrophils Relative %: 63 %
PLATELETS: 338 10*3/uL (ref 150–400)
RBC: 3.97 MIL/uL (ref 3.87–5.11)
RDW: 16 % — AB (ref 11.5–15.5)
WBC: 8.3 10*3/uL (ref 4.0–10.5)

## 2016-04-09 LAB — COMPREHENSIVE METABOLIC PANEL
ALBUMIN: 4.1 g/dL (ref 3.5–5.0)
ALT: 9 U/L — ABNORMAL LOW (ref 14–54)
AST: 14 U/L — AB (ref 15–41)
Alkaline Phosphatase: 40 U/L (ref 38–126)
Anion gap: 8 (ref 5–15)
BUN: 26 mg/dL — AB (ref 6–20)
CHLORIDE: 111 mmol/L (ref 101–111)
CO2: 21 mmol/L — ABNORMAL LOW (ref 22–32)
Calcium: 10.1 mg/dL (ref 8.9–10.3)
Creatinine, Ser: 1.13 mg/dL — ABNORMAL HIGH (ref 0.44–1.00)
GFR calc Af Amer: 51 mL/min — ABNORMAL LOW (ref >60)
GFR, EST NON AFRICAN AMERICAN: 44 mL/min — AB (ref >60)
GLUCOSE: 90 mg/dL (ref 65–99)
POTASSIUM: 4.6 mmol/L (ref 3.5–5.1)
Sodium: 140 mmol/L (ref 135–145)
Total Bilirubin: 0.6 mg/dL (ref 0.3–1.2)
Total Protein: 7.3 g/dL (ref 6.5–8.1)

## 2016-04-09 LAB — URINALYSIS, ROUTINE W REFLEX MICROSCOPIC
Bilirubin Urine: NEGATIVE
Glucose, UA: NEGATIVE mg/dL
HGB URINE DIPSTICK: NEGATIVE
Ketones, ur: NEGATIVE mg/dL
LEUKOCYTES UA: NEGATIVE
Nitrite: NEGATIVE
Protein, ur: NEGATIVE mg/dL
SPECIFIC GRAVITY, URINE: 1.027 (ref 1.005–1.030)
pH: 5.5 (ref 5.0–8.0)

## 2016-04-09 NOTE — Pre-Procedure Instructions (Signed)
CXR 02-15-16 epic EKG 02-16-16 epic

## 2016-04-09 NOTE — Patient Instructions (Addendum)
Susan Haney  04/09/2016   Your procedure is scheduled on: 04/13/16  Report to [facility] Main  Entrance take [facility]  elevators to 3rd floor to  [place] at 5:30 AM.  Call this number if you have problems the morning of surgery [phone]   Remember: ONLY 1 PERSON MAY GO WITH YOU TO SHORT STAY TO GET  READY MORNING OF [place].  Do not eat food or drink liquids :After Midnight. Follow Dr. Serita Grit pre-surgical diet regimen.     Take these medicines the morning of surgery with A SIP OF WATER: Omeprazole (Prilosec), Hydrocodone-Acetaminophen if needed DO NOT TAKE ANY DIABETIC MEDICATIONS DAY OF YOUR SURGERY                               You may not have any metal on your body including hair pins and              piercings  Do not wear jewelry, make-up, lotions, powders or perfumes, deodorant             Do not wear nail polish.  Do not shave  48 hours prior to surgery.              Men may shave face and neck.   Do not bring valuables to the hospital. [place].  Contacts, dentures or bridgework may not be worn into surgery.  Leave suitcase in the car. After surgery it may be brought to your room.               Please read over the following fact sheets you were given: _____________________________________________________________________             [facility] - Preparing for Surgery Before surgery, you can play an important role.  Because skin is not sterile, your skin needs to be as free of germs as possible.  You can reduce the number of germs on your skin by washing with CHG (chlorahexidine gluconate) soap before surgery.  CHG is an antiseptic cleaner which kills germs and bonds with the skin to continue killing germs even after washing. Please DO NOT use if you have an allergy to CHG or antibacterial soaps.  If your skin becomes reddened/irritated stop using the CHG and inform your nurse  when you arrive at Short Stay. Do not shave (including legs and underarms) for at least 48 hours prior to the first CHG shower.  You may shave your face/neck. Please follow these instructions carefully:  1.  Shower with CHG Soap the night before surgery and the  morning of Surgery.  2.  If you choose to wash your hair, wash your hair first as usual with your  normal  shampoo.  3.  After you shampoo, rinse your hair and body thoroughly to remove the  shampoo.                           4.  Use CHG as you would any other liquid soap.  You can apply chg directly  to the skin and wash  Gently with a scrungie or clean washcloth.  5.  Apply the CHG Soap to your body ONLY FROM THE NECK DOWN.   Do not use on face/ open                           Wound or open sores. Avoid contact with eyes, ears mouth and genitals (private parts).                       Wash face,  Genitals (private parts) with your normal soap.             6.  Wash thoroughly, paying special attention to the area where your surgery  will be performed.  7.  Thoroughly rinse your body with warm water from the neck down.  8.  DO NOT shower/wash with your normal soap after using and rinsing off  the CHG Soap.                9.  Pat yourself dry with a clean towel.            10.  Wear clean pajamas.            11.  Place clean sheets on your bed the night of your first shower and do not  sleep with pets. Day of Surgery : Do not apply any lotions/deodorants the morning of surgery.  Please wear clean clothes to the hospital/surgery center.  FAILURE TO FOLLOW THESE INSTRUCTIONS MAY RESULT IN THE CANCELLATION OF YOUR SURGERY PATIENT SIGNATURE_________________________________  NURSE SIGNATURE__________________________________  ________________________________________________________________________   Susan Haney  An incentive spirometer is a tool that can help keep your lungs clear and active. This tool  measures how well you are filling your lungs with each breath. Taking long deep breaths may help reverse or decrease the chance of developing breathing (pulmonary) problems (especially infection) following:  A long period of time when you are unable to move or be active. BEFORE THE PROCEDURE   If the spirometer includes an indicator to show your best effort, your nurse or respiratory therapist will set it to a desired goal.  If possible, sit up straight or lean slightly forward. Try not to slouch.  Hold the incentive spirometer in an upright position. INSTRUCTIONS FOR USE  1. Sit on the edge of your bed if possible, or sit up as far as you can in bed or on a chair. 2. Hold the incentive spirometer in an upright position. 3. Breathe out normally. 4. Place the mouthpiece in your mouth and seal your lips tightly around it. 5. Breathe in slowly and as deeply as possible, raising the piston or the ball toward the top of the column. 6. Hold your breath for 3-5 seconds or for as long as possible. Allow the piston or ball to fall to the bottom of the column. 7. Remove the mouthpiece from your mouth and breathe out normally. 8. Rest for a few seconds and repeat Steps 1 through 7 at least 10 times every 1-2 hours when you are awake. Take your time and take a few normal breaths between deep breaths. 9. The spirometer may include an indicator to show your best effort. Use the indicator as a goal to work toward during each repetition. 10. After each set of 10 deep breaths, practice coughing to be sure your lungs are clear. If you have an incision (the cut made at the time of surgery),  support your incision when coughing by placing a pillow or rolled up towels firmly against it. Once you are able to get out of bed, walk around indoors and cough well. You may stop using the incentive spirometer when instructed by your caregiver.  RISKS AND COMPLICATIONS  Take your time so you do not get dizzy or  light-headed.  If you are in pain, you may need to take or ask for pain medication before doing incentive spirometry. It is harder to take a deep breath if you are having pain. AFTER USE  Rest and breathe slowly and easily.  It can be helpful to keep track of a log of your progress. Your caregiver can provide you with a simple table to help with this. If you are using the spirometer at home, follow these instructions: [place] IF:   You are having difficultly using the spirometer.  You have trouble using the spirometer as often as instructed.  Your pain medication is not giving enough relief while using the spirometer.  You develop fever of 100.5 F (38.1 C) or higher. SEEK IMMEDIATE MEDICAL CARE IF:   You cough up bloody sputum that had not been present before.  You develop fever of 102 F (38.9 C) or greater.  You develop worsening pain at or near the incision site. MAKE SURE YOU:   Understand these instructions.  Will watch your condition.  Will get help right away if you are not doing well or get worse. Document Released: 11/15/2006 Document Revised: 09/27/2011 Document Reviewed: 01/16/2007 ExitCare Patient Information 2014 ExitCare, [place].   ________________________________________________________________________  WHAT IS A BLOOD TRANSFUSION? Blood Transfusion Information  A transfusion is the replacement of blood or some of its parts. Blood is made up of multiple cells which provide different functions.  Red blood cells carry oxygen and are used for blood loss replacement.  White blood cells fight against infection.  Platelets control bleeding.  Plasma helps clot blood.  Other blood products are available for specialized needs, such as hemophilia or other clotting disorders. BEFORE THE TRANSFUSION  Who gives blood for transfusions?   Healthy volunteers who are fully evaluated to make sure their blood is safe. This is blood bank  blood. Transfusion therapy is the safest it has ever been in the practice of medicine. Before blood is taken from a donor, a complete history is taken to make sure that person has no history of diseases nor engages in risky social behavior (examples are intravenous drug use or sexual activity with multiple partners). The donor's travel history is screened to minimize risk of transmitting infections, such as malaria. The donated blood is tested for signs of infectious diseases, such as HIV and hepatitis. The blood is then tested to be sure it is compatible with you in order to minimize the chance of a transfusion reaction. If you or a relative donates blood, this is often done in anticipation of surgery and is not appropriate for emergency situations. It takes many days to process the donated blood. RISKS AND COMPLICATIONS Although transfusion therapy is very safe and saves many lives, the main dangers of transfusion include:   Getting an infectious disease.  Developing a transfusion reaction. This is an allergic reaction to something in the blood you were given. Every precaution is taken to prevent this. The decision to have a blood transfusion has been considered carefully by your caregiver before blood is given. Blood is not given unless the benefits outweigh the risks. AFTER THE TRANSFUSION  Right after receiving a blood transfusion, you will usually feel much better and more energetic. This is especially true if your red blood cells have gotten low (anemic). The transfusion raises the level of the red blood cells which carry oxygen, and this usually causes an energy increase.  The nurse administering the transfusion will monitor you carefully for complications. HOME CARE INSTRUCTIONS  No special instructions are needed after a transfusion. You may find your energy is better. Speak with your caregiver about any limitations on activity for underlying diseases you may have. SEEK MEDICAL CARE IF:    Your condition is not improving after your transfusion.  You develop redness or irritation at the intravenous (IV) site. SEEK IMMEDIATE MEDICAL CARE IF:  Any of the following symptoms occur over the next 12 hours:  Shaking chills.  You have a temperature by mouth above 102 F (38.9 C), not controlled by medicine.  Chest, back, or muscle pain.  People around you feel you are not acting correctly or are confused.  Shortness of breath or difficulty breathing.  Dizziness and fainting.  You get a rash or develop hives.  You have a decrease in urine output.  Your urine turns a dark color or changes to pink, red, or brown. Any of the following symptoms occur over the next 10 days:  You have a temperature by mouth above 102 F (38.9 C), not controlled by medicine.  Shortness of breath.  Weakness after normal activity.  The white part of the eye turns yellow (jaundice).  You have a decrease in the amount of urine or are urinating less often.  Your urine turns a dark color or changes to pink, red, or brown. Document Released: 07/02/2000 Document Revised: 09/27/2011 Document Reviewed: 02/19/2008 [facility] Patient Information 2014 [place], [place].  _______________________________________________________________________

## 2016-04-10 LAB — HEMOGLOBIN A1C
Hgb A1c MFr Bld: 5.2 % (ref 4.8–5.6)
Mean Plasma Glucose: 103 mg/dL

## 2016-04-12 ENCOUNTER — Encounter: Payer: Self-pay | Admitting: Physical Therapy

## 2016-04-12 ENCOUNTER — Ambulatory Visit: Payer: Medicare Other | Admitting: Physical Therapy

## 2016-04-12 DIAGNOSIS — M25562 Pain in left knee: Secondary | ICD-10-CM | POA: Diagnosis not present

## 2016-04-12 DIAGNOSIS — M25662 Stiffness of left knee, not elsewhere classified: Secondary | ICD-10-CM

## 2016-04-12 DIAGNOSIS — R600 Localized edema: Secondary | ICD-10-CM

## 2016-04-12 NOTE — Therapy (Signed)
[facility] [place], [place], [address] Phone: [phone]   Fax:  [phone]  Physical Therapy Treatment  Patient Details  Name: Susan Haney MRN: [id number] Date of Birth: [birth year] Referring Provider: Rod Can MD  Encounter Date: 04/12/2016      PT End of Session - 04/12/16 0933    Visit Number 3   Number of Visits 12   Date for PT Re-Evaluation 06/05/16   PT Start Time 0906   PT Stop Time 1001   PT Time Calculation (min) 55 min   Activity Tolerance Patient tolerated treatment well   Behavior During Therapy [facility] for tasks assessed/performed      Past Medical History:  Diagnosis Date  . Anemia    history ,  took iron for years  . Arthritis   . Bicuspid aortic valve   . Cancer [facility])    endometrial cancer  . Chronic back pain   . Diabetes ([place])    x 15 yrs with good control  . GERD (gastroesophageal reflux disease)   . Hypertension     Past Surgical History:  Procedure Laterality Date  . BUNIONECTOMY    . COLONOSCOPY N/A 04/04/2014   Procedure: COLONOSCOPY;  Surgeon: Rogene Houston, MD;  Location: [facility];  Service: Endoscopy;  Laterality: N/A;  100  . EYE SURGERY     bil cataracts and lens implants  . FOOT SURGERY     Left  . Ovary removed: benign    . REPLACEMENT TOTAL KNEE BILATERAL     2013  . TOTAL KNEE REVISION Left 02/12/2016   Procedure: LEFT TOTAL KNEE REVISION;  Surgeon: Rod Can, MD;  Location: [facility] ORS;  Service: Orthopedics;  Laterality: Left;    There were no vitals filed for this visit.      Subjective Assessment - 04/12/16 0907    Subjective Patient reported feeling some better than last week, yet continues to have weakness in knee and feels fatigue   Pertinent History Previous left total knee replacement.  H/o low back pain with radiculopathy with chronic low back pain.   Patient Stated Goals Get back to a normal activity level.   Currently in Pain? Yes    Pain Score 4    Pain Location Knee   Pain Orientation Left   Pain Descriptors / Indicators Sore   Pain Type Surgical pain   Pain Onset More than a month ago   Pain Frequency Constant   Aggravating Factors  bending knee   Pain Relieving Factors rest and meds            [facility] PT Assessment - 04/12/16 0001      ROM / Strength   AROM / PROM / Strength AROM;PROM     AROM   AROM Assessment Site Knee   Right/Left Knee Left   Left Knee Flexion 100     PROM   PROM Assessment Site Knee   Right/Left Knee Left   Left Knee Flexion 106                     [facility] Adult PT Treatment/Exercise - 04/12/16 0001      Knee/Hip Exercises: Aerobic   Nustep L3 x48min UE/LE activity     Electrical Stimulation   Electrical Stimulation Location left VMO/quad   Electrical Stimulation Action VMs   Electrical Stimulation Parameters 10/10 x25min with QS for activation   Electrical Stimulation Goals Neuromuscular facilitation;Strength  Vasopneumatic   Number Minutes Vasopneumatic  15 minutes   Vasopnuematic Location  Knee   Vasopneumatic Pressure Medium     Manual Therapy   Manual Therapy Passive ROM   Passive ROM gentle PROM for left knee flexion with low holds                   PT Short Term Goals - 04/06/16 1219      PT SHORT TERM GOAL #1   Title STG's=LTG's.           PT Long Term Goals - 04/12/16 0941      PT LONG TERM GOAL #1   Title Independent with a HEP.   Time 8   Period Weeks   Status On-going     PT LONG TERM GOAL #2   Title Active left knee flexion to 115 degrees+ so the patient can perform functional tasks and do so with pain not > 2-3/10.   Time 8   Period Weeks   Status On-going     PT LONG TERM GOAL #3   Title Increase left knee strength to a solid 4+/5 to provide good stability for accomplishment of functional activities   Time 8   Period Weeks   Status On-going     PT LONG TERM GOAL #4   Title Perform a reciprocating stair  gait with one railing with pain not > 2-3/10.   Time 8   Period Weeks   Status On-going     PT LONG TERM GOAL #5   Title Walk in clinic with a straight cane.   Time 8   Period Weeks   Status On-going               Plan - 04/12/16 0935    Clinical Impression Statement Patient progressing with all activities today. Patient tolerated treatment well and reported less pain overall. Patient had some fatigue after 10 min of activity on nustep and then VMS, patient prefers less time inbetween exercises due to fatigue. Performed manual stretching inbetween exercise today to allow patient to recover from fatigue. Patient gols progressing yet ongoing due to pain, strength and ROM deficit.   Rehab Potential Excellent   PT Frequency 3x / week   PT Duration 4 weeks   PT Treatment/Interventions ADLs/Self Care Home Management;Cryotherapy;Electrical Stimulation;Therapeutic activities;Therapeutic exercise;Patient/family education;Passive range of motion;Manual techniques;Vasopneumatic Device   PT Next Visit Plan cont with POC per MPT for Total knee protocol.  E'stim and vasopneumatic.  Please perform STW/M to left tib ant and lateral quadriceps. (heel slides, ROM and VMS to VMO/quad with quad sets)   Consulted and Agree with Plan of Care Patient      Patient will benefit from skilled therapeutic intervention in order to improve the following deficits and impairments:  Pain, Decreased activity tolerance, Decreased range of motion, Increased edema  Visit Diagnosis: Pain in left knee  Stiffness of left knee, not elsewhere classified  Localized edema     Problem List Patient Active Problem List   Diagnosis Date Noted  . Closed fracture of left distal femur, initial encounter 02/13/2016  . GERD (gastroesophageal reflux disease) 06/30/2015  . Chronic lower back pain 06/18/2014  . Diabetes ([place]) 02/21/2014  . Essential hypertension, benign 02/21/2014  . Unspecified constipation 02/21/2014     Ladean Raya P 04/12/2016, 10:07 AM  [facility] [place], [place], [address] Phone: [phone]   Fax:  [phone]  Name: LASHAWNDRA JANKIEWICZ MRN:  [id number] Date of Birth: 08-27-1931

## 2016-04-13 ENCOUNTER — Ambulatory Visit (HOSPITAL_COMMUNITY): Payer: Medicare Other | Admitting: Anesthesiology

## 2016-04-13 ENCOUNTER — Encounter (HOSPITAL_COMMUNITY): Payer: Self-pay | Admitting: *Deleted

## 2016-04-13 ENCOUNTER — Encounter (HOSPITAL_COMMUNITY)
Admission: AD | Disposition: A | Discharge status: Home / Self Care | Payer: Self-pay | Source: Ambulatory Visit | Attending: Gynecologic Oncology

## 2016-04-13 ENCOUNTER — Observation Stay (HOSPITAL_COMMUNITY)
Admission: AD | Admit: 2016-04-13 | Discharge: 2016-04-14 | Disposition: A | Discharge status: Home / Self Care | Payer: Medicare Other | Source: Ambulatory Visit | Attending: Gynecologic Oncology | Admitting: Gynecologic Oncology

## 2016-04-13 DIAGNOSIS — Z6835 Body mass index (BMI) 35.0-35.9, adult: Secondary | ICD-10-CM | POA: Insufficient documentation

## 2016-04-13 DIAGNOSIS — M549 Dorsalgia, unspecified: Secondary | ICD-10-CM | POA: Insufficient documentation

## 2016-04-13 DIAGNOSIS — E669 Obesity, unspecified: Secondary | ICD-10-CM | POA: Diagnosis not present

## 2016-04-13 DIAGNOSIS — Z7984 Long term (current) use of oral hypoglycemic drugs: Secondary | ICD-10-CM | POA: Insufficient documentation

## 2016-04-13 DIAGNOSIS — E119 Type 2 diabetes mellitus without complications: Secondary | ICD-10-CM | POA: Insufficient documentation

## 2016-04-13 DIAGNOSIS — M1990 Unspecified osteoarthritis, unspecified site: Secondary | ICD-10-CM | POA: Insufficient documentation

## 2016-04-13 DIAGNOSIS — Z01818 Encounter for other preprocedural examination: Secondary | ICD-10-CM | POA: Insufficient documentation

## 2016-04-13 DIAGNOSIS — Z96653 Presence of artificial knee joint, bilateral: Secondary | ICD-10-CM | POA: Diagnosis not present

## 2016-04-13 DIAGNOSIS — I10 Essential (primary) hypertension: Secondary | ICD-10-CM | POA: Diagnosis not present

## 2016-04-13 DIAGNOSIS — C541 Malignant neoplasm of endometrium: Secondary | ICD-10-CM | POA: Diagnosis not present

## 2016-04-13 DIAGNOSIS — G8929 Other chronic pain: Secondary | ICD-10-CM | POA: Diagnosis not present

## 2016-04-13 DIAGNOSIS — K219 Gastro-esophageal reflux disease without esophagitis: Secondary | ICD-10-CM | POA: Insufficient documentation

## 2016-04-13 HISTORY — DX: Malignant (primary) neoplasm, unspecified: C80.1

## 2016-04-13 HISTORY — PX: ROBOTIC ASSISTED TOTAL HYSTERECTOMY WITH BILATERAL SALPINGO OOPHERECTOMY: SHX6086

## 2016-04-13 HISTORY — DX: Bicuspid aortic valve: Q23.81

## 2016-04-13 HISTORY — DX: Congenital insufficiency of aortic valve: Q23.1

## 2016-04-13 LAB — GLUCOSE, CAPILLARY
Glucose-Capillary: 113 mg/dL — ABNORMAL HIGH (ref 65–99)
Glucose-Capillary: 147 mg/dL — ABNORMAL HIGH (ref 65–99)
Glucose-Capillary: 159 mg/dL — ABNORMAL HIGH (ref 65–99)
Glucose-Capillary: 172 mg/dL — ABNORMAL HIGH (ref 65–99)

## 2016-04-13 LAB — TYPE AND SCREEN
ABO/RH(D): O NEG
Antibody Screen: NEGATIVE

## 2016-04-13 SURGERY — HYSTERECTOMY, TOTAL, ROBOT-ASSISTED, LAPAROSCOPIC, WITH BILATERAL SALPINGO-OOPHORECTOMY
Anesthesia: General | Laterality: Left

## 2016-04-13 MED ORDER — OXYCODONE-ACETAMINOPHEN 5-325 MG PO TABS: 1.0000 | ORAL_TABLET | ORAL | Status: DC | PRN

## 2016-04-13 MED ORDER — LOSARTAN POTASSIUM 50 MG PO TABS
100.0000 mg | ORAL_TABLET | Freq: Every day | ORAL | Status: DC
  Administered 2016-04-13 – 2016-04-14 (×2): 100 mg via ORAL
  Filled 2016-04-13 (×2): qty 2

## 2016-04-13 MED ORDER — ONDANSETRON HCL 4 MG/2ML IJ SOLN: 4.0000 mg | Freq: Four times a day (QID) | INTRAMUSCULAR | Status: DC | PRN

## 2016-04-13 MED ORDER — STERILE WATER FOR IRRIGATION IR SOLN
Status: DC | PRN
  Administered 2016-04-13: 1000 mL

## 2016-04-13 MED ORDER — SUGAMMADEX SODIUM 200 MG/2ML IV SOLN
INTRAVENOUS | Status: AC
  Filled 2016-04-13: qty 2

## 2016-04-13 MED ORDER — KCL IN DEXTROSE-NACL 20-5-0.45 MEQ/L-%-% IV SOLN
INTRAVENOUS | Status: DC
  Administered 2016-04-13: 14:00:00 via INTRAVENOUS
  Filled 2016-04-13 (×2): qty 1000

## 2016-04-13 MED ORDER — MEPERIDINE HCL 50 MG/ML IJ SOLN
6.2500 mg | INTRAMUSCULAR | Status: DC | PRN
  Administered 2016-04-13: 12.5 mg via INTRAVENOUS

## 2016-04-13 MED ORDER — HYDROMORPHONE HCL 1 MG/ML IJ SOLN
INTRAMUSCULAR | Status: AC
  Filled 2016-04-13: qty 1

## 2016-04-13 MED ORDER — FENTANYL CITRATE (PF) 100 MCG/2ML IJ SOLN
INTRAMUSCULAR | Status: AC
  Filled 2016-04-13: qty 2

## 2016-04-13 MED ORDER — LOSARTAN POTASSIUM-HCTZ 100-12.5 MG PO TABS: 1.0000 | ORAL_TABLET | Freq: Every day | ORAL | Status: DC

## 2016-04-13 MED ORDER — LACTATED RINGERS IV SOLN
INTRAVENOUS | Status: DC | PRN
  Administered 2016-04-13 (×2): via INTRAVENOUS

## 2016-04-13 MED ORDER — ONDANSETRON HCL 4 MG/2ML IJ SOLN: 4.0000 mg | Freq: Once | INTRAMUSCULAR | Status: DC | PRN

## 2016-04-13 MED ORDER — ROCURONIUM BROMIDE 100 MG/10ML IV SOLN
INTRAVENOUS | Status: DC | PRN
  Administered 2016-04-13: 10 mg via INTRAVENOUS
  Administered 2016-04-13: 40 mg via INTRAVENOUS

## 2016-04-13 MED ORDER — STERILE WATER FOR INJECTION IJ SOLN
INTRAMUSCULAR | Status: AC
  Filled 2016-04-13: qty 10

## 2016-04-13 MED ORDER — GABAPENTIN 600 MG PO TABS
300.0000 mg | ORAL_TABLET | Freq: Every day | ORAL | Status: DC
  Filled 2016-04-13: qty 0.5

## 2016-04-13 MED ORDER — PROPOFOL 10 MG/ML IV BOLUS
INTRAVENOUS | Status: AC
  Filled 2016-04-13: qty 20

## 2016-04-13 MED ORDER — MEPERIDINE HCL 25 MG/ML IJ SOLN
INTRAMUSCULAR | Status: AC
  Filled 2016-04-13: qty 1

## 2016-04-13 MED ORDER — LACTATED RINGERS IR SOLN
Status: DC | PRN
  Administered 2016-04-13: 1000 mL

## 2016-04-13 MED ORDER — ONDANSETRON HCL 4 MG/2ML IJ SOLN
INTRAMUSCULAR | Status: DC | PRN
Start: 2016-04-13 — End: 2016-04-13
  Administered 2016-04-13: 4 mg via INTRAVENOUS

## 2016-04-13 MED ORDER — SUGAMMADEX SODIUM 200 MG/2ML IV SOLN
INTRAVENOUS | Status: DC | PRN
  Administered 2016-04-13: 200 mg via INTRAVENOUS

## 2016-04-13 MED ORDER — IBUPROFEN 800 MG PO TABS: 800.0000 mg | ORAL_TABLET | Freq: Three times a day (TID) | ORAL | Status: DC | PRN

## 2016-04-13 MED ORDER — SUCCINYLCHOLINE CHLORIDE 20 MG/ML IJ SOLN
INTRAMUSCULAR | Status: DC | PRN
  Administered 2016-04-13: 100 mg via INTRAVENOUS

## 2016-04-13 MED ORDER — DEXAMETHASONE SODIUM PHOSPHATE 10 MG/ML IJ SOLN
INTRAMUSCULAR | Status: DC | PRN
  Administered 2016-04-13: 10 mg via INTRAVENOUS

## 2016-04-13 MED ORDER — ENOXAPARIN SODIUM 40 MG/0.4ML ~~LOC~~ SOLN
40.0000 mg | SUBCUTANEOUS | Status: DC
  Administered 2016-04-14: 40 mg via SUBCUTANEOUS
  Filled 2016-04-13: qty 0.4

## 2016-04-13 MED ORDER — ENOXAPARIN SODIUM 40 MG/0.4ML ~~LOC~~ SOLN
40.0000 mg | SUBCUTANEOUS | Status: AC
  Administered 2016-04-13: 40 mg via SUBCUTANEOUS
  Filled 2016-04-13: qty 0.4

## 2016-04-13 MED ORDER — TRAMADOL HCL 50 MG PO TABS: 100.0000 mg | ORAL_TABLET | Freq: Two times a day (BID) | ORAL | Status: DC | PRN

## 2016-04-13 MED ORDER — FENTANYL CITRATE (PF) 100 MCG/2ML IJ SOLN
INTRAMUSCULAR | Status: AC
  Filled 2016-04-13: qty 4

## 2016-04-13 MED ORDER — LIDOCAINE 2% (20 MG/ML) 5 ML SYRINGE
INTRAMUSCULAR | Status: AC
  Filled 2016-04-13: qty 5

## 2016-04-13 MED ORDER — HYDROMORPHONE HCL 1 MG/ML IJ SOLN: 0.2000 mg | INTRAMUSCULAR | Status: DC | PRN

## 2016-04-13 MED ORDER — ONDANSETRON HCL 4 MG/2ML IJ SOLN
INTRAMUSCULAR | Status: AC
  Filled 2016-04-13: qty 2

## 2016-04-13 MED ORDER — HYDROCHLOROTHIAZIDE 12.5 MG PO CAPS
12.5000 mg | ORAL_CAPSULE | Freq: Every day | ORAL | Status: DC
  Administered 2016-04-13 – 2016-04-14 (×2): 12.5 mg via ORAL
  Filled 2016-04-13 (×2): qty 1

## 2016-04-13 MED ORDER — INSULIN ASPART 100 UNIT/ML ~~LOC~~ SOLN
0.0000 [IU] | Freq: Three times a day (TID) | SUBCUTANEOUS | Status: DC
  Administered 2016-04-13: 3 [IU] via SUBCUTANEOUS

## 2016-04-13 MED ORDER — ONDANSETRON HCL 4 MG PO TABS: 4.0000 mg | ORAL_TABLET | Freq: Four times a day (QID) | ORAL | Status: DC | PRN

## 2016-04-13 MED ORDER — GABAPENTIN 300 MG PO CAPS
300.0000 mg | ORAL_CAPSULE | Freq: Every day | ORAL | Status: DC
  Administered 2016-04-13: 300 mg via ORAL
  Filled 2016-04-13: qty 1

## 2016-04-13 MED ORDER — CEFAZOLIN SODIUM-DEXTROSE 2-4 GM/100ML-% IV SOLN
2.0000 g | INTRAVENOUS | Status: AC
  Administered 2016-04-13: 2 g via INTRAVENOUS

## 2016-04-13 MED ORDER — HYDROMORPHONE HCL 1 MG/ML IJ SOLN
0.2500 mg | INTRAMUSCULAR | Status: DC | PRN
  Administered 2016-04-13 (×4): 0.25 mg via INTRAVENOUS

## 2016-04-13 MED ORDER — ROCURONIUM BROMIDE 10 MG/ML (PF) SYRINGE
PREFILLED_SYRINGE | INTRAVENOUS | Status: AC
  Filled 2016-04-13: qty 10

## 2016-04-13 MED ORDER — PNEUMOCOCCAL VAC POLYVALENT 25 MCG/0.5ML IJ INJ
0.5000 mL | INJECTION | INTRAMUSCULAR | Status: DC
  Filled 2016-04-13 (×2): qty 0.5

## 2016-04-13 MED ORDER — LIDOCAINE HCL (CARDIAC) 20 MG/ML IV SOLN
INTRAVENOUS | Status: DC | PRN
  Administered 2016-04-13: 100 mg via INTRAVENOUS

## 2016-04-13 MED ORDER — FENTANYL CITRATE (PF) 100 MCG/2ML IJ SOLN
INTRAMUSCULAR | Status: DC | PRN
  Administered 2016-04-13 (×2): 50 ug via INTRAVENOUS
  Administered 2016-04-13: 25 ug via INTRAVENOUS
  Administered 2016-04-13 (×3): 50 ug via INTRAVENOUS
  Administered 2016-04-13: 25 ug via INTRAVENOUS

## 2016-04-13 MED ORDER — PANTOPRAZOLE SODIUM 40 MG PO TBEC
40.0000 mg | DELAYED_RELEASE_TABLET | Freq: Every day | ORAL | Status: DC
  Administered 2016-04-13 – 2016-04-14 (×2): 40 mg via ORAL
  Filled 2016-04-13 (×2): qty 1

## 2016-04-13 MED ORDER — CEFAZOLIN SODIUM-DEXTROSE 2-4 GM/100ML-% IV SOLN
INTRAVENOUS | Status: AC
  Filled 2016-04-13: qty 100

## 2016-04-13 MED ORDER — DEXAMETHASONE SODIUM PHOSPHATE 10 MG/ML IJ SOLN
INTRAMUSCULAR | Status: AC
  Filled 2016-04-13: qty 1

## 2016-04-13 MED ORDER — PROPOFOL 10 MG/ML IV BOLUS
INTRAVENOUS | Status: DC | PRN
  Administered 2016-04-13: 100 mg via INTRAVENOUS

## 2016-04-13 SURGICAL SUPPLY — 56 items
APL ESCP 34 STRL LF DISP (HEMOSTASIS)
APPLICATOR SURGIFLO ENDO (HEMOSTASIS) IMPLANT
BAG LAPAROSCOPIC 12 15 PORT 16 (BASKET) IMPLANT
BAG RETRIEVAL 12/15 (BASKET)
BAG SPEC RTRVL LRG 6X4 10 (ENDOMECHANICALS)
CHLORAPREP W/TINT 26ML (MISCELLANEOUS) ×2 IMPLANT
COVER SURGICAL LIGHT HANDLE (MISCELLANEOUS) ×2 IMPLANT
COVER TIP SHEARS 8 DVNC (MISCELLANEOUS) ×1 IMPLANT
COVER TIP SHEARS 8MM DA VINCI (MISCELLANEOUS) ×1
DRAPE ARM DVNC X/XI (DISPOSABLE) ×4 IMPLANT
DRAPE COLUMN DVNC XI (DISPOSABLE) ×1 IMPLANT
DRAPE DA VINCI XI ARM (DISPOSABLE) ×4
DRAPE DA VINCI XI COLUMN (DISPOSABLE) ×1
DRAPE SHEET LG 3/4 BI-LAMINATE (DRAPES) ×4 IMPLANT
DRAPE SURG IRRIG POUCH 19X23 (DRAPES) ×2 IMPLANT
ELECT REM PT RETURN 15FT ADLT (MISCELLANEOUS) ×2 IMPLANT
GLOVE BIO SURGEON STRL SZ 6 (GLOVE) ×8 IMPLANT
GLOVE BIO SURGEON STRL SZ 6.5 (GLOVE) ×4 IMPLANT
GOWN STRL REUS W/ TWL LRG LVL3 (GOWN DISPOSABLE) ×2 IMPLANT
GOWN STRL REUS W/TWL LRG LVL3 (GOWN DISPOSABLE) ×4
HOLDER FOLEY CATH W/STRAP (MISCELLANEOUS) ×2 IMPLANT
IRRIG SUCT STRYKERFLOW 2 WTIP (MISCELLANEOUS) ×2
IRRIGATION SUCT STRKRFLW 2 WTP (MISCELLANEOUS) ×1 IMPLANT
KIT BASIN OR (CUSTOM PROCEDURE TRAY) ×2 IMPLANT
KIT PROCEDURE DA VINCI SI (MISCELLANEOUS) ×1
KIT PROCEDURE DVNC SI (MISCELLANEOUS) IMPLANT
LIQUID BAND (GAUZE/BANDAGES/DRESSINGS) ×2 IMPLANT
MANIPULATOR UTERINE 4.5 ZUMI (MISCELLANEOUS) ×2 IMPLANT
MARKER SKIN DUAL TIP RULER LAB (MISCELLANEOUS) ×2 IMPLANT
NDL SAFETY ECLIPSE 18X1.5 (NEEDLE) ×1 IMPLANT
NDL SPNL 18GX3.5 QUINCKE PK (NEEDLE) ×1 IMPLANT
NEEDLE HYPO 18GX1.5 SHARP (NEEDLE) ×2
NEEDLE SPNL 18GX3.5 QUINCKE PK (NEEDLE) ×2 IMPLANT
OBTURATOR XI 8MM BLADELESS (TROCAR) ×2 IMPLANT
OCCLUDER COLPOPNEUMO (BALLOONS) ×2 IMPLANT
PAD POSITIONING PINK XL (MISCELLANEOUS) ×2 IMPLANT
PORT ACCESS TROCAR AIRSEAL 12 (TROCAR) ×1 IMPLANT
PORT ACCESS TROCAR AIRSEAL 5M (TROCAR) ×1
POUCH SPECIMEN RETRIEVAL 10MM (ENDOMECHANICALS) IMPLANT
SEAL CANN UNIV 5-8 DVNC XI (MISCELLANEOUS) ×4 IMPLANT
SEAL XI 5MM-8MM UNIVERSAL (MISCELLANEOUS) ×4
SET TRI-LUMEN FLTR TB AIRSEAL (TUBING) ×2 IMPLANT
SHEET LAVH (DRAPES) ×2 IMPLANT
SOLUTION ELECTROLUBE (MISCELLANEOUS) ×2 IMPLANT
SURGIFLO W/THROMBIN 8M KIT (HEMOSTASIS) IMPLANT
SUT MNCRL AB 4-0 PS2 18 (SUTURE) ×4 IMPLANT
SUT VIC AB 0 CT1 27 (SUTURE) ×2
SUT VIC AB 0 CT1 27XBRD ANTBC (SUTURE) ×1 IMPLANT
SYR 50ML LL SCALE MARK (SYRINGE) ×2 IMPLANT
SYRINGE 10CC LL (SYRINGE) ×2 IMPLANT
TOWEL OR 17X26 10 PK STRL BLUE (TOWEL DISPOSABLE) ×4 IMPLANT
TOWEL OR NON WOVEN STRL DISP B (DISPOSABLE) ×2 IMPLANT
TRAY LAPAROSCOPIC (CUSTOM PROCEDURE TRAY) ×2 IMPLANT
TROCAR BLADELESS OPT 5 100 (ENDOMECHANICALS) ×2 IMPLANT
UNDERPAD 30X30 (UNDERPADS AND DIAPERS) ×2 IMPLANT
UNDERPAD 30X30 INCONTINENT (UNDERPADS AND DIAPERS) ×2 IMPLANT

## 2016-04-13 NOTE — Op Note (Signed)
OPERATIVE NOTE 04/13/16  Surgeon: Donaciano Eva   Assistants: Dr Lahoma Crocker (an MD assistant was necessary for tissue manipulation, management of robotic instrumentation, retraction and positioning due to the complexity of the case and hospital policies).   Anesthesia: General endotracheal anesthesia  ASA Class: 3  Pre-operative Diagnosis: endometrial cancer  Post-operative Diagnosis: same  Operation: Robotic-assisted laparoscopic total hysterectomy with right salpingoophorectomy and SLN biopsy  Surgeon: Donaciano Eva  Assistant Surgeon: Lahoma Crocker MD  Anesthesia: GET  Urine Output: 200  Operative Findings:  : 8cm fibroid uterus, normal appearing cervix, surgically absent left tube and ovary, normal right tube and ovary, no suspicious lymph nodes. Small omental adhesions to very small umbilical hernia.  Estimated Blood Loss:  <20      Total IV Fluids: 600 ml         Specimens: uterus, cervix, right tube and ovary, right external iliac SLN, right obturator SLN, left obturator SLN.         Complications:  None; patient tolerated the procedure well.         Disposition: PACU - hemodynamically stable.  Procedure Details  The patient was seen in the Holding Room. The risks, benefits, complications, treatment options, and expected outcomes were discussed with the patient.  The patient concurred with the proposed plan, giving informed consent.  The site of surgery properly noted/marked. The patient was identified as Susan Haney and the procedure verified as a Robotic-assisted hysterectomy with bilateral salpingo oophorectomy. A Time Out was held and the above information confirmed.  After induction of anesthesia, the patient was draped and prepped in the usual sterile manner. Pt was placed in supine position after anesthesia and draped and prepped in the usual sterile manner. The abdominal drape was placed after the CholoraPrep had been allowed to dry  for 3 minutes.  Her arms were tucked to her side with all appropriate precautions.  The shoulders were stabilized with padded shoulder blocks applied to the acromium processes.  The patient was placed in the semi-lithotomy position in [place].  The perineum was prepped with Betadine. The patient was then prepped. Foley catheter was placed.  A sterile speculum was placed in the vagina.  The cervix was grasped with a single-tooth tenaculum and dilated with Kennon Rounds dilators. 2mg  total of ICG was injected into the cervical stroma at 2 and 9 o'clock at a 9mm depth (concentration 0..5mg /ml).   The ZUMI uterine manipulator with a medium colpotomizer ring was placed without difficulty.  A pneum occluder balloon was placed over the manipulator.  OG tube placement was confirmed and to suction.   Next, a 5 mm skin incision was made 1 cm below the subcostal margin in the midclavicular line.  The 5 mm Optiview port and scope was used for direct entry.  Opening pressure was under 10 mm CO2.  The abdomen was insufflated and the findings were noted as above.   At this point and all points during the procedure, the patient's intra-abdominal pressure did not exceed 15 mmHg. Next, a 10 mm skin incision was made in the umbilicus and a right and left port was placed about 10 cm lateral to the robot port on the right and left side.  A fourth arm was placed in the left lower quadrant 2 cm above and superior and medial to the anterior superior iliac spine.  All ports were placed under direct visualization.  The patient was placed in steep Trendelenburg.  Bowel was folded away into  the upper abdomen.  The robot was docked in the normal manner.  The right and left peritoneum were opened parallel to the IP ligament to open the retroperitoneal spaces bilaterally. The SLN mapping was performed in bilateral pelvic basins. The para rectal and paravesical spaces were opened up. Lymphatic channels were identified travelling to the  following visualized sentinel lymph node's: right external iliac and obturator, left obturator. These SLN's were separated from their surrounding lymphatic tissue, removed and sent for permanent pathology.  The hysterectomy was started after the round ligament on the right side was incised and the retroperitoneum was entered and the pararectal space was developed.  The ureter was noted to be on the medial leaf of the broad ligament.  The peritoneum above the ureter was incised and stretched and the infundibulopelvic ligament was skeletonized, cauterized and cut.  The posterior peritoneum was taken down to the level of the KOH ring.  The anterior peritoneum was also taken down.  The bladder flap was created to the level of the KOH ring.  The uterine artery on the right side was skeletonized, cauterized and cut in the normal manner.  A similar procedure was performed on the left.  The colpotomy was made and the uterus, cervix, right ovary and tube were amputated and delivered through the vagina.  Pedicles were inspected and excellent hemostasis was achieved.    The colpotomy at the vaginal cuff was closed with Vicryl on a CT1 needle in a running manner.  Irrigation was used and excellent hemostasis was achieved.  At this point in the procedure was completed.  Robotic instruments were removed under direct visulaization.  The robot was undocked. The 10 mm ports were closed with Vicryl on a UR-5 needle and the fascia was closed with 0 Vicryl on a UR-5 needle.  The skin was closed with 4-0 Vicryl in a subcuticular manner.  Dermabond was applied.  Sponge, lap and needle counts correct x 2.  The patient was taken to the recovery room in stable condition.  The vagina was swabbed with  minimal bleeding noted.   All instrument and needle counts were correct x  3.   The patient was transferred to the recovery room in a stable condition.  Donaciano Eva, MD

## 2016-04-13 NOTE — Anesthesia Postprocedure Evaluation (Signed)
Anesthesia Post Note  Patient: Susan Haney  Procedure(s) Performed: Procedure(s) (LRB): XI ROBOTIC ASSISTED TOTAL HYSTERECTOMY WITH LEFT SALPINGO OOPHORECTOMY SENTINEL LYMPH NODE BIOPSY (Left)  Patient location during evaluation: PACU Anesthesia Type: General Level of consciousness: awake and alert Pain management: pain level controlled Vital Signs Assessment: post-procedure vital signs reviewed and stable Respiratory status: spontaneous breathing, nonlabored ventilation, respiratory function stable and patient connected to nasal cannula oxygen Cardiovascular status: blood pressure returned to baseline and stable Postop Assessment: no signs of nausea or vomiting Anesthetic complications: no    Last Vitals:  Vitals:   04/13/16 0540  BP: (!) 159/67  Pulse: 92  Resp: 18  Temp: 36.3 C    Last Pain:  Vitals:   04/13/16 0540  TempSrc: Oral                 Zenaida Deed

## 2016-04-13 NOTE — Interval H&P Note (Signed)
History and Physical Interval Note:  04/13/2016 6:48 AM  Susan Haney  has presented today for surgery, with the diagnosis of Endrometrial Cancer  The various methods of treatment have been discussed with the patient and family. After consideration of risks, benefits and other options for treatment, the patient has consented to  Procedure(s): XI ROBOTIC ASSISTED TOTAL HYSTERECTOMY WITH BILATERAL SALPINGO OOPHORECTOMY SENTINEL LYMPH NODE BIOPSY (Bilateral) as a surgical intervention .  The patient's history has been reviewed, patient examined, no change in status, stable for surgery.  I have reviewed the patient's chart and labs.  Questions were answered to the patient's satisfaction.     Donaciano Eva

## 2016-04-13 NOTE — Anesthesia Preprocedure Evaluation (Signed)
Anesthesia Evaluation  Patient identified by MRN, date of birth, ID band Patient awake    Reviewed: Allergy & Precautions, NPO status , Patient's Chart, lab work & pertinent test results  History of Anesthesia Complications Negative for: history of anesthetic complications  Airway Mallampati: II  TM Distance: >3 FB     Dental no notable dental hx. (+) Dental Advisory Given   Pulmonary neg pulmonary ROS,    Pulmonary exam normal        Cardiovascular hypertension, Normal cardiovascular exam  Bicuspid aortic valve   Neuro/Psych  Headaches, negative psych ROS   GI/Hepatic Neg liver ROS, GERD  ,  Endo/Other  diabetes  Renal/GU negative Renal ROS     Musculoskeletal  (+) Arthritis ,   Abdominal   Peds  Hematology  (+) anemia ,   Anesthesia Other Findings   Reproductive/Obstetrics                             Anesthesia Physical  Anesthesia Plan  ASA: III  Anesthesia Plan: General   Post-op Pain Management:    Induction: Intravenous  Airway Management Planned: Oral ETT  Additional Equipment:   Intra-op Plan:   Post-operative Plan: Extubation in OR  Informed Consent: I have reviewed the patients History and Physical, chart, labs and discussed the procedure including the risks, benefits and alternatives for the proposed anesthesia with the patient or authorized representative who has indicated his/her understanding and acceptance.   Dental advisory given  Plan Discussed with: CRNA and Anesthesiologist  Anesthesia Plan Comments:         Anesthesia Quick Evaluation

## 2016-04-13 NOTE — Transfer of Care (Signed)
Immediate Anesthesia Transfer of Care Note  Patient: Susan Haney  Procedure(s) Performed: Procedure(s): XI ROBOTIC ASSISTED TOTAL HYSTERECTOMY WITH LEFT SALPINGO OOPHORECTOMY SENTINEL LYMPH NODE BIOPSY (Left)  Patient Location: PACU  Anesthesia Type:General  Level of Consciousness:  sedated, patient cooperative and responds to stimulation  Airway & Oxygen Therapy:Patient Spontanous Breathing and Patient connected to face mask oxgen  Post-op Assessment:  Report given to PACU RN and Post -op Vital signs reviewed and stable  Post vital signs:  Reviewed and stable  Last Vitals:  Vitals:   04/13/16 0540  BP: (!) 159/67  Pulse: 92  Resp: 18  Temp: [id number] C    Complications: No apparent anesthesia complications

## 2016-04-13 NOTE — Anesthesia Procedure Notes (Signed)
Procedure Name: Intubation Date/Time: 04/13/2016 7:41 AM Performed by: Maxwell Caul Pre-anesthesia Checklist: Patient identified, Emergency Drugs available, Suction available and Patient being monitored Patient Re-evaluated:Patient Re-evaluated prior to inductionOxygen Delivery Method: Circle system utilized Preoxygenation: Pre-oxygenation with 100% oxygen Intubation Type: IV induction Ventilation: Mask ventilation without difficulty Laryngoscope Size: Mac and 4 Grade View: Grade II Tube type: Oral Tube size: 7.0 mm Number of attempts: 1 Airway Equipment and Method: Stylet Placement Confirmation: ETT inserted through vocal cords under direct vision,  positive ETCO2 and breath sounds checked- equal and bilateral Secured at: 21 cm Tube secured with: Tape Dental Injury: Teeth and Oropharynx as per pre-operative assessment

## 2016-04-13 NOTE — H&P (View-Only) (Signed)
Consult Note: Gyn-Onc   Susan Haney 82 y.o. female  Chief Complaint  Patient presents with  . endometrial cancer    follow up visit    Assessment :Endometrial adenocarcinoma (grade 1-2), s/p recent left LE fracture requiring TKR and delaying initial plan for surgery in August.   Plan:  She is now doing much better and can flex knee to 90 degrees. She has been taking megace while awaiting surgery for her cancer.   A detailed discussion was held with the patient and her family with regard to to her endometrial cancer diagnosis. We discussed the standard management options for uterine cancer which includes surgery followed possibly by adjuvant therapy depending on the results of surgery. The options for surgical management include a hysterectomy and removal of the tubes and ovaries possibly with removal of pelvic and para-aortic lymph nodes. A minimally invasive approach including a robotic hysterectomy or laparoscopic hysterectomy have benefits including shorter hospital stay, recovery time and better wound healing. The alternative approach is an open hysterectomy. The patient has been counseled about these surgical options and the risks of surgery in general including infection, bleeding, damage to surrounding structures (including bowel, bladder, ureters, nerves or vessels), and the postoperative risks of PE/ DVT, and lymphedema. I extensively reviewed the additional risks of robotic hysterectomy including possible need for conversion to open laparotomy.  I discussed positioning during surgery of trendelenberg and risks of minor facial swelling and care we take in preoperative positioning.  After counseling and consideration of her options, she desires to proceed with robotic hysterectomy, BSO, SLN biopsy.   We will position her while awake to protect her left knee.  She will be seen by anesthesia for preoperative clearance and discussion of postoperative pain management.  She was given the  opportunity to ask questions, which were answered to her satisfaction, and she is agreement with the above mentioned plan of care.  She has issues with chronic constipation and therefore we will recommend 1 bottle mag citrate preop.   HPI: The patient had onset of postmenopausal bleeding approximately a month ago. This was not associated with any pain or other symptoms. An endometrial biopsy was obtained showing a grade 1-2 endometrial adenocarcinoma. Pelvic ultrasound showed the uterus to measure 7 x 4.4 x 5.6 cm with 2 small fibroids. The left ovary surgically absent right ovary appeared normal there is no free fluid.  Today patient is having some vaginal bleeding. She also notes some increased edema and pain in her left lower extremity below the knee.  The patient has a past history of a benign left ovarian cyst which was removed laparoscopically. The patient underwent menopause at age 4. Pap smears are normal  Obstetrical history gravida 2 para 2  Comorbidities include diabetes valvular heart disease osteoporosis lumbar disc disease and obesity.  Interval Hx: Her original surgery date had to be postponed when she acutely fell and fractured her left distal femur assoicated with a knee replacement on 02/12/16. She required emergent repair and replacement and had been in rehab since that time. She is now ambulating with a walker.  Review of Systems:10 point review of systems is negative except as noted in interval history.   Vitals: Blood pressure (!) 155/69, pulse 78, temperature 98.9 F (37.2 C), temperature source Oral, resp. rate 18, height 5\' 3"  (1.6 m), weight 198 lb (89.8 kg), SpO2 99 %.  Physical Exam: General : The patient is a healthy woman in no acute distress.  HEENT: normocephalic, extraoccular  movements normal; neck is supple without thyromegally  Lynphnodes: Supraclavicular and inguinal nodes not enlarged  Abdomen: Soft, non-tender, no ascites, no organomegally, no masses,  no hernias  Pelvic:  EGBUS: Normal female (atrophic) Vagina: Normal, atrophic, no lesions  Urethra and Bladder: Normal, non-tender  Cervix: Normal no lesions are noted Uterus: Retroverted normal shape size and consistency  Bi-manual examination: Non-tender; no adenxal masses or nodularity  Rectal: normal sphincter tone, no masses, no blood  Lower extremities: incision healing normally on left leg/knee with no signs of infection. Can flex to 100 degrees.      No Known Allergies  Past Medical History:  Diagnosis Date  . Anemia    history ,  took iron for years  . Arthritis   . Chronic back pain   . Diabetes ([place])    x 15 yrs with good control  . GERD (gastroesophageal reflux disease)   . Headache(784.0)   . Hypertension     Past Surgical History:  Procedure Laterality Date  . COLONOSCOPY N/A 04/04/2014   Procedure: COLONOSCOPY;  Surgeon: Rogene Houston, MD;  [facility];  Service: Endoscopy;  Laterality: N/A;  100  . FOOT SURGERY     Left  . Ovary removed: benign    . REPLACEMENT TOTAL KNEE BILATERAL     2013  . TOTAL KNEE REVISION Left 02/12/2016   Procedure: LEFT TOTAL KNEE REVISION;  Surgeon: Rod Can, MD;  Location: [facility];  Service: Orthopedics;  Laterality: Left;    Current Outpatient Prescriptions  Medication Sig Dispense Refill  . cholecalciferol (VITAMIN D) 400 UNITS TABS tablet Take 2,000 Units by mouth.    . docusate sodium (COLACE) 100 MG capsule Take 100 mg by mouth 2 (two) times daily.    . ferrous sulfate 325 (65 FE) MG tablet Take 1 tablet (325 mg total) by mouth 2 (two) times daily with a meal.  3  . HYDROcodone-acetaminophen (NORCO/VICODIN) 5-325 MG tablet Take 1-2 tablets by mouth every 6 (six) hours as needed for moderate pain. 80 tablet 0  . losartan-hydrochlorothiazide (HYZAAR) 100-12.5 MG tablet Take 1 tablet by mouth daily.  3  . megestrol (MEGACE) 40 MG tablet Take 1 tablet (40 mg total) by mouth 2 (two) times daily. 60 tablet 1   . metFORMIN (GLUMETZA) 500 MG (MOD) 24 hr tablet Take 500 mg by mouth every evening.    . Multiple Vitamins-Minerals (CENTRUM SILVER ADULT 50+) TABS Take 1 tablet by mouth daily.     . naproxen (NAPROSYN) 500 MG tablet Take 500 mg by mouth 2 (two) times daily with a meal.    . Omega-3 Fatty Acids (OMEGA 3 500 PO) Take 4 tablets by mouth.     Marland Kitchen omeprazole (PRILOSEC) 20 MG capsule TAKE 1 CAPSULE (20 MG TOTAL) BY MOUTH DAILY. 90 capsule 3  . polyethylene glycol (MIRALAX / GLYCOLAX) packet Take 17 g by mouth daily as needed for moderate constipation.  0  . Probiotic Product (PHILLIPS COLON HEALTH PO) Take 1 tablet by mouth daily.     . sitaGLIPtin (JANUVIA) 100 MG tablet Take 100 mg by mouth daily.     No current facility-administered medications for this visit.     Social History   Social History  . Marital status: Widowed    Spouse name: N/A  . Number of children: N/A  . Years of education: N/A   Occupational History  . Not on file.   Social History Main Topics  . Smoking status: Never  Smoker  . Smokeless tobacco: Never Used  . Alcohol use No  . Drug use: No  . Sexual activity: Not Currently   Other Topics Concern  . Not on file   Social History Narrative  . No narrative on file    Family History  Problem Relation Age of Onset  . Diabetes Maternal Grandmother       Donaciano Eva, MD 04/07/2016, 2:29 PM

## 2016-04-14 DIAGNOSIS — C541 Malignant neoplasm of endometrium: Secondary | ICD-10-CM | POA: Diagnosis not present

## 2016-04-14 LAB — BASIC METABOLIC PANEL
Anion gap: 7 (ref 5–15)
BUN: 20 mg/dL (ref 6–20)
CALCIUM: 9.3 mg/dL (ref 8.9–10.3)
CO2: 22 mmol/L (ref 22–32)
CREATININE: 0.92 mg/dL (ref 0.44–1.00)
Chloride: 111 mmol/L (ref 101–111)
GFR calc Af Amer: >60 mL/min (ref >60)
GFR calc non Af Amer: 56 mL/min — ABNORMAL LOW (ref >60)
GLUCOSE: 106 mg/dL — AB (ref 65–99)
Potassium: 4 mmol/L (ref 3.5–5.1)
Sodium: 140 mmol/L (ref 135–145)

## 2016-04-14 LAB — CBC
HEMATOCRIT: 29.8 % — AB (ref 36.0–46.0)
Hemoglobin: 9.4 g/dL — ABNORMAL LOW (ref 12.0–15.0)
MCH: 27.8 pg (ref 26.0–34.0)
MCHC: 31.5 g/dL (ref 30.0–36.0)
MCV: 88.2 fL (ref 78.0–100.0)
Platelets: 288 10*3/uL (ref 150–400)
RBC: 3.38 MIL/uL — AB (ref 3.87–5.11)
RDW: 16.4 % — AB (ref 11.5–15.5)
WBC: 13.2 10*3/uL — AB (ref 4.0–10.5)

## 2016-04-14 LAB — GLUCOSE, CAPILLARY: Glucose-Capillary: 115 mg/dL — ABNORMAL HIGH (ref 65–99)

## 2016-04-14 MED ORDER — HYDROCODONE-ACETAMINOPHEN 5-325 MG PO TABS: 1.0000 | ORAL_TABLET | Freq: Four times a day (QID) | ORAL | 0 refills | Status: DC | PRN

## 2016-04-14 NOTE — Discharge Summary (Signed)
Physician Discharge Summary  Patient ID: Susan Haney MRN: [id number] DOB/AGE: 80-Oct-1933 80 y.o.  Admit date: 04/13/2016 Discharge date: 04/14/2016  Admission Diagnoses: Endometrial cancer [facility])  Discharge Diagnoses:  Principal Problem:   Endometrial cancer [facility])   Discharged Condition:  The patient is in good condition and stable for discharge.    Hospital Course: On 04/13/2016, the patient underwent the following: Procedure(s): XI ROBOTIC ASSISTED TOTAL HYSTERECTOMY WITH LEFT SALPINGO OOPHORECTOMY SENTINEL LYMPH NODE BIOPSY.   The postoperative course was uneventful.  She was discharged to home on postoperative day 1 tolerating a regular diet, voiding, pain controlled with PRN medications, ambulating.  Consults: None  Significant Diagnostic Studies: None  Treatments: surgery: see above  Discharge Exam: Blood pressure (!) 138/59, pulse 77, temperature 98.7 F (37.1 C), temperature source Oral, resp. rate 20, height 5\' 3"  (1.6 m), weight 199 lb 15.3 oz (90.7 kg), SpO2 98 %. General appearance: alert, cooperative and no distress Resp: clear to auscultation bilaterally Cardio: regular rate and rhythm, S1, S2 normal, no murmur, click, rub or gallop GI: soft, non-tender; bowel sounds normal; no masses,  no organomegaly Extremities: extremities normal, atraumatic, no cyanosis or edema and left knee incision healing with dry dressing in place, healing scab noted on the incision, no erythema or drainage, able to bend knee, full strength against resistance Incision/Wound: Lap sites to the abdomen with dermabond without erythema or drainage  Disposition:Home  Discharge Instructions    Call MD for:  difficulty breathing, headache or visual disturbances    Complete by:  As directed    Call MD for:  extreme fatigue    Complete by:  As directed    Call MD for:  hives    Complete by:  As directed    Call MD for:  persistant dizziness or light-headedness    Complete by:  As directed     Call MD for:  persistant nausea and vomiting    Complete by:  As directed    Call MD for:  redness, tenderness, or signs of infection (pain, swelling, redness, odor or green/yellow discharge around incision site)    Complete by:  As directed    Call MD for:  severe uncontrolled pain    Complete by:  As directed    Call MD for:  temperature >100.4    Complete by:  As directed    Diet - low sodium heart healthy    Complete by:  As directed    Driving Restrictions    Complete by:  As directed    No driving for 1 week.  Do not take narcotics and drive.   Increase activity slowly    Complete by:  As directed    Lifting restrictions    Complete by:  As directed    No lifting greater than 10 lbs.   Sexual Activity Restrictions    Complete by:  As directed    No sexual activity, nothing in the vagina, for 8 weeks.       Medication List    STOP taking these medications   megestrol 40 MG tablet Commonly known as:  MEGACE     TAKE these medications   CENTRUM SILVER ADULT 50+ Tabs Take 1 tablet by mouth daily.   cholecalciferol 400 units Tabs tablet Commonly known as:  VITAMIN D Take 2,000 Units by mouth.   docusate sodium 100 MG capsule Commonly known as:  COLACE Take 100 mg by mouth 2 (two) times daily.   ferrous sulfate  325 (65 FE) MG tablet Take 1 tablet (325 mg total) by mouth 2 (two) times daily with a meal.   HYDROcodone-acetaminophen 5-325 MG tablet Commonly known as:  NORCO/VICODIN Take 1-2 tablets by mouth every 6 (six) hours as needed for moderate pain.   losartan-hydrochlorothiazide 100-12.5 MG tablet Commonly known as:  HYZAAR Take 1 tablet by mouth daily.   metFORMIN 500 MG (MOD) 24 hr tablet Commonly known as:  GLUMETZA Take 500 mg by mouth every evening.   naproxen 500 MG tablet Commonly known as:  NAPROSYN Take 500 mg by mouth 2 (two) times daily with a meal.   OMEGA 3 500 PO Take 4 tablets by mouth.   omeprazole 20 MG capsule Commonly known  as:  PRILOSEC TAKE 1 CAPSULE (20 MG TOTAL) BY MOUTH DAILY.   PHILLIPS COLON HEALTH PO Take 1 tablet by mouth daily.   polyethylene glycol packet Commonly known as:  MIRALAX / GLYCOLAX Take 17 g by mouth daily as needed for moderate constipation.   sitaGLIPtin 100 MG tablet Commonly known as:  JANUVIA Take 100 mg by mouth daily.      Follow-up Information    Donaciano Eva, MD Follow up on 05/05/2016.   Specialty:  Obstetrics and Gynecology Why:  at 3pm at the [facility] for post-op follow up Contact information: [address] [place] [address] [phone]           Greater than thirty minutes were spend for face to face discharge instructions and discharge orders/summary in EPIC.   Signed: Lyllie Cobbins DEAL 04/14/2016, 10:17 AM

## 2016-04-14 NOTE — Progress Notes (Signed)
Patient voided and tolerated diet.  No complaints of pain,  Discharge instructions along with prescriptions provided to patient.  Questions answered

## 2016-04-14 NOTE — Discharge Instructions (Signed)
04/14/2016  Return to work: 4-6 weeks if applicable  Activity: 1. Be up and out of the bed during the day.  Take a nap if needed.  You may walk up steps but be careful and use the hand rail.  Stair climbing will tire you more than you think, you may need to stop part way and rest.   2. No lifting or straining for 6 weeks.  3. No driving for 1 week(s).  Do not drive if you are taking narcotic pain medicine.  4. Shower daily.  Use soap and water on your incision and pat dry; don't rub.  No tub baths until cleared by your surgeon.   5. No sexual activity and nothing in the vagina for 8 weeks.  6. You may experience a small amount of clear drainage from your incisions, which is normal.  If the drainage persists or increases, please call the office.   Diet: 1. Low sodium Heart Healthy Diet is recommended.  2. It is safe to use a laxative, such as Miralax or Colace, if you have difficulty moving your bowels.   Wound Care: 1. Keep clean and dry.  Shower daily.  Reasons to call the Doctor:  Fever - Oral temperature greater than 100.4 degrees Fahrenheit  Foul-smelling vaginal discharge  Difficulty urinating  Nausea and vomiting  Increased pain at the site of the incision that is unrelieved with pain medicine.  Difficulty breathing with or without chest pain  New calf pain especially if only on one side  Sudden, continuing increased vaginal bleeding with or without clots.   Contacts: For questions or concerns you should contact:  Dr. Everitt Amber at [phone]  [name], NP at [phone]  After Hours: call [phone] and have the GYN Oncologist paged/contacted  Acetaminophen; Hydrocodone tablets or capsules What is this medicine? ACETAMINOPHEN; HYDROCODONE (a set a MEE noe fen; hye droe KOE done) is a pain reliever. It is used to treat moderate to severe pain. This medicine may be used for other purposes; ask your health care provider or pharmacist if you have  questions. What should I tell my health care provider before I take this medicine? They need to know if you have any of these conditions: -brain tumor -Crohn's disease, inflammatory bowel disease, or ulcerative colitis -drug abuse or addiction -head injury -heart or circulation problems -if you often drink alcohol -kidney disease or problems going to the bathroom -liver disease -lung disease, asthma, or breathing problems -an unusual or allergic reaction to acetaminophen, hydrocodone, other opioid analgesics, other medicines, foods, dyes, or preservatives -pregnant or trying to get pregnant -breast-feeding How should I use this medicine? Take this medicine by mouth. Swallow it with a full glass of water. Follow the directions on the prescription label. If the medicine upsets your stomach, take the medicine with food or milk. Do not take more than you are told to take. Talk to your pediatrician regarding the use of this medicine in children. This medicine is not approved for use in children. Patients over 65 years may have a stronger reaction and need a smaller dose. Overdosage: If you think you have taken too much of this medicine contact a poison control center or emergency room at once. NOTE: This medicine is only for you. Do not share this medicine with others. What if I miss a dose? If you miss a dose, take it as soon as you can. If it is almost time for your next dose, take only that dose. Do  not take double or extra doses. What may interact with this medicine? -alcohol -antihistamines -isoniazid -medicines for depression, anxiety, or psychotic disturbances -medicines for sleep -muscle relaxants -naltrexone -narcotic medicines (opiates) for pain -phenobarbital -ritonavir -tramadol This list may not describe all possible interactions. Give your health care provider a list of all the medicines, herbs, non-prescription drugs, or dietary supplements you use. Also tell them if you  smoke, drink alcohol, or use illegal drugs. Some items may interact with your medicine. What should I watch for while using this medicine? Tell your doctor or health care professional if your pain does not go away, if it gets worse, or if you have new or a different type of pain. You may develop tolerance to the medicine. Tolerance means that you will need a higher dose of the medicine for pain relief. Tolerance is normal and is expected if you take the medicine for a long time. Do not suddenly stop taking your medicine because you may develop a severe reaction. Your body becomes used to the medicine. This does NOT mean you are addicted. Addiction is a behavior related to getting and using a drug for a non-medical reason. If you have pain, you have a medical reason to take pain medicine. Your doctor will tell you how much medicine to take. If your doctor wants you to stop the medicine, the dose will be slowly lowered over time to avoid any side effects. You may get drowsy or dizzy when you first start taking the medicine or change doses. Do not drive, use machinery, or do anything that may be dangerous until you know how the medicine affects you. Stand or sit up slowly. There are different types of narcotic medicines (opiates) for pain. If you take more than one type at the same time, you may have more side effects. Give your health care provider a list of all medicines you use. Your doctor will tell you how much medicine to take. Do not take more medicine than directed. Call emergency for help if you have problems breathing. The medicine will cause constipation. Try to have a bowel movement at least every 2 to 3 days. If you do not have a bowel movement for 3 days, call your doctor or health care professional. Too much acetaminophen can be very dangerous. Do not take Tylenol (acetaminophen) or medicines that contain acetaminophen with this medicine. Many non-prescription medicines contain acetaminophen.  Always read the labels carefully. What side effects may I notice from receiving this medicine? Side effects that you should report to your doctor or health care professional as soon as possible: -allergic reactions like skin rash, itching or hives, swelling of the face, lips, or tongue -breathing problems -confusion -feeling faint or lightheaded, falls -stomach pain -yellowing of the eyes or skin Side effects that usually do not require medical attention (report to your doctor or health care professional if they continue or are bothersome): -nausea, vomiting -stomach upset This list may not describe all possible side effects. Call your doctor for medical advice about side effects. You may report side effects to FDA at 1-800-FDA-1088. Where should I keep my medicine? Keep out of the reach of children. This medicine can be abused. Keep your medicine in a safe place to protect it from theft. Do not share this medicine with anyone. Selling or giving away this medicine is dangerous and against the law. This medicine may cause accidental overdose and death if it taken by other adults, children, or pets. Mix any unused  medicine with a substance like cat litter or coffee grounds. Then throw the medicine away in a sealed container like a sealed bag or a coffee can with a lid. Do not use the medicine after the expiration date. Store at room temperature between 15 and 30 degrees C (59 and 86 degrees F). NOTE: This sheet is a summary. It may not cover all possible information. If you have questions about this medicine, talk to your doctor, pharmacist, or health care provider.    2016, Elsevier/Gold Standard. (2014-06-05 15:29:20)

## 2016-04-14 NOTE — Progress Notes (Signed)
Date: April 14, 2016 Discharge orders checked for needs. No needs present at time of discharge. Velva Harman, RN, BSN, [place]   [phone]

## 2016-04-16 ENCOUNTER — Ambulatory Visit: Payer: Medicare Other | Admitting: Physical Therapy

## 2016-04-16 DIAGNOSIS — R600 Localized edema: Secondary | ICD-10-CM

## 2016-04-16 DIAGNOSIS — M25662 Stiffness of left knee, not elsewhere classified: Secondary | ICD-10-CM

## 2016-04-16 DIAGNOSIS — M25562 Pain in left knee: Secondary | ICD-10-CM

## 2016-04-16 NOTE — Therapy (Signed)
[facility] [place], [place], [address] Phone: [phone]   Fax:  [phone]  Physical Therapy Treatment  Patient Details  Name: Susan Haney MRN: [id number] Date of Birth: [birth year] Referring Provider: Rod Can MD  Encounter Date: 04/16/2016      PT End of Session - 04/16/16 1257    PT Stop Time [id number]   Activity Tolerance Patient tolerated treatment well   Behavior During Therapy [facility] for tasks assessed/performed      Past Medical History:  Diagnosis Date  . Anemia    history ,  took iron for years  . Arthritis   . Bicuspid aortic valve   . Cancer [facility])    endometrial cancer  . Chronic back pain   . Diabetes ([place])    x 15 yrs with good control  . GERD (gastroesophageal reflux disease)   . Hypertension     Past Surgical History:  Procedure Laterality Date  . BUNIONECTOMY    . COLONOSCOPY N/A 04/04/2014   Procedure: COLONOSCOPY;  Surgeon: Rogene Houston, MD;  [facility];  Service: Endoscopy;  Laterality: N/A;  100  . EYE SURGERY     bil cataracts and lens implants  . FOOT SURGERY     Left  . Ovary removed: benign    . REPLACEMENT TOTAL KNEE BILATERAL     2013  . ROBOTIC ASSISTED TOTAL HYSTERECTOMY WITH BILATERAL SALPINGO OOPHERECTOMY Left 04/13/2016   Procedure: XI ROBOTIC ASSISTED TOTAL HYSTERECTOMY WITH LEFT SALPINGO OOPHORECTOMY SENTINEL LYMPH NODE BIOPSY;  Surgeon: Everitt Amber, MD;  Location: [facility];  Service: Gynecology;  Laterality: Left;  . TOTAL KNEE REVISION Left 02/12/2016   Procedure: LEFT TOTAL KNEE REVISION;  Surgeon: Rod Can, MD;  Location: [facility];  Service: Orthopedics;  Laterality: Left;    There were no vitals filed for this visit.      Subjective Assessment - 04/16/16 1252    Subjective These treatments have helped a lot.  That stimulator is helping a lot.   Pain Score 4    Pain Location Knee   Pain Orientation Left   Pain Descriptors / Indicators Aching    Pain Type Surgical pain   Pain Onset More than a month ago    Treatment:  VMS to left quadriceps while patient performing SAQ's and also QS x 24 minutes (10 sec holds and 10 sec rest) f/b IFC x 20 minutes.  Very good quad contraction demonstrated.                               PT Short Term Goals - 04/06/16 1219      PT SHORT TERM GOAL #1   Title STG's=LTG's.           PT Long Term Goals - 04/12/16 0941      PT LONG TERM GOAL #1   Title Independent with a HEP.   Time 8   Period Weeks   Status On-going     PT LONG TERM GOAL #2   Title Active left knee flexion to 115 degrees+ so the patient can perform functional tasks and do so with pain not > 2-3/10.   Time 8   Period Weeks   Status On-going     PT LONG TERM GOAL #3   Title Increase left knee strength to a solid 4+/5 to provide good stability for accomplishment of functional activities   Time 8  Period Weeks   Status On-going     PT LONG TERM GOAL #4   Title Perform a reciprocating stair gait with one railing with pain not > 2-3/10.   Time 8   Period Weeks   Status On-going     PT LONG TERM GOAL #5   Title Walk in clinic with a straight cane.   Time 8   Period Weeks   Status On-going             Patient will benefit from skilled therapeutic intervention in order to improve the following deficits and impairments:  Pain, Decreased activity tolerance, Decreased range of motion, Increased edema  Visit Diagnosis: Pain in left knee  Stiffness of left knee, not elsewhere classified  Localized edema     Problem List Patient Active Problem List   Diagnosis Date Noted  . Endometrial cancer ([place]) 04/13/2016  . Closed fracture of left distal femur, initial encounter 02/13/2016  . GERD (gastroesophageal reflux disease) 06/30/2015  . Chronic lower back pain 06/18/2014  . Diabetes ([place]) 02/21/2014  . Essential hypertension, benign 02/21/2014  . Unspecified constipation  02/21/2014    Starlyn Droge, [place] MPT 04/16/2016, 1:00 PM  [facility] [place], [place], [address] Phone: [phone]   Fax:  [phone]  Name: Susan Haney MRN: [id number] Date of Birth: 11-27-1931

## 2016-04-19 ENCOUNTER — Encounter: Payer: Self-pay | Admitting: Physical Therapy

## 2016-04-19 ENCOUNTER — Ambulatory Visit: Payer: Medicare Other | Attending: Orthopedic Surgery | Admitting: Physical Therapy

## 2016-04-19 DIAGNOSIS — R600 Localized edema: Secondary | ICD-10-CM | POA: Diagnosis present

## 2016-04-19 DIAGNOSIS — M25562 Pain in left knee: Secondary | ICD-10-CM | POA: Insufficient documentation

## 2016-04-19 DIAGNOSIS — M25662 Stiffness of left knee, not elsewhere classified: Secondary | ICD-10-CM | POA: Diagnosis present

## 2016-04-19 NOTE — Therapy (Signed)
[facility] [place], [place], [address] Phone: [phone]   Fax:  [phone]  Physical Therapy Treatment  Patient Details  Name: Susan Haney MRN: [id number] Date of Birth: [birth year] Referring Provider: Rod Can MD  Encounter Date: 04/19/2016      PT End of Session - 04/19/16 1030    Visit Number 5   Number of Visits 12   Date for PT Re-Evaluation 06/05/16   PT Start Time 0946   PT Stop Time 1044   PT Time Calculation (min) 58 min   Activity Tolerance Patient tolerated treatment well   Behavior During Therapy [facility] for tasks assessed/performed      Past Medical History:  Diagnosis Date  . Anemia    history ,  took iron for years  . Arthritis   . Bicuspid aortic valve   . Cancer [place])    endometrial cancer  . Chronic back pain   . Diabetes ([place])    x 15 yrs with good control  . GERD (gastroesophageal reflux disease)   . Hypertension     Past Surgical History:  Procedure Laterality Date  . BUNIONECTOMY    . COLONOSCOPY N/A 04/04/2014   Procedure: COLONOSCOPY;  Surgeon: Rogene Houston, MD;  [facility];  Service: Endoscopy;  Laterality: N/A;  100  . EYE SURGERY     bil cataracts and lens implants  . FOOT SURGERY     Left  . Ovary removed: benign    . REPLACEMENT TOTAL KNEE BILATERAL     2013  . ROBOTIC ASSISTED TOTAL HYSTERECTOMY WITH BILATERAL SALPINGO OOPHERECTOMY Left 04/13/2016   Procedure: XI ROBOTIC ASSISTED TOTAL HYSTERECTOMY WITH LEFT SALPINGO OOPHORECTOMY SENTINEL LYMPH NODE BIOPSY;  Surgeon: Everitt Amber, MD;  Location[facility];  Service: Gynecology;  Laterality: Left;  . TOTAL KNEE REVISION Left 02/12/2016   Procedure: LEFT TOTAL KNEE REVISION;  Surgeon: Rod Can, MD;  Location[facility];  Service: Orthopedics;  Laterality: Left;    There were no vitals filed for this visit.      Subjective Assessment - 04/19/16 1013    Subjective Patient felt some soreness in knee  after last treatment yet knee feels more stable overall   Pertinent History Previous left total knee replacement.  H/o low back pain with radiculopathy with chronic low back pain.   Patient Stated Goals Get back to a normal activity level.   Currently in Pain? Yes   Pain Score 2    Pain Location Knee   Pain Orientation Left   Pain Descriptors / Indicators Shooting;Aching   Pain Type Surgical pain   Pain Onset More than a month ago   Pain Frequency Intermittent   Aggravating Factors  bending knee and and increased activity   Pain Relieving Factors rest and meds                         [facility] Adult PT Treatment/Exercise - 04/19/16 0001      Knee/Hip Exercises: Supine   Short Arc Quad Sets AROM;Strengthening;Left  3x10   Other Supine Knee/Hip Exercises hip abd with red and green t-band x30 each   Other Supine Knee/Hip Exercises add squeeze with ball 10sec 2x10     Electrical Stimulation   Electrical Stimulation Location left VMO/quad   Electrical Stimulation Action VMS   Electrical Stimulation Parameters 10x10 x59min with Quad set for activation   Printmaker Goals Neuromuscular facilitation;Strength  Vasopneumatic   Number Minutes Vasopneumatic  15 minutes   Vasopnuematic Location  Knee   Vasopneumatic Pressure Medium     Manual Therapy   Manual Therapy Passive ROM   Passive ROM gentle PROM for left knee flexion with low holds                   PT Short Term Goals - 04/06/16 1219      PT SHORT TERM GOAL #1   Title STG's=LTG's.           PT Long Term Goals - 04/19/16 1021      PT LONG TERM GOAL #1   Title Independent with a HEP.   Time 8   Period Weeks   Status On-going     PT LONG TERM GOAL #2   Title Active left knee flexion to 115 degrees+ so the patient can perform functional tasks and do so with pain not > 2-3/10.   Time 8   Period Weeks   Status On-going  AROM 100 degrees 04/19/16     PT LONG TERM GOAL #3    Title Increase left knee strength to a solid 4+/5 to provide good stability for accomplishment of functional activities   Time 8   Period Weeks   Status On-going     PT LONG TERM GOAL #4   Title Perform a reciprocating stair gait with one railing with pain not > 2-3/10.   Time 8   Period Weeks   Status On-going     PT LONG TERM GOAL #5   Title Walk in clinic with a straight cane.   Time 8   Period Weeks   Status On-going               Plan - 04/19/16 1034    Clinical Impression Statement Patient progressing well and tolerated treatment with no increased pain. Patient unable to perform nustep due to her recent surgury at this time. Patient has improved quad control and able to perform SAQ with good control. Patient feels like therapy is helping and her knee has felt some better with reported "less episodes of knee giving way". Patient progressing toward goals yet ongoing due to strength and ROM deficits.   Rehab Potential Excellent   PT Frequency 3x / week   PT Duration 4 weeks   PT Treatment/Interventions ADLs/Self Care Home Management;Cryotherapy;Electrical Stimulation;Therapeutic activities;Therapeutic exercise;Patient/family education;Passive range of motion;Manual techniques;Vasopneumatic Device   PT Next Visit Plan cont with POC per MPT for Total knee protocol.  E'stim and vasopneumatic.  Please perform STW/M to left tib ant and lateral quadriceps. (heel slides, ROM and VMS to VMO/quad with quad sets)   Consulted and Agree with Plan of Care Patient      Patient will benefit from skilled therapeutic intervention in order to improve the following deficits and impairments:  Pain, Decreased activity tolerance, Decreased range of motion, Increased edema  Visit Diagnosis: Left knee pain, unspecified chronicity  Stiffness of left knee, not elsewhere classified  Localized edema     Problem List Patient Active Problem List   Diagnosis Date Noted  . Endometrial cancer  ([place]) 04/13/2016  . Closed fracture of left distal femur, initial encounter 02/13/2016  . GERD (gastroesophageal reflux disease) 06/30/2015  . Chronic lower back pain 06/18/2014  . Diabetes ([place]) 02/21/2014  . Essential hypertension, benign 02/21/2014  . Unspecified constipation 02/21/2014    Noora Locascio P, PTA 04/19/2016, 10:46 AM  [facility] [address]  [place], [place], [address] Phone: [phone]   Fax:  [phone]  Name: Susan Haney MRN: [id number] Date of Birth: 1932/02/26

## 2016-04-20 ENCOUNTER — Other Ambulatory Visit: Payer: Self-pay | Admitting: Gynecologic Oncology

## 2016-04-20 DIAGNOSIS — C541 Malignant neoplasm of endometrium: Secondary | ICD-10-CM

## 2016-04-21 ENCOUNTER — Ambulatory Visit: Payer: Medicare Other | Admitting: Physical Therapy

## 2016-04-21 DIAGNOSIS — M25662 Stiffness of left knee, not elsewhere classified: Secondary | ICD-10-CM

## 2016-04-21 DIAGNOSIS — M25562 Pain in left knee: Secondary | ICD-10-CM

## 2016-04-21 DIAGNOSIS — R600 Localized edema: Secondary | ICD-10-CM

## 2016-04-21 NOTE — Therapy (Signed)
[facility] [place], [place], [address] Phone: [phone]   Fax:  [phone]  Physical Therapy Treatment  Patient Details  Name: Susan Haney MRN: [id number] Date of Birth: 01-02-1932 Referring Provider: Rod Can MD  Encounter Date: 04/21/2016      PT End of Session - 04/21/16 1341    Visit Number 6   Number of Visits 12   Date for PT Re-Evaluation 06/05/16   PT Start Time 0100   PT Stop Time 0149   PT Time Calculation (min) 49 min   Activity Tolerance Patient tolerated treatment well   Behavior During Therapy [facility] for tasks assessed/performed      Past Medical History:  Diagnosis Date  . Anemia    history ,  took iron for years  . Arthritis   . Bicuspid aortic valve   . Cancer [facility])    endometrial cancer  . Chronic back pain   . Diabetes ([place])    x 15 yrs with good control  . GERD (gastroesophageal reflux disease)   . Hypertension     Past Surgical History:  Procedure Laterality Date  . BUNIONECTOMY    . COLONOSCOPY N/A 04/04/2014   Procedure: COLONOSCOPY;  Surgeon: Rogene Houston, MD;  [facility];  Service: Endoscopy;  Laterality: N/A;  100  . EYE SURGERY     bil cataracts and lens implants  . FOOT SURGERY     Left  . Ovary removed: benign    . REPLACEMENT TOTAL KNEE BILATERAL     2013  . ROBOTIC ASSISTED TOTAL HYSTERECTOMY WITH BILATERAL SALPINGO OOPHERECTOMY Left 04/13/2016   Procedure: XI ROBOTIC ASSISTED TOTAL HYSTERECTOMY WITH LEFT SALPINGO OOPHORECTOMY SENTINEL LYMPH NODE BIOPSY;  Surgeon: Everitt Amber, MD;  Location: [facility];  Service: Gynecology;  Laterality: Left;  . TOTAL KNEE REVISION Left 02/12/2016   Procedure: LEFT TOTAL KNEE REVISION;  Surgeon: Rod Can, MD;  Location: [facility];  Service: Orthopedics;  Laterality: Left;    There were no vitals filed for this visit.      Subjective Assessment - 04/21/16 1321    Subjective My knee is feeling more stable.   Patient Stated Goals Get back to a normal activity level.   Pain Score 2    Pain Location Knee   Pain Orientation Left   Pain Descriptors / Indicators Shooting;Aching   Pain Type Surgical pain   Pain Onset More than a month ago   Pain Frequency Intermittent                         [facility] Adult PT Treatment/Exercise - 04/21/16 0001      Exercises   Exercises Knee/Hip     Knee/Hip Exercises: Supine   Quad Sets Limitations QS x 20 minutes faciliated with 4 electrode VMS (10 sec on and 10 sec off).     Vasopneumatic   Number Minutes Vasopneumatic  15 minutes   Vasopnuematic Location  --  Left knee.   Vasopneumatic Pressure Medium                  PT Short Term Goals - 04/06/16 1219      PT SHORT TERM GOAL #1   Title STG's=LTG's.           PT Long Term Goals - 04/19/16 1021      PT LONG TERM GOAL #1   Title Independent with a HEP.   Time 8  Period Weeks   Status On-going     PT LONG TERM GOAL #2   Title Active left knee flexion to 115 degrees+ so the patient can perform functional tasks and do so with pain not > 2-3/10.   Time 8   Period Weeks   Status On-going  AROM 100 degrees 04/19/16     PT LONG TERM GOAL #3   Title Increase left knee strength to a solid 4+/5 to provide good stability for accomplishment of functional activities   Time 8   Period Weeks   Status On-going     PT LONG TERM GOAL #4   Title Perform a reciprocating stair gait with one railing with pain not > 2-3/10.   Time 8   Period Weeks   Status On-going     PT LONG TERM GOAL #5   Title Walk in clinic with a straight cane.   Time 8   Period Weeks   Status On-going             Patient will benefit from skilled therapeutic intervention in order to improve the following deficits and impairments:  Pain, Decreased activity tolerance, Decreased range of motion, Increased edema  Visit Diagnosis: Left knee pain, unspecified chronicity  Stiffness of left  knee, not elsewhere classified  Localized edema     Problem List Patient Active Problem List   Diagnosis Date Noted  . Endometrial cancer ([place]) 04/13/2016  . Closed fracture of left distal femur, initial encounter 02/13/2016  . GERD (gastroesophageal reflux disease) 06/30/2015  . Chronic lower back pain 06/18/2014  . Diabetes ([place]) 02/21/2014  . Essential hypertension, benign 02/21/2014  . Unspecified constipation 02/21/2014    Ariday Brinker, [place] MPT 04/21/2016, 1:50 PM  [facility] [address] [place], [place], [address] Phone: [phone]   Fax:  [phone]  Name: Susan Haney MRN: [id number] Date of Birth: 11-13-1931

## 2016-04-23 ENCOUNTER — Ambulatory Visit: Payer: Medicare Other | Admitting: *Deleted

## 2016-04-23 DIAGNOSIS — M25562 Pain in left knee: Secondary | ICD-10-CM

## 2016-04-23 DIAGNOSIS — M25662 Stiffness of left knee, not elsewhere classified: Secondary | ICD-10-CM

## 2016-04-23 DIAGNOSIS — R600 Localized edema: Secondary | ICD-10-CM

## 2016-04-23 NOTE — Therapy (Signed)
[facility] [place], [place], [address] Phone: [phone]   Fax:  [phone]  Physical Therapy Treatment  Patient Details  Name: Susan Haney MRN: [id number] Date of Birth: 04/26/1932 Referring Provider: Rod Can MD  Encounter Date: 04/23/2016      PT End of Session - 04/23/16 1249    Visit Number 7   Number of Visits 12   Date for PT Re-Evaluation 06/05/16   PT Start Time 0900   PT Stop Time 1000   PT Time Calculation (min) 60 min      Past Medical History:  Diagnosis Date  . Anemia    history ,  took iron for years  . Arthritis   . Bicuspid aortic valve   . Cancer [facility])    endometrial cancer  . Chronic back pain   . Diabetes ([place])    x 15 yrs with good control  . GERD (gastroesophageal reflux disease)   . Hypertension     Past Surgical History:  Procedure Laterality Date  . BUNIONECTOMY    . COLONOSCOPY N/A 04/04/2014   Procedure: COLONOSCOPY;  Surgeon: Rogene Houston, MD;  [facility];  Service: Endoscopy;  Laterality: N/A;  100  . EYE SURGERY     bil cataracts and lens implants  . FOOT SURGERY     Left  . Ovary removed: benign    . REPLACEMENT TOTAL KNEE BILATERAL     2013  . ROBOTIC ASSISTED TOTAL HYSTERECTOMY WITH BILATERAL SALPINGO OOPHERECTOMY Left 04/13/2016   Procedure: XI ROBOTIC ASSISTED TOTAL HYSTERECTOMY WITH LEFT SALPINGO OOPHORECTOMY SENTINEL LYMPH NODE BIOPSY;  Surgeon: Everitt Amber, MD;  [facility];  Service: Gynecology;  Laterality: Left;  . TOTAL KNEE REVISION Left 02/12/2016   Procedure: LEFT TOTAL KNEE REVISION;  Surgeon: Rod Can, MD;  [facility];  Service: Orthopedics;  Laterality: Left;    There were no vitals filed for this visit.      Subjective Assessment - 04/23/16 0859    Subjective My knee is feeling more stable.   Pertinent History Previous left total knee replacement.  H/o low back pain with radiculopathy with chronic low back  pain.   Patient Stated Goals Get back to a normal activity level.   Currently in Pain? Yes   Pain Score 2    Pain Location Knee   Pain Orientation Left   Pain Descriptors / Indicators Aching;Shooting   Pain Onset More than a month ago   Pain Frequency Intermittent                         [facility] Adult PT Treatment/Exercise - 04/23/16 0001      Knee/Hip Exercises: Standing   Other Standing Knee Exercises Standing mini- squats in walker 2x10, and rock backs onto LT LE to facilitate WB quad contraction     Knee/Hip Exercises: Seated   Long Arc Quad Strengthening;Left;4 sets;10 reps   Long Arc Quad Weight 2 lbs.   Sit to [employer] 3 sets;10 reps     Acupuncturist Location left VMO/quad  VMS x 10 min  10 sec  on/off  with SAQ 2#   Electrical Stimulation Goals Neuromuscular facilitation;Strength     Vasopneumatic   Number Minutes Vasopneumatic  15 minutes   Vasopnuematic Location  Knee   Vasopneumatic Pressure Medium   Vasopneumatic Temperature  36  PT Short Term Goals - 04/06/16 1219      PT SHORT TERM GOAL #1   Title STG's=LTG's.           PT Long Term Goals - 04/19/16 1021      PT LONG TERM GOAL #1   Title Independent with a HEP.   Time 8   Period Weeks   Status On-going     PT LONG TERM GOAL #2   Title Active left knee flexion to 115 degrees+ so the patient can perform functional tasks and do so with pain not > 2-3/10.   Time 8   Period Weeks   Status On-going  AROM 100 degrees 04/19/16     PT LONG TERM GOAL #3   Title Increase left knee strength to a solid 4+/5 to provide good stability for accomplishment of functional activities   Time 8   Period Weeks   Status On-going     PT LONG TERM GOAL #4   Title Perform a reciprocating stair gait with one railing with pain not > 2-3/10.   Time 8   Period Weeks   Status On-going     PT LONG TERM GOAL #5   Title Walk in clinic with a  straight cane.   Time 8   Period Weeks   Status On-going               Plan - 04/23/16 0900    Clinical Impression Statement Pt did fairly well  with Rx today and was able to perform full active extension with LAQs and SAQs. Her quad activation and control has improved, but is still weak. We practiced open and closed chain exs to help facilitate quad activation during WB.  She still ambulates with LT knee in extension due to quad weakness and fear of falling.  I advised Pt to continue to use FWW for ambulation until quad control improves. Goals are ongoing   Rehab Potential Excellent   PT Frequency 3x / week   PT Duration 4 weeks   PT Treatment/Interventions ADLs/Self Care Home Management;Cryotherapy;Electrical Stimulation;Therapeutic activities;Therapeutic exercise;Patient/family education;Passive range of motion;Manual techniques;Vasopneumatic Device   PT Next Visit Plan cont with POC per MPT for Total knee protocol.  E'stim and vasopneumatic.  Please perform STW/M to left tib ant and lateral quadriceps. (heel slides, ROM and VMS to VMO/quad with quad sets)   Consulted and Agree with Plan of Care Patient      Patient will benefit from skilled therapeutic intervention in order to improve the following deficits and impairments:  Pain, Decreased activity tolerance, Decreased range of motion, Increased edema  Visit Diagnosis: Left knee pain, unspecified chronicity  Stiffness of left knee, not elsewhere classified  Localized edema     Problem List Patient Active Problem List   Diagnosis Date Noted  . Endometrial cancer ([place]) 04/13/2016  . Closed fracture of left distal femur, initial encounter 02/13/2016  . GERD (gastroesophageal reflux disease) 06/30/2015  . Chronic lower back pain 06/18/2014  . Diabetes ([place]) 02/21/2014  . Essential hypertension, benign 02/21/2014  . Unspecified constipation 02/21/2014    Trayquan Kolakowski,CHRIS, PTA 04/23/2016, 12:58 PM  [facility] [address] [place], [place], [address] Phone: [phone]   Fax:  [phone]  Name: Susan Haney MRN: [id number] Date of Birth: 1931-12-22

## 2016-04-26 ENCOUNTER — Encounter: Payer: Self-pay | Admitting: Physical Therapy

## 2016-04-26 ENCOUNTER — Ambulatory Visit: Payer: Medicare Other | Admitting: Physical Therapy

## 2016-04-26 DIAGNOSIS — R600 Localized edema: Secondary | ICD-10-CM

## 2016-04-26 DIAGNOSIS — M25562 Pain in left knee: Secondary | ICD-10-CM | POA: Diagnosis not present

## 2016-04-26 DIAGNOSIS — M25662 Stiffness of left knee, not elsewhere classified: Secondary | ICD-10-CM

## 2016-04-26 NOTE — Therapy (Signed)
[facility] [place], [place], [address] Phone: [phone]   Fax:  [phone]  Physical Therapy Treatment  Patient Details  Name: Susan Haney MRN: [id number] Date of Birth: Apr 16, 1932 Referring Provider: Rod Can MD  Encounter Date: 04/26/2016      PT End of Session - 04/26/16 0905    Visit Number 8   Number of Visits 12   Date for PT Re-Evaluation 06/05/16   PT Start Time 0903   PT Stop Time 0953   PT Time Calculation (min) 50 min   Activity Tolerance Patient tolerated treatment well   Behavior During Therapy [facility] for tasks assessed/performed      Past Medical History:  Diagnosis Date  . Anemia    history ,  took iron for years  . Arthritis   . Bicuspid aortic valve   . Cancer [facility])    endometrial cancer  . Chronic back pain   . Diabetes ([place])    x 15 yrs with good control  . GERD (gastroesophageal reflux disease)   . Hypertension     Past Surgical History:  Procedure Laterality Date  . BUNIONECTOMY    . COLONOSCOPY N/A 04/04/2014   Procedure: COLONOSCOPY;  Surgeon: Rogene Houston, MD;  [facility];  Service: Endoscopy;  Laterality: N/A;  100  . EYE SURGERY     bil cataracts and lens implants  . FOOT SURGERY     Left  . Ovary removed: benign    . REPLACEMENT TOTAL KNEE BILATERAL     2013  . ROBOTIC ASSISTED TOTAL HYSTERECTOMY WITH BILATERAL SALPINGO OOPHERECTOMY Left 04/13/2016   Procedure: XI ROBOTIC ASSISTED TOTAL HYSTERECTOMY WITH LEFT SALPINGO OOPHORECTOMY SENTINEL LYMPH NODE BIOPSY;  Surgeon: Everitt Amber, MD;  [facility];  Service: Gynecology;  Laterality: Left;  . TOTAL KNEE REVISION Left 02/12/2016   Procedure: LEFT TOTAL KNEE REVISION;  Surgeon: Rod Can, MD;  [facility];  Service: Orthopedics;  Laterality: Left;    There were no vitals filed for this visit.      Subjective Assessment - 04/26/16 0903    Subjective Reports that she has hurt her back  over the weekend cleaning a water leak in her kitchen and made her knee very sore. Reports that her pain is mostly in R hip. Reports that the last treatment last week has helped some with L knee giving way. Reports that she did a few exercises yesterday but unable to complete saturday secondary to pain.   Pertinent History Previous left total knee replacement.  H/o low back pain with radiculopathy with chronic low back pain.   Patient Stated Goals Get back to a normal activity level.   Currently in Pain? Yes   Pain Score 8    Pain Location Back   Pain Orientation Right;Lower   Pain Descriptors / Indicators Aching;Sore   Pain Type Surgical pain   Pain Radiating Towards R hip   Pain Onset More than a month ago            [facility] PT Assessment - 04/26/16 0001      Assessment   Medical Diagnosis Periprosthetic fracture of left knee.   Onset Date/Surgical Date 02/11/16   Next MD Visit 05/11/2016     Restrictions   Weight Bearing Restrictions No                     [facility] Adult PT Treatment/Exercise - 04/26/16 0001  Knee/Hip Exercises: Standing   Hip Abduction AROM;Left;2 sets;10 reps;Knee straight   Other Standing Knee Exercises Tandem rocking 2x10 reps   Other Standing Knee Exercises Mini squat 2x10 reps     Knee/Hip Exercises: Seated   Long Arc Quad Strengthening;Left;10 reps;2 sets;Weights   Long Arc Quad Weight 3 lbs.   Sit to [employer] 2 sets;10 reps;with UE support     Modalities   Modalities Passenger transport manager Location L VMO/ Quad with 2# weight   Electrical Stimulation Action VMS   Electrical Stimulation Parameters 10/10, phase duration 300 usec, frequency 50 hz x15 min   Electrical Stimulation Goals Neuromuscular facilitation     Vasopneumatic   Number Minutes Vasopneumatic  15 minutes   Vasopnuematic Location  Knee   Vasopneumatic Pressure Medium   Vasopneumatic Temperature  71                   PT Short Term Goals - 04/06/16 1219      PT SHORT TERM GOAL #1   Title STG's=LTG's.           PT Long Term Goals - 04/19/16 1021      PT LONG TERM GOAL #1   Title Independent with a HEP.   Time 8   Period Weeks   Status On-going     PT LONG TERM GOAL #2   Title Active left knee flexion to 115 degrees+ so the patient can perform functional tasks and do so with pain not > 2-3/10.   Time 8   Period Weeks   Status On-going  AROM 100 degrees 04/19/16     PT LONG TERM GOAL #3   Title Increase left knee strength to a solid 4+/5 to provide good stability for accomplishment of functional activities   Time 8   Period Weeks   Status On-going     PT LONG TERM GOAL #4   Title Perform a reciprocating stair gait with one railing with pain not > 2-3/10.   Time 8   Period Weeks   Status On-going     PT LONG TERM GOAL #5   Title Walk in clinic with a straight cane.   Time 8   Period Weeks   Status On-going               Plan - 04/26/16 0950    Clinical Impression Statement Patient arrived to treatment today with low back pain and into R hip and continues to utilize FWW for ambulation. L Quad contraction exercises were continued today with no increased pain reports by patient. Normal response with VMS to L VMO/ Quad with 2# weights although patient experienced discomfort in inferior knee in late stages of VMS. Normal modalities response noted following removal of the modalities.   Rehab Potential Excellent   PT Frequency 3x / week   PT Duration 4 weeks   PT Treatment/Interventions ADLs/Self Care Home Management;Cryotherapy;Electrical Stimulation;Therapeutic activities;Therapeutic exercise;Patient/family education;Passive range of motion;Manual techniques;Vasopneumatic Device   PT Next Visit Plan cont with POC per MPT for Total knee protocol.  E'stim and vasopneumatic.  Please perform STW/M to left tib ant and lateral quadriceps. (heel slides, ROM and  VMS to VMO/quad with quad sets)   Consulted and Agree with Plan of Care Patient      Patient will benefit from skilled therapeutic intervention in order to improve the following deficits and impairments:  Pain, Decreased activity tolerance, Decreased range of motion,  Increased edema  Visit Diagnosis: Left knee pain, unspecified chronicity  Stiffness of left knee, not elsewhere classified  Localized edema     Problem List Patient Active Problem List   Diagnosis Date Noted  . Endometrial cancer ([place]) 04/13/2016  . Closed fracture of left distal femur, initial encounter 02/13/2016  . GERD (gastroesophageal reflux disease) 06/30/2015  . Chronic lower back pain 06/18/2014  . Diabetes ([place]) 02/21/2014  . Essential hypertension, benign 02/21/2014  . Unspecified constipation 02/21/2014    Wynelle Fanny, PTA 04/26/2016, 9:59 AM  [facility] [address] [place], [place], [address] Phone: [phone]   Fax:  [phone]  Name: Susan Haney MRN: [id number] Date of Birth: 12-11-1931

## 2016-04-27 ENCOUNTER — Telehealth: Payer: Self-pay | Admitting: Gynecologic Oncology

## 2016-04-27 DIAGNOSIS — C541 Malignant neoplasm of endometrium: Secondary | ICD-10-CM

## 2016-04-27 NOTE — Telephone Encounter (Signed)
Attempted to call patient to see how she was doing post-op and to discuss MSI testing results on her path.  Left message asking her to please call the office.

## 2016-04-27 NOTE — Telephone Encounter (Signed)
Patient doing well post-op.  Reporting mild constipation after surgery but it is resolving.  Discussed final path and recommendation for radiation.  Discussed MSI results and recommendation to meet with the genetics counselor.  All questions answered.  Advised to call for any questions or concerns.   

## 2016-04-28 ENCOUNTER — Encounter: Payer: Self-pay | Admitting: Physical Therapy

## 2016-04-28 ENCOUNTER — Ambulatory Visit: Payer: Medicare Other | Admitting: Physical Therapy

## 2016-04-28 DIAGNOSIS — R600 Localized edema: Secondary | ICD-10-CM

## 2016-04-28 DIAGNOSIS — M25562 Pain in left knee: Secondary | ICD-10-CM

## 2016-04-28 DIAGNOSIS — M25662 Stiffness of left knee, not elsewhere classified: Secondary | ICD-10-CM

## 2016-04-28 NOTE — Patient Instructions (Addendum)
  Strengthening: Terminal Knee Extension (Supine)   With right knee over bolster, straighten knee by tightening muscles on top of thigh. Keep bottom of knee on bolster. Repeat _10___ times per set. Do ___2-3_ sets per session. Do _2-3___ sessions per day.   Knee Extension (Sitting)   Place __0-3__ pound weight on left ankle and straighten knee fully, lower slowly. Repeat _10___ times per set. Do __2-3__ sets per session. Do __2-3__ sessions per day.    Knee Extension (Sitting)   Place __0-3__ pound weight on left ankle and straighten knee fully, lower slowly. Repeat _10___ times per set. Do __2-3__ sets per session. Do __2-3__ sessions per day.  Strengthening: Hip Abduction (Side-Lying)  Strengthening: Straight Leg Raise (Phase 1)  Repeat _10___ times per set. Do __2__ sets per session. Do __2__ sessions per day.   Straight Leg Raise   Tighten stomach and slowly raise locked right leg __4__ inches from floor. Repeat __10-30__ times per set. Do __2__ sets per session. Do __2__ sessions per day.      Strengthening: Hip Abductor - Resisted    With band looped around both legs above knees, push left thighs apart. Repeat _30___ times per set. Do _1_ sets per session. Do 1-3____ sessions per day.   Functional Quadriceps: Chair Squat    Keeping feet flat on floor, shoulder width apart, weight shift over to left knee with knee slightly bent and practice putting weight on left knee Repeat __10__ times per set. Do _1-2___ sets per session. Do _1-2___ sessions per day.

## 2016-04-28 NOTE — Therapy (Signed)
[facility] [place], [place], [address] Phone: [phone]   Fax:  [phone]  Physical Therapy Treatment  Patient Details  Name: Susan Haney MRN: [id number] Date of Birth: 1932-07-09 Referring Provider: Rod Can MD  Encounter Date: 04/28/2016      PT End of Session - 04/28/16 0950    Visit Number 9   Number of Visits 12   Date for PT Re-Evaluation 06/05/16   PT Start Time 0900   PT Stop Time 0956   PT Time Calculation (min) 56 min   Activity Tolerance Patient tolerated treatment well   Behavior During Therapy [facility] for tasks assessed/performed      Past Medical History:  Diagnosis Date  . Anemia    history ,  took iron for years  . Arthritis   . Bicuspid aortic valve   . Cancer [facility])    endometrial cancer  . Chronic back pain   . Diabetes ([place])    x 15 yrs with good control  . GERD (gastroesophageal reflux disease)   . Hypertension     Past Surgical History:  Procedure Laterality Date  . BUNIONECTOMY    . COLONOSCOPY N/A 04/04/2014   Procedure: COLONOSCOPY;  Surgeon: Rogene Houston, MD;  [facility];  Service: Endoscopy;  Laterality: N/A;  100  . EYE SURGERY     bil cataracts and lens implants  . FOOT SURGERY     Left  . Ovary removed: benign    . REPLACEMENT TOTAL KNEE BILATERAL     2013  . ROBOTIC ASSISTED TOTAL HYSTERECTOMY WITH BILATERAL SALPINGO OOPHERECTOMY Left 04/13/2016   Procedure: XI ROBOTIC ASSISTED TOTAL HYSTERECTOMY WITH LEFT SALPINGO OOPHORECTOMY SENTINEL LYMPH NODE BIOPSY;  Surgeon: Everitt Amber, MD;  [facility]: [facility];  Service: Gynecology;  Laterality: Left;  . TOTAL KNEE REVISION Left 02/12/2016   Procedure: LEFT TOTAL KNEE REVISION;  Surgeon: Rod Can, MD;  [facility];  Service: Orthopedics;  Laterality: Left;    There were no vitals filed for this visit.      Subjective Assessment - 04/28/16 0912    Subjective Patient has reported improvement  overall and feels like knee is getting stronger slowly   Pertinent History Previous left total knee replacement.  H/o low back pain with radiculopathy with chronic low back pain.   Patient Stated Goals Get back to a normal activity level.   Currently in Pain? Yes   Pain Score 3    Pain Location Knee   Pain Orientation Left   Pain Descriptors / Indicators Sore   Pain Type Surgical pain   Pain Onset More than a month ago   Pain Frequency Intermittent   Aggravating Factors  bending knee and prolong activity   Pain Relieving Factors rest and meds                         [facility] Adult PT Treatment/Exercise - 04/28/16 0001      Knee/Hip Exercises: Standing   Other Standing Knee Exercises SLS on left x 20mn     Knee/Hip Exercises: Seated   Long Arc Quad Strengthening;Left;10 reps;2 sets  with ball squeeze for VMO activation 3x10   Knee/Hip Flexion x20   Abduction/Adduction  Strengthening;Left;20 reps  with green tband   Sit to Sand 2 sets;10 reps;with UE support  used Lt LE slight back, Rt slight forward/Rt UE support     Knee/Hip Exercises: Supine   Straight  Leg Raise with External Rotation AROM;Strengthening;Left;2 sets;10 reps     Electrical Stimulation   Electrical Stimulation Location left quad/VMO   Electrical Stimulation Action VMS   Electrical Stimulation Parameters 10x10 x28mn with 2# SAQ for activation   Electrical Stimulation Goals Neuromuscular facilitation     Vasopneumatic   Number Minutes Vasopneumatic  15 minutes   Vasopnuematic Location  Knee   Vasopneumatic Pressure Medium                PT Education - 04/28/16 0950    Education provided Yes   Education Details HEP   Person(s) Educated Patient   Methods Explanation;Demonstration;Handout   Comprehension Verbalized understanding;Returned demonstration          PT Short Term Goals - 04/06/16 1219      PT SHORT TERM GOAL #1   Title STG's=LTG's.           PT Long Term  Goals - 04/28/16 0959      PT LONG TERM GOAL #1   Title Independent with a HEP.   Period Weeks   Status Achieved     PT LONG TERM GOAL #2   Title Active left knee flexion to 115 degrees+ so the patient can perform functional tasks and do so with pain not > 2-3/10.   Time 8   Period Weeks   Status On-going     PT LONG TERM GOAL #3   Title Increase left knee strength to a solid 4+/5 to provide good stability for accomplishment of functional activities   Time 8   Period Weeks   Status On-going     PT LONG TERM GOAL #4   Title Perform a reciprocating stair gait with one railing with pain not > 2-3/10.   Time 8   Period Weeks   Status On-going     PT LONG TERM GOAL #5   Title Walk in clinic with a straight cane.   Time 8   Period Weeks   Status On-going               Plan - 04/28/16 0951    Clinical Impression Statement Patient progressing well today and tolerated treatment with no pain. Patient has improved quad control and able to perform all supine exercises with good control. Patient has reported less episodes of knee buckling and a decreased hyper ext of knee. HEP given to patient today with green t-band, as she will be out of town all next week. Patient met LTG #1 other goals ongoing due to strength and ROM deficits.   Rehab Potential Excellent   PT Frequency 3x / week   PT Duration 4 weeks   PT Treatment/Interventions ADLs/Self Care Home Management;Cryotherapy;Electrical Stimulation;Therapeutic activities;Therapeutic exercise;Patient/family education;Passive range of motion;Manual techniques;Vasopneumatic Device   PT Next Visit Plan cont with POC for strengthening progression (MD appt 05/11/16)   Consulted and Agree with Plan of Care Patient      Patient will benefit from skilled therapeutic intervention in order to improve the following deficits and impairments:  Pain, Decreased activity tolerance, Decreased range of motion, Increased edema  Visit  Diagnosis: Left knee pain, unspecified chronicity  Stiffness of left knee, not elsewhere classified  Localized edema     Problem List Patient Active Problem List   Diagnosis Date Noted  . Endometrial cancer ([place] 04/13/2016  . Closed fracture of left distal femur, initial encounter 02/13/2016  . GERD (gastroesophageal reflux disease) 06/30/2015  . Chronic lower back pain 06/18/2014  . Diabetes (  [place]) 02/21/2014  . Essential hypertension, benign 02/21/2014  . Unspecified constipation 02/21/2014    Mihir Flanigan P, PTA 04/28/2016, 10:01 AM  [facility] [place], [place], [address] Phone: [phone]   Fax:  [phone]  Name: Susan Haney MRN: [id number] Date of Birth: Jul 04, 1932

## 2016-04-29 ENCOUNTER — Encounter: Payer: Self-pay | Admitting: Genetic Counselor

## 2016-04-29 ENCOUNTER — Telehealth: Payer: Self-pay | Admitting: Genetic Counselor

## 2016-04-29 NOTE — Telephone Encounter (Signed)
Appt schedule w/Kayla Boggs on 11/16@10am . Pt aware to arrive 30 minutes early. Demographics verified. Letter mailed.

## 2016-04-30 ENCOUNTER — Ambulatory Visit: Payer: Medicare Other | Admitting: *Deleted

## 2016-04-30 DIAGNOSIS — M25562 Pain in left knee: Secondary | ICD-10-CM

## 2016-04-30 DIAGNOSIS — R600 Localized edema: Secondary | ICD-10-CM

## 2016-04-30 DIAGNOSIS — M25662 Stiffness of left knee, not elsewhere classified: Secondary | ICD-10-CM

## 2016-04-30 NOTE — Therapy (Signed)
[facility] [place], [place], [address] Phone: [phone]   Fax:  [phone]  Physical Therapy Treatment  Patient Details  Name: Susan Haney MRN: [id number] Date of Birth: 15-Nov-1931 Referring Provider: Rod Can MD  Encounter Date: 04/30/2016      PT End of Session - 04/30/16 0938    Visit Number 10   Number of Visits 12   Date for PT Re-Evaluation 06/05/16   PT Start Time 0900   PT Stop Time 1000   PT Time Calculation (min) 60 min      Past Medical History:  Diagnosis Date  . Anemia    history ,  took iron for years  . Arthritis   . Bicuspid aortic valve   . Cancer [facility])    endometrial cancer  . Chronic back pain   . Diabetes ([place])    x 15 yrs with good control  . GERD (gastroesophageal reflux disease)   . Hypertension     Past Surgical History:  Procedure Laterality Date  . BUNIONECTOMY    . COLONOSCOPY N/A 04/04/2014   Procedure: COLONOSCOPY;  Surgeon: Rogene Houston, MD;  [facility];  Service: Endoscopy;  Laterality: N/A;  100  . EYE SURGERY     bil cataracts and lens implants  . FOOT SURGERY     Left  . Ovary removed: benign    . REPLACEMENT TOTAL KNEE BILATERAL     2013  . ROBOTIC ASSISTED TOTAL HYSTERECTOMY WITH BILATERAL SALPINGO OOPHERECTOMY Left 04/13/2016   Procedure: XI ROBOTIC ASSISTED TOTAL HYSTERECTOMY WITH LEFT SALPINGO OOPHORECTOMY SENTINEL LYMPH NODE BIOPSY;  Surgeon: Everitt Amber, MD;  [facility];  Service: Gynecology;  Laterality: Left;  . TOTAL KNEE REVISION Left 02/12/2016   Procedure: LEFT TOTAL KNEE REVISION;  Surgeon: Rod Can, MD;  [facility];  Service: Orthopedics;  Laterality: Left;    There were no vitals filed for this visit.      Subjective Assessment - 04/30/16 0910    Subjective Patient has reported improvement overall and feels like knee is getting stronger slowly. Called MD about incision scab draining and he said it should  be ok   Pertinent History Previous left total knee replacement.  H/o low back pain with radiculopathy with chronic low back pain.   Patient Stated Goals Get back to a normal activity level.   Currently in Pain? Yes   Pain Score 3    Pain Location Knee   Pain Orientation Left   Pain Descriptors / Indicators Sore   Pain Onset More than a month ago   Pain Frequency Intermittent                         [facility] Adult PT Treatment/Exercise - 04/30/16 0001      Knee/Hip Exercises: Standing   Other Standing Knee Exercises TKEs 3x10 yellow band standing at walker   Other Standing Knee Exercises Mini squat 3x10 reps     Knee/Hip Exercises: Seated   Long Arc Quad Strengthening;Left;4 sets;10 reps   Long Arc Quad Weight 2 lbs.   Sit to Sand --  used Lt LE slight back, Rt slight forward/Rt UE support     Modalities   Modalities Location manager Stimulation Location left VMO/quad  VMS x 10 min  10 sec  on/off  with SAQ 2#   Chartered certified accountant Premod with VASO  at 1-10hz  x 15 mins   Electrical Stimulation Goals Neuromuscular facilitation     Vasopneumatic   Number Minutes Vasopneumatic  15 minutes   Vasopnuematic Location  Knee   Vasopneumatic Pressure Medium   Vasopneumatic Temperature  36                  PT Short Term Goals - 04/06/16 1219      PT SHORT TERM GOAL #1   Title STG's=LTG's.           PT Long Term Goals - 04/28/16 0959      PT LONG TERM GOAL #1   Title Independent with a HEP.   Period Weeks   Status Achieved     PT LONG TERM GOAL #2   Title Active left knee flexion to 115 degrees+ so the patient can perform functional tasks and do so with pain not > 2-3/10.   Time 8   Period Weeks   Status On-going     PT LONG TERM GOAL #3   Title Increase left knee strength to a solid 4+/5 to provide good stability for accomplishment of functional activities   Time 8    Period Weeks   Status On-going     PT LONG TERM GOAL #4   Title Perform a reciprocating stair gait with one railing with pain not > 2-3/10.   Time 8   Period Weeks   Status On-going     PT LONG TERM GOAL #5   Title Walk in clinic with a straight cane.   Time 8   Period Weeks   Status On-going               Plan - 2016-05-12 1216    Clinical Impression Statement Pt did great today and was able to progress with Quad activation and strengthening Exs. She had more quad control today and could prevent her knee from hyper-extending, but still ambulates with knee locked out. LTGs are ongoing. No extension Lag .    Rehab Potential Excellent   PT Frequency 3x / week   PT Duration 4 weeks   PT Treatment/Interventions ADLs/Self Care Home Management;Cryotherapy;Electrical Stimulation;Therapeutic activities;Therapeutic exercise;Patient/family education;Passive range of motion;Manual techniques;Vasopneumatic Device   PT Next Visit Plan cont with POC for strengthening progression (MD appt 05/11/16)   Consulted and Agree with Plan of Care Patient      Patient will benefit from skilled therapeutic intervention in order to improve the following deficits and impairments:  Pain, Decreased activity tolerance, Decreased range of motion, Increased edema  Visit Diagnosis: Left knee pain, unspecified chronicity  Stiffness of left knee, not elsewhere classified  Localized edema       G-Codes - 2016/05/12 1223    Functional Assessment Tool Used 10th visit FOTO 57% limited      Problem List Patient Active Problem List   Diagnosis Date Noted  . Endometrial cancer ([place]) 04/13/2016  . Closed fracture of left distal femur, initial encounter 02/13/2016  . GERD (gastroesophageal reflux disease) 06/30/2015  . Chronic lower back pain 06/18/2014  . Diabetes ([place]) 02/21/2014  . Essential hypertension, benign 02/21/2014  . Unspecified constipation 02/21/2014    Surie Suchocki,CHRIS, PTA May 12, 2016,  12:24 PM  [facility] [address] [place], [place], [address] Phone: [phone]   Fax:  [phone]  Name: KASSIDI DECASAS MRN: [id number] Date of Birth: 1931/12/28

## 2016-05-05 ENCOUNTER — Ambulatory Visit: Payer: Medicare Other | Admitting: Gynecologic Oncology

## 2016-05-06 NOTE — Progress Notes (Signed)
GYN Location of Tumor / Histology: Endometrial adenocarcinoma   Briscoe Burns presented with symptoms of: postmenopausal bleeding   Biopsies revealed:   04/13/16 Diagnosis 1. Lymph node, sentinel, biopsy, right external iliac ONE BENIGN LYMPH NODE (0/1) 2. Lymph node, sentinel, biopsy, right obturator ONE BENIGN LYMPH NODE (0/1) 3. Lymph node, sentinel, biopsy, left obturator ONE BENIGN LYMPH NODE (0/1) 4. Uterus and cervix, right fallopian tube and ovary ENDOMETRIOID CARCINOMA (4.3 CM), FIGO GRADE 2 THE TUMOR INVADES ONE HALF OF THE MYOMETRIUM (PT1B) ALL MARGINS OF RESECTION ARE NEGATIVE FOR CARCINOMA LEIOMYOMAS UNREMARKABLE RIGHT OVARY AND FALLOPIAN TUBE  Past/Anticipated interventions by Gyn/Onc surgery, if any: 04/13/16 - Procedure: XI ROBOTIC ASSISTED TOTAL HYSTERECTOMY WITH LEFT SALPINGO OOPHORECTOMY SENTINEL LYMPH NODE BIOPSY;  Surgeon: Everitt Amber, MD  Past/Anticipated interventions by medical oncology, if any:   Weight changes, if any:   Bowel/Bladder complaints, if any: ,   Nausea/Vomiting, if any:   Pain issues, if any:    SAFETY ISSUES:  Prior radiation?   Pacemaker/ICD?   Possible current pregnancy? no  Is the patient on methotrexate?   Current Complaints / other details:

## 2016-05-10 ENCOUNTER — Ambulatory Visit: Payer: Medicare Other | Attending: Gynecologic Oncology | Admitting: Gynecologic Oncology

## 2016-05-10 ENCOUNTER — Encounter: Payer: Self-pay | Admitting: Physical Therapy

## 2016-05-10 ENCOUNTER — Ambulatory Visit: Payer: Medicare Other | Admitting: Physical Therapy

## 2016-05-10 ENCOUNTER — Ambulatory Visit
Admission: RE | Admit: 2016-05-10 | Discharge: 2016-05-10 | Disposition: A | Discharge status: Home / Self Care | Payer: Medicare Other | Source: Ambulatory Visit | Attending: Radiation Oncology | Admitting: Radiation Oncology

## 2016-05-10 ENCOUNTER — Ambulatory Visit: Payer: Medicare Other

## 2016-05-10 ENCOUNTER — Encounter: Payer: Self-pay | Admitting: Gynecologic Oncology

## 2016-05-10 DIAGNOSIS — M1990 Unspecified osteoarthritis, unspecified site: Secondary | ICD-10-CM | POA: Insufficient documentation

## 2016-05-10 DIAGNOSIS — C541 Malignant neoplasm of endometrium: Secondary | ICD-10-CM | POA: Insufficient documentation

## 2016-05-10 DIAGNOSIS — Z7189 Other specified counseling: Secondary | ICD-10-CM | POA: Diagnosis not present

## 2016-05-10 DIAGNOSIS — M25562 Pain in left knee: Secondary | ICD-10-CM

## 2016-05-10 DIAGNOSIS — Z7984 Long term (current) use of oral hypoglycemic drugs: Secondary | ICD-10-CM | POA: Insufficient documentation

## 2016-05-10 DIAGNOSIS — Z90722 Acquired absence of ovaries, bilateral: Secondary | ICD-10-CM | POA: Insufficient documentation

## 2016-05-10 DIAGNOSIS — Z833 Family history of diabetes mellitus: Secondary | ICD-10-CM | POA: Diagnosis not present

## 2016-05-10 DIAGNOSIS — Q231 Congenital insufficiency of aortic valve: Secondary | ICD-10-CM | POA: Insufficient documentation

## 2016-05-10 DIAGNOSIS — Z510 Encounter for antineoplastic radiation therapy: Secondary | ICD-10-CM | POA: Insufficient documentation

## 2016-05-10 DIAGNOSIS — I10 Essential (primary) hypertension: Secondary | ICD-10-CM | POA: Insufficient documentation

## 2016-05-10 DIAGNOSIS — N950 Postmenopausal bleeding: Secondary | ICD-10-CM | POA: Insufficient documentation

## 2016-05-10 DIAGNOSIS — M545 Low back pain: Secondary | ICD-10-CM | POA: Insufficient documentation

## 2016-05-10 DIAGNOSIS — Z90710 Acquired absence of both cervix and uterus: Secondary | ICD-10-CM | POA: Diagnosis not present

## 2016-05-10 DIAGNOSIS — Z9079 Acquired absence of other genital organ(s): Secondary | ICD-10-CM | POA: Insufficient documentation

## 2016-05-10 DIAGNOSIS — R600 Localized edema: Secondary | ICD-10-CM

## 2016-05-10 DIAGNOSIS — E669 Obesity, unspecified: Secondary | ICD-10-CM | POA: Insufficient documentation

## 2016-05-10 DIAGNOSIS — M25662 Stiffness of left knee, not elsewhere classified: Secondary | ICD-10-CM

## 2016-05-10 DIAGNOSIS — Z96653 Presence of artificial knee joint, bilateral: Secondary | ICD-10-CM | POA: Insufficient documentation

## 2016-05-10 DIAGNOSIS — E119 Type 2 diabetes mellitus without complications: Secondary | ICD-10-CM | POA: Diagnosis not present

## 2016-05-10 DIAGNOSIS — K219 Gastro-esophageal reflux disease without esophagitis: Secondary | ICD-10-CM | POA: Diagnosis not present

## 2016-05-10 DIAGNOSIS — G8929 Other chronic pain: Secondary | ICD-10-CM | POA: Insufficient documentation

## 2016-05-10 NOTE — Progress Notes (Signed)
Follow-up Note: Endometrial cancer   Susan Haney 82 y.o. female  Chief Complaint  Patient presents with  . endometrial cancer    Follow up     Assessment : 82 year old woman with stage IB grade 2 endometrioid endometrial adenocarcinoma with high/intermediate risk factors and Microsatellite instability on routine tumor testing.  Plan:  High/intermediate risk factors for recurrence. Recommendation is for vaginal brachytherapy to reduce risk for local recurrence in accordance with NCCN guidelines.  We will have her meet with genetics to discuss testing for Lynch syndrome.  I discussed this with the patient. I discussed the role of adjuvant therapy. I discussed prognosis and risk for recurrence. We reviewed symptoms concerning for recurrence and she will see me if these develop prior to her scheduled appointment.  After completing adjuvant therapy I recommend she follow-up at 3 monthly intervals for symptom review, physical examination and pelvic examination. Pap smear is not recommended in routine endometrial cancer surveillance. After 2 years we will space these visits to every 6 months, and then annually if recurrence has not developed within 5 years. All questions were answered.   HPI: The patient had onset of postmenopausal bleeding approximately a month ago. This was not associated with any pain or other symptoms. An endometrial biopsy was obtained showing a grade 1-2 endometrial adenocarcinoma. Pelvic ultrasound showed the uterus to measure 7 x 4.4 x 5.6 cm with 2 small fibroids. The left ovary surgically absent right ovary appeared normal there is no free fluid.  Today patient is having some vaginal bleeding. She also notes some increased edema and pain in her left lower extremity below the knee.  The patient has a past history of a benign left ovarian cyst which was removed laparoscopically. The patient underwent menopause at age 74. Pap smears are normal  Obstetrical history  gravida 2 para 2  Comorbidities include diabetes valvular heart disease osteoporosis lumbar disc disease and obesity.  Interval Hx: Her original surgery date had to be postponed when she acutely fell and fractured her left distal femur assoicated with a knee replacement on 02/12/16. She required emergent repair and replacement and had been in rehab since that time.   On 04/13/16 we performed robotic assisted total hysterectomy, BSO, bilateral SLN biopsy. Final pathology revealed a 4.3cm FIGO grade 2 tumor with 10 of 69m myometrial invasion, negative LVSI and cervical stromal involvement. Bilateral SLN's were negative. 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  Since surgery she is doing very well with no major complaints or pain.  Review of Systems:10 point review of systems is negative except as noted in interval history.   Vitals: There were no vitals taken for this visit.  Physical Exam: General : The patient is a healthy woman in no acute distress.  HEENT: normocephalic, extraoccular movements normal; neck is supple without thyromegally  Lynphnodes: Supraclavicular and inguinal nodes not enlarged  Abdomen: Soft, non-tender, no ascites, no organomegally, no masses, no hernias. Incisions well healed. Pelvic:  EGBUS: Normal female (atrophic) Vagina: Normal, atrophic, no lesions. Cuff healing normally and in tact with no blood. Urethra and Bladder: Normal, non-tender  Cervix & uterus surgically absent. Bi-manual examination: cuff in tact. Rectal: normal sphincter tone, no masses, no blood  Lower extremities: incision healing normally on left leg/knee with no signs of infection. Can flex to 110 degrees.      No Known Allergies  Past Medical History:  Diagnosis Date  . Anemia    history ,  took iron for years  . Arthritis   . Bicuspid aortic valve   . Cancer [facility])    endometrial cancer  . Chronic back pain   . Diabetes ([place])    x 15 yrs with good  control  . GERD (gastroesophageal reflux disease)   . Hypertension     Past Surgical History:  Procedure Laterality Date  . BUNIONECTOMY    . COLONOSCOPY N/A 04/04/2014   Procedure: COLONOSCOPY;  Surgeon: Rogene Houston, MD;  [facility];  Service: Endoscopy;  Laterality: N/A;  100  . EYE SURGERY     bil cataracts and lens implants  . FOOT SURGERY     Left  . Ovary removed: benign    . REPLACEMENT TOTAL KNEE BILATERAL     2013  . ROBOTIC ASSISTED TOTAL HYSTERECTOMY WITH BILATERAL SALPINGO OOPHERECTOMY Left 04/13/2016   Procedure: XI ROBOTIC ASSISTED TOTAL HYSTERECTOMY WITH LEFT SALPINGO OOPHORECTOMY SENTINEL LYMPH NODE BIOPSY;  Surgeon: Everitt Amber, MD;  [facility];  Service: Gynecology;  Laterality: Left;  . TOTAL KNEE REVISION Left 02/12/2016   Procedure: LEFT TOTAL KNEE REVISION;  Surgeon: Rod Can, MD;  [facility];  Service: Orthopedics;  Laterality: Left;    Current Outpatient Prescriptions  Medication Sig Dispense Refill  . cholecalciferol (VITAMIN D) 400 UNITS TABS tablet Take 2,000 Units by mouth.    . docusate sodium (COLACE) 100 MG capsule Take 100 mg by mouth 2 (two) times daily.    . ferrous sulfate 325 (65 FE) MG tablet Take 1 tablet (325 mg total) by mouth 2 (two) times daily with a meal.  3  . HYDROcodone-acetaminophen (NORCO/VICODIN) 5-325 MG tablet Take 1-2 tablets by mouth every 6 (six) hours as needed for moderate pain. 30 tablet 0  . losartan-hydrochlorothiazide (HYZAAR) 100-12.5 MG tablet Take 1 tablet by mouth daily.  3  . metFORMIN (GLUMETZA) 500 MG (MOD) 24 hr tablet Take 500 mg by mouth every evening.    . Multiple Vitamins-Minerals (CENTRUM SILVER ADULT 50+) TABS Take 1 tablet by mouth daily.     . naproxen (NAPROSYN) 500 MG tablet Take 500 mg by mouth 2 (two) times daily with a meal.    . Omega-3 Fatty Acids (OMEGA 3 500 PO) Take 4 tablets by mouth.     Marland Kitchen omeprazole (PRILOSEC) 20 MG capsule TAKE 1 CAPSULE (20 MG TOTAL) BY MOUTH  DAILY. 90 capsule 3  . polyethylene glycol (MIRALAX / GLYCOLAX) packet Take 17 g by mouth daily as needed for moderate constipation.  0  . Probiotic Product (PHILLIPS COLON HEALTH PO) Take 1 tablet by mouth daily.     . sitaGLIPtin (JANUVIA) 100 MG tablet Take 100 mg by mouth daily.     No current facility-administered medications for this visit.     Social History   Social History  . Marital status: Widowed    Spouse name: N/A  . Number of children: N/A  . Years of education: N/A   Occupational History  . Not on file.   Social History Main Topics  . Smoking status: Never Smoker  . Smokeless tobacco: Never Used  . Alcohol use No  . Drug use: No  . Sexual activity: Not Currently   Other Topics Concern  . Not on file   Social History Narrative  . No narrative on file    Family History  Problem Relation Age of Onset  . Diabetes Maternal Grandmother      30 minutes of direct face  to face counseling time was spent with the patient. This included discussion about prognosis, therapy recommendations and postoperative side effects and are beyond the scope of routine postoperative care.  Donaciano Eva, MD 05/10/2016, 11:43 AM  CC: Dr Octavio Graves

## 2016-05-10 NOTE — Therapy (Signed)
[facility] [place], [place], [address] Phone: [phone]   Fax:  [phone]  Physical Therapy Treatment  Patient Details  Name: Susan Haney MRN: [id number] Date of Birth: [birth year] Referring Provider: Rod Can MD  Encounter Date: 05/10/2016      PT End of Session - 05/10/16 0939    Visit Number 11   Number of Visits 12   Date for PT Re-Evaluation 06/05/16   PT Start Time 0859   PT Stop Time 0957   PT Time Calculation (min) 58 min   Activity Tolerance Patient tolerated treatment well   Behavior During Therapy [facility] for tasks assessed/performed      Past Medical History:  Diagnosis Date  . Anemia    history ,  took iron for years  . Arthritis   . Bicuspid aortic valve   . Cancer [facility])    endometrial cancer  . Chronic back pain   . Diabetes ([place])    x 15 yrs with good control  . GERD (gastroesophageal reflux disease)   . Hypertension     Past Surgical History:  Procedure Laterality Date  . BUNIONECTOMY    . COLONOSCOPY N/A 04/04/2014   Procedure: COLONOSCOPY;  Surgeon: Rogene Houston, MD;  [facility];  Service: Endoscopy;  Laterality: N/A;  100  . EYE SURGERY     bil cataracts and lens implants  . FOOT SURGERY     Left  . Ovary removed: benign    . REPLACEMENT TOTAL KNEE BILATERAL     2013  . ROBOTIC ASSISTED TOTAL HYSTERECTOMY WITH BILATERAL SALPINGO OOPHERECTOMY Left 04/13/2016   Procedure: XI ROBOTIC ASSISTED TOTAL HYSTERECTOMY WITH LEFT SALPINGO OOPHORECTOMY SENTINEL LYMPH NODE BIOPSY;  Surgeon: Everitt Amber, MD;  Location: [facility];  Service: Gynecology;  Laterality: Left;  . TOTAL KNEE REVISION Left 02/12/2016   Procedure: LEFT TOTAL KNEE REVISION;  Surgeon: Rod Can, MD;  Location: [facility];  Service: Orthopedics;  Laterality: Left;    There were no vitals filed for this visit.      Subjective Assessment - 05/10/16 0909    Subjective Patient feels 60% improvement  overall yet knee continues to feel unstable and "gives way" at times   Pertinent History Previous left total knee replacement.  H/o low back pain with radiculopathy with chronic low back pain.   Patient Stated Goals Get back to a normal activity level.   Currently in Pain? Yes   Pain Score 6    Pain Location Knee   Pain Orientation Left   Pain Descriptors / Indicators Sore   Pain Type Surgical pain   Pain Onset More than a month ago   Pain Frequency Intermittent   Aggravating Factors  prolong exercises   Pain Relieving Factors at rest            [facility] PT Assessment - 05/10/16 0001      ROM / Strength   AROM / PROM / Strength AROM;PROM;Strength     AROM   AROM Assessment Site Knee   Right/Left Knee Left   Left Knee Flexion 110     PROM   PROM Assessment Site Knee   Right/Left Knee Left   Left Knee Extension 115     Strength   Strength Assessment Site Knee;Hip   Right/Left Hip Left   Left Hip ABduction 4/5   Right/Left Knee Left   Left Knee Flexion 4/5   Left Knee Extension 4-/5  [facility] Adult PT Treatment/Exercise - 05/10/16 0001      Knee/Hip Exercises: Seated   Long Arc Quad Strengthening;Left;4 sets;10 reps   Long Arc Quad Weight 2 lbs.   Sit to Sand 2 sets;10 reps;with UE support  left LE slight back RLE slight forward, RUE support     Knee/Hip Exercises: Sidelying   Hip ABduction AROM;Strengthening;Left;10 reps;1 set     Electrical Stimulation   Electrical Stimulation Location left VMO/quad  VMS x 10 min  10 sec  on/off  with SAQ 2#   Electrical Stimulation Goals Neuromuscular facilitation     Vasopneumatic   Number Minutes Vasopneumatic  15 minutes   Vasopnuematic Location  Knee   Vasopneumatic Pressure Medium                  PT Short Term Goals - 04/06/16 1219      PT SHORT TERM GOAL #1   Title STG's=LTG's.           PT Long Term Goals - 05/10/16 [id number]      PT LONG TERM GOAL #1   Title  Independent with a HEP.   Time 8   Period Weeks   Status Achieved     PT LONG TERM GOAL #2   Title Active left knee flexion to 115 degrees+ so the patient can perform functional tasks and do so with pain not > 2-3/10.   Time 8   Period Weeks   Status On-going  AROM 110 degrees 05/10/16     PT LONG TERM GOAL #3   Title Increase left knee strength to a solid 4+/5 to provide good stability for accomplishment of functional activities   Time 8   Period Weeks  hip abd 4/5, quad -4/5, HS 4/5 1023/17   Status On-going     PT LONG TERM GOAL #4   Title Perform a reciprocating stair gait with one railing with pain not > 2-3/10.   Time 8   Period Weeks   Status On-going  Patient unable to perform stairs at this time 05/10/16     PT LONG TERM GOAL #5   Title Walk in clinic with a straight cane.   Time 8   Period Weeks   Status On-going  Patient using a FWW at this time 05/10/16               Plan - 05/10/16 0940    Clinical Impression Statement Patient progressing with improved ROM and strength overall and tolerated tretment well. Patient continues to report increased pain inferior to knee cap with prolong exercises and has episodes of knee giving way. Patient feels therapy is helping and improvement up to 60%. Patient has good quad contol with SLR and has no ext lag. Patient still using a FWW due to episodes of knee giving way.   Rehab Potential Excellent   PT Frequency 3x / week   PT Duration 4 weeks   PT Treatment/Interventions ADLs/Self Care Home Management;Cryotherapy;Electrical Stimulation;Therapeutic activities;Therapeutic exercise;Patient/family education;Passive range of motion;Manual techniques;Vasopneumatic Device   PT Next Visit Plan cont with POC for strengthening progression (MD. [place] appt 05/11/16)   Consulted and Agree with Plan of Care Patient      Patient will benefit from skilled therapeutic intervention in order to improve the following deficits and  impairments:  Pain, Decreased activity tolerance, Decreased range of motion, Increased edema  Visit Diagnosis: Left knee pain, unspecified chronicity  Stiffness of left knee, not elsewhere classified  Localized edema  Problem List Patient Active Problem List   Diagnosis Date Noted  . Endometrial cancer ([place]) 04/13/2016  . Closed fracture of left distal femur, initial encounter 02/13/2016  . GERD (gastroesophageal reflux disease) 06/30/2015  . Chronic lower back pain 06/18/2014  . Diabetes ([place]) 02/21/2014  . Essential hypertension, benign 02/21/2014  . Unspecified constipation 02/21/2014    APPLEGATE, [place], PTA 05/10/2016, 10:07 AM   Mali Applegate MPT [facility] [address] [place], [place], [address] Phone: [phone]   Fax:  [phone]  Name: Susan Haney MRN: [id number] Date of Birth: [birth year]

## 2016-05-10 NOTE — Patient Instructions (Signed)
Plan to follow up after radiation.  Please call us when you are coming close to the end of your scheduled radiation appointments.  Please call for any questions or concerns.

## 2016-05-12 ENCOUNTER — Encounter: Payer: Self-pay | Admitting: Physical Therapy

## 2016-05-12 ENCOUNTER — Ambulatory Visit: Payer: Medicare Other | Admitting: Physical Therapy

## 2016-05-12 DIAGNOSIS — R600 Localized edema: Secondary | ICD-10-CM

## 2016-05-12 DIAGNOSIS — M25562 Pain in left knee: Secondary | ICD-10-CM

## 2016-05-12 DIAGNOSIS — M25662 Stiffness of left knee, not elsewhere classified: Secondary | ICD-10-CM

## 2016-05-12 NOTE — Therapy (Signed)
[facility] [place], [place], [address] Phone: [phone]   Fax:  [phone]  Physical Therapy Treatment  Patient Details  Name: Susan Haney MRN: [id number] Date of Birth: Jun 30, 1932 Referring Provider: Rod Can MD  Encounter Date: 05/12/2016    Past Medical History:  Diagnosis Date  . Anemia    history ,  took iron for years  . Arthritis   . Bicuspid aortic valve   . Cancer [facility])    endometrial cancer  . Chronic back pain   . Diabetes ([place])    x 15 yrs with good control  . Endometrial cancer ([place])   . GERD (gastroesophageal reflux disease)   . Hypertension     Past Surgical History:  Procedure Laterality Date  . BUNIONECTOMY    . COLONOSCOPY N/A 04/04/2014   Procedure: COLONOSCOPY;  Surgeon: Rogene Houston, MD;  [facility];  Service: Endoscopy;  Laterality: N/A;  100  . EYE SURGERY     bil cataracts and lens implants  . FOOT SURGERY     Left  . Ovary removed: benign    . REPLACEMENT TOTAL KNEE BILATERAL     2013  . ROBOTIC ASSISTED TOTAL HYSTERECTOMY WITH BILATERAL SALPINGO OOPHERECTOMY Left 04/13/2016   Procedure: XI ROBOTIC ASSISTED TOTAL HYSTERECTOMY WITH LEFT SALPINGO OOPHORECTOMY SENTINEL LYMPH NODE BIOPSY;  Surgeon: Everitt Amber, MD;  [facility];  Service: Gynecology;  Laterality: Left;  . TOTAL KNEE REVISION Left 02/12/2016   Procedure: LEFT TOTAL KNEE REVISION;  Surgeon: Rod Can, MD;  [facility];  Service: Orthopedics;  Laterality: Left;    There were no vitals filed for this visit.                                 PT Short Term Goals - 04/06/16 1219      PT SHORT TERM GOAL #1   Title STG's=LTG's.           PT Long Term Goals - 05/12/16 4742      PT LONG TERM GOAL #1   Title Independent with a HEP.   Time 8   Period Weeks   Status Achieved     PT LONG TERM GOAL #2   Title Active left knee flexion to 115 degrees+ so  the patient can perform functional tasks and do so with pain not > 2-3/10.   Time 8   Period Weeks   Status Not Met  AROM 110 degrees 05/12/16     PT LONG TERM GOAL #3   Title Increase left knee strength to a solid 4+/5 to provide good stability for accomplishment of functional activities   Time 8   Period Weeks   Status Not Met     PT LONG TERM GOAL #4   Title Perform a reciprocating stair gait with one railing with pain not > 2-3/10.   Time 8   Period Weeks   Status Not Met     PT LONG TERM GOAL #5   Title Walk in clinic with a straight cane.   Time 8   Period Weeks   Status Not Met  using walker perMD 05/12/16             Patient will benefit from skilled therapeutic intervention in order to improve the following deficits and impairments:  Pain, Decreased activity tolerance, Decreased range of motion, Increased edema  Visit Diagnosis:  Left knee pain, unspecified chronicity  Stiffness of left knee, not elsewhere classified  Localized edema     Problem List Patient Active Problem List   Diagnosis Date Noted  . Endometrial cancer ([place]) 04/13/2016  . Closed fracture of left distal femur, initial encounter 02/13/2016  . GERD (gastroesophageal reflux disease) 06/30/2015  . Chronic lower back pain 06/18/2014  . Diabetes ([place]) 02/21/2014  . Essential hypertension, benign 02/21/2014  . Unspecified constipation 02/21/2014    APPLEGATE, [place], PTA 05/14/2016, 4:17 PM    [facility] [address] [place], [place], [address] Phone: [phone]   Fax:  [phone]  Name: Susan Haney MRN: [id number] Date of Birth: [birth year]  PHYSICAL THERAPY DISCHARGE SUMMARY  Visits from Start of Care: 12.  Current functional level related to goals / functional outcomes: Please see above.   Remaining deficits: Continued loss of left knee range of motion and strength.   Education / Equipment: HEP.  Plan: Patient  agrees to discharge.  Patient goals were partially met. Patient is being discharged due to the physician's request.  ?????        Mali Applegate MPT

## 2016-05-14 ENCOUNTER — Encounter: Payer: Self-pay | Admitting: Radiation Oncology

## 2016-05-14 ENCOUNTER — Ambulatory Visit
Admission: RE | Admit: 2016-05-14 | Discharge: 2016-05-14 | Disposition: A | Discharge status: Home / Self Care | Payer: Medicare Other | Source: Ambulatory Visit | Attending: Radiation Oncology | Admitting: Radiation Oncology

## 2016-05-14 ENCOUNTER — Telehealth: Payer: Self-pay | Admitting: *Deleted

## 2016-05-14 ENCOUNTER — Institutional Professional Consult (permissible substitution): Payer: Medicare Other | Admitting: Radiation Oncology

## 2016-05-14 ENCOUNTER — Encounter: Payer: Medicare Other | Admitting: *Deleted

## 2016-05-14 DIAGNOSIS — Z90710 Acquired absence of both cervix and uterus: Secondary | ICD-10-CM | POA: Diagnosis not present

## 2016-05-14 DIAGNOSIS — Z90722 Acquired absence of ovaries, bilateral: Secondary | ICD-10-CM | POA: Diagnosis not present

## 2016-05-14 DIAGNOSIS — Z510 Encounter for antineoplastic radiation therapy: Secondary | ICD-10-CM | POA: Diagnosis not present

## 2016-05-14 DIAGNOSIS — G8929 Other chronic pain: Secondary | ICD-10-CM | POA: Diagnosis not present

## 2016-05-14 DIAGNOSIS — C541 Malignant neoplasm of endometrium: Secondary | ICD-10-CM | POA: Diagnosis present

## 2016-05-14 DIAGNOSIS — Z9079 Acquired absence of other genital organ(s): Secondary | ICD-10-CM | POA: Diagnosis not present

## 2016-05-14 DIAGNOSIS — M545 Low back pain: Secondary | ICD-10-CM | POA: Diagnosis not present

## 2016-05-14 HISTORY — DX: Malignant neoplasm of endometrium: C54.1

## 2016-05-14 NOTE — Progress Notes (Signed)
Please see the Nurse Progress Note in the MD Initial Consult Encounter for this patient. 

## 2016-05-14 NOTE — Telephone Encounter (Signed)
Called patient to inform of New HDR Vag. Cuff Case, lvm for a return call 

## 2016-05-14 NOTE — Progress Notes (Signed)
Radiation Oncology         [phone] ________________________________  Initial Outpatient Consultation  Name: Susan Haney MRN: [id number]  Date: 05/14/2016  DOB: 10-Jan-1932  WU:[id number] BUTLER, DO  Everitt Amber, MD   REFERRING PHYSICIAN: Everitt Amber, MD  DIAGNOSIS: stage IB grade 2 endometrioid endometrial adenocarcinoma with high/intermediate risk factors and Microsatellite instability on routine tumor testing.  HISTORY OF PRESENT ILLNESS::Susan Haney is a 82 y.o. female who is seen out of the courtesy of Dr. Denman George for an opinion concerning radiation therapy as part of management of patient's early stage endometrial cancer. Patient presented with postmenopausal bleeding in July. She was originally scheduled for surgery but this was postponed when she  fell and fractured her left distal femur assoicated with a knee replacement on 02/12/16. She required emergent repair and replacement and had been in rehab since that time. She underwent endometrial biopsy which revealed a grade 1-2 endometrial adenocarcinoma..  Pelvic ultrasound showed the uterus to measure 7 x 4.4 x 5.6 cm with 2 small fibroids. The left ovary surgically absent,  right ovary appeared normal there is no free fluid.  Patient was seen by Dr. Denman George and on  On 04/13/16 performed robotic assisted total hysterectomy, BSO, bilateral SLN biopsy.  Final pathology revealed a 4.3cm FIGO grade 2 tumor with 10 of 95m myometrial invasion, negative LVSI and cervical stromal involvement. Bilateral SLN's were negative. 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  She has been doing well since her surgery   PREVIOUS RADIATION THERAPY: No  PAST MEDICAL HISTORY:  has a past medical history of Anemia; Arthritis; Bicuspid aortic valve; Cancer ([place]; Chronic back pain; Diabetes ([place]; [place]; GERD (gastroesophageal reflux disease); and Hypertension.    PAST SURGICAL HISTORY: Past Surgical History:    Procedure Laterality Date  . BUNIONECTOMY    . COLONOSCOPY N/A 04/04/2014   Procedure: COLONOSCOPY;  Surgeon: NRogene Houston MD;  [facility];  Service: Endoscopy;  Laterality: N/A;  100  . EYE SURGERY     bil cataracts and lens implants  . FOOT SURGERY     Left  . Ovary removed: benign    . REPLACEMENT TOTAL KNEE BILATERAL     2013  . ROBOTIC ASSISTED TOTAL HYSTERECTOMY WITH BILATERAL SALPINGO OOPHERECTOMY Left 04/13/2016   Procedure: XI ROBOTIC ASSISTED TOTAL HYSTERECTOMY WITH LEFT SALPINGO OOPHORECTOMY SENTINEL LYMPH NODE BIOPSY;  Surgeon: EEveritt Amber MD;  [facility];  Service: Gynecology;  Laterality: Left;  . TOTAL KNEE REVISION Left 02/12/2016   Procedure: LEFT TOTAL KNEE REVISION;  Surgeon: BRod Can MD;  [facility];  Service: Orthopedics;  Laterality: Left;    FAMILY HISTORY: family history includes Diabetes in her maternal grandmother; Prostate cancer in her father.  SOCIAL HISTORY:  reports that she has never smoked. She has never used smokeless tobacco. She reports that she does not drink alcohol or use drugs.  ALLERGIES: Review of patient's allergies indicates no known allergies.  MEDICATIONS:  Current Outpatient Prescriptions  Medication Sig Dispense Refill  . cholecalciferol (VITAMIN D) 400 UNITS TABS tablet Take 2,000 Units by mouth.    . docusate sodium (COLACE) 100 MG capsule Take 100 mg by mouth 2 (two) times daily.    . ferrous sulfate 325 (65 FE) MG tablet Take 1 tablet (325 mg total) by mouth 2 (two) times daily with a meal.  3  . HYDROcodone-acetaminophen (NORCO/VICODIN) 5-325 MG tablet Take 1-2 tablets by mouth every  6 (six) hours as needed for moderate pain. 30 tablet 0  . losartan-hydrochlorothiazide (HYZAAR) 100-12.5 MG tablet Take 1 tablet by mouth daily.  3  . metFORMIN (GLUMETZA) 500 MG (MOD) 24 hr tablet Take 500 mg by mouth every evening.    . Multiple Vitamins-Minerals (CENTRUM SILVER ADULT 50+) TABS Take 1 tablet by mouth  daily.     . naproxen (NAPROSYN) 500 MG tablet Take 500 mg by mouth 2 (two) times daily with a meal.    . Omega-3 Fatty Acids (OMEGA 3 500 PO) Take 4 tablets by mouth.     . omeprazole (PRILOSEC) 20 MG capsule TAKE 1 CAPSULE (20 MG TOTAL) BY MOUTH DAILY. 90 capsule 3  . polyethylene glycol (MIRALAX / GLYCOLAX) packet Take 17 g by mouth daily as needed for moderate constipation.  0  . Probiotic Product (PHILLIPS COLON HEALTH PO) Take 1 tablet by mouth daily.     . sitaGLIPtin (JANUVIA) 100 MG tablet Take 100 mg by mouth daily.     No current facility-administered medications for this encounter.     REVIEW OF SYSTEMS:  A 15 point review of systems is documented in the electronic medical record. This was obtained by the nursing staff. However, I reviewed this with the patient to discuss relevant findings and make appropriate changes. No vaginal bleeding or discharge since surgery. The patient denies any hematuria or rectal bleeding. She has chronic problems with constipation   PHYSICAL EXAM:  height is 5' 3" (1.6 m) and weight is 201 lb 3.2 oz (91.3 kg). Her oral temperature is 98.4 F (36.9 C). Her blood pressure is 151/73 (abnormal) and her pulse is 75. Her oxygen saturation is 99%.   General: Alert and oriented, in no acute distress, Ambulates with the assistance of a walker. Accompanied by sister-in-law on evaluation today HEENT: Head is normocephalic. Extraocular movements are intact. Oropharynx is clear. Neck: Neck is supple, no palpable cervical or supraclavicular lymphadenopathy. Heart: Regular in rate and rhythm with no murmurs, rubs, or gallops. Chest: Clear to auscultation bilaterally, with no rhonchi, wheezes, or rales. Abdomen: Soft, nontender, nondistended, with no rigidity or guarding. Small scars present from laparoscopic procedure. No signs of drainage or infection Extremities: No cyanosis or edema. Long scar overlying the left knee. Lymphatics: see Neck Exam Skin: No concerning  lesions. Musculoskeletal: symmetric strength and muscle tone throughout. Neurologic: Cranial nerves II through XII are grossly intact. No obvious focalities. Speech is fluent. Coordination is intact. Psychiatric: Judgment and insight are intact. Affect is appropriate. Pelvic examination deferred until simulation and planning and treatment day. Recent exam by Dr. [name] revealed the vaginal cuff to be intact without dehiscence.    ECOG = 1  LABORATORY DATA:  Lab Results  Component Value Date   WBC 13.2 (H) 04/14/2016   HGB 9.4 (L) 04/14/2016   HCT 29.8 (L) 04/14/2016   MCV 88.2 04/14/2016   PLT 288 04/14/2016   NEUTROABS 5.3 04/09/2016   Lab Results  Component Value Date   NA 140 04/14/2016   K 4.0 04/14/2016   CL 111 04/14/2016   CO2 22 04/14/2016   GLUCOSE 106 (H) 04/14/2016   CREATININE 0.92 04/14/2016   CALCIUM 9.3 04/14/2016      RADIOGRAPHY: No results found.   IMPRESSION: stage IB grade 2 endometrioid endometrial adenocarcinoma with high/intermediate risk factors and Microsatellite instability on routine tumor testing. The patient would be at risk for vaginal cuff recurrence and would recommend vaginal brachytherapy to reduce risk for local   recurrence in accordance with  NCCN guidelines. I discussed course of treatment, expected benefits, side effects and potential toxicities of intracavitary brachytherapy treatments with the patient and her sister. The patient appears to understand and wishes to proceed with planned course of treatment  PLAN: Simulation planning and first treatment week of November 7. Anticipate 5 intracavitary brachytherapy treatments for a postoperative brachytherapy dose of 30 gray to the vaginal cuff region. The patient will meet with genetics in November for discussion and testing for Lynch syndrome.    ------------------------------------------------  James D. Kinard, PhD, MD        

## 2016-05-14 NOTE — Progress Notes (Signed)
GYN Location of Tumor / Histology: Endometrial adenocarcinoma   Susan Haney presented with symptoms of: postmenopausal bleeding - she had some spotting in July.  Biopsies revealed:   04/13/16 Diagnosis 1. Lymph node, sentinel, biopsy, right external iliac ONE BENIGN LYMPH NODE (0/1) 2. Lymph node, sentinel, biopsy, right obturator ONE BENIGN LYMPH NODE (0/1) 3. Lymph node, sentinel, biopsy, left obturator ONE BENIGN LYMPH NODE (0/1) 4. Uterus and cervix, right fallopian tube and ovary ENDOMETRIOID CARCINOMA (4.3 CM), FIGO GRADE 2 THE TUMOR INVADES ONE HALF OF THE MYOMETRIUM (PT1B) ALL MARGINS OF RESECTION ARE NEGATIVE FOR CARCINOMA LEIOMYOMAS UNREMARKABLE RIGHT OVARY AND FALLOPIAN TUBE  Past/Anticipated interventions by Gyn/Onc surgery, if any: 04/13/16 - Procedure: XI ROBOTIC ASSISTED TOTAL HYSTERECTOMY WITH LEFT SALPINGO OOPHORECTOMY SENTINEL LYMPH NODE BIOPSY;  Surgeon: Everitt Amber, MD  Past/Anticipated interventions by medical oncology, if any: none  Weight changes, if any: no  Bowel/Bladder complaints, if any: denies having any bladder issues, reports having constipation and takes colace and Miralax.  Nausea/Vomiting, if any: no  Pain issues, if any:  Yes - has chronic lower back pain.  SAFETY ISSUES:  Prior radiation? no  Pacemaker/ICD? no  Possible current pregnancy? no  Is the patient on methotrexate? no  Current Complaints / other details:  Patient is here with her sister.  She said she was originally scheduled for the hysterectomy in July but she feel and had to have surgery on her left leg and knee.  She is using a walker to ambulate and hs finished physical therapy.  BP (!) 151/73 (BP Location: Left Arm, Patient Position: Sitting)   Pulse 75   Temp 98.4 F (36.9 C) (Oral)   Ht 5\' 3"  (1.6 m)   Wt 201 lb 3.2 oz (91.3 kg)   SpO2 99%   BMI 35.64 kg/m    Wt Readings from Last 3 Encounters:  05/14/16 201 lb 3.2 oz (91.3 kg)  04/13/16 199 lb 15.3 oz  (90.7 kg)  04/09/16 198 lb (89.8 kg)

## 2016-05-21 ENCOUNTER — Encounter (INDEPENDENT_AMBULATORY_CARE_PROVIDER_SITE_OTHER): Payer: Self-pay | Admitting: Internal Medicine

## 2016-05-26 ENCOUNTER — Telehealth: Payer: Self-pay | Admitting: *Deleted

## 2016-05-26 NOTE — Telephone Encounter (Signed)
CALLED PATIENT TO REMIND OF HDR VAG. CUFF CASE FOR 05-27-16, SPOKE WITH PATIENT AND SHE IS AWARE OF THESE APPTS.

## 2016-05-27 ENCOUNTER — Encounter: Payer: Self-pay | Admitting: Radiation Oncology

## 2016-05-27 ENCOUNTER — Ambulatory Visit
Admission: RE | Admit: 2016-05-27 | Discharge: 2016-05-27 | Disposition: A | Discharge status: Home / Self Care | Payer: Medicare Other | Source: Ambulatory Visit | Attending: Radiation Oncology | Admitting: Radiation Oncology

## 2016-05-27 ENCOUNTER — Other Ambulatory Visit: Payer: Self-pay | Admitting: Gynecologic Oncology

## 2016-05-27 ENCOUNTER — Ambulatory Visit: Payer: Medicare Other | Admitting: Radiation Oncology

## 2016-05-27 VITALS — BP 153/76 | HR 86 | Temp 98.4°F | Ht 63.0 in | Wt 199.4 lb

## 2016-05-27 DIAGNOSIS — T8131XA Disruption of external operation (surgical) wound, not elsewhere classified, initial encounter: Secondary | ICD-10-CM

## 2016-05-27 DIAGNOSIS — C541 Malignant neoplasm of endometrium: Secondary | ICD-10-CM | POA: Insufficient documentation

## 2016-05-27 MED ORDER — HYDROCODONE-ACETAMINOPHEN 5-325 MG PO TABS
1.0000 | ORAL_TABLET | Freq: Once | ORAL | Status: AC
  Administered 2016-05-27: 1 via ORAL
  Filled 2016-05-27: qty 1

## 2016-05-27 MED ORDER — ESTROGENS, CONJUGATED 0.625 MG/GM VA CREA: 1.0000 | TOPICAL_CREAM | Freq: Every day | VAGINAL | 1 refills | Status: DC

## 2016-05-27 NOTE — Progress Notes (Signed)
Radiation Oncology         [phone] ________________________________  Name: Susan Haney MRN: [id number]  Date: 05/27/2016  DOB: [birth year]  Follow-up  Note  CC: Susan BUTLER, DO Everitt Amber, MD    ICD-9-CM ICD-10-CM   1. Endometrial cancer (HCC) 182.0 C54.1     Diagnosis: Stage IB grade 2 endometrioid endometrial adenocarcinoma with high/intermediate risk factors and microsatellite instability on routine tumor testing.   Narrative: She returns today for vaginal cylinder fitting. She reports 6/10 pain to her left leg. She reports taking naproxen daily for the pain. Occasionally, she reports taking Tylenol. This pain has been presenting her surgery after a fall in July. She denies urinary symptoms such as, burning, hematuria, or frequency. She denies vaginal discharge. She reports "pressure" when she urinates and has a bowel movement. She reports a daily bowel movement with the use of laxatives. She also reports recent sinus congestion.  ALLERGIES: has No Known Allergies.  Meds: Current Outpatient Prescriptions  Medication Sig Dispense Refill  . aspirin EC 81 MG tablet Take 81 mg by mouth daily.    . cholecalciferol (VITAMIN D) 400 UNITS TABS tablet Take 2,000 Units by mouth.    . docusate sodium (COLACE) 100 MG capsule Take 100 mg by mouth 2 (two) times daily.    . ferrous sulfate 325 (65 FE) MG tablet Take 1 tablet (325 mg total) by mouth 2 (two) times daily with a meal.  3  . losartan-hydrochlorothiazide (HYZAAR) 100-12.5 MG tablet Take 1 tablet by mouth daily.  3  . metFORMIN (GLUMETZA) 500 MG (MOD) 24 hr tablet Take 500 mg by mouth every evening.    . Multiple Vitamins-Minerals (CENTRUM SILVER ADULT 50+) TABS Take 1 tablet by mouth daily.     . naproxen (NAPROSYN) 500 MG tablet Take 500 mg by mouth 2 (two) times daily with a meal.    . Omega-3 Fatty Acids (OMEGA 3 500 PO) Take 4 tablets by mouth.     Marland Kitchen omeprazole (PRILOSEC) 20 MG capsule TAKE 1 CAPSULE (20 MG TOTAL) BY  MOUTH DAILY. 90 capsule 3  . polyethylene glycol (MIRALAX / GLYCOLAX) packet Take 17 g by mouth daily as needed for moderate constipation.  0  . Probiotic Product (PHILLIPS COLON HEALTH PO) Take 1 tablet by mouth daily.     . sitaGLIPtin (JANUVIA) 100 MG tablet Take 100 mg by mouth daily.    Marland Kitchen HYDROcodone-acetaminophen (NORCO/VICODIN) 5-325 MG tablet Take 1-2 tablets by mouth every 6 (six) hours as needed for moderate pain. (Patient not taking: Reported on 05/27/2016) 30 tablet 0   No current facility-administered medications for this encounter.     Physical Findings: The patient is in no acute distress. Patient is alert and oriented.  height is _0  (1.6 m) and weight is 199 lb 6.4 oz (90.4 kg). Her temperature is 98.4 F (36.9 C). Her blood pressure is 153/76 (abnormal) and her pulse is 86. Her oxygen saturation is 95%.   No palpable cervical, supraclavicular or axillary lymphoadenopathy. The heart has a regular rate and rhythm. The lungs are clear to auscultation. Abdomen soft and non-tender.  On pelvic examination the external genitalia were unremarkable. A speculum exam was performed. There was slight separation of the vaginal cuff. No mucosal lesions. On bimanual exam there were no pelvic masses appreciated. Erythematous reaction along inguinal area and skin folds of the lower abdomen consistent with a yeast infection. There is some mild bleeding noted in the right inguinal area. Erythematous  slightly raised lesions measuring 2-3 cm in size on the abdomen appear to be fungal in nature.   Lab Findings: Lab Results  Component Value Date   WBC 13.2 (H) 04/14/2016   HGB 9.4 (L) 04/14/2016   HCT 29.8 (L) 04/14/2016   MCV 88.2 04/14/2016   PLT 288 04/14/2016    Radiographic Findings: No results found.  Impression: Stage IB grade 2 endometrioid endometrial adenocarcinoma with high/intermediate risk factors and microsatellite instability on routine tumor testing. The patient is not  completely healed since her surgery. [name] NP was consulted with Dr. Serita Grit office and she confirmed slight separation of cuff. Recommended to hold off on radiation therapy for 2 weeks.    Plan: The patient will see Dr. Denman George in 1 week for exam . She will also be placed on vaginal estrogen cream to aide in healing. She was also given antifungal powder for inguinal fungal infection. Also recommend patient use Lotrimin cream for lesions along abdomen which appear to be a fiungal infection. Treatment start is pending upon Dr. Serita Grit exam next week.   _______________________________   Blair Promise, PhD, MD  This document serves as a record of services personally performed by Gery Pray, MD. It was created on his behalf by Bethann Humble, a trained medical scribe. The creation of this record is based on the scribe's personal observations and the provider's statements to them. This document has been checked and approved by the attending provider.

## 2016-05-27 NOTE — Progress Notes (Signed)
Susan Haney is here for her HDR treatment. She reports pain in her back and her left leg a 6/10. She takes naproxen daily for this pain and occasional Tylenol. This pain has been present since her surgery after a fall in July. She denies urinary symptoms including burning, hematuria, or frequency. She denies any vaginal discharge. She does report "pressure" when she urinates and has a bowel movement. She is having daily bowel movements with the use of laxatives. She also reports recent sinus congestion currently.  BP (!) 153/76   Pulse 86   Temp 98.4 F (36.9 C)   Ht 5\' 3"  (1.6 m)   Wt 199 lb 6.4 oz (90.4 kg)   SpO2 95% Comment: room air  BMI 35.32 kg/m    Wt Readings from Last 3 Encounters:  05/27/16 199 lb 6.4 oz (90.4 kg)  05/14/16 201 lb 3.2 oz (91.3 kg)  04/13/16 199 lb 15.3 oz (90.7 kg)

## 2016-05-28 ENCOUNTER — Telehealth: Payer: Self-pay | Admitting: Gynecologic Oncology

## 2016-05-28 NOTE — Telephone Encounter (Signed)
Spoke with patient and informed her to use premarin cream daily, only using 1/2 applicator full each day.  Follow up appt for Nov 17 moved to Nov 20 to allow for more healing time.  Advised to call for any questions or concerns.

## 2016-06-01 ENCOUNTER — Telehealth: Payer: Self-pay | Admitting: Genetic Counselor

## 2016-06-01 NOTE — Telephone Encounter (Signed)
Patient called to cancel  Appointments per really bad back issues.

## 2016-06-02 ENCOUNTER — Encounter: Payer: Self-pay | Admitting: Radiation Oncology

## 2016-06-03 ENCOUNTER — Ambulatory Visit: Payer: Medicare Other | Admitting: Radiation Oncology

## 2016-06-03 ENCOUNTER — Encounter: Payer: Medicare Other | Admitting: Genetic Counselor

## 2016-06-03 ENCOUNTER — Other Ambulatory Visit: Payer: Medicare Other

## 2016-06-04 ENCOUNTER — Ambulatory Visit: Payer: Medicare Other | Admitting: Gynecology

## 2016-06-07 ENCOUNTER — Encounter: Payer: Self-pay | Admitting: Gynecology

## 2016-06-07 ENCOUNTER — Ambulatory Visit: Payer: Medicare Other | Attending: Gynecology | Admitting: Gynecology

## 2016-06-07 VITALS — BP 140/60 | HR 81 | Temp 98.0°F | Resp 18 | Ht 63.0 in | Wt 201.3 lb

## 2016-06-07 DIAGNOSIS — Z833 Family history of diabetes mellitus: Secondary | ICD-10-CM | POA: Diagnosis not present

## 2016-06-07 DIAGNOSIS — Z90722 Acquired absence of ovaries, bilateral: Secondary | ICD-10-CM | POA: Diagnosis not present

## 2016-06-07 DIAGNOSIS — K219 Gastro-esophageal reflux disease without esophagitis: Secondary | ICD-10-CM | POA: Insufficient documentation

## 2016-06-07 DIAGNOSIS — E119 Type 2 diabetes mellitus without complications: Secondary | ICD-10-CM | POA: Insufficient documentation

## 2016-06-07 DIAGNOSIS — I10 Essential (primary) hypertension: Secondary | ICD-10-CM | POA: Diagnosis not present

## 2016-06-07 DIAGNOSIS — Z8042 Family history of malignant neoplasm of prostate: Secondary | ICD-10-CM | POA: Diagnosis not present

## 2016-06-07 DIAGNOSIS — Z7982 Long term (current) use of aspirin: Secondary | ICD-10-CM | POA: Diagnosis not present

## 2016-06-07 DIAGNOSIS — M545 Low back pain: Secondary | ICD-10-CM | POA: Insufficient documentation

## 2016-06-07 DIAGNOSIS — G8929 Other chronic pain: Secondary | ICD-10-CM | POA: Insufficient documentation

## 2016-06-07 DIAGNOSIS — C541 Malignant neoplasm of endometrium: Secondary | ICD-10-CM

## 2016-06-07 DIAGNOSIS — Z90710 Acquired absence of both cervix and uterus: Secondary | ICD-10-CM | POA: Insufficient documentation

## 2016-06-07 DIAGNOSIS — M1990 Unspecified osteoarthritis, unspecified site: Secondary | ICD-10-CM | POA: Diagnosis not present

## 2016-06-07 DIAGNOSIS — Z7984 Long term (current) use of oral hypoglycemic drugs: Secondary | ICD-10-CM | POA: Insufficient documentation

## 2016-06-07 DIAGNOSIS — Q231 Congenital insufficiency of aortic valve: Secondary | ICD-10-CM | POA: Diagnosis not present

## 2016-06-07 DIAGNOSIS — Z96652 Presence of left artificial knee joint: Secondary | ICD-10-CM | POA: Diagnosis not present

## 2016-06-07 NOTE — Patient Instructions (Signed)
Plan to follow up with Dr. Sondra Come to get treatment going.  Also make an appointment to meet with your dermatologist to discuss the areas on your abdomen.  You will receive a phone call from Dr. Clabe Seal office with upcoming appointments.  Please call for any questions or concerns.  You do not need to continue use of the premarin cream at this time.

## 2016-06-07 NOTE — Progress Notes (Signed)
Consult Note: Gyn-Onc   Susan Haney 82 y.o. female  Chief Complaint  Patient presents with  . endometrial cancer     AssessmentAnd plan :Stage IB grade 2 endometrial carcinoma with high/intermediate risk factors. It appears that her vaginal cuff is healing well and she can go ahead with vaginal vault brachytherapy.  On an unrelated note the patient should return to her dermatologist for further evaluation and management of skin lesions.   Interval History: The patient presents today for further evaluation of her vaginal cuff. She was recently seen by Dr. Gery Pray who recognize that her vaginal cuff was not completely healed. She is placed on Premarin vaginal cream. Since then she's done well. She denies any bleeding.  HPI: Patient underwent a robotic-assisted hysterectomy on 04/13/2016 with bilateral sentinel node biopsies. She had a grade 2 lesion with 50% myometrial invasion. Vaginal vault brachytherapy was recommended.  Review of Systems:10 point review of systems is negative except as noted in interval history.   Vitals: Blood pressure (!) 126/59, pulse 70, temperature 98.5 F (36.9 C), temperature source Oral, resp. rate 18, height 5\' 3"  (1.6 m), weight 197 lb 14.4 oz (89.8 kg), SpO2 100 %.  Physical Exam: General : The patient is a healthy woman in no acute distress.  HEENT: normocephalic, extraoccular movements normal; neck is supple without thyromegally  Lynphnodes: Supraclavicular and inguinal nodes not enlarged  Abdomen: Soft, non-tender, no ascites, no organomegally, no masses, no hernias  Pelvic:  EGBUS: Normal female  Vagina: Normal, no lesions , there is a 5 mm area of granulation tissue the center of the vault touch with silver nitrate. Otherwise the vagina seems to be healing well. Urethra and Bladder: Normal, non-tender  Cervix: Surgically absent  Uterus: Surgically absent  Bi-manual examination: Non-tender; no adenxal masses or nodularity  Rectal: normal  sphincter tone, no masses, no blood  Lower extremities: No edema or varicosities. Normal range of motion         No Known Allergies  Past Medical History:  Diagnosis Date  . Anemia    history ,  took iron for years  . Arthritis   . Bicuspid aortic valve   . Cancer [facility])    endometrial cancer  . Chronic back pain   . Diabetes ([place])    x 15 yrs with good control  . Endometrial cancer ([place])   . GERD (gastroesophageal reflux disease)   . Hypertension     Past Surgical History:  Procedure Laterality Date  . BUNIONECTOMY    . COLONOSCOPY N/A 04/04/2014   Procedure: COLONOSCOPY;  Surgeon: Rogene Houston, MD;  Location: [facility]ITE;  Service: Endoscopy;  Laterality: N/A;  100  . EYE SURGERY     bil cataracts and lens implants  . FOOT SURGERY     Left  . Ovary removed: benign    . REPLACEMENT TOTAL KNEE BILATERAL     2013  . ROBOTIC ASSISTED TOTAL HYSTERECTOMY WITH BILATERAL SALPINGO OOPHERECTOMY Left 04/13/2016   Procedure: XI ROBOTIC ASSISTED TOTAL HYSTERECTOMY WITH LEFT SALPINGO OOPHORECTOMY SENTINEL LYMPH NODE BIOPSY;  Surgeon: Everitt Amber, MD;  Location: [facility] ORS;  Service: Gynecology;  Laterality: Left;  . TOTAL KNEE REVISION Left 02/12/2016   Procedure: LEFT TOTAL KNEE REVISION;  Surgeon: Rod Can, MD;  Location: [facility] ORS;  Service: Orthopedics;  Laterality: Left;    Current Outpatient Prescriptions  Medication Sig Dispense Refill  . aspirin EC 81 MG tablet Take 81 mg by mouth daily.    Marland Kitchen  cholecalciferol (VITAMIN D) 400 UNITS TABS tablet Take 2,000 Units by mouth.    . conjugated estrogens (PREMARIN) vaginal cream Place 1 Applicatorful vaginally daily. Please give applicator as well 123XX123 g 1  . docusate sodium (COLACE) 100 MG capsule Take 100 mg by mouth 2 (two) times daily.    . ferrous sulfate 325 (65 FE) MG tablet Take 1 tablet (325 mg total) by mouth 2 (two) times daily with a meal.  3  . losartan-hydrochlorothiazide (HYZAAR) 100-12.5 MG tablet Take 1 tablet by  mouth daily.  3  . metFORMIN (GLUMETZA) 500 MG (MOD) 24 hr tablet Take 500 mg by mouth every evening.    . Multiple Vitamins-Minerals (CENTRUM SILVER ADULT 50+) TABS Take 1 tablet by mouth daily.     . naproxen (NAPROSYN) 500 MG tablet Take 500 mg by mouth 2 (two) times daily with a meal.    . Omega-3 Fatty Acids (OMEGA 3 500 PO) Take 4 tablets by mouth.     Marland Kitchen omeprazole (PRILOSEC) 20 MG capsule TAKE 1 CAPSULE (20 MG TOTAL) BY MOUTH DAILY. 90 capsule 3  . polyethylene glycol (MIRALAX / GLYCOLAX) packet Take 17 g by mouth daily as needed for moderate constipation.  0  . Probiotic Product (PHILLIPS COLON HEALTH PO) Take 1 tablet by mouth daily.     . sitaGLIPtin (JANUVIA) 100 MG tablet Take 100 mg by mouth daily.     No current facility-administered medications for this visit.     Social History   Social History  . Marital status: Widowed    Spouse name: N/A  . Number of children: 2  . Years of education: N/A   Occupational History  . Not on file.   Social History Main Topics  . Smoking status: Never Smoker  . Smokeless tobacco: Never Used  . Alcohol use No  . Drug use: No  . Sexual activity: Not Currently   Other Topics Concern  . Not on file   Social History Narrative  . No narrative on file    Family History  Problem Relation Age of Onset  . Prostate cancer Father   . Diabetes Maternal Grandmother       Marti Sleigh, MD 06/07/2016, 11:13 AM

## 2016-06-08 ENCOUNTER — Telehealth: Payer: Self-pay | Admitting: *Deleted

## 2016-06-08 NOTE — Telephone Encounter (Signed)
CALLED PATIENT TO INFORM OF HDR VAG. CUFF CASE FOR 06-15-16 AND FOUR ADDITIONAL TXS. SPOKE WITH PATIENT AND SHE IS AWARE OF THESE APPTS.

## 2016-06-09 ENCOUNTER — Ambulatory Visit: Payer: Medicare Other | Admitting: Radiation Oncology

## 2016-06-14 ENCOUNTER — Telehealth: Payer: Self-pay | Admitting: *Deleted

## 2016-06-14 NOTE — Telephone Encounter (Signed)
Called patient to remind of HDR Vag. Cuff Case for 06-15-16, spoke with patient and she is aware of this case.

## 2016-06-15 ENCOUNTER — Ambulatory Visit
Admission: RE | Admit: 2016-06-15 | Discharge: 2016-06-15 | Disposition: A | Discharge status: Home / Self Care | Payer: Medicare Other | Source: Ambulatory Visit | Attending: Radiation Oncology | Admitting: Radiation Oncology

## 2016-06-15 ENCOUNTER — Encounter: Payer: Self-pay | Admitting: Radiation Oncology

## 2016-06-15 VITALS — BP 138/89 | HR 83 | Temp 98.4°F | Ht 63.0 in | Wt 198.4 lb

## 2016-06-15 DIAGNOSIS — C541 Malignant neoplasm of endometrium: Secondary | ICD-10-CM | POA: Insufficient documentation

## 2016-06-15 DIAGNOSIS — M545 Low back pain: Secondary | ICD-10-CM | POA: Insufficient documentation

## 2016-06-15 DIAGNOSIS — Z510 Encounter for antineoplastic radiation therapy: Secondary | ICD-10-CM | POA: Diagnosis not present

## 2016-06-15 DIAGNOSIS — M25562 Pain in left knee: Secondary | ICD-10-CM | POA: Insufficient documentation

## 2016-06-15 NOTE — Progress Notes (Signed)
Radiation Oncology         [phone] ________________________________  Name: Susan Haney MRN: [id number]  Date: 06/15/2016  DOB: 1932-03-02   Vaginal Brachytherapy procedure note  CC: CYNTHIA BUTLER, DO Everitt Amber, MD    ICD-9-CM ICD-10-CM   1. Endometrial cancer (HCC) 182.0 C54.1     Diagnosis: Stage IB grade 2 endometrioid endometrial adenocarcinoma with high/intermediate risk factors and microsatellite instability on routine tumor testing.   Narrative: She returns today for a second vaginal cylinder fitting. Initial attempts showed the vaginal cuff was not healed well. She continues to report having pain in her lower back and left knee. She denies having any bladder or bowel issues. She also denies having any vaginal bleeding. She saw Dr. Fermin Schwab on 06/07/16 and was told to stop the premarin cream. She reports her skin irritation is better but still has some itching; she is using an antifungal powder. She reports the lotrimin cream gave her bumps. She has also started using Cortisone cream.  ALLERGIES: has No Known Allergies.  Meds: Current Outpatient Prescriptions  Medication Sig Dispense Refill  . aspirin EC 81 MG tablet Take 81 mg by mouth daily.    . cholecalciferol (VITAMIN D) 400 UNITS TABS tablet Take 2,000 Units by mouth.    . conjugated estrogens (PREMARIN) vaginal cream Place 1 Applicatorful vaginally daily. Please give applicator as well (Patient not taking: Reported on 06/15/2016) 42.5 g 1  . docusate sodium (COLACE) 100 MG capsule Take 100 mg by mouth 2 (two) times daily.    . ferrous sulfate 325 (65 FE) MG tablet Take 1 tablet (325 mg total) by mouth 2 (two) times daily with a meal.  3  . HYDROcodone-acetaminophen (NORCO/VICODIN) 5-325 MG tablet     . losartan-hydrochlorothiazide (HYZAAR) 100-12.5 MG tablet Take 1 tablet by mouth daily.  3  . metFORMIN (GLUMETZA) 500 MG (MOD) 24 hr tablet Take 500 mg by mouth every evening.    . Multiple  Vitamins-Minerals (CENTRUM SILVER ADULT 50+) TABS Take 1 tablet by mouth daily.     . naproxen (NAPROSYN) 500 MG tablet Take 500 mg by mouth 2 (two) times daily with a meal.    . Omega-3 Fatty Acids (OMEGA 3 500 PO) Take 4 tablets by mouth.     Marland Kitchen omeprazole (PRILOSEC) 20 MG capsule TAKE 1 CAPSULE (20 MG TOTAL) BY MOUTH DAILY. 90 capsule 3  . polyethylene glycol (MIRALAX / GLYCOLAX) packet Take 17 g by mouth daily as needed for moderate constipation.  0  . Probiotic Product (PHILLIPS COLON HEALTH PO) Take 1 tablet by mouth daily.     . sitaGLIPtin (JANUVIA) 100 MG tablet Take 100 mg by mouth daily.     No current facility-administered medications for this encounter.     Physical Findings: The patient is in no acute distress. Patient is alert and oriented.  No palpable cervical, supraclavicular or axillary lymphoadenopathy. The heart has a regular rate and rhythm. The lungs are clear to auscultation. Abdomen soft and non-tender.  Skin rash much better  On pelvic examination the external genitalia were unremarkable. A speculum exam was performed. The vaginal cuff was well healed, with no signs of separation . On bimanual exam, no pelvic masses appreciated, vaginal cuff is intact.   Lab Findings: Lab Results  Component Value Date   WBC 13.2 (H) 04/14/2016   HGB 9.4 (L) 04/14/2016   HCT 29.8 (L) 04/14/2016   MCV 88.2 04/14/2016   PLT 288 04/14/2016  Radiographic Findings: No results found.  Impression: Stage IB grade 2 endometrioid endometrial adenocarcinoma with high/intermediate risk factors and microsatellite instability on routine tumor testing. The patient is healed since her surgery.   Plan: The patient will proceed with planning and treatment later today. _______________________________   Blair Promise, PhD, MD  This document serves as a record of services personally performed by Gery Pray, MD. It was created on his behalf by Maryla Morrow, a trained medical scribe.  The creation of this record is based on the scribe's personal observations and the provider's statements to them. This document has been checked and approved by the attending provider.

## 2016-06-15 NOTE — Progress Notes (Signed)
  Radiation Oncology         (336) [phone] ________________________________  Name: Susan Haney MRN: [id number]  Date: 06/15/2016  DOB: 02-14-1932  CC: Octavio Graves, DO  Everitt Amber, MD  HDR BRACHYTHERAPY NOTE  DIAGNOSIS: Stage IB grade 2 endometrioid endometrial adenocarcinoma with high/intermediate risk factors and microsatellite instability on routine tumor testing.   Simple treatment device note: Patient had construction of her custom vaginal cylinder. She will be treated with a 3.0 cm diameter segmented cylinder. This conforms to her anatomy without undue discomfort.  Vaginal brachytherapy procedure node: The patient was brought to the [facility]. Identity was confirmed. All relevant records and images related to the planned course of therapy were reviewed. The patient freely provided informed written consent to proceed with treatment after reviewing the details related to the planned course of therapy. The consent form was witnessed and verified by the simulation staff. Then, the patient was set-up in a stable reproducible supine position for radiation therapy. The patient's custom vaginal cylinder was placed in the proximal vagina. This was affixed to the CT/MR stabilization plate to prevent slippage. Patient tolerated the placement well.  Verification simulation note:  A fiducial marker was placed within the vaginal cylinder. An AP and lateral film was then obtained through the pelvis area. This documented accurate position of the vaginal cylinder for treatment.  HDR BRACHYTHERAPY TREATMENT  The remote afterloading device was affixed to the vaginal cylinder by catheter. Patient then proceeded to undergo her first high-dose-rate treatment directed at the proximal vagina. The patient was prescribed a dose of 6 gray to be delivered to the mucosal surface. Treatment length was 4.0 cm. Patient was treated with 1 channel using multiple dwell positions. Treatment time was 300.3 seconds.  Iridium 192 was the high-dose-rate source for treatment. The patient tolerated the treatment well. After completion of her therapy, a radiation survey was performed documenting return of the iridium source into the [employer] safe.   PLAN: Patient will return next week for her second high-dose-rate treatment.  -----------------------------------  Blair Promise, PhD, MD  This document serves as a record of services personally performed by Gery Pray, MD. It was created on his behalf by Maryla Morrow, a trained medical scribe. The creation of this record is based on the scribe's personal observations and the provider's statements to them. This document has been checked and approved by the attending provider.

## 2016-06-15 NOTE — Progress Notes (Signed)
Helaine Chess here for follow up/HDR.  She continues to report having pain in her lower back and left knee.  She denies having any bladder or bowel issues.  She also denies having any vaginal bleeding.  She saw Dr. Fermin Schwab on 06/07/16 and was told to stop the premarin cream.  She reports her skin irritation is better but still has some itching.  She is using the antifungal powder.  She said the lotrimin cream gave her bumps.  She has started using Cortizone cream.  BP 138/89 (BP Location: Left Arm, Patient Position: Sitting)   Pulse 83   Temp 98.4 F (36.9 C) (Oral)   Ht 5\' 3"  (1.6 m)   Wt 198 lb 6.4 oz (90 kg)   SpO2 99%   BMI 35.14 kg/m    Wt Readings from Last 3 Encounters:  06/15/16 198 lb 6.4 oz (90 kg)  06/07/16 201 lb 4.8 oz (91.3 kg)  05/27/16 199 lb 6.4 oz (90.4 kg)

## 2016-06-15 NOTE — Addendum Note (Signed)
Encounter addended by: Jacqulyn Liner, RN on: 06/15/2016 11:25 AM<BR>    Actions taken: Charge Capture section accepted

## 2016-06-15 NOTE — Progress Notes (Signed)
  Radiation Oncology         (336) [phone] ________________________________  Name: Susan Haney MRN: [id number]  Date: 06/15/2016  DOB: March 27, 1932  SIMULATION AND TREATMENT PLANNING NOTE HDR BRACHYTHERAPY  DIAGNOSIS:  Stage IB grade 2 endometrioid endometrial adenocarcinoma with high/intermediate risk factors and microsatellite instability on routine tumor testing.   NARRATIVE:  The patient was brought to the [facility].  Identity was confirmed.  All relevant records and images related to the planned course of therapy were reviewed.  The patient freely provided informed written consent to proceed with treatment after reviewing the details related to the planned course of therapy. The consent form was witnessed and verified by the simulation staff.  Then, the patient was set-up in a stable reproducible  supine position for radiation therapy.  CT images were obtained.  Surface markings were placed.  The CT images were loaded into the planning software.  Then the target and avoidance structures were contoured.  Treatment planning then occurred.  The radiation prescription was entered and confirmed.   I have requested : Brachytherapy Isodose Plan and Dosimetry Calculations to plan the radiation distribution.    PLAN:  The patient will receive 30 Gy in 5 fractions. Prescription will be to the vaginal mucosal surface, 6 gray per fraction. Treatment length will be 4 cm. Iridium 192 will be the high-dose-rate source.  -----------------------------------  Blair Promise, PhD, MD  This document serves as a record of services personally performed by Gery Pray, MD. It was created on his behalf by Maryla Morrow, a trained medical scribe. The creation of this record is based on the scribe's personal observations and the provider's statements to them. This document has been checked and approved by the attending provider.

## 2016-06-16 ENCOUNTER — Encounter: Payer: Self-pay | Admitting: Radiation Oncology

## 2016-06-21 ENCOUNTER — Telehealth: Payer: Self-pay | Admitting: *Deleted

## 2016-06-21 NOTE — Telephone Encounter (Signed)
Called patient to remind of HDR Tx. For 06-22-16 @ 9 am, lvm for a return call

## 2016-06-22 ENCOUNTER — Ambulatory Visit: Payer: Medicare Other | Admitting: Radiation Oncology

## 2016-06-22 ENCOUNTER — Ambulatory Visit
Admission: RE | Admit: 2016-06-22 | Discharge: 2016-06-22 | Disposition: A | Discharge status: Home / Self Care | Payer: Medicare Other | Source: Ambulatory Visit | Attending: Radiation Oncology | Admitting: Radiation Oncology

## 2016-06-22 DIAGNOSIS — C541 Malignant neoplasm of endometrium: Secondary | ICD-10-CM

## 2016-06-22 DIAGNOSIS — Z510 Encounter for antineoplastic radiation therapy: Secondary | ICD-10-CM | POA: Diagnosis not present

## 2016-06-22 NOTE — Progress Notes (Signed)
  Radiation Oncology         [phone] ________________________________  Name: Susan Haney MRN: [id number]  Date: 06/22/2016  DOB: [birth year]  CC: Octavio Graves, DO  Everitt Amber, MD  HDR BRACHYTHERAPY NOTE  DIAGNOSIS: Stage IB grade 2 endometrioid endometrial adenocarcinoma with high/intermediate risk factors and microsatellite instability on routine tumor testing.   Simple treatment device note: Patient had construction of her custom vaginal cylinder. She will be treated with a 3.0 cm diameter segmented cylinder. This conforms to her anatomy without undue discomfort.  Vaginal brachytherapy procedure node: The patient was brought to the [place] suite. Identity was confirmed. All relevant records and images related to the planned course of therapy were reviewed. The patient freely provided informed written consent to proceed with treatment after reviewing the details related to the planned course of therapy. The consent form was witnessed and verified by the simulation staff. Then, the patient was set-up in a stable reproducible supine position for radiation therapy. Pelvic exam revealed the vaginal cuff to be intact no pelvic masses palpable. The patient's custom vaginal cylinder was placed in the proximal vagina. This was affixed to the CT/MR stabilization plate to prevent slippage. Patient tolerated the placement well.  Verification simulation note:  A fiducial marker was placed within the vaginal cylinder. An AP and lateral film was then obtained through the pelvis area. This documented accurate position of the vaginal cylinder for treatment.  HDR BRACHYTHERAPY TREATMENT  The remote afterloading device was affixed to the vaginal cylinder by catheter. Patient then proceeded to undergo her second high-dose-rate treatment directed at the proximal vagina. The patient was prescribed a dose of 6 gray to be delivered to the mucosal surface. Treatment length was 4.0 cm. Patient was treated with 1  channel using multiple dwell positions. Treatment time was 320.7 seconds. Iridium 192 was the high-dose-rate source for treatment. The patient tolerated the treatment well. After completion of her therapy, a radiation survey was performed documenting return of the iridium source into the GammaMed safe.   PLAN: Patient will return next week for her third high-dose-rate treatment.  -----------------------------------  Blair Promise, PhD, MD  This document serves as a record of services personally performed by Gery Pray, MD. It was created on his behalf by Maryla Morrow, a trained medical scribe. The creation of this record is based on the scribe's personal observations and the provider's statements to them. This document has been checked and approved by the attending provider.

## 2016-06-23 ENCOUNTER — Encounter: Payer: Self-pay | Admitting: Radiation Oncology

## 2016-06-28 ENCOUNTER — Telehealth: Payer: Self-pay | Admitting: *Deleted

## 2016-06-28 NOTE — Telephone Encounter (Signed)
Called patient to remind of HDR Tx. For 06-29-16 @ 1 pm, spoke with patient and she is aware of this tx.

## 2016-06-29 ENCOUNTER — Ambulatory Visit
Admission: RE | Admit: 2016-06-29 | Discharge: 2016-06-29 | Disposition: A | Discharge status: Home / Self Care | Payer: Medicare Other | Source: Ambulatory Visit | Attending: Radiation Oncology | Admitting: Radiation Oncology

## 2016-06-29 ENCOUNTER — Ambulatory Visit (INDEPENDENT_AMBULATORY_CARE_PROVIDER_SITE_OTHER): Payer: Medicare Other | Admitting: Internal Medicine

## 2016-06-29 DIAGNOSIS — C541 Malignant neoplasm of endometrium: Secondary | ICD-10-CM

## 2016-06-29 DIAGNOSIS — Z510 Encounter for antineoplastic radiation therapy: Secondary | ICD-10-CM | POA: Diagnosis not present

## 2016-06-29 NOTE — Progress Notes (Signed)
  Radiation Oncology         [phone] ________________________________  Name: Susan Haney MRN: [id number]  Date: 06/29/2016  DOB: Jun 12, 1932  CC: Octavio Graves, DO  Everitt Amber, MD  HDR BRACHYTHERAPY NOTE  DIAGNOSIS: Stage IB grade 2 endometrioid endometrial adenocarcinoma with high/intermediate risk factors and microsatellite instability on routine tumor testing.   Simple treatment device note: Patient had construction of her custom vaginal cylinder. She will be treated with a 3.0 cm diameter segmented cylinder. This conforms to her anatomy without undue discomfort.  Vaginal brachytherapy procedure node: The patient was brought to the [place] suite. Identity was confirmed. All relevant records and images related to the planned course of therapy were reviewed. The patient freely provided informed written consent to proceed with treatment after reviewing the details related to the planned course of therapy. The consent form was witnessed and verified by the simulation staff. Then, the patient was set-up in a stable reproducible supine position for radiation therapy. Pelvic exam revealed the vaginal cuff to be intact no pelvic masses palpable. The patient's custom vaginal cylinder was placed in the proximal vagina. This was affixed to the CT/MR stabilization plate to prevent slippage. Patient tolerated the placement well.  Verification simulation note:  A fiducial marker was placed within the vaginal cylinder. An AP and lateral film was then obtained through the pelvis area. This documented accurate position of the vaginal cylinder for treatment.  HDR BRACHYTHERAPY TREATMENT  The remote afterloading device was affixed to the vaginal cylinder by catheter. Patient then proceeded to undergo her third high-dose-rate treatment directed at the proximal vagina. The patient was prescribed a dose of 6 gray to be delivered to the mucosal surface. Treatment length was 4.0 cm. Patient was treated with 1  channel using multiple dwell positions. Treatment time was 342.6 seconds. Iridium 192 was the high-dose-rate source for treatment. The patient tolerated the treatment well. After completion of her therapy, a radiation survey was performed documenting return of the iridium source into the Gamma[employer] safe.   PLAN: Patient will return soon for her fourth high-dose-rate treatment.  -----------------------------------  Blair Promise, PhD, MD  This document serves as a record of services personally performed by Gery Pray, MD. It was created on his behalf by Maryla Morrow, a trained medical scribe. The creation of this record is based on the scribe's personal observations and the provider's statements to them. This document has been checked and approved by the attending provider.

## 2016-06-30 ENCOUNTER — Telehealth: Payer: Self-pay | Admitting: *Deleted

## 2016-06-30 NOTE — Telephone Encounter (Signed)
Called patient to remind of HDR Tx. For 07-01-16 @ 1 pm, spoke with patient and she is aware of this tx.

## 2016-07-01 ENCOUNTER — Ambulatory Visit
Admission: RE | Admit: 2016-07-01 | Discharge: 2016-07-01 | Disposition: A | Discharge status: Home / Self Care | Payer: Medicare Other | Source: Ambulatory Visit | Attending: Radiation Oncology | Admitting: Radiation Oncology

## 2016-07-01 DIAGNOSIS — Z510 Encounter for antineoplastic radiation therapy: Secondary | ICD-10-CM | POA: Diagnosis not present

## 2016-07-01 DIAGNOSIS — C541 Malignant neoplasm of endometrium: Secondary | ICD-10-CM

## 2016-07-01 MED ORDER — HYDROMORPHONE HCL 1 MG/ML IJ SOLN
0.5000 mg | Freq: Once | INTRAMUSCULAR | Status: DC
  Filled 2016-07-01: qty 1

## 2016-07-01 NOTE — Progress Notes (Signed)
  Radiation Oncology         [phone] ________________________________  Name: Susan Haney MRN: [id number]  Date: 07/01/2016  DOB: [birth year]  CC: Octavio Graves, DO  Everitt Amber, MD  HDR BRACHYTHERAPY NOTE  DIAGNOSIS: Stage IB grade 2 endometrioid endometrial adenocarcinoma with high/intermediate risk factors and microsatellite instability on routine tumor testing.   Simple treatment device note: Patient had construction of her custom vaginal cylinder. She will be treated with a 3.0 cm diameter segmented cylinder. This conforms to her anatomy without undue discomfort.  Vaginal brachytherapy procedure node: The patient was brought to the [place] suite. Identity was confirmed. All relevant records and images related to the planned course of therapy were reviewed. The patient freely provided informed written consent to proceed with treatment after reviewing the details related to the planned course of therapy. The consent form was witnessed and verified by the simulation staff. Then, the patient was set-up in a stable reproducible supine position for radiation therapy. Pelvic exam revealed the vaginal cuff to be intact no pelvic masses palpable. The patient's custom vaginal cylinder was placed in the proximal vagina. This was affixed to the CT/MR stabilization plate to prevent slippage. Patient tolerated the placement well.  Verification simulation note:  A fiducial marker was placed within the vaginal cylinder. An AP and lateral film was then obtained through the pelvis area. This documented accurate position of the vaginal cylinder for treatment.  HDR BRACHYTHERAPY TREATMENT  The remote afterloading device was affixed to the vaginal cylinder by catheter. Patient then proceeded to undergo her fourth high-dose-rate treatment directed at the proximal vagina. The patient was prescribed a dose of 6 gray to be delivered to the mucosal surface. Treatment length was 4.0 cm. Patient was treated with  1 channel using multiple dwell positions. Treatment time was 349.0 seconds. Iridium 192 was the high-dose-rate source for treatment. The patient tolerated the treatment well. After completion of her therapy, a radiation survey was performed documenting return of the iridium source into the GammaMed safe.   PLAN: Patient will return soon for her fifth high-dose-rate treatment.  -----------------------------------  Blair Promise, PhD, MD  This document serves as a record of services personally performed by Gery Pray, MD. It was created on his behalf by Bethann Humble, a trained medical scribe. The creation of this record is based on the scribe's personal observations and the provider's statements to them. This document has been checked and approved by the attending provider.

## 2016-07-05 ENCOUNTER — Telehealth: Payer: Self-pay | Admitting: *Deleted

## 2016-07-05 NOTE — Telephone Encounter (Signed)
CALLED PATIENT TO REMIND OF HDR TX. ON 07-06-16 @ 1 PM, LVM FOR A RETURN CALL

## 2016-07-06 ENCOUNTER — Encounter: Payer: Self-pay | Admitting: Radiation Oncology

## 2016-07-06 ENCOUNTER — Ambulatory Visit
Admission: RE | Admit: 2016-07-06 | Discharge: 2016-07-06 | Disposition: A | Discharge status: Home / Self Care | Payer: Medicare Other | Source: Ambulatory Visit | Attending: Radiation Oncology | Admitting: Radiation Oncology

## 2016-07-06 DIAGNOSIS — C541 Malignant neoplasm of endometrium: Secondary | ICD-10-CM

## 2016-07-06 DIAGNOSIS — Z510 Encounter for antineoplastic radiation therapy: Secondary | ICD-10-CM | POA: Diagnosis not present

## 2016-07-06 NOTE — Progress Notes (Signed)
  Radiation Oncology         [phone] ________________________________  Name: Susan Haney MRN: [id number]  Date: 07/06/2016  DOB: 12/02/1931  End of Treatment Note  Diagnosis:   Stage IB grade 2 endometrioid endometrial adenocarcinoma with high/intermediate risk factors and microsatellite instability on routine tumor testing     Indication for treatment:  Post-op, reduce the chance of vaginal cuff recurrence  Radiation treatment dates:   06/15/16, 06/22/16, 06/29/16, 07/01/16, 07/06/16  Site/dose:  Vaginal Cuff: 30 Gy in 5 fractions   Beams/energy:   HDR-Vaginal Brachytherapy with Iridium 192 as the high dose rate source. 3 cm diameter cylinder. Treatment length of 4 cm.  Narrative: The patient tolerated radiation treatment relatively well. The patient's treatment was initially delayed due to not being completely healed since surgery with slight separation of the vaginal cuff. The patient also had a yeast infection in the inguinal area and skin folds of the lower abdomen. The patient also had some mild bleeding and erythematous raised lesions on the abdomen also appearing fungal in nature. The patient was placed on a vaginal estrogen cream to aide in healing and was given antifungal powder for the infection.  The patient denies bladder or bowel complaints. Denied vaginal bleeding. Reported lower back and knee pain.  Plan: The patient has completed radiation treatment. The patient will return to radiation oncology clinic for routine followup in one month. I advised them to call or return sooner if they have any questions or concerns related to their recovery or treatment.  -----------------------------------  Blair Promise, PhD, MD  This document serves as a record of services personally performed by Gery Pray, MD. It was created on his behalf by Darcus Austin, a trained medical scribe. The creation of this record is based on the scribe's personal observations and the provider's  statements to them. This document has been checked and approved by the attending provider.

## 2016-07-06 NOTE — Progress Notes (Signed)
  Radiation Oncology         [phone] ________________________________  Name: Susan Haney MRN: [id number]  Date: 07/06/2016  DOB: Aug 22, 1931  CC: Octavio Graves, DO  Everitt Amber, MD  HDR BRACHYTHERAPY NOTE  DIAGNOSIS: Stage IB grade 2 endometrioid endometrial adenocarcinoma with high/intermediate risk factors and microsatellite instability on routine tumor testing.   Simple treatment device note: Patient had construction of her custom vaginal cylinder. She will be treated with a 3.0 cm diameter segmented cylinder. This conforms to her anatomy without undue discomfort.  Vaginal brachytherapy procedure node: The patient was brought to the [place] suite. Identity was confirmed. All relevant records and images related to the planned course of therapy were reviewed. The patient freely provided informed written consent to proceed with treatment after reviewing the details related to the planned course of therapy. The consent form was witnessed and verified by the simulation staff. Then, the patient was set-up in a stable reproducible supine position for radiation therapy. Pelvic exam revealed the vaginal cuff to be intact no pelvic masses palpable. The patient's custom vaginal cylinder was placed in the proximal vagina. This was affixed to the CT/MR stabilization plate to prevent slippage. Patient tolerated the placement well.  Verification simulation note:  A fiducial marker was placed within the vaginal cylinder. An AP and lateral film was then obtained through the pelvis area. This documented accurate position of the vaginal cylinder for treatment.  HDR BRACHYTHERAPY TREATMENT  The remote afterloading device was affixed to the vaginal cylinder by catheter. Patient then proceeded to undergo her fifth high-dose-rate treatment directed at the proximal vagina. The patient was prescribed a dose of 6 gray to be delivered to the mucosal surface. Treatment length was 4.0 cm. Patient was treated with 1  channel using 9 dwell positions. Treatment time was 365.8 seconds. Iridium 192 was the high-dose-rate source for treatment. The patient tolerated the treatment well. After completion of her therapy, a radiation survey was performed documenting return of the iridium source into the GammaMed safe.   PLAN: The patient has completed HDR brachytherapy treatment and will return in 1 month for a follow up.  -----------------------------------  Blair Promise, PhD, MD  This document serves as a record of services personally performed by Gery Pray, MD. It was created on his behalf by Darcus Austin, a trained medical scribe. The creation of this record is based on the scribe's personal observations and the provider's statements to them. This document has been checked and approved by the attending provider.

## 2016-08-18 ENCOUNTER — Encounter: Payer: Self-pay | Admitting: Oncology

## 2016-08-19 ENCOUNTER — Ambulatory Visit
Admission: RE | Admit: 2016-08-19 | Discharge: 2016-08-19 | Disposition: A | Discharge status: Home / Self Care | Payer: Medicare Other | Source: Ambulatory Visit | Attending: Radiation Oncology | Admitting: Radiation Oncology

## 2016-08-19 VITALS — BP 145/100 | HR 74 | Temp 97.9°F | Resp 20 | Wt 200.2 lb

## 2016-08-19 DIAGNOSIS — Z7984 Long term (current) use of oral hypoglycemic drugs: Secondary | ICD-10-CM | POA: Diagnosis not present

## 2016-08-19 DIAGNOSIS — M7989 Other specified soft tissue disorders: Secondary | ICD-10-CM | POA: Insufficient documentation

## 2016-08-19 DIAGNOSIS — C541 Malignant neoplasm of endometrium: Secondary | ICD-10-CM | POA: Insufficient documentation

## 2016-08-19 DIAGNOSIS — K5900 Constipation, unspecified: Secondary | ICD-10-CM | POA: Diagnosis not present

## 2016-08-19 DIAGNOSIS — Z7982 Long term (current) use of aspirin: Secondary | ICD-10-CM | POA: Insufficient documentation

## 2016-08-19 DIAGNOSIS — Z79899 Other long term (current) drug therapy: Secondary | ICD-10-CM | POA: Insufficient documentation

## 2016-08-19 DIAGNOSIS — R300 Dysuria: Secondary | ICD-10-CM | POA: Insufficient documentation

## 2016-08-19 DIAGNOSIS — M79605 Pain in left leg: Secondary | ICD-10-CM | POA: Insufficient documentation

## 2016-08-19 LAB — URINALYSIS, MICROSCOPIC - CHCC
BILIRUBIN (URINE): NEGATIVE
Glucose: NEGATIVE mg/dL
Ketones: NEGATIVE mg/dL
Leukocyte Esterase: NEGATIVE
NITRITE: NEGATIVE
PH: 5 (ref 4.6–8.0)
Protein: NEGATIVE mg/dL
Specific Gravity, Urine: 1.025 (ref 1.003–1.035)
Urobilinogen, UR: 0.2 mg/dL (ref 0.2–1)

## 2016-08-19 NOTE — Progress Notes (Signed)
Susan Haney is here for follow up.  She reports having pain and swelling in her left leg.  She reports having a recent doppler that was normal.  She reports having occasional dysuria that started a few weeks after radiation.  She denies having hematuria.  She reports having some constipation.  She denies having any vaginal bleeding.  She reports having a good appetite and energy level.   BP (!) 145/100 (BP Location: Left Arm, Patient Position: Sitting)   Pulse 74   Temp 97.9 F (36.6 C) (Oral)   Resp 20   Wt 200 lb 3.2 oz (90.8 kg)   SpO2 99%   BMI 35.46 kg/m    Wt Readings from Last 3 Encounters:  08/19/16 200 lb 3.2 oz (90.8 kg)  06/15/16 198 lb 6.4 oz (90 kg)  06/07/16 201 lb 4.8 oz (91.3 kg)

## 2016-08-19 NOTE — Progress Notes (Signed)
  Home Care Instructions for the Insertion and Care of Your Vaginal Dilator  Why Do I Need a Vaginal Dilator?  Internal radiation therapy may cause scar tissue to form at the top of your vagina (vaginal cuff).  This may make vaginal examinations difficult in the future. You can prevent scar tissue from forming by using a vaginal dilator (a smooth plastic rod), and/or by having regular sexual intercourse.  If not using the dilator you should be having intercourse two or three times a week.  If you are unable to have intercourse, you should use your vaginal dilator.  You may have some spotting or bleeding from your dilator or intercourse the first few times. You may also have some discomfort. If discomfort occurs with intercourse, you and your partner may need to stop for a while and try again later.  How to Use Your Vaginal Dilator  - Use the dilator 3 times a week for 10 minutes at a time (for example: Monday, Wednesday and Friday evenings). - Wash the dilator with soap and water before and after each use. - Check the dilator to be sure it is smooth. Do not use the dilator if you find any roughspots. - Coat the dilator with K-Y Jelly, Astroglide, or Replens. Do not use Vaseline, baby oil, or other oil based lubricants. They are not water-soluble and can be irritating to the tissues in the vagina. - Lie on your back with your knees bent and legs apart. - Insert the rounded end of the dilator into your vagina as far as it will go without causing pain or discomfort. - Close your knees and slowly straighten your legs. - Keep the dilator in your vagina for about 10 to 15 minutes. [facility] your knees, open your legs, and gently remove the dilator. - Gently cleanse the skin around the vaginal opening. - Wash the dilator after each use. -  It is important that you use the dilator routinely until instructed otherwise by your doctor.

## 2016-08-19 NOTE — Progress Notes (Signed)
Radiation Oncology         [phone] ________________________________  Name: Susan Haney MRN: [id number]  Date: 08/19/2016  DOB: Oct 25, 1931  Follow-Up Visit Note  CC: CYNTHIA BUTLER, DO  Everitt Amber, MD    ICD-9-CM ICD-10-CM   1. Endometrial cancer ([place]) 182.0 C54.1 Urinalysis, Microscopic - [facility]     Urine culture    Diagnosis:  Stage IB grade 2 endometrioid endometrial adenocarcinoma with high/intermediate risk factors and microsatellite instability on routine tumor testing   Interval Since Last Radiation:  6 weeks 06/15/16-07/06/16 30 Gy in 5 fractions to the vaginal cuff  Narrative:  The patient returns today for routine follow-up.  She reports pain and swelling in her left leg. She reports having a recent doppler at [facility] that was normal. She notes occasional dysuria that began a few weeks after radiation. She denies hematuria, or vaginal bleeding. She is positive for constipation. She notes a good appetite and energy level.                          ALLERGIES:  has No Known Allergies.  Meds: Current Outpatient Prescriptions  Medication Sig Dispense Refill  . aspirin EC 81 MG tablet Take 81 mg by mouth daily.    . cholecalciferol (VITAMIN D) 400 UNITS TABS tablet Take 2,000 Units by mouth.    . docusate sodium (COLACE) 100 MG capsule Take 100 mg by mouth 2 (two) times daily.    Marland Kitchen lisinopril-hydrochlorothiazide (PRINZIDE,ZESTORETIC) 10-12.5 MG tablet Take 1 tablet by mouth daily.    . metFORMIN (GLUMETZA) 500 MG (MOD) 24 hr tablet Take 500 mg by mouth every evening.    . Multiple Vitamins-Minerals (CENTRUM SILVER ADULT 50+) TABS Take 1 tablet by mouth daily.     . naproxen (NAPROSYN) 500 MG tablet Take 500 mg by mouth 2 (two) times daily with a meal.    . Omega-3 Fatty Acids (OMEGA 3 500 PO) Take 4 tablets by mouth.     Marland Kitchen omeprazole (PRILOSEC) 20 MG capsule TAKE 1 CAPSULE (20 MG TOTAL) BY MOUTH DAILY. 90 capsule 3  . Probiotic Product (PHILLIPS COLON HEALTH  PO) Take 1 tablet by mouth daily.     . sitaGLIPtin (JANUVIA) 100 MG tablet Take 100 mg by mouth daily.    Marland Kitchen conjugated estrogens (PREMARIN) vaginal cream Place 1 Applicatorful vaginally daily. Please give applicator as well (Patient not taking: Reported on 06/15/2016) 42.5 g 1  . ferrous sulfate 325 (65 FE) MG tablet Take 1 tablet (325 mg total) by mouth 2 (two) times daily with a meal. (Patient not taking: Reported on 08/19/2016)  3  . HYDROcodone-acetaminophen (NORCO/VICODIN) 5-325 MG tablet     . losartan-hydrochlorothiazide (HYZAAR) 100-12.5 MG tablet Take 1 tablet by mouth daily.  3  . polyethylene glycol (MIRALAX / GLYCOLAX) packet Take 17 g by mouth daily as needed for moderate constipation. (Patient not taking: Reported on 08/19/2016)  0   No current facility-administered medications for this encounter.     Physical Findings: The patient is in no acute distress. Patient is alert and oriented.  weight is 200 lb 3.2 oz (90.8 kg). Her oral temperature is 97.9 F (36.6 C). Her blood pressure is 145/100 (abnormal) and her pulse is 74. Her respiration is 20 and oxygen saturation is 99%.  No significant changes. Lungs are clear to auscultation bilaterally. Heart has regular rate and rhythm. No palpable cervical, supraclavicular, or axillary adenopathy. Abdomen soft, non-tender, normal  bowel sounds.   Lab Findings: Lab Results  Component Value Date   WBC 13.2 (H) 04/14/2016   HGB 9.4 (L) 04/14/2016   HCT 29.8 (L) 04/14/2016   MCV 88.2 04/14/2016   PLT 288 04/14/2016    Radiographic Findings: No results found.  Impression: The patient is doing well after her vaginal brachytherapy.  Plan:  Follow up in radiation oncology in 5 months. Follow up with Dr. Denman George in 2 months. Today the patient was given a vaginal dilatory and instructions of its use. In light of her urinary symptoms, we will send the patient up to the lab to check for urinary  infection.  ____________________________________   This document serves as a record of services personally performed by Gery Pray, MD. It was created on his behalf by Bethann Humble, a trained medical scribe. The creation of this record is based on the scribe's personal observations and the provider's statements to them. This document has been checked and approved by the attending provider.

## 2016-08-21 LAB — URINE CULTURE

## 2016-08-25 ENCOUNTER — Other Ambulatory Visit: Payer: Self-pay | Admitting: Radiation Oncology

## 2016-08-25 DIAGNOSIS — C541 Malignant neoplasm of endometrium: Secondary | ICD-10-CM

## 2016-08-26 ENCOUNTER — Other Ambulatory Visit: Payer: Self-pay | Admitting: Radiation Oncology

## 2016-08-26 ENCOUNTER — Telehealth: Payer: Self-pay | Admitting: Oncology

## 2016-08-26 DIAGNOSIS — C541 Malignant neoplasm of endometrium: Secondary | ICD-10-CM

## 2016-08-26 MED ORDER — NITROFURANTOIN MONOHYD MACRO 100 MG PO CAPS: 100.0000 mg | ORAL_CAPSULE | Freq: Two times a day (BID) | ORAL | 0 refills | Status: DC

## 2016-08-26 NOTE — Telephone Encounter (Signed)
Called Susan Haney and notifed her of her urine culture results and advised her that an antibiotic prescription has been sent to her pharmacy.  Susan Haney said that she is currently taking cefadroxil 500 mg BID for 14 days for a swollen knee and is wondering if she needs to take another one.  Dr. Sondra Come was notified and recommended just taking the cefadroxil and rechecking her urine 2 weeks after finishing the antibiotic.  Lab appointment scheduled for 09/13/16 per [facility] request.

## 2016-08-26 NOTE — Telephone Encounter (Signed)
Called [facility] pharmacy and canceled the prescription for Macrobid per Dr. Sondra Come.

## 2016-09-13 ENCOUNTER — Other Ambulatory Visit: Payer: Self-pay | Admitting: Oncology

## 2016-09-13 ENCOUNTER — Ambulatory Visit
Admission: RE | Admit: 2016-09-13 | Discharge: 2016-09-13 | Disposition: A | Discharge status: Home / Self Care | Payer: Medicare Other | Source: Ambulatory Visit | Attending: Radiation Oncology | Admitting: Radiation Oncology

## 2016-09-13 DIAGNOSIS — R300 Dysuria: Secondary | ICD-10-CM | POA: Diagnosis not present

## 2016-09-13 LAB — URINALYSIS, MICROSCOPIC - CHCC
BILIRUBIN (URINE): NEGATIVE
Blood: NEGATIVE
Glucose: NEGATIVE mg/dL
KETONES: NEGATIVE mg/dL
Leukocyte Esterase: NEGATIVE
Nitrite: NEGATIVE
PH: 6 (ref 4.6–8.0)
Protein: NEGATIVE mg/dL
RBC / HPF: NEGATIVE (ref 0–2)
SPECIFIC GRAVITY, URINE: 1.025 (ref 1.003–1.035)
Urobilinogen, UR: 0.2 mg/dL (ref 0.2–1)

## 2016-09-15 LAB — URINE CULTURE

## 2016-10-19 NOTE — Progress Notes (Signed)
Follow-up Note: Endometrial cancer   Susan Haney 83 y.o. female  Chief Complaint  Patient presents with  . Endometrial  history of endometrial cancer  Assessment : 83 year old woman with stage IB grade 2 endometrioid endometrial adenocarcinoma with high/intermediate risk factors and Microsatellite instability on routine tumor testing. s/p vaginal brachytherapy (completed December, 2017). No evidence for recurrence.  Plan:  I recommend she follow-up at 3 monthly intervals for symptom review, physical examination and pelvic examination. Pap smear is not recommended in routine endometrial cancer surveillance. After 2 years we will space these visits to every 6 months, and then annually if recurrence has not developed within 5 years. Next appointment is with Dr Sondra Come in July, I will plan on seeing her in October, 2018.  Re-refer to genetic testing.  All questions were answered.   HPI: The patient had onset of postmenopausal bleeding approximately a month ago. This was not  associated with any pain or other symptoms. An endometrial biopsy was obtained showing a grade 1-2 endometrial adenocarcinoma. Pelvic ultrasound showed the uterus to measure 7 x 4.4 x 5.6 cm with 2 small fibroids. The left ovary surgically absent right ovary appeared normal there is no free fluid.  Today patient is having some vaginal bleeding. She also notes some increased edema and pain in her left lower extremity below the knee.  The patient has a past history of a benign left ovarian cyst which was removed laparoscopically. The patient underwent menopause at age 17. Pap smears are normal  Obstetrical history gravida 2 para 2  Comorbidities include diabetes valvular heart disease osteoporosis lumbar disc disease and obesity.  Interval Hx: Her original surgery date had to be postponed when she acutely fell and fractured her left distal femur assoicated with a knee replacement on 02/12/16. She required emergent  repair and replacement and had been in rehab since that time.   On 04/13/16 we performed robotic assisted total hysterectomy, BSO, bilateral SLN biopsy. Final pathology revealed a 4.3cm FIGO grade 2 tumor with 10 of 48m myometrial invasion, negative LVSI and cervical stromal involvement. Bilateral SLN's were negative. 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 She cancelled her initial referall to genetics due to back pain.  She had some delayed cuff healing (without dehiscence).  She went on to receive vaginal brachytherapy in accordance with NCCN guidelines: 06/15/16-07/06/16 30 Gy in 5 fractions to the vaginal cuff  She has no vaginal bleeding or symptoms concerning for recurrence.  Review of Systems:10 point review of systems is negative except as noted in interval history.   Vitals: Blood pressure (!) 158/66, pulse 73, temperature 98.3 F (36.8 C), temperature source Oral, resp. rate 20, weight 200 lb 4.8 oz (90.9 kg).  Physical Exam: General : The patient is a healthy woman in no acute distress.  HEENT: normocephalic, extraoccular movements normal; neck is supple without thyromegally  Lynphnodes: Supraclavicular and inguinal nodes not enlarged  Abdomen: Soft, non-tender, no ascites, no organomegally, no masses, no hernias. Incisions well healed. Pelvic:  EGBUS: Normal female (atrophic) Vagina: Normal, atrophic, no lesions. Cuff healing normally and in tact with no blood. Urethra and Bladder: Normal, non-tender  Cervix & uterus surgically absent. Bi-manual examination: cuff in tact. Rectal: normal sphincter tone, no masses, no blood  Lower extremities: incision healing normally on left leg/knee with no signs of infection. Can flex to 110 degrees.      No Known Allergies  Past Medical History:  Diagnosis Date  .  Anemia    history ,  took iron for years  . Arthritis   . Bicuspid aortic valve   . Cancer [facility])    endometrial cancer  . Chronic back  pain   . Diabetes ([place])    x 15 yrs with good control  . Endometrial cancer ([place])   . GERD (gastroesophageal reflux disease)   . History of radiation therapy 06/15/16-07/06/16   vaginal cuff 30 Gy in 5 fractions  . Hypertension     Past Surgical History:  Procedure Laterality Date  . BUNIONECTOMY    . COLONOSCOPY N/A 04/04/2014   Procedure: COLONOSCOPY;  Surgeon: Rogene Houston, MD;  [facility];  Service: Endoscopy;  Laterality: N/A;  100  . EYE SURGERY     bil cataracts and lens implants  . FOOT SURGERY     Left  . Ovary removed: benign    . REPLACEMENT TOTAL KNEE BILATERAL     2013  . ROBOTIC ASSISTED TOTAL HYSTERECTOMY WITH BILATERAL SALPINGO OOPHERECTOMY Left 04/13/2016   Procedure: XI ROBOTIC ASSISTED TOTAL HYSTERECTOMY WITH LEFT SALPINGO OOPHORECTOMY SENTINEL LYMPH NODE BIOPSY;  Surgeon: Everitt Amber, MD;  Location: [facility];  Service: Gynecology;  Laterality: Left;  . TOTAL KNEE REVISION Left 02/12/2016   Procedure: LEFT TOTAL KNEE REVISION;  Surgeon: Rod Can, MD;  Location: [facility];  Service: Orthopedics;  Laterality: Left;    Current Outpatient Prescriptions  Medication Sig Dispense Refill  . aspirin EC 81 MG tablet Take 81 mg by mouth daily.    . cholecalciferol (VITAMIN D) 400 UNITS TABS tablet Take 2,000 Units by mouth.    . docusate sodium (COLACE) 100 MG capsule Take 100 mg by mouth 2 (two) times daily.    Marland Kitchen lisinopril-hydrochlorothiazide (PRINZIDE,ZESTORETIC) 10-12.5 MG tablet Take 1 tablet by mouth daily.    . metFORMIN (GLUMETZA) 500 MG (MOD) 24 hr tablet Take 500 mg by mouth every evening.    . Multiple Vitamins-Minerals (CENTRUM SILVER ADULT 50+) TABS Take 1 tablet by mouth daily.     . naproxen (NAPROSYN) 500 MG tablet Take 500 mg by mouth 2 (two) times daily with a meal.    . Omega-3 Fatty Acids (OMEGA 3 500 PO) Take 4 tablets by mouth.     Marland Kitchen omeprazole (PRILOSEC) 20 MG capsule TAKE 1 CAPSULE (20 MG TOTAL) BY MOUTH DAILY. 90 capsule 3  .  polyethylene glycol (MIRALAX / GLYCOLAX) packet Take 17 g by mouth daily as needed for moderate constipation.  0  . Probiotic Product (PHILLIPS COLON HEALTH PO) Take 1 tablet by mouth daily.     . sitaGLIPtin (JANUVIA) 100 MG tablet Take 100 mg by mouth daily.     No current facility-administered medications for this visit.     Social History   Social History  . Marital status: Widowed    Spouse name: N/A  . Number of children: 2  . Years of education: N/A   Occupational History  . Not on file.   Social History Main Topics  . Smoking status: Never Smoker  . Smokeless tobacco: Never Used  . Alcohol use No  . Drug use: No  . Sexual activity: Not Currently   Other Topics Concern  . Not on file   Social History Narrative  . No narrative on file    Family History  Problem Relation Age of Onset  . Prostate cancer Father   . Diabetes Maternal Grandmother      30 minutes of direct face  to face counseling time was spent with the patient. This included discussion about prognosis, therapy recommendations and postoperative side effects and are beyond the scope of routine postoperative care.  Donaciano Eva, MD 10/20/2016, 2:58 PM  CC: Dr Octavio Graves

## 2016-10-20 ENCOUNTER — Encounter: Payer: Self-pay | Admitting: Gynecologic Oncology

## 2016-10-20 ENCOUNTER — Ambulatory Visit: Payer: Medicare Other | Attending: Gynecologic Oncology | Admitting: Gynecologic Oncology

## 2016-10-20 VITALS — BP 158/66 | HR 73 | Temp 98.3°F | Resp 20 | Wt 200.3 lb

## 2016-10-20 DIAGNOSIS — Z8542 Personal history of malignant neoplasm of other parts of uterus: Secondary | ICD-10-CM | POA: Diagnosis not present

## 2016-10-20 DIAGNOSIS — Z79899 Other long term (current) drug therapy: Secondary | ICD-10-CM | POA: Diagnosis not present

## 2016-10-20 DIAGNOSIS — C541 Malignant neoplasm of endometrium: Secondary | ICD-10-CM | POA: Insufficient documentation

## 2016-10-20 DIAGNOSIS — Z7984 Long term (current) use of oral hypoglycemic drugs: Secondary | ICD-10-CM | POA: Insufficient documentation

## 2016-10-20 DIAGNOSIS — E119 Type 2 diabetes mellitus without complications: Secondary | ICD-10-CM | POA: Diagnosis not present

## 2016-10-20 DIAGNOSIS — Z7982 Long term (current) use of aspirin: Secondary | ICD-10-CM | POA: Diagnosis not present

## 2016-10-20 DIAGNOSIS — R897 Abnormal histological findings in specimens from other organs, systems and tissues: Secondary | ICD-10-CM | POA: Diagnosis not present

## 2016-10-20 NOTE — Patient Instructions (Signed)
Keep appointment on July 12,2018 with Dr. Sondra Come as scheduled. Dr. Denman George wants to see you in October 2018.  Please call in July to schedule. [phone]. The Genetic counseling department will call you with an appointment.

## 2016-11-13 ENCOUNTER — Telehealth: Payer: Self-pay | Admitting: Genetic Counselor

## 2016-11-13 ENCOUNTER — Encounter: Payer: Self-pay | Admitting: Genetic Counselor

## 2016-11-13 NOTE — Telephone Encounter (Signed)
Appt has been scheduled for the pt to see Santiago Glad for genetic counseling on 5/14 at 10am. Pt agreed to the appt date and time. Demographics verified. Letter mailed.

## 2016-11-29 ENCOUNTER — Other Ambulatory Visit: Payer: Medicare Other

## 2016-11-29 ENCOUNTER — Ambulatory Visit (HOSPITAL_BASED_OUTPATIENT_CLINIC_OR_DEPARTMENT_OTHER): Payer: Medicare Other | Admitting: Genetic Counselor

## 2016-11-29 ENCOUNTER — Encounter: Payer: Self-pay | Admitting: Genetic Counselor

## 2016-11-29 DIAGNOSIS — Z8042 Family history of malignant neoplasm of prostate: Secondary | ICD-10-CM

## 2016-11-29 DIAGNOSIS — Z7183 Encounter for nonprocreative genetic counseling: Secondary | ICD-10-CM

## 2016-11-29 DIAGNOSIS — R897 Abnormal histological findings in specimens from other organs, systems and tissues: Secondary | ICD-10-CM

## 2016-11-29 DIAGNOSIS — Z809 Family history of malignant neoplasm, unspecified: Secondary | ICD-10-CM | POA: Diagnosis not present

## 2016-11-29 DIAGNOSIS — C541 Malignant neoplasm of endometrium: Secondary | ICD-10-CM

## 2016-11-29 NOTE — Progress Notes (Signed)
REFERRING PROVIDER: Everitt Amber, MD [place], [place] [address]  PRIMARY PROVIDER:  Octavio Graves, DO  PRIMARY REASON FOR VISIT:  1. 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   2. Family history of prostate cancer   3. Family history of cancer   4. Endometrial cancer ([place])      HISTORY OF PRESENT ILLNESS:   Susan Haney, a 83 y.o. female, was seen for a [facility] cancer genetics consultation at the request of Dr. Denman George due to a personal and family history of cancer.  Susan Haney presents to clinic today to discuss the possibility of a hereditary predisposition to cancer, genetic testing, and to further clarify her future cancer risks, as well as potential cancer risks for family members.   In 2017, at the age of 35, Susan Haney was diagnosed with cancer of the uterus.  This was treated with surgery and radiation.  MSI and IHC were performed to determine a risk for Lynch syndrome.  MSI was high and IHC found loss of MLH1 and PMS2.     CANCER HISTORY:   No history exists.     HORMONAL RISK FACTORS:  Menarche was at age 61.  First live birth at age 45.  OCP use for approximately <1 years.  Ovaries intact: no.  Hysterectomy: yes.  Menopausal status: postmenopausal.  HRT use: 2 years. Colonoscopy: yes; normal. Mammogram within the last year: no. Number of breast biopsies: 0. Up to date with pelvic exams:  yes. Any excessive radiation exposure in the past:  Just for treatment  Past Medical History:  Diagnosis Date  . Anemia    history ,  took iron for years  . Arthritis   . Bicuspid aortic valve   . Cancer [facility])    endometrial cancer  . Chronic back pain   . Diabetes ([place])    x 15 yrs with good control  . Endometrial cancer ([place])   . Family history of cancer   . Family history of prostate cancer   . GERD (gastroesophageal reflux disease)   . History of radiation therapy 06/15/16-07/06/16   vaginal cuff 30 Gy in 5 fractions  . Hypertension      Past Surgical History:  Procedure Laterality Date  . BUNIONECTOMY    . COLONOSCOPY N/A 04/04/2014   Procedure: COLONOSCOPY;  Surgeon: Rogene Houston, MD;  [facility];  Service: Endoscopy;  Laterality: N/A;  100  . EYE SURGERY     bil cataracts and lens implants  . FOOT SURGERY     Left  . Ovary removed: benign    . REPLACEMENT TOTAL KNEE BILATERAL     2013  . ROBOTIC ASSISTED TOTAL HYSTERECTOMY WITH BILATERAL SALPINGO OOPHERECTOMY Left 04/13/2016   Procedure: XI ROBOTIC ASSISTED TOTAL HYSTERECTOMY WITH LEFT SALPINGO OOPHORECTOMY SENTINEL LYMPH NODE BIOPSY;  Surgeon: Everitt Amber, MD;  [facility];  Service: Gynecology;  Laterality: Left;  . TOTAL KNEE REVISION Left 02/12/2016   Procedure: LEFT TOTAL KNEE REVISION;  Surgeon: Rod Can, MD;  [facility];  Service: Orthopedics;  Laterality: Left;    Social History   Social History  . Marital status: Widowed    Spouse name: N/A  . Number of children: 2  . Years of education: N/A   Social History Main Topics  . Smoking status: Never Smoker  . Smokeless tobacco: Never Used  . Alcohol use No  . Drug use: No  . Sexual activity: Not Currently   Other  Topics Concern  . Not on file   Social History Narrative  . No narrative on file     FAMILY HISTORY:  We obtained a detailed, 4-generation family history.  Significant diagnoses are listed below: Family History  Problem Relation Age of Onset  . Prostate cancer Father 44  . Diabetes Maternal Grandmother   . Heart attack Maternal Grandfather   . Cancer Sister        unknown primary  . Cancer Other 42       possibly pancreatic  . Cancer Other        possibly uterine or ovarian    The patient has 2 children who are cancer free.  She has three sisters and two brothers.  One sister has an unknown form of cancer and this sister's daughter had possible pancreatic cancer at 21.  One brother has a daughter who was recently diagnosed with a gynecological  cancer.  The patient's parents are deceased.  Her father had prostate cancer at 79 and her mother died from complications of diabetes.  There was no further cancer's reported in the family.  Susan Haney is unaware of previous family history of genetic testing for hereditary cancer risks. Patient's maternal ancestors are of Caucasian descent, and paternal ancestors are of Caucasian descent. There is no reported Ashkenazi Jewish ancestry. There is no known consanguinity.  GENETIC COUNSELING ASSESSMENT: Susan Haney is a 83 y.o. female with a personal history of endometrial cancer with IHC loss of MLH1 and PMS2 and a family history of cancer which is somewhat suggestive of a Lynch syndrome and predisposition to cancer. We, therefore, discussed and recommended the following at today's visit.   DISCUSSION: We discussed that about 5% of uterine cancer is thought to be hereditary.  Most commonly it is associated with Lynch syndrome.  Tumor testing was performed that showed MSI high and IHC loss of MLH1 and PMS2.  We discussed that this could be a sporadic tumor or it could be due to Lynch syndrome.  Based on the family history of young cancers, we decided to proceed with TumorNext Lynch with Cancernext testing.  We reviewed the characteristics, features and inheritance patterns of hereditary cancer syndromes. We also discussed genetic testing, including the appropriate family members to test, the process of testing, insurance coverage and turn-around-time for results. We discussed the implications of a negative, positive and/or variant of uncertain significant result. We recommended Susan Haney pursue genetic testing for the CancerNext gene panel. The CancerNext gene panel offered by [facility] [employer] includes sequencing and rearrangement analysis for the following 32 genes:   APC, ATM, BARD1, BMPR1A, BRCA1, BRCA2, BRIP1, CDH1, CDK4, CDKN2A, CHEK2, EPCAM, GREM1, MLH1, MRE11A, MSH2, MSH6, MUTYH, NBN, NF1, PALB2, PMS2,  POLD1, POLE, PTEN, RAD50, RAD51D, SMAD4, SMARCA4, STK11, and TP53.    Based on Susan Haney's personal and family history of cancer, she meets medical criteria for genetic testing. Despite that she meets criteria, she may still have an out of pocket cost. We discussed that if her out of pocket cost for testing is over $100, the laboratory will call and confirm whether she wants to proceed with testing.  If the out of pocket cost of testing is less than $100 she will be billed by the genetic testing laboratory.   We discussed that genetic testing through [facility] will test for hereditary mutations that could explain her diagnosis of cancer. However, TumorNext Lynch can test the uterine cancer tumor to determine if there is a Field seismologist  syndrome mutation that could be undetected, or more clearly determine that her cancer is sporadic.  TumorNext Lynch testing is performed in tandem with genetic testing, and typically at no additional cost.   PLAN: After considering the risks, benefits, and limitations, Susan Haney  provided informed consent to pursue genetic testing and the blood sample was sent to [facility] for analysis of the [place] with CancerNext. Results should be available within approximately 6-8 weeks' time, at which point they will be disclosed by telephone to Susan Haney, as will any additional recommendations warranted by these results. Susan Haney will receive a summary of her genetic counseling visit and a copy of her results once available. This information will also be available in Epic. We encouraged Susan Haney to remain in contact with cancer genetics annually so that we can continuously update the family history and inform her of any changes in cancer genetics and testing that may be of benefit for her family. Susan Haney questions were answered to her satisfaction today. Our contact information was provided should additional questions or concerns arise.  Based on Susan Haney's family  history, we recommended her niece Seth Bake, who was diagnosed with a gynecological cancer in her 63's, have genetic counseling and testing. Susan Haney will let us know if we can be of any assistance in coordinating genetic counseling and/or testing for this family member.   Lastly, we encouraged Susan Haney to remain in contact with cancer genetics annually so that we can continuously update the family history and inform her of any changes in cancer genetics and testing that may be of benefit for this family.   Ms.  Haney questions were answered to her satisfaction today. Our contact information was provided should additional questions or concerns arise. Thank you for the referral and allowing [place] to share in the care of your patient.   Deania Siguenza P. Florene Glen, [place], [facility] Certified Genetic Counselor Santiago [email or web address] phone: [phone]  The patient was seen for a total of 60 minutes in face-to-face genetic counseling.  This patient was discussed with Drs. Magrinat, Lindi Adie and/or Burr Medico who agrees with the above.    _______________________________________________________________________ For Office Staff:  Number of people involved in session: 1 Was an Intern/ student involved with case: no

## 2016-12-17 ENCOUNTER — Other Ambulatory Visit: Payer: Self-pay | Admitting: Physician Assistant

## 2017-01-06 ENCOUNTER — Telehealth: Payer: Self-pay | Admitting: Genetic Counselor

## 2017-01-06 NOTE — Telephone Encounter (Signed)
Returned patient's call about her test results.  The results, per the lab, should be available at the beginning to middle of next week.  I will call the patient once they are completed and depending on whether the results are positive or negative will determine if she needs to come in again.

## 2017-01-07 ENCOUNTER — Telehealth: Payer: Self-pay | Admitting: Genetic Counselor

## 2017-01-07 ENCOUNTER — Encounter: Payer: Self-pay | Admitting: Genetic Counselor

## 2017-01-07 ENCOUNTER — Ambulatory Visit: Payer: Self-pay | Admitting: Genetic Counselor

## 2017-01-07 DIAGNOSIS — Z8042 Family history of malignant neoplasm of prostate: Secondary | ICD-10-CM

## 2017-01-07 DIAGNOSIS — Z1379 Encounter for other screening for genetic and chromosomal anomalies: Secondary | ICD-10-CM

## 2017-01-07 DIAGNOSIS — Z809 Family history of malignant neoplasm, unspecified: Secondary | ICD-10-CM

## 2017-01-07 DIAGNOSIS — C541 Malignant neoplasm of endometrium: Secondary | ICD-10-CM

## 2017-01-07 NOTE — Telephone Encounter (Signed)
Revealed negative genetic testing.  Discussed that we do not know why she has uterine cancer or why there is cancer in the family. It could be due to a different gene that we are not testing, or maybe our current technology may not be able to pick something up.  It will be important for her to keep in contact with genetics to keep up with whether additional testing may be needed.  Revealed that a RAD50 p.V842A VUS found on the CancerNext panel testing.  No pathogenic mutations or variants of unknown significance of somatic origin were detected in the tumor of this individual.  The tumor results are most consistent with somatic/acquired inactivation of the MLH1 gene.  Taken with the germline results demonstrating absence of pathogenic mutations or likely pathogenic variants in the mismatch repair genes, the likelihood that this individual has Lynch syndrome is greatly decreased.  However the possibility of an undetected variant of either germline or somatic origine due to mosaicism and other rare etiologies cannot be ruled out by the current methodology.

## 2017-01-07 NOTE — Progress Notes (Signed)
HPI: Susan Haney was previously seen in the [place] clinic due to a personal and family history of cancer and concerns regarding a hereditary predisposition to cancer. Please refer to our prior cancer genetics clinic note for more information regarding Susan Haney's medical, social and family histories, and our assessment and recommendations, at the time. [name] recent genetic test results were disclosed to her, as were recommendations warranted by these results. These results and recommendations are discussed in more detail below.  CANCER HISTORY:    Endometrial cancer ([place])   04/13/2016 Initial Diagnosis    Endometrial cancer ([place])     01/06/2017 Genetic Testing    RAD50 p.V842A VUS found on the CancerNext panel testing.  The CancerNext gene panel offered by [employer] includes sequencing and rearrangement analysis for the following 32 genes:   APC, ATM, BARD1, BMPR1A, BRCA1, BRCA2, BRIP1, CDH1, CDK4, CDKN2A, CHEK2, EPCAM, GREM1, MLH1, MRE11A, MSH2, MSH6, MUTYH, NBN, NF1, PALB2, PMS2, POLD1, POLE, PTEN, RAD50, RAD51D, SMAD4, SMARCA4, STK11, and TP53.    No pathogenic mutations or variants of unknown significance of somatic origin were detected in the tumor of this individual.  The tumor results are most consistent with somatic/acquired inactivation of the MLH1 gene.  Taken with the germline results demonstrating absence of pathogenic mutations or likely pathogenic variants in the mismatch repair genes, the likelihood that this individual has Lynch syndrome is greatly decreased.  However the possibility of an undetected variant of either germline or somatic origine due to mosaicism and other rare etiologies cannot be ruled out by the current methodology.        FAMILY HISTORY:  We obtained a detailed, 4-generation family history.  Significant diagnoses are listed below: Family History  Problem Relation Age of Onset  . Prostate cancer Father 12  . Diabetes Maternal  Grandmother   . Heart attack Maternal Grandfather   . Cancer Sister        unknown primary  . Cancer Other 42       possibly pancreatic  . Cancer Other        possibly uterine or ovarian    The patient has 2 children who are cancer free.  She has three sisters and two brothers.  One sister has an unknown form of cancer and this sister's daughter had possible pancreatic cancer at 35.  One brother has a daughter who was recently diagnosed with a gynecological cancer.  The patient's parents are deceased.  Her father had prostate cancer at 58 and her mother died from complications of diabetes.  There was no further cancer's reported in the family.  Susan Haney is unaware of previous family history of genetic testing for hereditary cancer risks. Patient's maternal ancestors are of Caucasian descent, and paternal ancestors are of Caucasian descent. There is no reported Ashkenazi Jewish ancestry. There is no known consanguinity.  GENETIC TEST RESULTS: Genetic testing reported out on January 06, 2017 through the [place] with CancerNext cancer panel found no deleterious mutations.  The CancerNext gene panel offered by [employer] includes sequencing and rearrangement analysis for the following 32 genes:   APC, ATM, BARD1, BMPR1A, BRCA1, BRCA2, BRIP1, CDH1, CDK4, CDKN2A, CHEK2, EPCAM, GREM1, MLH1, MRE11A, MSH2, MSH6, MUTYH, NBN, NF1, PALB2, PMS2, POLD1, POLE, PTEN, RAD50, RAD51D, SMAD4, SMARCA4, STK11, and TP53.    No pathogenic mutations or variants of unknown significance of somatic origin were detected in the tumor of this individual.  The tumor results are most consistent with somatic/acquired  inactivation of the MLH1 gene.  Taken with the germline results demonstrating absence of pathogenic mutations or likely pathogenic variants in the mismatch repair genes, the likelihood that this individual has Lynch syndrome is greatly decreased.  However the possibility of an undetected variant of either  germline or somatic origine due to mosaicism and other rare etiologies cannot be ruled out by the current methodology.  The test report has been scanned into EPIC and is located under the Molecular Pathology section of the Results Review tab.   We discussed with [name] that since the current genetic testing is not perfect, it is possible there may be a gene mutation in one of these genes that current testing cannot detect, but that chance is small. We also discussed, that it is possible that another gene that has not yet been discovered, or that we have not yet tested, is responsible for the cancer diagnoses in the family, and it is, therefore, important to remain in touch with cancer genetics in the future so that we can continue to offer Susan Haney the most up to date genetic testing.   Genetic testing did detect a Variant of Unknown Significance in the RAD50 gene called c.2525T>C (U.K0254Y). At this time, it is unknown if this variant is associated with increased cancer risk or if this is a normal finding, but most variants such as this get reclassified to being inconsequential. It should not be used to make medical management decisions. With time, we suspect the lab will determine the significance of this variant, if any. If we do learn more about it, we will try to contact Susan Haney to discuss it further. However, it is important to stay in touch with [place] periodically and keep the address and phone number up to date.     CANCER SCREENING RECOMMENDATIONS: This result is reassuring and indicates that Susan Haney likely does not have an increased risk for a future cancer due to a mutation in one of these genes. This normal test also suggests that Susan Haney's cancer was most likely not due to an inherited predisposition associated with one of these genes.  Most cancers happen by chance and this negative test suggests that her cancer falls into this category.  We, therefore, recommended she continue to  follow the cancer management and screening guidelines provided by her oncology and primary healthcare provider.   RECOMMENDATIONS FOR FAMILY MEMBERS: Women in this family might be at some increased risk of developing cancer, over the general population risk, simply due to the family history of cancer. We recommended women in this family have a yearly mammogram beginning at age 20, or 57 years younger than the earliest onset of cancer, an annual clinical breast exam, and perform monthly breast self-exams. Women in this family should also have a gynecological exam as recommended by their primary provider. All family members should have a colonoscopy by age 7.  Based on Ms. Elena's family history, we recommended her niece, who was diagnosed with uterine cancer at age 39, have genetic counseling and testing. Ms. Hobdy will let us know if we can be of any assistance in coordinating genetic counseling and/or testing for this family member.   FOLLOW-UP: Lastly, we discussed with Ms. Labreck that cancer genetics is a rapidly advancing field and it is possible that new genetic tests will be appropriate for her and/or her family members in the future. We encouraged her to remain in contact with cancer genetics on an annual basis so we  can update her personal and family histories and let her know of advances in cancer genetics that may benefit this family.   Our contact number was provided. Ms. Moening questions were answered to her satisfaction, and she knows she is welcome to call us at anytime with additional questions or concerns.   Roma Kayser, MS, [facility] Certified Genetic Counselor [email or web address]

## 2017-01-27 ENCOUNTER — Ambulatory Visit
Admission: RE | Admit: 2017-01-27 | Discharge: 2017-01-27 | Disposition: A | Discharge status: Home / Self Care | Payer: Medicare Other | Source: Ambulatory Visit | Attending: Radiation Oncology | Admitting: Radiation Oncology

## 2017-01-27 ENCOUNTER — Encounter: Payer: Self-pay | Admitting: Radiation Oncology

## 2017-01-27 VITALS — BP 153/80 | HR 69 | Temp 97.9°F | Ht 63.0 in | Wt 203.2 lb

## 2017-01-27 DIAGNOSIS — C541 Malignant neoplasm of endometrium: Secondary | ICD-10-CM

## 2017-01-27 DIAGNOSIS — R300 Dysuria: Secondary | ICD-10-CM | POA: Insufficient documentation

## 2017-01-27 NOTE — Progress Notes (Signed)
Patient was placed on the heart monitor.  Her rhythm was normal sinus at 82 - 84 with frequent PVC's.

## 2017-01-27 NOTE — Progress Notes (Addendum)
Susan Haney is here for follow up.  She reports having pain in her right knee that is worse with sitting.  She has been having this pain since she had her knee replacement.  She also mentioned that her knee is giving out on her when walking.  She reports having urinary frequency and urgency.  She reports having occasional constipation and takes Miralax as needed.  She denies having any vaginal bleeding or discharge.  She denies having fatigue and reports having a good appetite.  Her heart rate is irregular today.  She reports that she feels like it skips beats sometimes.  She also reports having numbness in her hands and swelling in her lower legs, left greater than the right. She is using a vaginal dilator 3 times a week.  BP (!) 153/80 (BP Location: Right Arm, Patient Position: Sitting)   Pulse 69   Temp 97.9 F (36.6 C) (Oral)   Ht 5\' 3"  (1.6 m)   Wt 203 lb 3.2 oz (92.2 kg)   SpO2 98%   BMI 36.00 kg/m    Wt Readings from Last 3 Encounters:  01/27/17 203 lb 3.2 oz (92.2 kg)  10/20/16 200 lb 4.8 oz (90.9 kg)  08/19/16 200 lb 3.2 oz (90.8 kg)

## 2017-01-27 NOTE — Progress Notes (Signed)
Radiation Oncology         [phone] ________________________________  Name: Susan Haney MRN: [id number]  Date: 01/27/2017  DOB: [birth year]  Follow-Up Visit Note  CC: Octavio Graves, DO  Everitt Amber, MD    ICD-10-CM   1. Endometrial cancer ([place]) C54.1     Diagnosis:    Stage IB grade 2 endometrioid endometrial adenocarcinoma with high/intermediate risk factors and microsatellite instability on routine tumor testing  Interval Since Last Radiation:  7 months 06/15/16-07/06/16 30 Gy in 5 fractions to the vaginal cuff  Narrative:  The patient returns today for routine follow-up. The patient reports having pain in her right knee that is worse with sitting.  She has been having this pain since she had her knee replacement.  She also mentioned that her knee is giving out on her when walking.  She reports having urinary frequency and urgency.  She reports having occasional constipation and takes Miralax as needed.  She denies having any vaginal bleeding or discharge.  She denies having fatigue and reports having a good appetite.  Her heart rate is irregular today.  She reports that she feels like it skips beats sometimes.  She also reports having numbness in her hands and swelling in her lower legs, left greater than the right. She reports having a ultrasound of the left lower extremity to rule out DVT She is using a vaginal dilator 3 times a week. She denies chest pains, SOB, and neck pain. The patient endorses compression hosiery that provides relief from swelling.   She continues to use her vaginal dilator. Patient was placed on the heart monitor which showed frequent PVCs but no evidence of atrial fibrillation.   She denies any chest pain or shortness of breath. I recommend she follow-up with her primary care physician concerning her pulse.                  ALLERGIES:  has No Known Allergies.  Meds: Current Outpatient Prescriptions  Medication Sig Dispense Refill  . aspirin EC 81 MG  tablet Take 81 mg by mouth daily.    . cholecalciferol (VITAMIN D) 400 UNITS TABS tablet Take 2,000 Units by mouth.    . docusate sodium (COLACE) 100 MG capsule Take 100 mg by mouth 2 (two) times daily.    Marland Kitchen lisinopril-hydrochlorothiazide (PRINZIDE,ZESTORETIC) 10-12.5 MG tablet Take 1 tablet by mouth daily.    . metFORMIN (GLUMETZA) 500 MG (MOD) 24 hr tablet Take 500 mg by mouth every evening.    . Multiple Vitamins-Minerals (CENTRUM SILVER ADULT 50+) TABS Take 1 tablet by mouth daily.     . naproxen (NAPROSYN) 500 MG tablet Take 500 mg by mouth 2 (two) times daily with a meal.    . Omega-3 Fatty Acids (OMEGA 3 500 PO) Take 4 tablets by mouth.     Marland Kitchen omeprazole (PRILOSEC) 20 MG capsule TAKE 1 CAPSULE (20 MG TOTAL) BY MOUTH DAILY. 90 capsule 3  . polyethylene glycol (MIRALAX / GLYCOLAX) packet Take 17 g by mouth daily as needed for moderate constipation.  0  . Probiotic Product (PHILLIPS COLON HEALTH PO) Take 1 tablet by mouth daily.     . sitaGLIPtin (JANUVIA) 100 MG tablet Take 100 mg by mouth daily.    Marland Kitchen triamcinolone cream (KENALOG) 0.1 % APPLY TO AFFECTED AREA ONE TO TWO TIMES DAILY (NOT FOR FACE/SKINFOLDS)  6  . hydrOXYzine (ATARAX/VISTARIL) 25 MG tablet TAKE 1 TABLET EVERY 8 HOURS AS NEEDED FOR ITCHING  1  No current facility-administered medications for this encounter.     Physical Findings: The patient is in no acute distress. Patient is alert and oriented.  height is '5\' 3"'  (1.6 m) and weight is 203 lb 3.2 oz (92.2 kg). Her oral temperature is 97.9 F (36.6 C). Her oxygen saturation is 98%. .  Lungs are clear to auscultation bilaterally. Heart has regular rate and rhythm. No palpable cervical, supraclavicular, or axillary adenopathy. Abdomen soft, non-tender, normal bowel sounds. On pelvic examination the external genitalia is unremarkable. A speculum exam is performed. There are no mucosal lesions noted in the vaginal vault. On bimanual and rectovaginal examination there no pelvic  masses appreciated.  Lab Findings: Lab Results  Component Value Date   WBC 13.2 (H) 04/14/2016   HGB 9.4 (L) 04/14/2016   HCT 29.8 (L) 04/14/2016   MCV 88.2 04/14/2016   PLT 288 04/14/2016    Radiographic Findings: No results found.  Impression:  No evidence of recurrence on clinical exam.  Plan:  Routine follow-up in six months. Patient will see Dr. Denman George in October.  -----------------------------------  Blair Promise, PhD, MD  This document serves as a record of services personally performed by Gery Pray, MD. It was created on his behalf by Linward Natal, a trained medical scribe. The creation of this record is based on the scribe's personal observations and the provider's statements to them. This document has been checked and approved by the attending provider.

## 2017-04-18 ENCOUNTER — Ambulatory Visit: Payer: Medicare Other | Attending: Gynecologic Oncology | Admitting: Gynecologic Oncology

## 2017-04-18 ENCOUNTER — Encounter: Payer: Self-pay | Admitting: Gynecologic Oncology

## 2017-04-18 VITALS — BP 136/49 | HR 91 | Temp 98.2°F | Resp 18 | Ht 63.0 in | Wt 208.7 lb

## 2017-04-18 DIAGNOSIS — Z6836 Body mass index (BMI) 36.0-36.9, adult: Secondary | ICD-10-CM | POA: Insufficient documentation

## 2017-04-18 DIAGNOSIS — K219 Gastro-esophageal reflux disease without esophagitis: Secondary | ICD-10-CM | POA: Diagnosis not present

## 2017-04-18 DIAGNOSIS — M519 Unspecified thoracic, thoracolumbar and lumbosacral intervertebral disc disorder: Secondary | ICD-10-CM | POA: Insufficient documentation

## 2017-04-18 DIAGNOSIS — M549 Dorsalgia, unspecified: Secondary | ICD-10-CM | POA: Diagnosis not present

## 2017-04-18 DIAGNOSIS — I10 Essential (primary) hypertension: Secondary | ICD-10-CM | POA: Diagnosis not present

## 2017-04-18 DIAGNOSIS — Z8042 Family history of malignant neoplasm of prostate: Secondary | ICD-10-CM | POA: Diagnosis not present

## 2017-04-18 DIAGNOSIS — C541 Malignant neoplasm of endometrium: Secondary | ICD-10-CM

## 2017-04-18 DIAGNOSIS — E119 Type 2 diabetes mellitus without complications: Secondary | ICD-10-CM | POA: Insufficient documentation

## 2017-04-18 DIAGNOSIS — M810 Age-related osteoporosis without current pathological fracture: Secondary | ICD-10-CM | POA: Diagnosis not present

## 2017-04-18 DIAGNOSIS — Z923 Personal history of irradiation: Secondary | ICD-10-CM | POA: Diagnosis not present

## 2017-04-18 DIAGNOSIS — Z7984 Long term (current) use of oral hypoglycemic drugs: Secondary | ICD-10-CM | POA: Insufficient documentation

## 2017-04-18 DIAGNOSIS — M1990 Unspecified osteoarthritis, unspecified site: Secondary | ICD-10-CM | POA: Insufficient documentation

## 2017-04-18 DIAGNOSIS — Z90722 Acquired absence of ovaries, bilateral: Secondary | ICD-10-CM | POA: Diagnosis not present

## 2017-04-18 DIAGNOSIS — Z79899 Other long term (current) drug therapy: Secondary | ICD-10-CM | POA: Insufficient documentation

## 2017-04-18 DIAGNOSIS — G8929 Other chronic pain: Secondary | ICD-10-CM | POA: Insufficient documentation

## 2017-04-18 DIAGNOSIS — Z96653 Presence of artificial knee joint, bilateral: Secondary | ICD-10-CM | POA: Diagnosis not present

## 2017-04-18 DIAGNOSIS — E669 Obesity, unspecified: Secondary | ICD-10-CM | POA: Diagnosis not present

## 2017-04-18 DIAGNOSIS — Z90710 Acquired absence of both cervix and uterus: Secondary | ICD-10-CM | POA: Diagnosis not present

## 2017-04-18 DIAGNOSIS — Z8542 Personal history of malignant neoplasm of other parts of uterus: Secondary | ICD-10-CM | POA: Diagnosis not present

## 2017-04-18 DIAGNOSIS — Z08 Encounter for follow-up examination after completed treatment for malignant neoplasm: Secondary | ICD-10-CM | POA: Insufficient documentation

## 2017-04-18 DIAGNOSIS — Z7982 Long term (current) use of aspirin: Secondary | ICD-10-CM | POA: Diagnosis not present

## 2017-04-18 NOTE — Patient Instructions (Addendum)
Please contact Dr Serita Grit office in January, 2019 to schedule an appointment in April, 2019. [place].  Please notify Dr Denman George at phone number [phone] if you notice vaginal bleeding, new pelvic or abdominal pains, bloating, feeling full easy, or a change in bladder or bowel function.    Your appointment with Dr. Sondra Come will be on December 17. 2018 at 10:30am.

## 2017-04-18 NOTE — Progress Notes (Signed)
Follow-up Note: Endometrial cancer   Susan Haney 83 y.o. female  Chief Complaint  Patient presents with  . Endometrial cancer [facility])  history of endometrial cancer  Assessment : 82 year old woman with stage IB grade 2 endometrioid endometrial adenocarcinoma with high/intermediate risk factors and Microsatellite instability on routine tumor testing. s/p vaginal brachytherapy (completed December, 2017). No evidence for recurrence.  Plan:  I recommend she follow-up at 3 monthly intervals for symptom review, physical examination and pelvic examination. Pap smear is not recommended in routine endometrial cancer surveillance. After 2 years we will space these visits to every 6 months, and then annually if recurrence has not developed within 5 years. Next appointment is with Dr Sondra Come in December, I will plan on seeing her in March/April, 2019.  Re-refer to genetic testing.  All questions were answered.   HPI: The patient had onset of postmenopausal bleeding approximately a month ago. This was not  associated with any pain or other symptoms. An endometrial biopsy was obtained showing a grade 1-2 endometrial adenocarcinoma. Pelvic ultrasound showed the uterus to measure 7 x 4.4 x 5.6 cm with 2 small fibroids. The left ovary surgically absent right ovary appeared normal there is no free fluid.  Today patient is having some vaginal bleeding. She also notes some increased edema and pain in her left lower extremity below the knee.  The patient has a past history of a benign left ovarian cyst which was removed laparoscopically. The patient underwent menopause at age 30. Pap smears are normal  Obstetrical history gravida 2 para 2  Comorbidities include diabetes valvular heart disease osteoporosis lumbar disc disease and obesity.  Interval Hx: Her original surgery date had to be postponed when she acutely fell and fractured her left distal femur assoicated with a knee replacement on 02/12/16.  She required emergent repair and replacement and had been in rehab since that time.   On 04/13/16 we performed robotic assisted total hysterectomy, BSO, bilateral SLN biopsy. Final pathology revealed a 4.3cm FIGO grade 2 tumor with 10 of 71m myometrial invasion, negative LVSI and cervical stromal involvement. Bilateral SLN's were negative. 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 She cancelled her initial referall to genetics due to back pain.  She had some delayed cuff healing (without dehiscence).  She went on to receive vaginal brachytherapy in accordance with NCCN guidelines: 06/15/16-07/06/16 30 Gy in 5 fractions to the vaginal cuff  She has no vaginal bleeding or symptoms concerning for recurrence.  Review of Systems:10 point review of systems is negative except as noted in interval history.   Vitals: Blood pressure (!) 136/49, pulse 91, temperature 98.2 F (36.8 C), temperature source Oral, resp. rate 18, height 5' 3" (1.6 m), weight 208 lb 11.2 oz (94.7 kg), SpO2 96 %.  Physical Exam: General : The patient is a healthy woman in no acute distress.  HEENT: normocephalic, extraoccular movements normal; neck is supple without thyromegally  Lynphnodes: Supraclavicular and inguinal nodes not enlarged  Abdomen: Soft, non-tender, no ascites, no organomegally, no masses, no hernias. Incisions well healed. Pelvic:  EGBUS: Normal female (atrophic) Vagina: Normal, atrophic, no lesions. Cuff healing normally and in tact with no blood. Urethra and Bladder: Normal, non-tender  Cervix & uterus surgically absent. Bi-manual examination: cuff in tact. Rectal: normal sphincter tone, no masses, no blood  Lower extremities: incision healing normally on left leg/knee with no signs of infection. Can flex to 110 degrees.      No Known  Allergies  Past Medical History:  Diagnosis Date  . Anemia    history ,  took iron for years  . Arthritis   . Bicuspid aortic valve    . Cancer [facility])    endometrial cancer  . Chronic back pain   . Diabetes ([place])    x 15 yrs with good control  . Endometrial cancer ([place])   . Family history of cancer   . Family history of prostate cancer   . GERD (gastroesophageal reflux disease)   . History of radiation therapy 06/15/16-07/06/16   vaginal cuff 30 Gy in 5 fractions  . Hypertension     Past Surgical History:  Procedure Laterality Date  . BUNIONECTOMY    . COLONOSCOPY N/A 04/04/2014   Procedure: COLONOSCOPY;  Surgeon: Rogene Houston, MD;  Location: [facility];  Service: Endoscopy;  Laterality: N/A;  100  . EYE SURGERY     bil cataracts and lens implants  . FOOT SURGERY     Left  . Ovary removed: benign    . REPLACEMENT TOTAL KNEE BILATERAL     2013  . ROBOTIC ASSISTED TOTAL HYSTERECTOMY WITH BILATERAL SALPINGO OOPHERECTOMY Left 04/13/2016   Procedure: XI ROBOTIC ASSISTED TOTAL HYSTERECTOMY WITH LEFT SALPINGO OOPHORECTOMY SENTINEL LYMPH NODE BIOPSY;  Surgeon: Everitt Amber, MD;  Location: [facility] ORS;  Service: Gynecology;  Laterality: Left;  . TOTAL KNEE REVISION Left 02/12/2016   Procedure: LEFT TOTAL KNEE REVISION;  Surgeon: Rod Can, MD;  Location: [facility] ORS;  Service: Orthopedics;  Laterality: Left;    Current Outpatient Prescriptions  Medication Sig Dispense Refill  . aspirin EC 81 MG tablet Take 81 mg by mouth daily.    . cholecalciferol (VITAMIN D) 400 UNITS TABS tablet Take 2,000 Units by mouth.    . docusate sodium (COLACE) 100 MG capsule Take 100 mg by mouth 2 (two) times daily.    . hydrOXYzine (ATARAX/VISTARIL) 25 MG tablet TAKE 1 TABLET EVERY 8 HOURS AS NEEDED FOR ITCHING  1  . lisinopril-hydrochlorothiazide (PRINZIDE,ZESTORETIC) 10-12.5 MG tablet Take 1 tablet by mouth daily.    . metFORMIN (GLUMETZA) 500 MG (MOD) 24 hr tablet Take 500 mg by mouth every evening.    . Multiple Vitamins-Minerals (CENTRUM SILVER ADULT 50+) TABS Take 1 tablet by mouth daily.     . naproxen (NAPROSYN) 500 MG tablet Take  500 mg by mouth 2 (two) times daily with a meal.    . Omega-3 Fatty Acids (OMEGA 3 500 PO) Take 4 tablets by mouth.     Marland Kitchen omeprazole (PRILOSEC) 20 MG capsule TAKE 1 CAPSULE (20 MG TOTAL) BY MOUTH DAILY. 90 capsule 3  . polyethylene glycol (MIRALAX / GLYCOLAX) packet Take 17 g by mouth daily as needed for moderate constipation.  0  . Probiotic Product (PHILLIPS COLON HEALTH PO) Take 1 tablet by mouth daily.     . sitaGLIPtin (JANUVIA) 100 MG tablet Take 100 mg by mouth daily.    Marland Kitchen triamcinolone cream (KENALOG) 0.1 % APPLY TO AFFECTED AREA ONE TO TWO TIMES DAILY (NOT FOR FACE/SKINFOLDS)  6   No current facility-administered medications for this visit.     Social History   Social History  . Marital status: Widowed    Spouse name: N/A  . Number of children: 2  . Years of education: N/A   Occupational History  . Not on file.   Social History Main Topics  . Smoking status: Never Smoker  . Smokeless tobacco: Never Used  . Alcohol use  No  . Drug use: No  . Sexual activity: Not Currently   Other Topics Concern  . Not on file   Social History Narrative  . No narrative on file    Family History  Problem Relation Age of Onset  . Prostate cancer Father 58  . Diabetes Maternal Grandmother   . Heart attack Maternal Grandfather   . Cancer Sister        unknown primary  . Cancer Other 42       possibly pancreatic  . Cancer Other        possibly uterine or ovarian    Donaciano Eva, MD 04/18/2017, 2:15 PM  CC: Dr Octavio Graves

## 2017-05-13 IMAGING — DX DG KNEE 1-2V PORT*L*
2 series · 2 of 2 positions shown · non-contrast
Comparison: Radiograph dated 02/11/2016

CLINICAL DATA: 84-year-old female status post revision of left
total knee arthroplasty.

EXAM:
PORTABLE LEFT KNEE - 1-2 VIEW; PORTABLE LEFT TIBIA AND FIBULA - 2
VIEW

[knee ap]
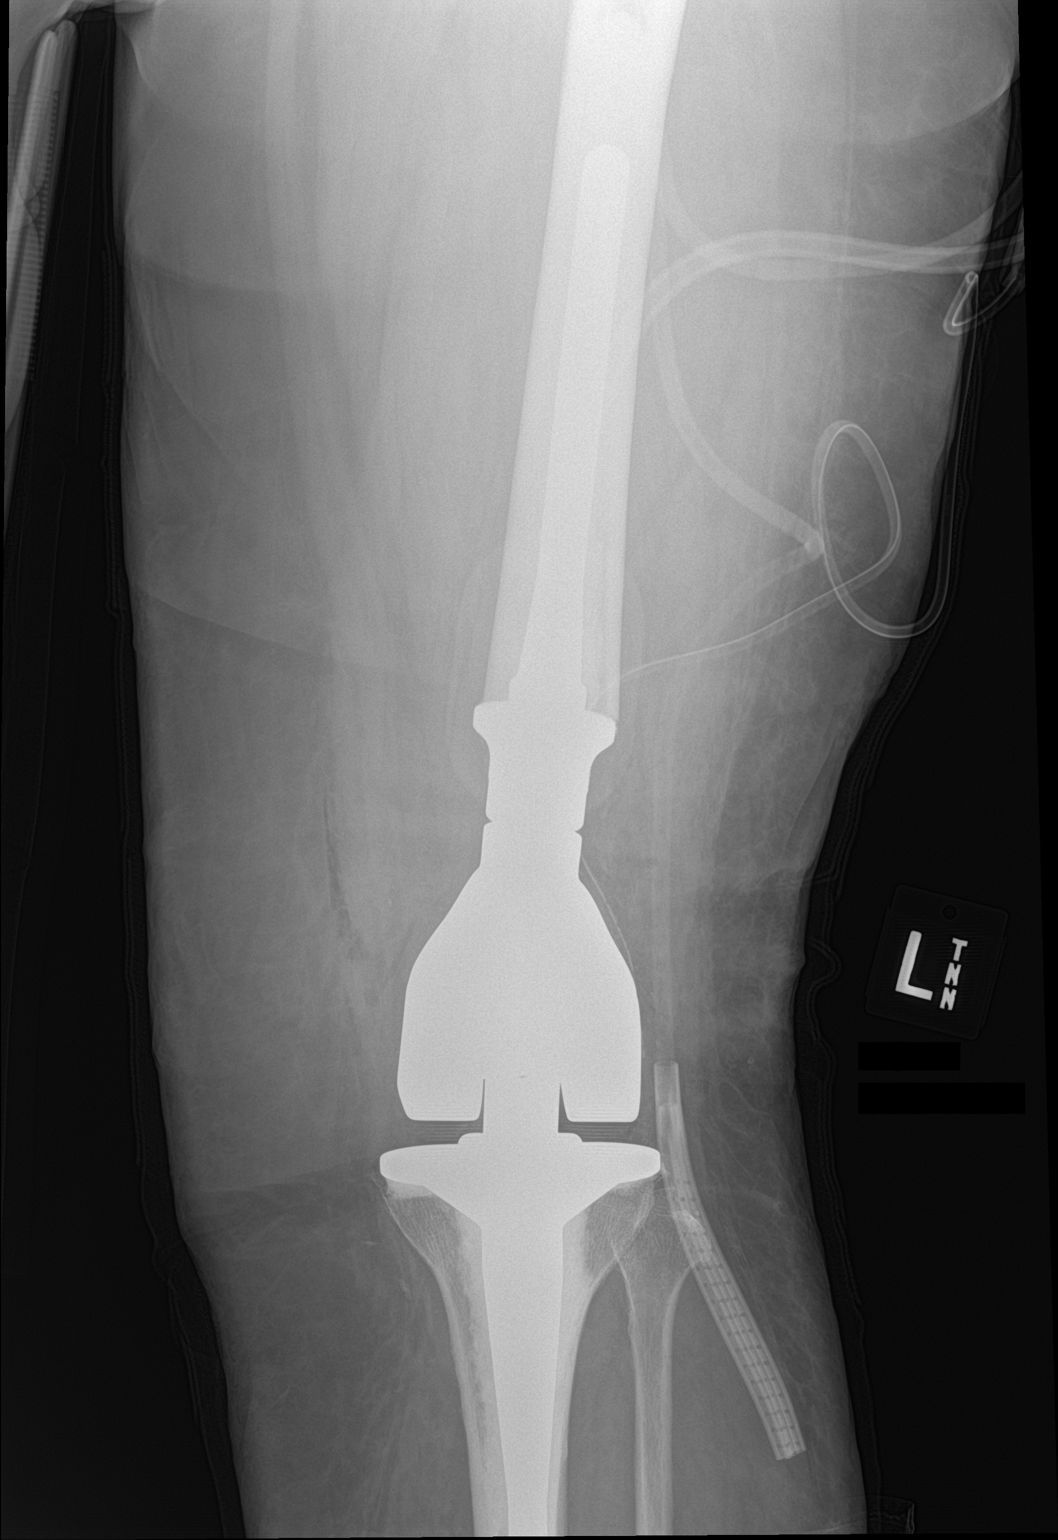

[knee lat]
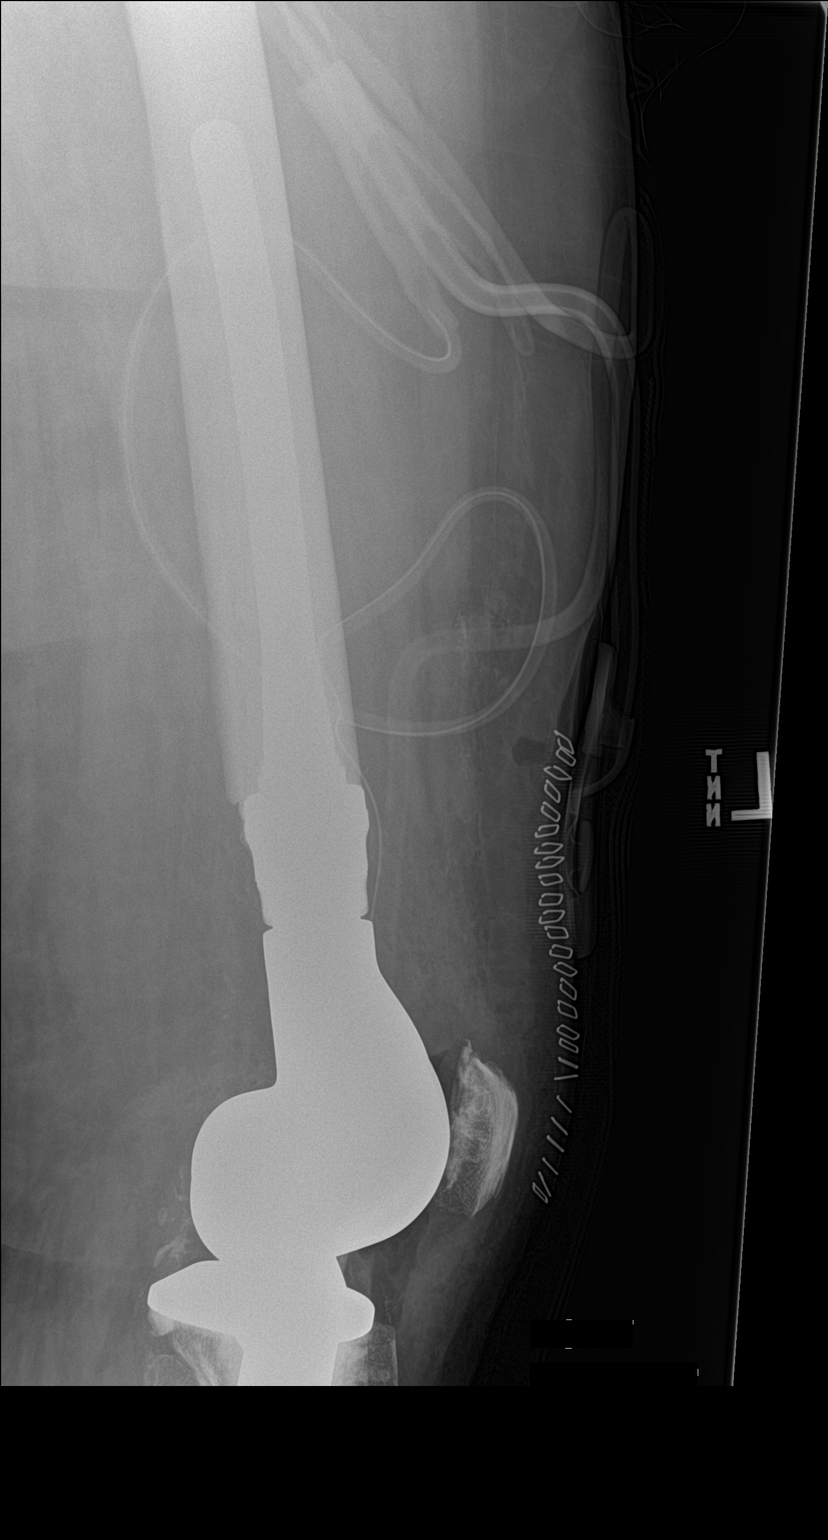

[2 of 2 positions shown; findings below may reference images not displayed]

FINDINGS: There has been interval surgical resection of the distal femur with
placement of a total knee arthroplasty with long segment of the
stents within the distal femur and proximal tibia. The arthroplasty
appears in good alignment. There is no acute fracture. GULLE SAMSUNG drainage
catheter is noted along the lateral aspect of the knee. Small amount
of air within the anterior compartment of the knee, likely
postsurgical. There is diffuse soft tissue edema. Multiple cutaneous
surgical clips noted anterior aspect of the knee.
IMPRESSION: Interval resection of the distal femur and placement of long stem
total knee arthroplasty. The arthroplasty appears in good anatomic
alignment.

## 2017-05-13 IMAGING — CT CT FEMUR *L* W/O CM
3 of 5 series · 10 of 35 positions shown, 12 images · non-contrast
Comparison: Left knee radiographs - 02/10/2026

CLINICAL DATA: Evaluate distal femur fracture seen on radiographs.

EXAM:
CT OF THE LEFT FEMUR WITHOUT CONTRAST
TECHNIQUE: Multidetector CT imaging was performed according to the standard
protocol. Multiplanar CT image reconstructions were also generated.

[Series 3: bone windows · axial · 0.46mm/px · z∈[-574,-409]mm · 2 of 165 slices shown]
[im 55/165  bone]
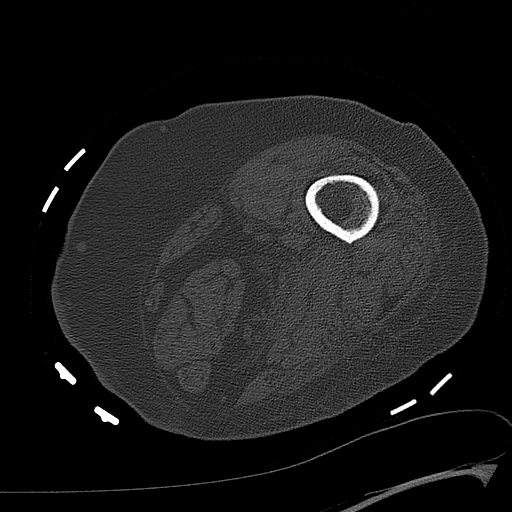
[im 110/165  bone]
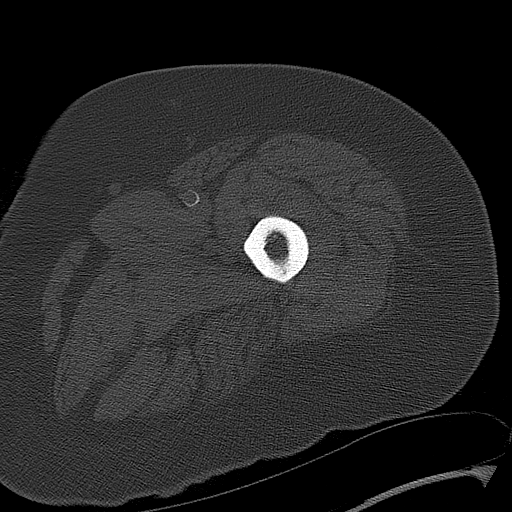

[Series 5: coronal bone · coronal · 0.38mm/px · 3 of 82 slices shown]
[im 17/82  bone]
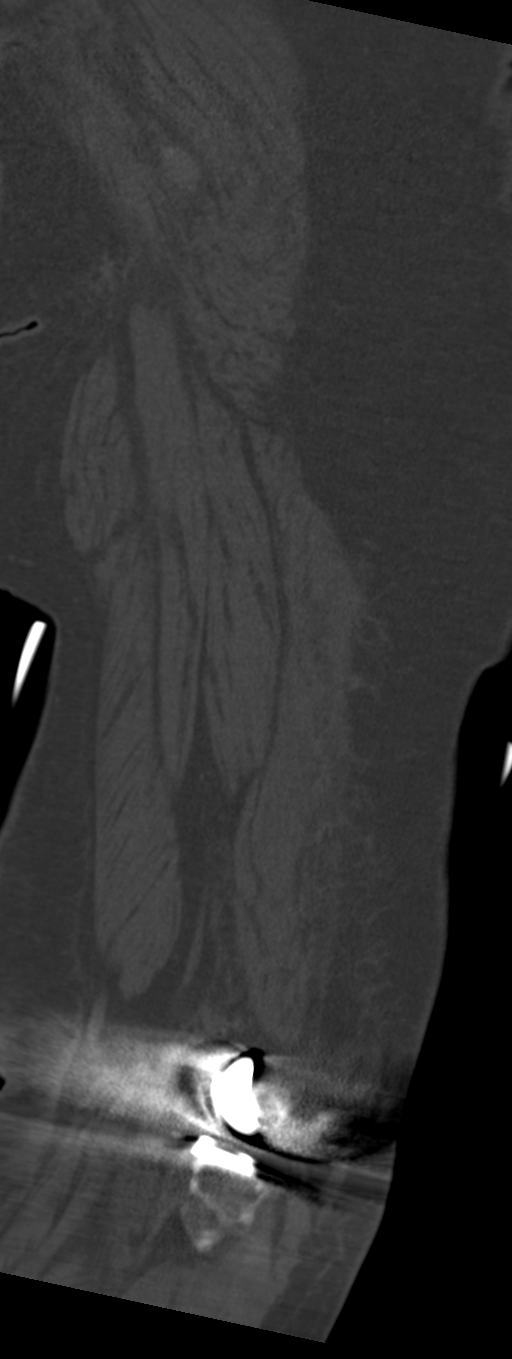
[im 33/82  bone]
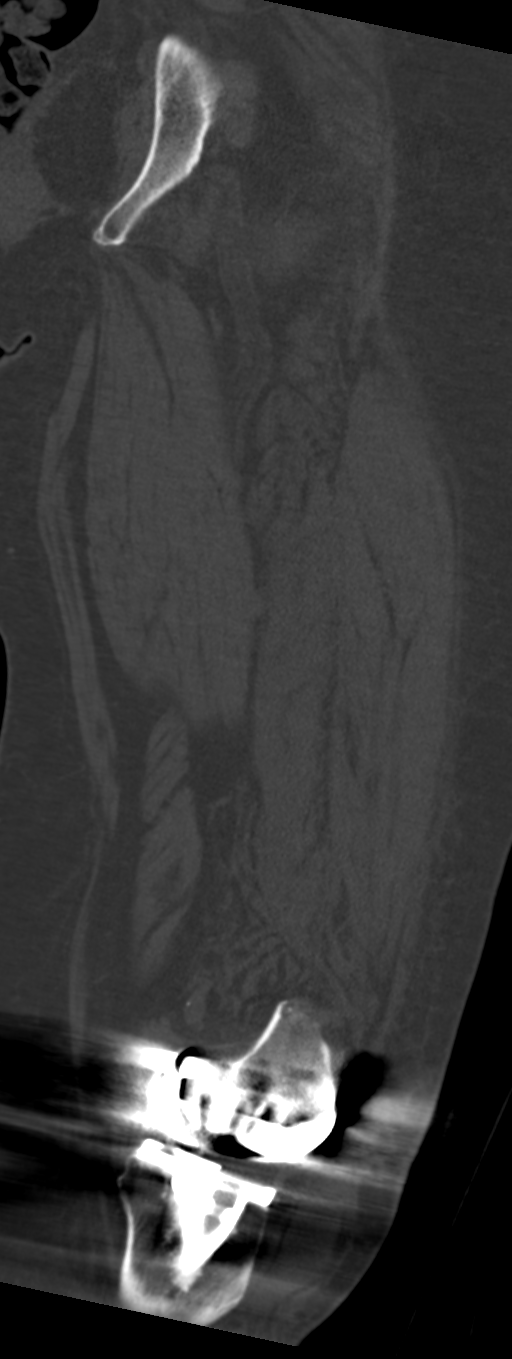
[im 49/82  bone]
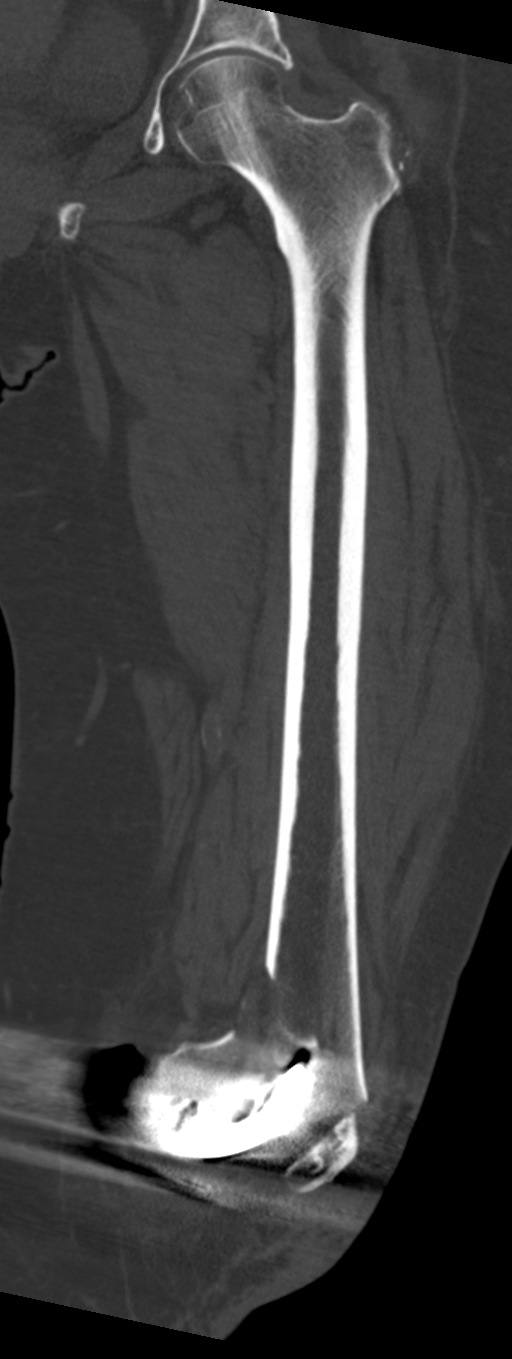

[Series 7: axial bone · axial · 0.38mm/px · z∈[-634,-317]mm · 5 of 245 slices shown, 7 images]
[im 41/245  soft-tissue]
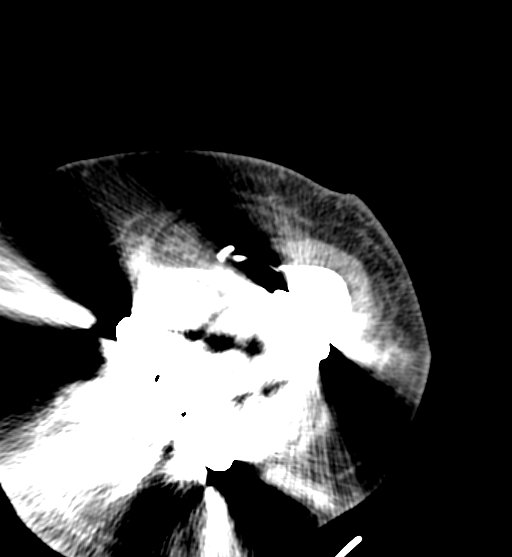
[im 41/245  bone]
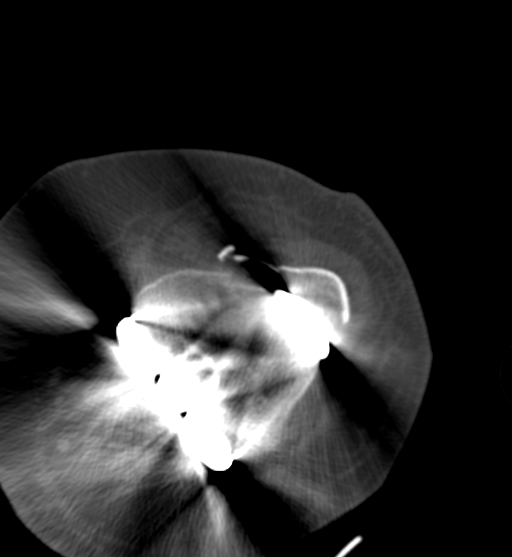
[im 82/245  bone]
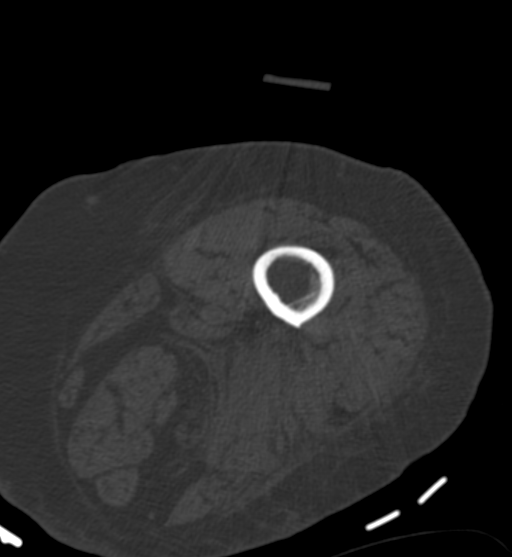
[im 123/245  bone]
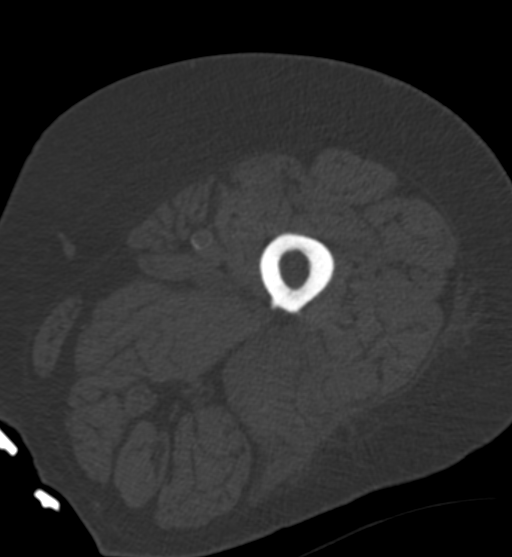
[im 163/245  bone]
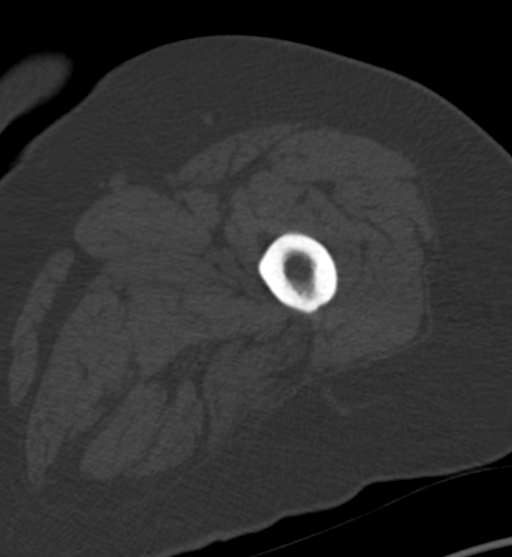
[im 204/245  soft-tissue]
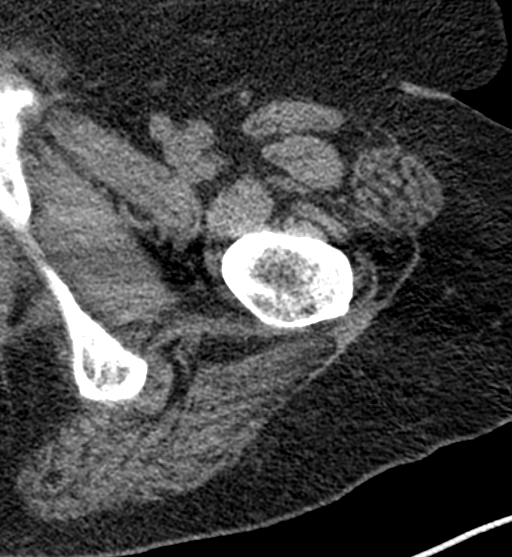
[im 204/245  bone]
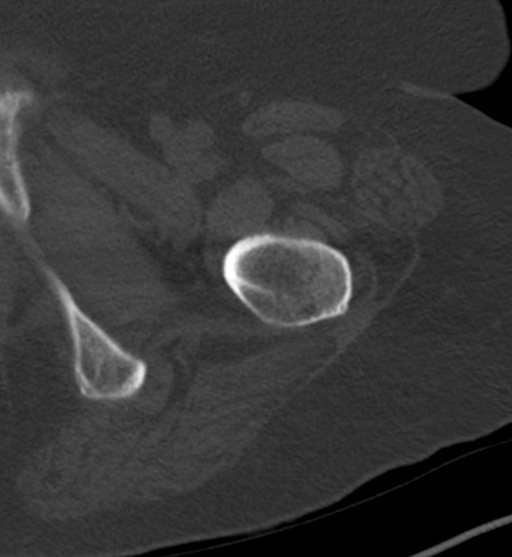

[10 of 35 positions shown; findings below may reference images not displayed]

FINDINGS: Re- demonstrated comminuted fracture involving the distal metaphysis
of the femur with foreshortening and angulation, apex anterior.

There are several osseous fragment about the main fracture site with
dominant fragment about the anterior medial aspect of main fracture
site measuring approximately 4.9 x 0.9 cm (image 45, series 6).
There is no definitive extension of the comminuted fracture to
involve the femoral component of the patient's left total knee
replacement. Additionally, the tibial component of the total knee
replacement appears intact.

Expected adjacent soft tissue swelling. Evaluation for knee joint
effusion and/or lipohemarthrosis is degraded secondary to streak
artifact from the patient's total knee prosthesis. No definite
radiopaque foreign body.

No additional fractures are identified. Normal appearance of the
left hip. Left hip joint spaces are preserved. No evidence avascular
necrosis. Several phleboliths are seen within the left hemipelvis.
Scattered minimal vascular calcifications within the left
superficial femoral artery.
IMPRESSION: 1. Re- demonstrated comminuted fracture of the distal metaphysis of
the left femur with foreshortening and angulation but without
definitive extension to involve the femoral component of the
patient's left total knee replacement.
2. No additional fractures identified.

## 2017-06-02 ENCOUNTER — Ambulatory Visit: Payer: Self-pay | Admitting: Radiation Oncology

## 2017-07-04 ENCOUNTER — Encounter: Payer: Self-pay | Admitting: Radiation Oncology

## 2017-07-04 ENCOUNTER — Other Ambulatory Visit: Payer: Self-pay

## 2017-07-04 ENCOUNTER — Ambulatory Visit
Admission: RE | Admit: 2017-07-04 | Discharge: 2017-07-04 | Disposition: A | Discharge status: Home / Self Care | Payer: Medicare Other | Source: Ambulatory Visit | Attending: Radiation Oncology | Admitting: Radiation Oncology

## 2017-07-04 VITALS — BP 155/74 | HR 70 | Temp 97.6°F | Ht 63.0 in | Wt 208.2 lb

## 2017-07-04 DIAGNOSIS — C541 Malignant neoplasm of endometrium: Secondary | ICD-10-CM

## 2017-07-04 DIAGNOSIS — R300 Dysuria: Secondary | ICD-10-CM | POA: Insufficient documentation

## 2017-07-04 NOTE — Progress Notes (Signed)
Radiation Oncology         [phone] ________________________________  Name: Susan Haney MRN: [id number]  Date: 07/04/2017  DOB: 1931/08/12  Follow-Up Visit Note  CC: Octavio Graves, DO  Everitt Amber, MD    ICD-10-CM   1. Endometrial cancer ([place]) C54.1     Diagnosis:   Stage IB grade 2 endometrioid  adenocarcinoma with high/intermediate risk factors and microsatellite instability on routine tumor testing   Interval Since Last Radiation:  1  Year   Indication for treatment:  Post-op, reduce the chance of vaginal cuff recurrence  Radiation treatment dates:   06/15/16, 06/22/16, 06/29/16, 07/01/16, 07/06/16  Site/dose:  Vaginal Cuff: 30 Gy in 5 fractions  Beams/energy:   HDR-Vaginal Brachytherapy with Iridium 192 as the high dose rate source. 3 cm diameter cylinder. Treatment length of 4 cm.    Narrative:  The patient returns today for routine follow-up.  She saw Dr. Denman George approximately 3 months ago and received a good report.  Patient is bothered by a  rash that her dermatologist is described as eczema and biopsied.  Patient does have a steroid skin cream for this issue.  She denies any pelvic pain vaginal bleeding or abdominal bloating. Patient continues to use her vaginal dilator 3 times a week                            ALLERGIES:  has No Known Allergies.  Meds: Current Outpatient Medications  Medication Sig Dispense Refill  . aspirin EC 81 MG tablet Take 81 mg by mouth daily.    . cholecalciferol (VITAMIN D) 400 UNITS TABS tablet Take 2,000 Units by mouth.    . docusate sodium (COLACE) 100 MG capsule Take 100 mg by mouth 2 (two) times daily.    Marland Kitchen lisinopril-hydrochlorothiazide (PRINZIDE,ZESTORETIC) 10-12.5 MG tablet Take 1 tablet by mouth daily.    . metFORMIN (GLUMETZA) 500 MG (MOD) 24 hr tablet Take 500 mg by mouth every evening.    . Multiple Vitamins-Minerals (CENTRUM SILVER ADULT 50+) TABS Take 1 tablet by mouth daily.     . naproxen (NAPROSYN) 500 MG  tablet Take 500 mg by mouth 2 (two) times daily with a meal.    . Omega-3 Fatty Acids (OMEGA 3 500 PO) Take 4 tablets by mouth.     Marland Kitchen omeprazole (PRILOSEC) 20 MG capsule TAKE 1 CAPSULE (20 MG TOTAL) BY MOUTH DAILY. 90 capsule 3  . Probiotic Product (PHILLIPS COLON HEALTH PO) Take 1 tablet by mouth daily.     . sitaGLIPtin (JANUVIA) 100 MG tablet Take 100 mg by mouth daily.    Marland Kitchen triamcinolone cream (KENALOG) 0.1 % APPLY TO AFFECTED AREA ONE TO TWO TIMES DAILY (NOT FOR FACE/SKINFOLDS)  6  . hydrOXYzine (ATARAX/VISTARIL) 25 MG tablet TAKE 1 TABLET EVERY 8 HOURS AS NEEDED FOR ITCHING  1  . polyethylene glycol (MIRALAX / GLYCOLAX) packet Take 17 g by mouth daily as needed for moderate constipation. (Patient not taking: Reported on 07/04/2017)  0   No current facility-administered medications for this encounter.     Physical Findings: The patient is in no acute distress. Patient is alert and oriented.  height is '5\' 3"'  (1.6 m) and weight is 208 lb 3.2 oz (94.4 kg). Her oral temperature is 97.6 F (36.4 C). Her blood pressure is 155/74 (abnormal) and her pulse is 70. Her oxygen saturation is 100%. .  Lungs are clear to auscultation bilaterally. Heart has regular  rate and rhythm. No palpable cervical, supraclavicular, or axillary adenopathy. Abdomen soft, non-tender, normal bowel sounds.On pelvic examination the external genitalia were unremarkable. A speculum exam was performed. There are no mucosal lesions noted in the vaginal vault. Mild radiation changes at the vaginal cuff.  On bimanual and rectovaginal examination there were no pelvic masses appreciated.  Lab Findings: Lab Results  Component Value Date   WBC 13.2 (H) 04/14/2016   HGB 9.4 (L) 04/14/2016   HCT 29.8 (L) 04/14/2016   MCV 88.2 04/14/2016   PLT 288 04/14/2016    Radiographic Findings: No results found.  Impression:  No evidence of recurrence on clinical exam today.  Plan:  Patient will see Dr. Denman George in March 2019 and follow-up  in radiation oncology in June 2019  ____________________________________ Gery Pray, MD

## 2017-07-04 NOTE — Progress Notes (Signed)
Susan Haney is here for follow up.  She denies having any pain.  She reports having a cold with a productive cough for about 3 weeks.  She has taking cepahlexin and said it is a little better.  She denies having any urinary issues but is having burning in her vaginal area.  She reports having occasional constipation.  She denies having any vaginal bleeding.  She reports having some redness/itching under her abdomen.  She would like a refill on antifungal powder.  She has multiple plaques across her abdomen.  She is applying kenalog cream to these areas.  She is using a vaginal dilator.  She also reports having numbness in her hands radiating up her arms.  She is seeing Dr. Melina Copa for this.  BP (!) 155/74 (BP Location: Left Arm, Patient Position: Sitting)   Pulse 70   Temp 97.6 F (36.4 C) (Oral)   Ht 5\' 3"  (1.6 m)   Wt 208 lb 3.2 oz (94.4 kg)   SpO2 100%   BMI 36.88 kg/m

## 2017-07-05 ENCOUNTER — Telehealth: Payer: Self-pay | Admitting: *Deleted

## 2017-07-05 NOTE — Telephone Encounter (Signed)
CALLED PATIENT TO INFORM OF FU APPT. WITH DR. Denman Haney ON 10-07-17 @ 1:15 PM, SPOKE WITH PATIENT AND SHE IS AWARE OF THIS APPT.

## 2017-08-22 ENCOUNTER — Encounter (INDEPENDENT_AMBULATORY_CARE_PROVIDER_SITE_OTHER): Payer: Self-pay | Admitting: Internal Medicine

## 2017-08-22 ENCOUNTER — Ambulatory Visit (HOSPITAL_COMMUNITY)
Admission: RE | Admit: 2017-08-22 | Discharge: 2017-08-22 | Disposition: A | Discharge status: Home / Self Care | Payer: Medicare Other | Source: Ambulatory Visit | Attending: Internal Medicine | Admitting: Internal Medicine

## 2017-08-22 ENCOUNTER — Ambulatory Visit (INDEPENDENT_AMBULATORY_CARE_PROVIDER_SITE_OTHER): Payer: Medicare Other | Admitting: Internal Medicine

## 2017-08-22 VITALS — BP 140/58 | HR 64 | Temp 98.2°F | Ht 63.0 in | Wt 200.4 lb

## 2017-08-22 DIAGNOSIS — K5900 Constipation, unspecified: Secondary | ICD-10-CM

## 2017-08-22 NOTE — Progress Notes (Signed)
Subjective:    Patient ID: Susan Haney, female    DOB: [birth year], 84 y.o.   MRN: [id number] PCP Dr. Melina Copa HPI Presents today with c/o change in stool/constipation. She wqas last seen in December of 2016 for f/u of chronic constipation. Had been taking Ultimate Care which had helped her constipation.    She tells me she cannot have a BM today. She describes as little balls. She started Senna which did not help.  She has tried Mag Citrate which did not help. Has tried an enema which does not help.  She says her stools are much smaller.  She denies any abdominal pain.  She denies any melena or BRRB.  She says she has been eating softer foods Followed by Oncology every 3 months.  Her last colonoscopy in September of 2015 by Dr. Laural Golden (average risk): Impression:  Examination performed to cecum. Single left-sided diverticulum and external hemorrhoids otherwise normal colonoscopy. Has recent hx of in September of 2017 (endometrial cancer):  Robotic assisted laparoscopic total hysterectomy with rt salphingoophorectomy by Dr. Denman George. She did receive radiation treatment.     Review of Systems Past Medical History:  Diagnosis Date  . Anemia    history ,  took iron for years  . Arthritis   . Bicuspid aortic valve   . Cancer [facility])    endometrial cancer  . Chronic back pain   . Diabetes ([place])    x 15 yrs with good control  . Endometrial cancer ([place])   . Family history of cancer   . Family history of prostate cancer   . GERD (gastroesophageal reflux disease)   . History of radiation therapy 06/15/16-07/06/16   vaginal cuff 30 Gy in 5 fractions  . Hypertension     Past Surgical History:  Procedure Laterality Date  . BUNIONECTOMY    . COLONOSCOPY N/A 04/04/2014   Procedure: COLONOSCOPY;  Surgeon: Rogene Houston, MD;  [facility];  Service: Endoscopy;  Laterality: N/A;  100  . EYE SURGERY     bil cataracts and lens implants  . FOOT SURGERY     Left  . Ovary  removed: benign    . REPLACEMENT TOTAL KNEE BILATERAL     2013  . ROBOTIC ASSISTED TOTAL HYSTERECTOMY WITH BILATERAL SALPINGO OOPHERECTOMY Left 04/13/2016   Procedure: XI ROBOTIC ASSISTED TOTAL HYSTERECTOMY WITH LEFT SALPINGO OOPHORECTOMY SENTINEL LYMPH NODE BIOPSY;  Surgeon: Everitt Amber, MD;  Location: [facility];  Service: Gynecology;  Laterality: Left;  . TOTAL KNEE REVISION Left 02/12/2016   Procedure: LEFT TOTAL KNEE REVISION;  Surgeon: Rod Can, MD;  Location: [facility];  Service: Orthopedics;  Laterality: Left;    No Known Allergies  Current Outpatient Medications on File Prior to Visit  Medication Sig Dispense Refill  . aspirin EC 81 MG tablet Take 81 mg by mouth daily.    . cholecalciferol (VITAMIN D) 400 UNITS TABS tablet Take 2,000 Units by mouth.    . docusate sodium (COLACE) 100 MG capsule Take 100 mg by mouth 2 (two) times daily.    . hydrOXYzine (ATARAX/VISTARIL) 25 MG tablet TAKE 1 TABLET EVERY 8 HOURS AS NEEDED FOR ITCHING  1  . lisinopril-hydrochlorothiazide (PRINZIDE,ZESTORETIC) 10-12.5 MG tablet Take 1 tablet by mouth daily.    . metFORMIN (GLUMETZA) 500 MG (MOD) 24 hr tablet Take 500 mg by mouth every evening.    . Multiple Vitamins-Minerals (CENTRUM SILVER ADULT 50+) TABS Take 1 tablet by mouth daily.     Marland Kitchen  naproxen (NAPROSYN) 500 MG tablet Take 500 mg by mouth 2 (two) times daily with a meal.    . omeprazole (PRILOSEC) 20 MG capsule TAKE 1 CAPSULE (20 MG TOTAL) BY MOUTH DAILY. 90 capsule 3  . polyethylene glycol (MIRALAX / GLYCOLAX) packet Take 17 g by mouth daily as needed for moderate constipation.  0  . Probiotic Product (PHILLIPS COLON HEALTH PO) Take 1 tablet by mouth daily.     . sitaGLIPtin (JANUVIA) 100 MG tablet Take 100 mg by mouth daily.    Marland Kitchen triamcinolone cream (KENALOG) 0.1 % APPLY TO AFFECTED AREA ONE TO TWO TIMES DAILY (NOT FOR FACE/SKINFOLDS)  6  . Omega-3 Fatty Acids (OMEGA 3 500 PO) Take 4 tablets by mouth.      No current facility-administered  medications on file prior to visit.         Objective:   Physical Exam Blood pressure (!) 140/58, pulse 64, temperature 98.2 F (36.8 C), height 5\' 3"  (1.6 m), weight 200 lb 6.4 oz (90.9 kg). Alert and oriented. Skin warm and dry. Oral mucosa is moist.   . Sclera anicteric, conjunctivae is pink. Thyroid not enlarged. No cervical lymphadenopathy. Lungs clear. Heart regular rate and rhythm.  Abdomen is soft. Bowel sounds are positive. No hepatomegaly. No abdominal masses felt. No tenderness.  No edema to lower extremities.  Stool brown and guaiac negative.        Assessment & Plan:  Constipation: Am going to start her on Amitiza 24mg  daily KUB OV in 4 weeks.

## 2017-08-22 NOTE — Patient Instructions (Signed)
Rx for Amitiza 11mcb OV in 4 weeks

## 2017-09-19 ENCOUNTER — Ambulatory Visit (INDEPENDENT_AMBULATORY_CARE_PROVIDER_SITE_OTHER): Payer: Medicare Other | Admitting: Internal Medicine

## 2017-09-19 ENCOUNTER — Encounter (INDEPENDENT_AMBULATORY_CARE_PROVIDER_SITE_OTHER): Payer: Self-pay | Admitting: Internal Medicine

## 2017-09-19 VITALS — BP 140/68 | HR 64 | Temp 98.6°F | Ht 63.0 in | Wt 203.0 lb

## 2017-09-19 DIAGNOSIS — K5909 Other constipation: Secondary | ICD-10-CM

## 2017-09-19 MED ORDER — LUBIPROSTONE 24 MCG PO CAPS: 24.0000 ug | ORAL_CAPSULE | Freq: Every day | ORAL | 4 refills | Status: DC

## 2017-09-19 NOTE — Patient Instructions (Signed)
Rx for Amitiza sent to her pharmacy 

## 2017-09-19 NOTE — Progress Notes (Signed)
Subjective:    Patient ID: Susan Haney, female    DOB: 12-10-1931, 84 y.o.   MRN: [id number]  HPI Here today for f/u. Last seen 4 weeks ago for constipation. She was started on Amitiza 70mcg daily. She says her BMs are better. She is having a BM every 2 days. She says last weeks she missed a few doses of Amitiza and did become constipated. Appetite is good. No weight loss. No abdominal pain. No melena or BRRB    Her last colonoscopy in September of 2015 by Dr. Laural Golden (average risk): Impression:  Examination performed to cecum. Single left-sided diverticulum and external hemorrhoids otherwise normal colonoscopy. Has recent hx of in September of 2017 (endometrial cancer):  Robotic assisted laparoscopic total hysterectomy with rt salphingoophorectomy by Dr. Denman George. She did receive radiation treatment.  Review of Systems Past Medical History:  Diagnosis Date  . Anemia    history ,  took iron for years  . Arthritis   . Bicuspid aortic valve   . Cancer [facility])    endometrial cancer  . Chronic back pain   . Diabetes ([place])    x 15 yrs with good control  . Endometrial cancer ([place])   . Family history of cancer   . Family history of prostate cancer   . GERD (gastroesophageal reflux disease)   . History of radiation therapy 06/15/16-07/06/16   vaginal cuff 30 Gy in 5 fractions  . Hypertension     Past Surgical History:  Procedure Laterality Date  . BUNIONECTOMY    . COLONOSCOPY N/A 04/04/2014   Procedure: COLONOSCOPY;  Surgeon: Rogene Houston, MD;  [facility];  Service: Endoscopy;  Laterality: N/A;  100  . EYE SURGERY     bil cataracts and lens implants  . FOOT SURGERY     Left  . Ovary removed: benign    . REPLACEMENT TOTAL KNEE BILATERAL     2013  . ROBOTIC ASSISTED TOTAL HYSTERECTOMY WITH BILATERAL SALPINGO OOPHERECTOMY Left 04/13/2016   Procedure: XI ROBOTIC ASSISTED TOTAL HYSTERECTOMY WITH LEFT SALPINGO OOPHORECTOMY SENTINEL LYMPH NODE BIOPSY;  Surgeon:  Everitt Amber, MD;  Location: [facility];  Service: Gynecology;  Laterality: Left;  . TOTAL KNEE REVISION Left 02/12/2016   Procedure: LEFT TOTAL KNEE REVISION;  Surgeon: Rod Can, MD;  Location: [facility];  Service: Orthopedics;  Laterality: Left;    No Known Allergies  Current Outpatient Medications on File Prior to Visit  Medication Sig Dispense Refill  . aspirin EC 81 MG tablet Take 81 mg by mouth daily.    . cholecalciferol (VITAMIN D) 400 UNITS TABS tablet Take 2,000 Units by mouth.    . docusate sodium (COLACE) 100 MG capsule Take 100 mg by mouth 2 (two) times daily.    . hydrOXYzine (ATARAX/VISTARIL) 25 MG tablet TAKE 1 TABLET EVERY 8 HOURS AS NEEDED FOR ITCHING  1  . lisinopril-hydrochlorothiazide (PRINZIDE,ZESTORETIC) 10-12.5 MG tablet Take 1 tablet by mouth daily.    . metFORMIN (GLUMETZA) 500 MG (MOD) 24 hr tablet Take 500 mg by mouth every evening.    . Multiple Vitamins-Minerals (CENTRUM SILVER ADULT 50+) TABS Take 1 tablet by mouth daily.     . naproxen (NAPROSYN) 500 MG tablet Take 500 mg by mouth 2 (two) times daily with a meal.    . Omega-3 Fatty Acids (OMEGA 3 500 PO) Take 4 tablets by mouth.     Marland Kitchen omeprazole (PRILOSEC) 20 MG capsule TAKE 1 CAPSULE (20 MG TOTAL) BY MOUTH  DAILY. 90 capsule 3  . polyethylene glycol (MIRALAX / GLYCOLAX) packet Take 17 g by mouth daily as needed for moderate constipation.  0  . Probiotic Product (PHILLIPS COLON HEALTH PO) Take 1 tablet by mouth daily.     Marland Kitchen triamcinolone cream (KENALOG) 0.1 % APPLY TO AFFECTED AREA ONE TO TWO TIMES DAILY (NOT FOR FACE/SKINFOLDS)  6   No current facility-administered medications on file prior to visit.         Objective:   Physical Exam Blood pressure 140/68, pulse 64, temperature 98.6 F (37 C), height 5\' 3"  (1.6 m), weight 203 lb (92.1 kg).   Alert and oriented. Skin warm and dry. Oral mucosa is moist.   . Sclera anicteric, conjunctivae is pink. Thyroid not enlarged. No cervical lymphadenopathy. Lungs  clear. Heart regular rate and rhythm.  Abdomen is soft. Bowel sounds are positive. No hepatomegaly. No abdominal masses felt. No tenderness.  No edema to lower extremities.         Assessment & Plan:  Constipation. She will continue the Amitiza.78mcg. Rx sent to her pharmacy. OV in 1 year.

## 2017-10-07 ENCOUNTER — Inpatient Hospital Stay: Payer: Medicare Other | Attending: Gynecologic Oncology | Admitting: Gynecologic Oncology

## 2017-10-07 ENCOUNTER — Encounter: Payer: Self-pay | Admitting: Gynecologic Oncology

## 2017-10-07 VITALS — BP 146/63 | HR 80 | Temp 98.7°F | Resp 20 | Wt 203.4 lb

## 2017-10-07 DIAGNOSIS — C541 Malignant neoplasm of endometrium: Secondary | ICD-10-CM | POA: Insufficient documentation

## 2017-10-07 DIAGNOSIS — N816 Rectocele: Secondary | ICD-10-CM | POA: Insufficient documentation

## 2017-10-07 DIAGNOSIS — Z923 Personal history of irradiation: Secondary | ICD-10-CM | POA: Diagnosis not present

## 2017-10-07 DIAGNOSIS — Z90722 Acquired absence of ovaries, bilateral: Secondary | ICD-10-CM

## 2017-10-07 DIAGNOSIS — Z90710 Acquired absence of both cervix and uterus: Secondary | ICD-10-CM | POA: Diagnosis not present

## 2017-10-07 NOTE — Patient Instructions (Signed)
Please notify Dr Denman George at phone number [phone] if you notice vaginal bleeding, new pelvic or abdominal pains, bloating, feeling full easy, or a change in bladder or bowel function.   To help with bowel movements, try splinting the back wall of the vagina by placing 2 fingers in the vagina and pushing towards the tailbone while having a bowel movement.  Please ask Dr Clabe Seal nurse to schedule an appointment with Dr Denman George in September, 2019.

## 2017-10-07 NOTE — Progress Notes (Signed)
Follow-up Note: Endometrial cancer   Susan Haney 84 y.o. female  Chief Complaint  Patient presents with  . Endometrial cancer [facility])  history of endometrial cancer  Assessment : 84 year old woman with stage IB grade 2 endometrioid endometrial adenocarcinoma with high/intermediate risk factors and Microsatellite instability on routine tumor testing, Lynch syndrome negative. s/p vaginal brachytherapy (completed December, 2017). No evidence for recurrence.  Plan:  I recommend she follow-up at 3 monthly intervals for symptom review, physical examination and pelvic examination. Pap smear is not recommended in routine endometrial cancer surveillance. After 2 years we will space these visits to every 6 months, and then annually if recurrence has not developed within 5 years. Next appointment is with Dr Sondra Come in June, I will plan on seeing her in September, 2019.  Rectocele - counseled regarding vaginal splinting with BM's  All questions were answered.  HPI: The patient had onset of postmenopausal bleeding approximately a month ago. This was not  associated with any pain or other symptoms. An endometrial biopsy was obtained showing a grade 1-2 endometrial adenocarcinoma. Pelvic ultrasound showed the uterus to measure 7 x 4.4 x 5.6 cm with 2 small fibroids. The left ovary surgically absent right ovary appeared normal there is no free fluid.  Today patient is having some vaginal bleeding. She also notes some increased edema and pain in her left lower extremity below the knee.  The patient has a past history of a benign left ovarian cyst which was removed laparoscopically. The patient underwent menopause at age 88. Pap smears are normal  Obstetrical history gravida 2 para 2  Comorbidities include diabetes valvular heart disease osteoporosis lumbar disc disease and obesity.  Her original surgery date had to be postponed when she acutely fell and fractured her left distal femur assoicated with  a knee replacement on 02/12/16. She required emergent repair and replacement and had been in rehab since that time.   On 04/13/16 we performed robotic assisted total hysterectomy, BSO, bilateral SLN biopsy. Final pathology revealed a 4.3cm FIGO grade 2 tumor with 10 of 52m myometrial invasion, negative LVSI and cervical stromal involvement. Bilateral SLN's were negative. 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 Genetic testing negative.  She had some delayed cuff healing (without dehiscence).  She went on to receive vaginal brachytherapy in accordance with NCCN guidelines: 06/15/16-07/06/16 30 Gy in 5 fractions to the vaginal cuff.  Interval Hx: She has no vaginal bleeding or symptoms concerning for recurrence. Though she does have difficulty evacuating stool. She has been seen by a GI for this and is on a GI regimen. Her symptoms are consistent with rectocele- difficulty emptying.   Review of Systems:10 point review of systems is negative except as noted in interval history.   Vitals: Blood pressure (!) 146/63, pulse 80, temperature 98.7 F (37.1 C), temperature source Oral, resp. rate 20, weight 203 lb 6.4 oz (92.3 kg), SpO2 97 %.  Physical Exam: General : The patient is a healthy woman in no acute distress.  HEENT: normocephalic, extraoccular movements normal; neck is supple without thyromegally  Lynphnodes: Supraclavicular and inguinal nodes not enlarged  Abdomen: Soft, non-tender, no ascites, no organomegally, no masses, no hernias. Incisions well healed. Pelvic:  EGBUS: Normal female (atrophic) Vagina: Normal, atrophic, no lesions. Cuff healing normally and in tact with no blood. Urethra and Bladder: Normal, non-tender  Cervix & uterus surgically absent. Bi-manual examination: cuff in tact. No lesions. + vaginal prolapse/relaxation consistent with rectocele Rectal: normal  sphincter tone, no masses, no blood  Lower extremities: incision healing normally on  left leg/knee with no signs of infection. Can flex to 110 degrees.      No Known Allergies  Past Medical History:  Diagnosis Date  . Anemia    history ,  took iron for years  . Arthritis   . Bicuspid aortic valve   . Cancer [facility])    endometrial cancer  . Chronic back pain   . Diabetes ([place])    x 15 yrs with good control  . Endometrial cancer ([place])   . Family history of cancer   . Family history of prostate cancer   . GERD (gastroesophageal reflux disease)   . History of radiation therapy 06/15/16-07/06/16   vaginal cuff 30 Gy in 5 fractions  . Hypertension     Past Surgical History:  Procedure Laterality Date  . BUNIONECTOMY    . COLONOSCOPY N/A 04/04/2014   Procedure: COLONOSCOPY;  Surgeon: Rogene Houston, MD;  [facility];  Service: Endoscopy;  Laterality: N/A;  100  . EYE SURGERY     bil cataracts and lens implants  . FOOT SURGERY     Left  . Ovary removed: benign    . REPLACEMENT TOTAL KNEE BILATERAL     2013  . ROBOTIC ASSISTED TOTAL HYSTERECTOMY WITH BILATERAL SALPINGO OOPHERECTOMY Left 04/13/2016   Procedure: XI ROBOTIC ASSISTED TOTAL HYSTERECTOMY WITH LEFT SALPINGO OOPHORECTOMY SENTINEL LYMPH NODE BIOPSY;  Surgeon: Everitt Amber, MD;  [facility]: [facility];  Service: Gynecology;  Laterality: Left;  . TOTAL KNEE REVISION Left 02/12/2016   Procedure: LEFT TOTAL KNEE REVISION;  Surgeon: Rod Can, MD;  Location: [facility];  Service: Orthopedics;  Laterality: Left;    Current Outpatient Medications  Medication Sig Dispense Refill  . aspirin EC 81 MG tablet Take 81 mg by mouth daily.    . cholecalciferol (VITAMIN D) 400 UNITS TABS tablet Take 2,000 Units by mouth.    . docusate sodium (COLACE) 100 MG capsule Take 100 mg by mouth 2 (two) times daily.    . hydrOXYzine (ATARAX/VISTARIL) 25 MG tablet TAKE 1 TABLET EVERY 8 HOURS AS NEEDED FOR ITCHING  1  . lisinopril-hydrochlorothiazide (PRINZIDE,ZESTORETIC) 10-12.5 MG tablet Take 1 tablet by mouth daily.    Marland Kitchen  lubiprostone (AMITIZA) 24 MCG capsule Take 1 capsule (24 mcg total) by mouth daily with breakfast. 60 capsule 4  . metFORMIN (GLUMETZA) 500 MG (MOD) 24 hr tablet Take 500 mg by mouth every evening.    . Multiple Vitamins-Minerals (CENTRUM SILVER ADULT 50+) TABS Take 1 tablet by mouth daily.     . naproxen (NAPROSYN) 500 MG tablet Take 500 mg by mouth 2 (two) times daily with a meal.    . Omega-3 Fatty Acids (OMEGA 3 500 PO) Take 4 tablets by mouth.     Marland Kitchen omeprazole (PRILOSEC) 20 MG capsule TAKE 1 CAPSULE (20 MG TOTAL) BY MOUTH DAILY. 90 capsule 3  . Probiotic Product (PHILLIPS COLON HEALTH PO) Take 1 tablet by mouth daily.     Marland Kitchen triamcinolone cream (KENALOG) 0.1 % APPLY TO AFFECTED AREA ONE TO TWO TIMES DAILY (NOT FOR FACE/SKINFOLDS)  6   No current facility-administered medications for this visit.     Social History   Socioeconomic History  . Marital status: Widowed    Spouse name: Not on file  . Number of children: 2  . Years of education: Not on file  . Highest education level: Not on file  Occupational  History  . Not on file  Social Needs  . Financial resource strain: Not on file  . Food insecurity:    Worry: Not on file    Inability: Not on file  . Transportation needs:    Medical: Not on file    Non-medical: Not on file  Tobacco Use  . Smoking status: Never Smoker  . Smokeless tobacco: Never Used  Substance and Sexual Activity  . Alcohol use: No    Alcohol/week: 0.0 oz  . Drug use: No  . Sexual activity: Not Currently  Lifestyle  . Physical activity:    Days per week: Not on file    Minutes per session: Not on file  . Stress: Not on file  Relationships  . Social connections:    Talks on phone: Not on file    Gets together: Not on file    Attends religious service: Not on file    Active member of club or organization: Not on file    Attends meetings of clubs or organizations: Not on file    Relationship status: Not on file  . Intimate partner violence:    Fear  of current or ex partner: Not on file    Emotionally abused: Not on file    Physically abused: Not on file    Forced sexual activity: Not on file  Other Topics Concern  . Not on file  Social History Narrative  . Not on file    Family History  Problem Relation Age of Onset  . Prostate cancer Father 31  . Diabetes Maternal Grandmother   . Heart attack Maternal Grandfather   . Cancer Sister        unknown primary  . Cancer Other 42       possibly pancreatic  . Cancer Other        possibly uterine or ovarian    Thereasa Solo, MD 10/07/2017, 1:43 PM  CC: Dr Octavio Graves

## 2017-12-31 DIAGNOSIS — L0390 Cellulitis, unspecified: Secondary | ICD-10-CM

## 2017-12-31 HISTORY — DX: Cellulitis, unspecified: L03.90

## 2018-01-02 ENCOUNTER — Ambulatory Visit: Payer: Self-pay | Admitting: Radiation Oncology

## 2018-01-09 ENCOUNTER — Encounter: Payer: Self-pay | Admitting: Radiation Oncology

## 2018-01-09 ENCOUNTER — Other Ambulatory Visit: Payer: Self-pay

## 2018-01-09 ENCOUNTER — Ambulatory Visit
Admission: RE | Admit: 2018-01-09 | Discharge: 2018-01-09 | Disposition: A | Discharge status: Home / Self Care | Payer: Medicare Other | Source: Ambulatory Visit | Attending: Radiation Oncology | Admitting: Radiation Oncology

## 2018-01-09 ENCOUNTER — Telehealth: Payer: Self-pay | Admitting: *Deleted

## 2018-01-09 VITALS — BP 137/64 | HR 80 | Temp 98.3°F | Resp 16 | Ht 63.0 in | Wt 201.6 lb

## 2018-01-09 DIAGNOSIS — C541 Malignant neoplasm of endometrium: Secondary | ICD-10-CM

## 2018-01-09 DIAGNOSIS — L539 Erythematous condition, unspecified: Secondary | ICD-10-CM | POA: Insufficient documentation

## 2018-01-09 DIAGNOSIS — Z08 Encounter for follow-up examination after completed treatment for malignant neoplasm: Secondary | ICD-10-CM | POA: Diagnosis not present

## 2018-01-09 DIAGNOSIS — Z8542 Personal history of malignant neoplasm of other parts of uterus: Secondary | ICD-10-CM | POA: Insufficient documentation

## 2018-01-09 HISTORY — DX: Cellulitis, unspecified: L03.90

## 2018-01-09 NOTE — Progress Notes (Signed)
Radiation Oncology         ([phone] ________________________________  Name: Susan Haney MRN: [id number]  Date: 01/09/2018  DOB: 19-Aug-1931  Follow-Up Visit Note  CC: Octavio Graves, DO  Everitt Amber, MD    ICD-10-CM   1. Endometrial cancer ([place]) C54.1     Diagnosis:   84 y.o. female with Stage IB grade 2 endometrioid adenocarcinoma with high/intermediate risk factors and microsatellite instability on routine tumor testing  Interval Since Last Radiation:  1 year 6 months Radiation treatment dates:   06/15/2016, 06/22/2016, 06/29/2016, 07/01/2016, 07/06/2016 Site/dose:  Vaginal Cuff / 30 Gy in 5 fractions  Narrative:  The patient returns today for routine follow-up. She reports difficulty with defecation. She has been seen by GI for this. Her symptoms are consistent with rectocele, and she was counseled by Dr. Denman George regarding vaginal splitting with bowel movements. She had received vaginal dilator but has stopped use per Dr. Denman George. She denies any hematuria, vaginal bleeding, or rectal bleeding. She denies nausea, vomiting, or diarrhea. She has been on Keflex with some improvement over the past 9 days for possible cellulitis in her bilateral lower extremities, which she believes is causing dysuria and vaginal itching.                     ALLERGIES:  has No Known Allergies.  Meds: Current Outpatient Medications  Medication Sig Dispense Refill  . aspirin EC 81 MG tablet Take 81 mg by mouth daily.    . cholecalciferol (VITAMIN D) 400 UNITS TABS tablet Take 2,000 Units by mouth.    . docusate sodium (COLACE) 100 MG capsule Take 100 mg by mouth 2 (two) times daily.    . hydrochlorothiazide (HYDRODIURIL) 25 MG tablet TAKE 1 TABLET BY MOUTH EVERY DAY IN THE MORNING  4  . lisinopril-hydrochlorothiazide (PRINZIDE,ZESTORETIC) 10-12.5 MG tablet Take 1 tablet by mouth daily.    Marland Kitchen lubiprostone (AMITIZA) 24 MCG capsule Take 1 capsule (24 mcg total) by mouth daily with breakfast. 60 capsule 4   . metFORMIN (GLUMETZA) 500 MG (MOD) 24 hr tablet Take 500 mg by mouth every evening.    . Multiple Vitamins-Minerals (CENTRUM SILVER ADULT 50+) TABS Take 1 tablet by mouth daily.     . naproxen (NAPROSYN) 500 MG tablet Take 500 mg by mouth 2 (two) times daily with a meal.    . Omega-3 Fatty Acids (OMEGA 3 500 PO) Take 4 tablets by mouth.     Marland Kitchen omeprazole (PRILOSEC) 20 MG capsule TAKE 1 CAPSULE (20 MG TOTAL) BY MOUTH DAILY. 90 capsule 3  . Probiotic Product (PHILLIPS COLON HEALTH PO) Take 1 tablet by mouth daily.     Marland Kitchen triamcinolone cream (KENALOG) 0.1 % APPLY TO AFFECTED AREA ONE TO TWO TIMES DAILY (NOT FOR FACE/SKINFOLDS)  6  . hydrOXYzine (ATARAX/VISTARIL) 25 MG tablet TAKE 1 TABLET EVERY 8 HOURS AS NEEDED FOR ITCHING  1  . sitaGLIPtin (JANUVIA) 100 MG tablet Januvia 100 mg tablet     No current facility-administered medications for this encounter.     Physical Findings: The patient is in no acute distress. Patient is alert and oriented.  height is _0  (1.6 m) and weight is 201 lb 9.6 oz (91.4 kg). Her oral temperature is 98.3 F (36.8 C). Her blood pressure is 137/64 and her pulse is 80. Her respiration is 16 and oxygen saturation is 91%.   Lungs are clear to auscultation bilaterally. Heart has regular rate and rhythm. No palpable cervical,  supraclavicular, or axillary adenopathy. Abdomen soft, non-tender, normal bowel sounds.  On pelvic examination the external genitalia were unremarkable. A speculum exam was performed. There are no mucosal lesions noted in the vaginal vault. On bimanual and rectovaginal examination there were no pelvic masses appreciated.  No signs of yeast infection. Some erythema noted to the lower legs bilaterally.  Lab Findings: Lab Results  Component Value Date   WBC 13.2 (H) 04/14/2016   HGB 9.4 (L) 04/14/2016   HCT 29.8 (L) 04/14/2016   MCV 88.2 04/14/2016   PLT 288 04/14/2016    Radiographic Findings: No results found.  Impression:  No evidence of  recurrence on clinical exam.   Plan:  Follow up with Dr. Denman George in 3 months. Routine follow-up in radiation oncology in 6 months. She will follow-up with her primary care physician concerning her issues with cellulitis  ____________________________________  Blair Promise, PhD, MD  This document serves as a record of services personally performed by Gery Pray, MD. It was created on his behalf by Rae Lips, a trained medical scribe. The creation of this record is based on the scribe's personal observations and the provider's statements to them. This document has been checked and approved by the attending provider.

## 2018-01-09 NOTE — Telephone Encounter (Signed)
CALLED PATIENT TO INFORM OF FU WITH DR. Denman George ON 04-17-18 - ARRIVAL TIME - 1:15 PM, LVM FOR A RETURN CALL

## 2018-01-09 NOTE — Progress Notes (Signed)
Pt here for follow up with Dr. Sondra Come. Pt denies any hematuria, vaginal bleeding, or rectal bleeding. Pt denies diarrhea. Pt denies nausea/vomiting. Pt on Keflex for possible cellulitis in BLE, which pt believes is causing dysuria and vaginal itching. Pt had received vaginal dilator but pt stopped use per Dr. Denman George.   Blood pressure 137/64, pulse 80, temperature 98.3 F (36.8 C), temperature source Oral, resp. rate 16, height 5\' 3"  (1.6 m), weight 201 lb 9.6 oz (91.4 kg), SpO2 91 %.  Wt Readings from Last 3 Encounters:  01/09/18 201 lb 9.6 oz (91.4 kg)  10/07/17 203 lb 6.4 oz (92.3 kg)  09/19/17 203 lb (92.1 kg)

## 2018-03-13 ENCOUNTER — Encounter: Payer: Self-pay | Admitting: Physical Therapy

## 2018-03-13 ENCOUNTER — Ambulatory Visit: Payer: Medicare Other | Attending: *Deleted | Admitting: Physical Therapy

## 2018-03-13 ENCOUNTER — Other Ambulatory Visit: Payer: Self-pay

## 2018-03-13 DIAGNOSIS — M25561 Pain in right knee: Secondary | ICD-10-CM | POA: Insufficient documentation

## 2018-03-13 DIAGNOSIS — M25562 Pain in left knee: Secondary | ICD-10-CM | POA: Diagnosis present

## 2018-03-13 DIAGNOSIS — G8929 Other chronic pain: Secondary | ICD-10-CM | POA: Diagnosis present

## 2018-03-13 DIAGNOSIS — R262 Difficulty in walking, not elsewhere classified: Secondary | ICD-10-CM | POA: Insufficient documentation

## 2018-03-13 DIAGNOSIS — M545 Low back pain: Secondary | ICD-10-CM | POA: Insufficient documentation

## 2018-03-13 NOTE — Therapy (Signed)
[facility] [place], [place], [address] Phone: [phone]   Fax:  [phone]  Physical Therapy Evaluation  Patient Details  Name: Susan Haney MRN: [id number] Date of Birth: 1932-07-05 Referring Provider: Octavio Graves, DO   Encounter Date: 03/13/2018  PT End of Session - 03/13/18 1950    Visit Number  1    Number of Visits  12    Date for PT Re-Evaluation  05/01/18    Authorization Type  progress note every 10th visit, kx modifier at 15th visit    PT Start Time  1300    PT Stop Time  1345    PT Time Calculation (min)  45 min    Activity Tolerance  Patient limited by pain    Behavior During Therapy  [facility] for tasks assessed/performed       Past Medical History:  Diagnosis Date  . Anemia    history ,  took iron for years  . Arthritis   . Bicuspid aortic valve   . Cancer [facility])    endometrial cancer  . Cellulitis 12/31/2017   BLE, ankles  . Chronic back pain   . Diabetes ([place])    x 15 yrs with good control  . Endometrial cancer ([place])   . Family history of cancer   . Family history of prostate cancer   . GERD (gastroesophageal reflux disease)   . History of radiation therapy 06/15/16-07/06/16   vaginal cuff 30 Gy in 5 fractions  . Hypertension     Past Surgical History:  Procedure Laterality Date  . BUNIONECTOMY    . COLONOSCOPY N/A 04/04/2014   Procedure: COLONOSCOPY;  Surgeon: Rogene Houston, MD;  [facility];  Service: Endoscopy;  Laterality: N/A;  100  . EYE SURGERY     bil cataracts and lens implants  . FOOT SURGERY     Left  . Ovary removed: benign    . REPLACEMENT TOTAL KNEE BILATERAL     2013  . ROBOTIC ASSISTED TOTAL HYSTERECTOMY WITH BILATERAL SALPINGO OOPHERECTOMY Left 04/13/2016   Procedure: XI ROBOTIC ASSISTED TOTAL HYSTERECTOMY WITH LEFT SALPINGO OOPHORECTOMY SENTINEL LYMPH NODE BIOPSY;  Surgeon: Everitt Amber, MD;  [facility];  Service: Gynecology;  Laterality: Left;  .  TOTAL KNEE REVISION Left 02/12/2016   Procedure: LEFT TOTAL KNEE REVISION;  Surgeon: Rod Can, MD;  [facility];  Service: Orthopedics;  Laterality: Left;    There were no vitals filed for this visit.   Subjective Assessment - 03/13/18 1312    Subjective  Patient arrives to physical therapy with ongoing low back pain and bilateral knee pain L>R. Patient stated pain is secondary to a fall in 2017 and pain has exacerbated over the past few months which limits her ability to walk and perform ADLs. Patient reports feeling like her left knee is going to "give way" while walking. Patient's L knee pain at best is 5/10; pain at worst is 10/10 with increased activity. Patient reports right sided low back pain that radiates to both glutes and ischial tuberosities. Patient reports back pain limits sitting, standing, and walking. Patient's MRI states mild to severe spinal stenosis throughout multiple levels of her lumbar spine. Patient's goals are to decrease pain, improve movement and improve ability to perform house hold activities.    Limitations  Sitting;Walking;Standing;House hold activities    How long can you sit comfortably?  30 minutes    How long can you stand comfortably?  20 minutes  How long can you walk comfortably?  30 minutes     Diagnostic tests  MRI; X-Ray    Currently in Pain?  Yes    Pain Score  7     Pain Location  Back    Pain Orientation  Right;Lower    Pain Descriptors / Indicators  Sore    Pain Type  Chronic pain    Pain Radiating Towards  bilateral glutes    Pain Onset  More than a month ago    Pain Frequency  Constant    Aggravating Factors   increased activity    Pain Relieving Factors  nothing    Multiple Pain Sites  --    Pain Score  5    Pain Location  Knee    Pain Orientation  Left    Pain Descriptors / Indicators  Sore    Pain Type  Chronic pain    Pain Onset  More than a month ago    Aggravating Factors   incrased activity    Pain Relieving Factors   nothing         [facility] PT Assessment - 03/13/18 0001      Assessment   Medical Diagnosis  Post-traumatic arthritis of L knee, Lumbar DDD, OA of right knee    Referring Provider  Octavio Graves, DO    Onset Date/Surgical Date  --   ongoing   Next MD Visit  n/a    Prior Therapy  yes      Precautions   Precautions  None      Restrictions   Weight Bearing Restrictions  No      Balance Screen   Has the patient fallen in the past 6 months  No    Has the patient had a decrease in activity level because of a fear of falling?   No    Is the patient reluctant to leave their home because of a fear of falling?   No      Home Environment   Living Environment  Private residence    Living Arrangements  Alone    Type of [place] Access  Level entry    Home Layout  Laundry or work area in basement      Prior Function   Level of [place]  Retired      Observation/Other Assessments   Observations  L quad atrophy in comparison to right      Observation/Other Assessments-Edema    Edema  Circumferential      Circumferential Edema   Circumferential - Right  44.5 cm at mid patelaa    Circumferential - Left   47.5 cm at mid patella      ROM / Strength   AROM / PROM / Strength  AROM;Strength      AROM   AROM Assessment Site  Knee    Right/Left Knee  Left;Right    Right Knee Extension  5    Right Knee Flexion  120    Left Knee Extension  0    Left Knee Flexion  105      Strength   Overall Strength Comments  pain with L knee MMT in all planes    Strength Assessment Site  Hip;Knee    Right/Left Hip  Right;Left    Right Hip Flexion  4-/5    Right Hip Extension  3+/5    Right Hip ABduction  3+/5  Left Hip Flexion  3+/5    Left Hip Extension  3/5    Left Hip ABduction  3+/5    Right/Left Knee  Right;Left    Right Knee Flexion  4-/5    Right Knee Extension  4-/5    Left Knee Flexion  4/5    Left Knee Extension  3/5      Palpation    Palpation comment  tender to palpation along R lumbar paraspinals and R SIJ      Transfers   Transfers  Supine to Sit    Supine to Sit  5: Supervision    Comments  increased time to perform activity, no log rolling performed      Ambulation/Gait   Assistive device  Straight cane    Gait Pattern  Step-to pattern;Step-through pattern;Decreased stance time - left;Decreased stride length;Decreased weight shift to left;Antalgic;Trunk flexed;Wide base of support                Objective measurements completed on examination: See above findings.              PT Education - 03/13/18 1401    Education Details  draw ins, quad sets, supine marching    Person(s) Educated  Patient    Methods  Explanation;Handout    Comprehension  Verbalized understanding       PT Short Term Goals - 03/13/18 1952      PT SHORT TERM GOAL #1   Title  STG's=LTG's.        PT Long Term Goals - 03/13/18 1952      PT LONG TERM GOAL #1   Title  Patient will be independent with HEP    Time  6    Period  Weeks    Status  New      PT LONG TERM GOAL #2   Title  Patient will demonstrate 115 degrees or greater of L knee flexion to improve ability to perform functional tasks.    Time  6    Period  Weeks    Status  New      PT LONG TERM GOAL #3   Title  Patient will increase bilateral LE strength to 4/5 or greater to improve stability during functional tasks.    Time  6    Period  Weeks    Status  New      PT LONG TERM GOAL #4   Title  Patient will reports ability to perform ADLs with less than 4/10 low back pain.    Time  6    Period  Weeks    Status  New      PT LONG TERM GOAL #5   Title                Plan - 03/13/18 1403    Clinical Impression Statement  Patient is an 84 year old female who presents to physical therapy with bilateral knee pain, decreased L knee ROM, and decreased LE strength. Patient noted with increased left knee crepitus with ROM and increased right  knee edema. Patient noted with left quadricep atrophy in comparison to the right. Patient noted with tenderness to palpation long right lumbar paraspinals and in R SIJ. Patient noted with antalgic gait pattern, decreased L stance time and increased spinal flexion with SPC. Patient would benefit from skilled physical therapy to address deficits and address patient's goals.     Clinical Presentation  Evolving    Clinical Presentation due to:  not improving    Clinical Decision Making  Moderate    Rehab Potential  Good    PT Frequency  2x / week    PT Duration  6 weeks    PT Treatment/Interventions  ADLs/Self Care Home Management;Gait training;Stair training;Therapeutic activities;Therapeutic exercise;Electrical Stimulation;Cryotherapy;Moist Heat;Ultrasound;Iontophoresis 56m/ml Dexamethasone;Neuromuscular re-education;Patient/family education;Taping;Vasopneumatic Device;Passive range of motion;Manual techniques;Dry needling    PT Next Visit Plan  nustep, gentle pain free quad strengthening, core strengthening, STW/M to right low back,  modalities PRN for pain relief    Consulted and Agree with Plan of Care  Patient       Patient will benefit from skilled therapeutic intervention in order to improve the following deficits and impairments:  Pain, Postural dysfunction, Decreased strength, Decreased range of motion, Decreased endurance, Decreased activity tolerance, Decreased balance, Difficulty walking, Increased edema  Visit Diagnosis: Chronic pain of left knee - Plan: PT plan of care cert/re-cert  Chronic pain of right knee - Plan: PT plan of care cert/re-cert  Chronic right-sided low back pain, with sciatica presence unspecified - Plan: PT plan of care cert/re-cert  Difficulty in walking, not elsewhere classified - Plan: PT plan of care cert/re-cert     Problem List Patient Active Problem List   Diagnosis Date Noted  . Genetic testing 01/07/2017  . Family history of prostate cancer   .  Family history of cancer   . 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 [place] 04/13[place]  . Closed fracture of left distal femur, initial encounter 02/13/2016  . GERD (gastroesophageal reflux disease) 06/30/2015  . Chronic lower back pain 06/18/2014  . [place] 02/21/2014  . Essential hypertension, benign 02/21/2014  . Constipation 02/21/2014   KGabriela Eves PT, DPT 03/13/2018, 8:20 PM  [facility]MDunnav[place] [address]Phone: [phone]  Fax:  [phone] Name: Susan EDGARMRN: [id number] of Birth: [birth year]

## 2018-03-15 ENCOUNTER — Encounter: Payer: Self-pay | Admitting: Physical Therapy

## 2018-03-15 ENCOUNTER — Ambulatory Visit: Payer: Medicare Other | Admitting: Physical Therapy

## 2018-03-15 DIAGNOSIS — M25562 Pain in left knee: Principal | ICD-10-CM

## 2018-03-15 DIAGNOSIS — M545 Low back pain: Secondary | ICD-10-CM

## 2018-03-15 DIAGNOSIS — G8929 Other chronic pain: Secondary | ICD-10-CM

## 2018-03-15 DIAGNOSIS — R262 Difficulty in walking, not elsewhere classified: Secondary | ICD-10-CM

## 2018-03-15 DIAGNOSIS — M25561 Pain in right knee: Secondary | ICD-10-CM

## 2018-03-15 NOTE — Therapy (Signed)
[facility] [place], [place], [address] Phone: [phone]   Fax:  [phone]  Physical Therapy Treatment  Patient Details  Name: Susan Haney MRN: [id number] Date of Birth: 1931/11/26 Referring Provider: Octavio Graves, DO   Encounter Date: 03/15/2018  PT End of Session - 03/15/18 0916    Visit Number  2    Number of Visits  12    Date for PT Re-Evaluation  05/01/18    Authorization Type  progress note every 10th visit, kx modifier at 15th visit    PT Start Time  0903    PT Stop Time  0953    PT Time Calculation (min)  50 min    Activity Tolerance  Patient limited by pain    Behavior During Therapy  [facility] for tasks assessed/performed       Past Medical History:  Diagnosis Date  . Anemia    history ,  took iron for years  . Arthritis   . Bicuspid aortic valve   . Cancer [place])    endometrial cancer  . Cellulitis 12/31/2017   BLE, ankles  . Chronic back pain   . Diabetes ([place])    x 15 yrs with good control  . Endometrial cancer ([place])   . Family history of cancer   . Family history of prostate cancer   . GERD (gastroesophageal reflux disease)   . History of radiation therapy 06/15/16-07/06/16   vaginal cuff 30 Gy in 5 fractions  . Hypertension     Past Surgical History:  Procedure Laterality Date  . BUNIONECTOMY    . COLONOSCOPY N/A 04/04/2014   Procedure: COLONOSCOPY;  Surgeon: Rogene Houston, MD;  [facility];  Service: Endoscopy;  Laterality: N/A;  100  . EYE SURGERY     bil cataracts and lens implants  . FOOT SURGERY     Left  . Ovary removed: benign    . REPLACEMENT TOTAL KNEE BILATERAL     2013  . ROBOTIC ASSISTED TOTAL HYSTERECTOMY WITH BILATERAL SALPINGO OOPHERECTOMY Left 04/13/2016   Procedure: XI ROBOTIC ASSISTED TOTAL HYSTERECTOMY WITH LEFT SALPINGO OOPHORECTOMY SENTINEL LYMPH NODE BIOPSY;  Surgeon: Everitt Amber, MD;  [facility];  Service: Gynecology;  Laterality: Left;  .  TOTAL KNEE REVISION Left 02/12/2016   Procedure: LEFT TOTAL KNEE REVISION;  Surgeon: Rod Can, MD;  [facility];  Service: Orthopedics;  Laterality: Left;    There were no vitals filed for this visit.  Subjective Assessment - 03/15/18 0912    Subjective  Reports being sore overall from the exercises provided by PT. Brought in her NMES stim machine from MD as it has not been working properly.    Limitations  Sitting;Walking;Standing;House hold activities    How long can you sit comfortably?  30 minutes    How long can you stand comfortably?  20 minutes    How long can you walk comfortably?  30 minutes     Diagnostic tests  MRI; X-Ray    Currently in Pain?  Yes    Pain Score  7     Pain Location  Generalized    Pain Descriptors / Indicators  Sore    Pain Type  Chronic pain    Pain Onset  More than a month ago         [facility] PT Assessment - 03/15/18 0001      Assessment   Medical Diagnosis  Post-traumatic arthritis of L knee, Lumbar DDD,  OA of right knee    Next MD Visit  n/a    Prior Therapy  yes      Precautions   Precautions  None      Restrictions   Weight Bearing Restrictions  No                   [facility] Adult PT Treatment/Exercise - 03/15/18 0001      Exercises   Exercises  Knee/Hip;Lumbar      Lumbar Exercises: Supine   Clam  15 reps;Limitations    Clam Limitations  yellow theraband    Bent Knee Raise  20 reps    Bridge  15 reps      Knee/Hip Exercises: Aerobic   Nustep  L3 x17 min      Knee/Hip Exercises: Standing   Heel Raises  Both;15 reps    Hip Flexion  AROM;Both;15 reps;Knee bent   with core   Hip Abduction  AROM;Both;20 reps;Knee straight      Knee/Hip Exercises: Supine   Short Arc Quad Sets  AROM;Left;10 reps   Stopped due to pain     Modalities   Modalities  Electrical Stimulation;Moist Heat      Moist Heat Therapy   Number Minutes Moist Heat  15 Minutes    Moist Heat Location  Lumbar Spine      Electrical  Stimulation   Electrical Stimulation Location  R posterior hip    Electrical Stimulation Action  Pre-Mod    Electrical Stimulation Parameters  80-150 hz x15 min    Electrical Stimulation Goals  Pain               PT Short Term Goals - 03/13/18 1952      PT SHORT TERM GOAL #1   Title  STG's=LTG's.        PT Long Term Goals - 03/13/18 1952      PT LONG TERM GOAL #1   Title  Patient will be independent with HEP    Time  6    Period  Weeks    Status  New      PT LONG TERM GOAL #2   Title  Patient will demonstrate 115 degrees or greater of L knee flexion to improve ability to perform functional tasks.    Time  6    Period  Weeks    Status  New      PT LONG TERM GOAL #3   Title  Patient will increase bilateral LE strength to 4/5 or greater to improve stability during functional tasks.    Time  6    Period  Weeks    Status  New      PT LONG TERM GOAL #4   Title  Patient will reports ability to perform ADLs with less than 4/10 low back pain.    Time  6    Period  Weeks    Status  New      PT LONG TERM GOAL #5   Title               Plan - 03/15/18 1018    Clinical Impression Statement  Patient tolerated today's treatment fairly well as she arrived with overall soreness from exercises. Patient limited with L knee exercises secondary to pain and weakness. Core activation added into all exercises today. Patient brought in NMES machine from her MD but instructed to call customer service number as it has not been working properly. Normal modalities response  noted following removal of the modalities.    Rehab Potential  Good    PT Frequency  2x / week    PT Duration  6 weeks    PT Treatment/Interventions  ADLs/Self Care Home Management;Gait training;Stair training;Therapeutic activities;Therapeutic exercise;Electrical Stimulation;Cryotherapy;Moist Heat;Ultrasound;Iontophoresis 29m/ml Dexamethasone;Neuromuscular re-education;Patient/family  education;Taping;Vasopneumatic Device;Passive range of motion;Manual techniques;Dry needling    PT Next Visit Plan  nustep, gentle pain free quad strengthening, core strengthening, STW/M to right low back,  modalities PRN for pain relief    Consulted and Agree with Plan of Care  Patient       Patient will benefit from skilled therapeutic intervention in order to improve the following deficits and impairments:  Pain, Postural dysfunction, Decreased strength, Decreased range of motion, Decreased endurance, Decreased activity tolerance, Decreased balance, Difficulty walking, Increased edema  Visit Diagnosis: Chronic pain of left knee  Chronic pain of right knee  Chronic right-sided low back pain, with sciatica presence unspecified  Difficulty in walking, not elsewhere classified     Problem List Patient Active Problem List   Diagnosis Date Noted  . Genetic testing 01/07/2017  . Family history of prostate cancer   . Family history of cancer   . 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 [place]ometrial [place] 04/13/2016  . Closed fracture of left distal femur, initial encounter 02/13/2016  . GERD (gastroesophageal reflux disease) 06/30/2015  . Chronic lower back pain 06/18/2014  . Diabetes ([place] 02/21/2014  . Essential hypertension, benign 02/21/2014  . Constipation 02/21/2014    KStandley Brooking PTA 03/15/2018, 10:26 AM  [facility]49[address]MLyer[address] [place] [address]Phone: [phone]  Fax:  [phone] Name: Susan TIENMRN: [id number] of Birth: 3Feb 11, 1933

## 2018-03-21 ENCOUNTER — Encounter: Payer: Self-pay | Admitting: Physical Therapy

## 2018-03-21 ENCOUNTER — Ambulatory Visit: Payer: Medicare Other | Attending: *Deleted | Admitting: Physical Therapy

## 2018-03-21 DIAGNOSIS — G8929 Other chronic pain: Secondary | ICD-10-CM | POA: Diagnosis present

## 2018-03-21 DIAGNOSIS — M25561 Pain in right knee: Secondary | ICD-10-CM | POA: Diagnosis present

## 2018-03-21 DIAGNOSIS — M25562 Pain in left knee: Secondary | ICD-10-CM | POA: Insufficient documentation

## 2018-03-21 DIAGNOSIS — M545 Low back pain: Secondary | ICD-10-CM | POA: Diagnosis present

## 2018-03-21 DIAGNOSIS — R262 Difficulty in walking, not elsewhere classified: Secondary | ICD-10-CM | POA: Diagnosis present

## 2018-03-21 NOTE — Therapy (Signed)
[facility] [place], [place], [address] Phone: [phone]   Fax:  [phone]  Physical Therapy Treatment  Patient Details  Name: Susan Haney MRN: [id number] Date of Birth: 1932-01-01 Referring Provider: Octavio Graves, DO   Encounter Date: 03/21/2018  PT End of Session - 03/21/18 1144    Visit Number  3    Number of Visits  12    Date for PT Re-Evaluation  05/01/18    Authorization Type  progress note every 10th visit, kx modifier at 15th visit    PT Start Time  1032    PT Stop Time  1133    PT Time Calculation (min)  61 min    Activity Tolerance  Patient tolerated treatment well    Behavior During Therapy  [facility] for tasks assessed/performed       Past Medical History:  Diagnosis Date  . Anemia    history ,  took iron for years  . Arthritis   . Bicuspid aortic valve   . Cancer [facility])    endometrial cancer  . Cellulitis 12/31/2017   BLE, ankles  . Chronic back pain   . Diabetes ([place])    x 15 yrs with good control  . Endometrial cancer ([place])   . Family history of cancer   . Family history of prostate cancer   . GERD (gastroesophageal reflux disease)   . History of radiation therapy 06/15/16-07/06/16   vaginal cuff 30 Gy in 5 fractions  . Hypertension     Past Surgical History:  Procedure Laterality Date  . BUNIONECTOMY    . COLONOSCOPY N/A 04/04/2014   Procedure: COLONOSCOPY;  Surgeon: Rogene Houston, MD;  [facility];  Service: Endoscopy;  Laterality: N/A;  100  . EYE SURGERY     bil cataracts and lens implants  . FOOT SURGERY     Left  . Ovary removed: benign    . REPLACEMENT TOTAL KNEE BILATERAL     2013  . ROBOTIC ASSISTED TOTAL HYSTERECTOMY WITH BILATERAL SALPINGO OOPHERECTOMY Left 04/13/2016   Procedure: XI ROBOTIC ASSISTED TOTAL HYSTERECTOMY WITH LEFT SALPINGO OOPHORECTOMY SENTINEL LYMPH NODE BIOPSY;  Surgeon: Everitt Amber, MD;  [facility];  Service: Gynecology;  Laterality:  Left;  . TOTAL KNEE REVISION Left 02/12/2016   Procedure: LEFT TOTAL KNEE REVISION;  Surgeon: Rod Can, MD;  [facility];  Service: Orthopedics;  Laterality: Left;    There were no vitals filed for this visit.  Subjective Assessment - 03/21/18 1029    Subjective  Reports being very sore with exercises. Wore compression hoses for swelling and now has a blister along L lower tibia.     Limitations  Sitting;Walking;Standing;House hold activities    How long can you sit comfortably?  30 minutes    How long can you stand comfortably?  20 minutes    How long can you walk comfortably?  30 minutes     Diagnostic tests  MRI; X-Ray    Currently in Pain?  Other (Comment)   No pain assessment provided although she reported discomfort        [facility] PT Assessment - 03/21/18 0001      Assessment   Medical Diagnosis  Post-traumatic arthritis of L knee, Lumbar DDD, OA of right knee    Next MD Visit  n/a    Prior Therapy  yes      Precautions   Precautions  None      Restrictions  Weight Bearing Restrictions  No                   [facility] Adult PT Treatment/Exercise - 03/21/18 0001      Exercises   Exercises  Knee/Hip;Lumbar      Knee/Hip Exercises: Aerobic   Nustep  L5 x19 min      Knee/Hip Exercises: Supine   Short Arc Quad Sets  Left;Other (comment)   with VMS to L VMO, Quad to faciliate neuro re-ed of L quad     Modalities   Modalities  Electrical Stimulation;Moist Heat      Moist Heat Therapy   Number Minutes Moist Heat  15 Minutes    Moist Heat Location  Lumbar Spine      Electrical Stimulation   Electrical Stimulation Location  L VMO,Quad; Low back    Electrical Stimulation Action  VMS; Pre-mod    Electrical Stimulation Parameters  10/10, x15 min with SAQ; 80-150 hz x15 min    Electrical Stimulation Goals  Neuromuscular facilitation;Pain               PT Short Term Goals - 03/13/18 1952      PT SHORT TERM GOAL #1   Title  STG's=LTG's.         PT Long Term Goals - 03/13/18 1952      PT LONG TERM GOAL #1   Title  Patient will be independent with HEP    Time  6    Period  Weeks    Status  New      PT LONG TERM GOAL #2   Title  Patient will demonstrate 115 degrees or greater of L knee flexion to improve ability to perform functional tasks.    Time  6    Period  Weeks    Status  New      PT LONG TERM GOAL #3   Title  Patient will increase bilateral LE strength to 4/5 or greater to improve stability during functional tasks.    Time  6    Period  Weeks    Status  New      PT LONG TERM GOAL #4   Title  Patient will reports ability to perform ADLs with less than 4/10 low back pain.    Time  6    Period  Weeks    Status  New      PT LONG TERM GOAL #5   Title               Plan - 03/21/18 1140    Clinical Impression Statement  Patient tolerated today's treatment well although reporting discomfort intermittantly but especially with sitting. Patient encouraged to continue HEP at least 2x per day. VMS completed to L quad and VMO to faciliate neuro re-ed of the L quad and improve overall strength. Minimal twitch noted in L quad with VMS session. Normal modalities response noted following removal of the modalities. Patient cautioned to be careful surrounding the L tibia blister that has presented.    Rehab Potential  Good    PT Frequency  2x / week    PT Duration  6 weeks    PT Treatment/Interventions  ADLs/Self Care Home Management;Gait training;Stair training;Therapeutic activities;Therapeutic exercise;Electrical Stimulation;Cryotherapy;Moist Heat;Ultrasound;Iontophoresis 50m/ml Dexamethasone;Neuromuscular re-education;Patient/family education;Taping;Vasopneumatic Device;Passive range of motion;Manual techniques;Dry needling    PT Next Visit Plan  nustep, gentle pain free quad strengthening, core strengthening, STW/M to right low back,  modalities PRN for pain relief  Consulted and Agree with Plan of Care   Patient       Patient will benefit from skilled therapeutic intervention in order to improve the following deficits and impairments:  Pain, Postural dysfunction, Decreased strength, Decreased range of motion, Decreased endurance, Decreased activity tolerance, Decreased balance, Difficulty walking, Increased edema  Visit Diagnosis: Chronic pain of left knee  Chronic pain of right knee  Chronic right-sided low back pain, with sciatica presence unspecified  Difficulty in walking, not elsewhere classified     Problem List Patient Active Problem List   Diagnosis Date Noted  . Genetic testing 01/07/2017  . Family history of prostate cancer   . Family history of cancer   . 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 Endometrial cancer ([place]) 04/13/2016  . Closed fracture of left distal femur, initial encounter 02/13/2016  . GERD (gastroesophageal reflux disease) 06/30/2015  . Chronic lower back pain 06/18/2014  . Diabetes ([place]) 02/21/2014  . Essential hypertension, benign 02/21/2014  . Constipation 02/21/2014    Standley Brooking, PTA 03/21/2018, 11:46 AM  [facility] [address] [place], [place], [address] Phone: [phone]   Fax:  [phone]  Name: LENIYAH MARTELL MRN: [id number] Date of Birth: 1932/04/03

## 2018-03-24 ENCOUNTER — Encounter: Payer: Self-pay | Admitting: Physical Therapy

## 2018-03-24 ENCOUNTER — Ambulatory Visit: Payer: Medicare Other | Admitting: Physical Therapy

## 2018-03-24 DIAGNOSIS — M25562 Pain in left knee: Principal | ICD-10-CM

## 2018-03-24 DIAGNOSIS — R262 Difficulty in walking, not elsewhere classified: Secondary | ICD-10-CM

## 2018-03-24 DIAGNOSIS — M545 Low back pain: Secondary | ICD-10-CM

## 2018-03-24 DIAGNOSIS — G8929 Other chronic pain: Secondary | ICD-10-CM

## 2018-03-24 DIAGNOSIS — M25561 Pain in right knee: Secondary | ICD-10-CM

## 2018-03-24 NOTE — Therapy (Signed)
[facility] [place], [place], [address] Phone: [phone]   Fax:  [phone]  Physical Therapy Treatment  Patient Details  Name: Susan Haney MRN: [id number] Date of Birth: Jun 27, 1932 Referring Provider: Octavio Graves, DO   Encounter Date: 03/24/2018  PT End of Session - 03/24/18 1032    Visit Number  4    Number of Visits  12    Date for PT Re-Evaluation  05/01/18    Authorization Type  progress note every 10th visit, kx modifier at 15th visit    PT Start Time  1031    PT Stop Time  1128    PT Time Calculation (min)  57 min    Activity Tolerance  Patient tolerated treatment well    Behavior During Therapy  [facility] for tasks assessed/performed       Past Medical History:  Diagnosis Date  . Anemia    history ,  took iron for years  . Arthritis   . Bicuspid aortic valve   . Cancer [facility])    endometrial cancer  . Cellulitis 12/31/2017   BLE, ankles  . Chronic back pain   . Diabetes ([place])    x 15 yrs with good control  . Endometrial cancer ([place])   . Family history of cancer   . Family history of prostate cancer   . GERD (gastroesophageal reflux disease)   . History of radiation therapy 06/15/16-07/06/16   vaginal cuff 30 Gy in 5 fractions  . Hypertension     Past Surgical History:  Procedure Laterality Date  . BUNIONECTOMY    . COLONOSCOPY N/A 04/04/2014   Procedure: COLONOSCOPY;  Surgeon: Rogene Houston, MD;  [facility];  Service: Endoscopy;  Laterality: N/A;  100  . EYE SURGERY     bil cataracts and lens implants  . FOOT SURGERY     Left  . Ovary removed: benign    . REPLACEMENT TOTAL KNEE BILATERAL     2013  . ROBOTIC ASSISTED TOTAL HYSTERECTOMY WITH BILATERAL SALPINGO OOPHERECTOMY Left 04/13/2016   Procedure: XI ROBOTIC ASSISTED TOTAL HYSTERECTOMY WITH LEFT SALPINGO OOPHORECTOMY SENTINEL LYMPH NODE BIOPSY;  Surgeon: Everitt Amber, MD;  [facility];  Service: Gynecology;  Laterality:  Left;  . TOTAL KNEE REVISION Left 02/12/2016   Procedure: LEFT TOTAL KNEE REVISION;  Surgeon: Rod Can, MD;  [facility];  Service: Orthopedics;  Laterality: Left;    There were no vitals filed for this visit.  Subjective Assessment - 03/24/18 1024    Subjective  Reports being more sore in LEs with her restirator at home.    Limitations  Sitting;Walking;Standing;House hold activities    How long can you sit comfortably?  30 minutes    How long can you stand comfortably?  20 minutes    How long can you walk comfortably?  30 minutes     Diagnostic tests  MRI; X-Ray    Currently in Pain?  Yes    Pain Score  5     Pain Location  Knee    Pain Orientation  Left    Pain Descriptors / Indicators  Discomfort    Pain Type  Chronic pain    Pain Onset  More than a month ago    Pain Score  4    Pain Location  Back    Pain Orientation  Right;Lower    Pain Descriptors / Indicators  Sore;Discomfort    Pain Type  Chronic pain  Pain Onset  More than a month ago         [facility] PT Assessment - 03/24/18 0001      Assessment   Medical Diagnosis  Post-traumatic arthritis of L knee, Lumbar DDD, OA of right knee    Next MD Visit  n/a    Prior Therapy  yes      Precautions   Precautions  None      Restrictions   Weight Bearing Restrictions  No                   [facility] Adult PT Treatment/Exercise - 03/24/18 0001      Exercises   Exercises  Knee/Hip;Lumbar      Lumbar Exercises: Supine   Clam  15 reps;Limitations    Clam Limitations  red theraband    Bridge  15 reps      Knee/Hip Exercises: Aerobic   Nustep  L5 x15 min      Knee/Hip Exercises: Standing   Hip Flexion  AROM;Both;15 reps;Knee bent   VCs for posture and core activation   Hip Abduction  AROM;Both;20 reps;Knee straight   VCs for posture and core activation     Knee/Hip Exercises: Supine   Short Arc Quad Sets  Left;Other (comment)   VMS to L VMO/Quad for neuro re-ed of L quad     Modalities    Modalities  Electrical Stimulation;Moist Heat      Moist Heat Therapy   Number Minutes Moist Heat  15 Minutes    Moist Heat Location  Lumbar Spine      Electrical Stimulation   Electrical Stimulation Location  L VMO,Quad; Low back    Electrical Stimulation Action  VMS; Pre-Mod    Electrical Stimulation Parameters  10/10, 50 pps, 5 sec ramp, 300 usec x10 min80-150 hz x15 min    Electrical Stimulation Goals  Neuromuscular facilitation;Pain               PT Short Term Goals - 03/13/18 1952      PT SHORT TERM GOAL #1   Title  STG's=LTG's.        PT Long Term Goals - 03/24/18 1127      PT LONG TERM GOAL #1   Title  Patient will be independent with HEP    Time  6    Period  Weeks    Status  Achieved      PT LONG TERM GOAL #2   Title  Patient will demonstrate 115 degrees or greater of L knee flexion to improve ability to perform functional tasks.    Time  6    Period  Weeks    Status  On-going      PT LONG TERM GOAL #3   Title  Patient will increase bilateral LE strength to 4/5 or greater to improve stability during functional tasks.    Time  6    Period  Weeks    Status  On-going      PT LONG TERM GOAL #4   Title  Patient will reports ability to perform ADLs with less than 4/10 low back pain.    Time  6    Period  Weeks    Status  On-going      PT LONG TERM GOAL #5   Title               Plan - 03/24/18 1116    Clinical Impression Statement  Patient tolerated today's treatment fairly  well although she arrived with moderate L knee and LBP today. Patient encouraged throughout exercises for core activation and proper posture in standing. Lack of L quad activation still noted with exercises and a minimal contraction of L quad noted with VMS. Low level lumbar strengthening exercises completed with patient reporting compliance with HEP at home. Normal modalities response noted following removal of the modalities.    Rehab Potential  Good    PT Frequency  2x /  week    PT Duration  6 weeks    PT Treatment/Interventions  ADLs/Self Care Home Management;Gait training;Stair training;Therapeutic activities;Therapeutic exercise;Electrical Stimulation;Cryotherapy;Moist Heat;Ultrasound;Iontophoresis 55m/ml Dexamethasone;Neuromuscular re-education;Patient/family education;Taping;Vasopneumatic Device;Passive range of motion;Manual techniques;Dry needling    PT Next Visit Plan  nustep, gentle pain free quad strengthening, core strengthening, STW/M to right low back,  modalities PRN for pain relief    Consulted and Agree with Plan of Care  Patient       Patient will benefit from skilled therapeutic intervention in order to improve the following deficits and impairments:  Pain, Postural dysfunction, Decreased strength, Decreased range of motion, Decreased endurance, Decreased activity tolerance, Decreased balance, Difficulty walking, Increased edema  Visit Diagnosis: Chronic pain of left knee  Chronic pain of right knee  Chronic right-sided low back pain, with sciatica presence unspecified  Difficulty in walking, not elsewhere classified     Problem List Patient Active Problem List   Diagnosis Date Noted  . Genetic testing 01/07/2017  . Family history of prostate cancer   . Family history of cancer   . 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 [place] 04/13/2016  . Closed fracture of left distal femur, initial encounter 02/13/2016  . GERD (gastroesophageal reflux disease) 06/30/2015  . Chronic lower back pain 06/18/2014  . Diabetes ([place] 02/21/2014  . Essential hypertension, benign 02/21/2014  . Constipation 02/21/2014    KStandley Brooking PTA 03/24/2018, 11:32 AM  [facility] [address]Phone: [phone]  Fax:  [phone] Name: Susan ROSIERMRN: [id number] of Birth: [birth year]

## 2018-03-27 ENCOUNTER — Encounter: Payer: Self-pay | Admitting: Physical Therapy

## 2018-03-27 ENCOUNTER — Ambulatory Visit: Payer: Medicare Other | Admitting: Physical Therapy

## 2018-03-27 DIAGNOSIS — R262 Difficulty in walking, not elsewhere classified: Secondary | ICD-10-CM

## 2018-03-27 DIAGNOSIS — G8929 Other chronic pain: Secondary | ICD-10-CM

## 2018-03-27 DIAGNOSIS — M25562 Pain in left knee: Secondary | ICD-10-CM | POA: Diagnosis not present

## 2018-03-27 DIAGNOSIS — M25561 Pain in right knee: Secondary | ICD-10-CM

## 2018-03-27 DIAGNOSIS — M545 Low back pain: Secondary | ICD-10-CM

## 2018-03-27 NOTE — Therapy (Signed)
[facility] [place], [place], [address] Phone: [phone]   Fax:  [phone]  Physical Therapy Treatment  Patient Details  Name: Susan Haney MRN: [id number] Date of Birth: 1932-02-04 Referring Provider: Octavio Graves, DO   Encounter Date: 03/27/2018  PT End of Session - 03/27/18 1029    Visit Number  5    Number of Visits  12    Date for PT Re-Evaluation  05/01/18    Authorization Type  progress note every 10th visit, kx modifier at 15th visit    PT Start Time  1032    PT Stop Time  1129    PT Time Calculation (min)  57 min    Activity Tolerance  Patient tolerated treatment well    Behavior During Therapy  [facility] for tasks assessed/performed       Past Medical History:  Diagnosis Date  . Anemia    history ,  took iron for years  . Arthritis   . Bicuspid aortic valve   . Cancer [facility])    endometrial cancer  . Cellulitis 12/31/2017   BLE, ankles  . Chronic back pain   . Diabetes ([place])    x 15 yrs with good control  . Endometrial cancer ([place])   . Family history of cancer   . Family history of prostate cancer   . GERD (gastroesophageal reflux disease)   . History of radiation therapy 06/15/16-07/06/16   vaginal cuff 30 Gy in 5 fractions  . Hypertension     Past Surgical History:  Procedure Laterality Date  . BUNIONECTOMY    . COLONOSCOPY N/A 04/04/2014   Procedure: COLONOSCOPY;  Surgeon: Rogene Houston, MD;  [facility];  Service: Endoscopy;  Laterality: N/A;  100  . EYE SURGERY     bil cataracts and lens implants  . FOOT SURGERY     Left  . Ovary removed: benign    . REPLACEMENT TOTAL KNEE BILATERAL     2013  . ROBOTIC ASSISTED TOTAL HYSTERECTOMY WITH BILATERAL SALPINGO OOPHERECTOMY Left 04/13/2016   Procedure: XI ROBOTIC ASSISTED TOTAL HYSTERECTOMY WITH LEFT SALPINGO OOPHORECTOMY SENTINEL LYMPH NODE BIOPSY;  Surgeon: Everitt Amber, MD;  Location: [facility];  Service: Gynecology;  Laterality:  Left;  . TOTAL KNEE REVISION Left 02/12/2016   Procedure: LEFT TOTAL KNEE REVISION;  Surgeon: Rod Can, MD;  Location: [facility];  Service: Orthopedics;  Laterality: Left;    There were no vitals filed for this visit.  Subjective Assessment - 03/27/18 1028    Subjective  Reports waking with increased pain today.    Limitations  Sitting;Walking;Standing;House hold activities    How long can you sit comfortably?  30 minutes    How long can you stand comfortably?  20 minutes    How long can you walk comfortably?  30 minutes     Diagnostic tests  MRI; X-Ray    Currently in Pain?  Yes    Pain Score  4     Pain Location  Knee    Pain Orientation  Left    Pain Descriptors / Indicators  Discomfort    Pain Type  Chronic pain    Pain Onset  More than a month ago         [facility] PT Assessment - 03/27/18 0001      Assessment   Medical Diagnosis  Post-traumatic arthritis of L knee, Lumbar DDD, OA of right knee    Next MD Visit  n/a  Prior Therapy  yes      Precautions   Precautions  None      Restrictions   Weight Bearing Restrictions  No      ROM / Strength   AROM / PROM / Strength  AROM      AROM   Overall AROM   Within functional limits for tasks performed    AROM Assessment Site  Knee    Right/Left Knee  Left    Left Knee Flexion  115                   [facility] Adult PT Treatment/Exercise - 03/27/18 0001      Exercises   Exercises  Knee/Hip;Lumbar      Knee/Hip Exercises: Aerobic   Nustep  L5 x13 min      Knee/Hip Exercises: Standing   Heel Raises  Both;20 reps    Hip Flexion  AROM;Both;20 reps;Knee bent    Hip Abduction  AROM;Both;20 reps;Knee straight    Forward Step Up  Left;Hand Hold: 2;Step Height: 6"   Limite with LLE due to pain     Knee/Hip Exercises: Supine   Short Arc Quad Sets  Left;Other (comment)   VMS to L quad/VMO     Modalities   Modalities  Electrical Stimulation;Moist Heat      Moist Heat Therapy   Number Minutes Moist Heat   15 Minutes    Moist Heat Location  Lumbar Spine      Electrical Stimulation   Electrical Stimulation Location  L VMO,Quad; Low back    Electrical Stimulation Action  VMS; Pre-Mod    Electrical Stimulation Parameters  10/10, 50 pps, 5 sec ramp, 300 usec x10 min; 80-150 hz x15 min    Electrical Stimulation Goals  Neuromuscular facilitation;Pain               PT Short Term Goals - 03/13/18 1952      PT SHORT TERM GOAL #1   Title  STG's=LTG's.        PT Long Term Goals - 03/27/18 1054      PT LONG TERM GOAL #1   Title  Patient will be independent with HEP    Time  6    Period  Weeks    Status  Achieved      PT LONG TERM GOAL #2   Title  Patient will demonstrate 115 degrees or greater of L knee flexion to improve ability to perform functional tasks.    Time  6    Period  Weeks    Status  Achieved      PT LONG TERM GOAL #3   Title  Patient will increase bilateral LE strength to 4/5 or greater to improve stability during functional tasks.    Time  6    Period  Weeks    Status  On-going      PT LONG TERM GOAL #4   Title  Patient will reports ability to perform ADLs with less than 4/10 low back pain.    Time  6    Period  Weeks    Status  On-going      PT LONG TERM GOAL #5   Title               Plan - 03/27/18 1106    Clinical Impression Statement  Patient tolerated today's treatment fairly well although still limited by deficient strength in LLE. Patient able to complete all standing exercises fairly well  with core activation but limited by forward step ups. Patient reports ascending her stairs at home with RLE only secondary to increased LLE weakness. Goal achieved for L knee flexion but only pain and MMT goals secondary to continued pain affecting ADLs as well as strength. VMS continued but continues to be painful with eccentrically lowering L lower leg from SAQ. Normal modalities response noted following removal of the modalities.    Rehab Potential  Good     PT Frequency  2x / week    PT Duration  6 weeks    PT Treatment/Interventions  ADLs/Self Care Home Management;Gait training;Stair training;Therapeutic activities;Therapeutic exercise;Electrical Stimulation;Cryotherapy;Moist Heat;Ultrasound;Iontophoresis 62m/ml Dexamethasone;Neuromuscular re-education;Patient/family education;Taping;Vasopneumatic Device;Passive range of motion;Manual techniques;Dry needling    PT Next Visit Plan  nustep, gentle pain free quad strengthening, core strengthening, STW/M to right low back,  modalities PRN for pain relief    Consulted and Agree with Plan of Care  Patient       Patient will benefit from skilled therapeutic intervention in order to improve the following deficits and impairments:  Pain, Postural dysfunction, Decreased strength, Decreased range of motion, Decreased endurance, Decreased activity tolerance, Decreased balance, Difficulty walking, Increased edema  Visit Diagnosis: Chronic pain of left knee  Chronic pain of right knee  Chronic right-sided low back pain, with sciatica presence unspecified  Difficulty in walking, not elsewhere classified     Problem List Patient Active Problem List   Diagnosis Date Noted  . Genetic testing 01/07/2017  . Family history of prostate cancer   . Family history of cancer   . 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 [place]omet[place] 04/13/2016  . Closed fracture of left distal femur, initial encounter 02/13/2016  . GERD (gastroesophageal reflux disease) 06/30/2015  . Chronic lower back pain 06/18/2014  . Diabetes ([place] 02/21/2014  . Essential hypertension, benign 02/21/2014  . Constipation 02/21/2014    KStandley Brooking PTA 03/27/2018, 11:43 AM  [facility]MM[address] [place] [address]Phone: [phone]  Fax:  [phone] Name: Susan SWENDSENMRN: [id number] of Birth: [birth year]

## 2018-03-30 ENCOUNTER — Ambulatory Visit: Payer: Medicare Other | Admitting: Physical Therapy

## 2018-03-30 ENCOUNTER — Encounter: Payer: Self-pay | Admitting: Physical Therapy

## 2018-03-30 DIAGNOSIS — R262 Difficulty in walking, not elsewhere classified: Secondary | ICD-10-CM

## 2018-03-30 DIAGNOSIS — G8929 Other chronic pain: Secondary | ICD-10-CM

## 2018-03-30 DIAGNOSIS — M545 Low back pain: Secondary | ICD-10-CM

## 2018-03-30 DIAGNOSIS — M25562 Pain in left knee: Secondary | ICD-10-CM | POA: Diagnosis not present

## 2018-03-30 DIAGNOSIS — M25561 Pain in right knee: Secondary | ICD-10-CM

## 2018-03-30 NOTE — Therapy (Addendum)
[facility] [place], [place], [address] Phone: [phone]   Fax:  [phone]  Physical Therapy Treatment/Discharge PHYSICAL THERAPY DISCHARGE SUMMARY  Visits from Start of Care: 6  Current functional level related to goals / functional outcomes: See below   Remaining deficits: Goals partially met   Education / Equipment: HEP Plan: Patient agrees to discharge.  Patient goals were partially met. Patient is being discharged due to not returning since the last visit.  ?????     placed on hold; did not return. Gabriela Eves, PT, DPT 08/15/18  Patient Details  Name: Susan Haney MRN: [id number] Date of Birth: [birth year] Referring Provider: Octavio Graves, DO   Encounter Date: 03/30/2018  PT End of Session - 03/30/18 1036    Visit Number  6    Number of Visits  12    Date for PT Re-Evaluation  05/01/18    Authorization Type  progress note every 10th visit, kx modifier at 15th visit    PT Start Time  1032   time gap due to modality set up and MMT   PT Stop Time  1131    PT Time Calculation (min)  59 min    Activity Tolerance  Patient tolerated treatment well    Behavior During Therapy  [facility] for tasks assessed/performed       Past Medical History:  Diagnosis Date  . Anemia    history ,  took iron for years  . Arthritis   . Bicuspid aortic valve   . Cancer [facility])    endometrial cancer  . Cellulitis 12/31/2017   BLE, ankles  . Chronic back pain   . Diabetes ([place])    x 15 yrs with good control  . Endometrial cancer ([place])   . Family history of cancer   . Family history of prostate cancer   . GERD (gastroesophageal reflux disease)   . History of radiation therapy 06/15/16-07/06/16   vaginal cuff 30 Gy in 5 fractions  . Hypertension     Past Surgical History:  Procedure Laterality Date  . BUNIONECTOMY    . COLONOSCOPY N/A 04/04/2014   Procedure: COLONOSCOPY;  Surgeon: Rogene Houston, MD;  [facility];  Service: Endoscopy;  Laterality: N/A;  100  . EYE SURGERY     bil cataracts and lens implants  . FOOT SURGERY     Left  . Ovary removed: benign    . REPLACEMENT TOTAL KNEE BILATERAL     2013  . ROBOTIC ASSISTED TOTAL HYSTERECTOMY WITH BILATERAL SALPINGO OOPHERECTOMY Left 04/13/2016   Procedure: XI ROBOTIC ASSISTED TOTAL HYSTERECTOMY WITH LEFT SALPINGO OOPHORECTOMY SENTINEL LYMPH NODE BIOPSY;  Surgeon: Everitt Amber, MD;  [facility];  Service: Gynecology;  Laterality: Left;  . TOTAL KNEE REVISION Left 02/12/2016   Procedure: LEFT TOTAL KNEE REVISION;  Surgeon: Rod Can, MD;  [facility];  Service: Orthopedics;  Laterality: Left;    There were no vitals filed for this visit.  Subjective Assessment - 03/30/18 1034    Subjective  Reports that her knee is better and is having less pain but her back seems like it is worse.    Limitations  Sitting;Walking;Standing;House hold activities    How long can you sit comfortably?  30 minutes    How long can you stand comfortably?  20 minutes    How long can you walk comfortably?  30 minutes     Diagnostic tests  MRI; X-Ray  Currently in Pain?  Yes    Pain Score  6     Pain Location  Back    Pain Orientation  Lower    Pain Descriptors / Indicators  Discomfort    Pain Type  Chronic pain    Pain Radiating Towards  Buttocks    Pain Onset  More than a month ago    Pain Frequency  Constant    Pain Score  4    Pain Location  Knee    Pain Orientation  Left    Pain Descriptors / Indicators  Discomfort    Pain Type  Chronic pain    Pain Onset  More than a month ago    Aggravating Factors   Knee flexion during sitting and nustep         [facility] PT Assessment - 03/30/18 0001      Assessment   Medical Diagnosis  Post-traumatic arthritis of L knee, Lumbar DDD, OA of right knee    Next MD Visit  n/a    Prior Therapy  yes      Precautions   Precautions  None      Restrictions   Weight Bearing Restrictions  No       ROM / Strength   AROM / PROM / Strength  Strength      Strength   Overall Strength  Deficits    Strength Assessment Site  Hip;Knee    Right/Left Hip  Right;Left    Right Hip Flexion  4/5    Right Hip ABduction  4+/5    Left Hip Flexion  4/5    Left Hip ABduction  4+/5    Right/Left Knee  Right;Left    Right Knee Flexion  4+/5    Right Knee Extension  5/5    Left Knee Flexion  5/5    Left Knee Extension  4/5                   [facility] Adult PT Treatment/Exercise - 03/30/18 0001      Exercises   Exercises  Knee/Hip;Lumbar      Lumbar Exercises: Supine   Bridge  15 reps      Knee/Hip Exercises: Aerobic   Nustep  L5 x15 min      Knee/Hip Exercises: Standing   Heel Raises  Both;15 reps    Hip Flexion  AROM;Both;20 reps;Knee bent    Hip Abduction  AROM;Both;20 reps;Knee straight      Knee/Hip Exercises: Supine   Short Arc Quad Sets  Left;Other (comment)    Short Arc Quad Sets Limitations  VMS to L quad, VMO to improve neuro re-ed of L knee      Modalities   Modalities  Electrical Stimulation;Moist Heat      Moist Heat Therapy   Number Minutes Moist Heat  15 Minutes    Moist Heat Location  Lumbar Spine      Electrical Stimulation   Electrical Stimulation Location  L VMO,Quad; Low back    Electrical Stimulation Action  VMS; Pre-Mod    Electrical Stimulation Parameters  80-150 hz x15 min    Electrical Stimulation Goals  Neuromuscular facilitation;Pain               PT Short Term Goals - 03/13/18 1952      PT SHORT TERM GOAL #1   Title  STG's=LTG's.        PT Long Term Goals - 03/30/18 1101      PT  LONG TERM GOAL #1   Title  Patient will be independent with HEP    Time  6    Period  Weeks    Status  Achieved      PT LONG TERM GOAL #2   Title  Patient will demonstrate 115 degrees or greater of L knee flexion to improve ability to perform functional tasks.    Time  6    Period  Weeks    Status  Achieved      PT LONG TERM GOAL #3   Title   Patient will increase bilateral LE strength to 4/5 or greater to improve stability during functional tasks.    Time  6    Period  Weeks    Status  Achieved      PT LONG TERM GOAL #4   Title  Patient will reports ability to perform ADLs with less than 4/10 low back pain.    Time  6    Period  Weeks    Status  Partially Met   Able to do short period of ADLs then requires quick rest break 03/30/2018     PT LONG TERM GOAL #5   Title               Plan - 03/30/18 1103    Clinical Impression Statement  Patient presented in clinic with reports of reduced L knee pain. Patient able to report reduced frequency and intensity of L knee pain. Posture focus also added into standing exercises to reduce any aggravating factors in low back. Patient reporting a new symptom of clicking just inferior to L patella during eccentric lowering from SAQ. Patient able to achieve all goals although pain goal unmet as patient requires rest breaks.    Rehab Potential  Good    PT Frequency  2x / week    PT Duration  6 weeks    PT Treatment/Interventions  ADLs/Self Care Home Management;Gait training;Stair training;Therapeutic activities;Therapeutic exercise;Electrical Stimulation;Cryotherapy;Moist Heat;Ultrasound;Iontophoresis 78m/ml Dexamethasone;Neuromuscular re-education;Patient/family education;Taping;Vasopneumatic Device;Passive range of motion;Manual techniques;Dry needling    PT Next Visit Plan  Place on hold.    Consulted and Agree with Plan of Care  Patient       Patient will benefit from skilled therapeutic intervention in order to improve the following deficits and impairments:  Pain, Postural dysfunction, Decreased strength, Decreased range of motion, Decreased endurance, Decreased activity tolerance, Decreased balance, Difficulty walking, Increased edema  Visit Diagnosis: Chronic pain of left knee  Chronic pain of right knee  Chronic right-sided low back pain, with sciatica presence  unspecified  Difficulty in walking, not elsewhere classified     Problem List Patient Active Problem List   Diagnosis Date Noted  . Genetic testing 01/07/2017  . Family history of prostate cancer   . Family history of cancer   . 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 [place]omet[place] 04/13/2016  . Closed fracture of left distal femur, initial encounter 02/13/2016  . GERD (gastroesophageal reflux disease) 06/30/2015  . Chronic lower back pain 06/18/2014  . Diabetes [place] 02/21[place]2014  . Essential hypertension, benign 02/21/2014  . Constipation 02/21/2014    KStandley Brooking PTA 03/30/2018, 12:21 PM  [facility] [address] [address]Phone: [phone]  Fax:  [phone] Name: Susan EFAWMRN: [id number] of Birth: 3April 15, 1933

## 2018-04-11 ENCOUNTER — Other Ambulatory Visit: Payer: Self-pay | Admitting: Orthopedic Surgery

## 2018-04-17 ENCOUNTER — Encounter: Payer: Self-pay | Admitting: Gynecologic Oncology

## 2018-04-17 ENCOUNTER — Inpatient Hospital Stay: Payer: Medicare Other | Attending: Gynecologic Oncology | Admitting: Gynecologic Oncology

## 2018-04-17 VITALS — BP 151/66 | HR 74 | Temp 98.4°F | Resp 18 | Ht 65.0 in | Wt 199.0 lb

## 2018-04-17 DIAGNOSIS — Z90722 Acquired absence of ovaries, bilateral: Secondary | ICD-10-CM | POA: Diagnosis not present

## 2018-04-17 DIAGNOSIS — N816 Rectocele: Secondary | ICD-10-CM | POA: Insufficient documentation

## 2018-04-17 DIAGNOSIS — Z90710 Acquired absence of both cervix and uterus: Secondary | ICD-10-CM

## 2018-04-17 DIAGNOSIS — R21 Rash and other nonspecific skin eruption: Secondary | ICD-10-CM | POA: Diagnosis not present

## 2018-04-17 DIAGNOSIS — C541 Malignant neoplasm of endometrium: Secondary | ICD-10-CM | POA: Diagnosis present

## 2018-04-17 DIAGNOSIS — Z923 Personal history of irradiation: Secondary | ICD-10-CM | POA: Insufficient documentation

## 2018-04-17 MED ORDER — DESONIDE 0.05 % EX OINT: 1.0000 "application " | TOPICAL_OINTMENT | Freq: Two times a day (BID) | CUTANEOUS | 0 refills | Status: DC

## 2018-04-17 NOTE — Patient Instructions (Signed)
Please return to see Dr Sondra Come as scheduled in December. After that appointment, have his office schedule an appointment with Dr Denman George for June 2020.  Dr Denman George has prescribed desonide steroid to use on the area of the breast (twice a day until resolved).   Please notify Dr Denman George at phone number [phone] if you notice vaginal bleeding, new pelvic or abdominal pains, bloating, feeling full easy, or a change in bladder or bowel function.

## 2018-04-17 NOTE — Progress Notes (Signed)
Follow-up Note: Endometrial cancer   Susan Haney 84 y.o. female  Chief Complaint  Patient presents with  . Endometrial cancer [facility])  history of endometrial cancer  Assessment : 84 year old woman with stage IB grade 2 endometrioid endometrial adenocarcinoma with high/intermediate risk factors and Microsatellite instability on routine tumor testing, Lynch syndrome negative. s/p vaginal brachytherapy (completed December, 2017). No evidence for recurrence. Rash on breast   Plan:  I recommend she follow-up at 3 monthly intervals for symptom review, physical examination and pelvic examination. Pap smear is not recommended in routine endometrial cancer surveillance. After 2 years we will space these visits to every 6 months, and then annually if recurrence has not developed within 5 years. Next appointment is with Dr Sondra Come in December, I will plan on seeing her in June, 2020.  Rectocele - counseled regarding vaginal splinting with BM's  Breast rash - prescribed desonide ointment.   HPI: The patient had onset of postmenopausal bleeding approximately a month ago. This was not  associated with any pain or other symptoms. An endometrial biopsy was obtained showing a grade 1-2 endometrial adenocarcinoma. Pelvic ultrasound showed the uterus to measure 7 x 4.4 x 5.6 cm with 2 small fibroids. The left ovary surgically absent right ovary appeared normal there is no free fluid.  Today patient is having some vaginal bleeding. She also notes some increased edema and pain in her left lower extremity below the knee.  The patient has a past history of a benign left ovarian cyst which was removed laparoscopically. The patient underwent menopause at age 75. Pap smears are normal  Obstetrical history gravida 2 para 2  Comorbidities include diabetes valvular heart disease osteoporosis lumbar disc disease and obesity.  Her original surgery date had to be postponed when she acutely fell and fractured her  left distal femur assoicated with a knee replacement on 02/12/16. She required emergent repair and replacement and had been in rehab since that time.   On 04/13/16 we performed robotic assisted total hysterectomy, BSO, bilateral SLN biopsy. Final pathology revealed a 4.3cm FIGO grade 2 tumor with 10 of 50m myometrial invasion, negative LVSI and cervical stromal involvement. Bilateral SLN's were negative. 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 Genetic testing negative.  She went on to receive vaginal brachytherapy in accordance with NCCN guidelines: 06/15/16-07/06/16 30 Gy in 5 fractions to the vaginal cuff.  Interval Hx: She has no vaginal bleeding or symptoms concerning for recurrence. Though she does have difficulty evacuating stool. She has been seen by a GI for this and is on a GI regimen. Her symptoms are consistent with rectocele- difficulty emptying.   Review of Systems:10 point review of systems is negative except as noted in interval history. She does have new breast rash (similar to what she had on abdomen which resolved with triamcinolone ointment which she has not used on breast due to caution about its use on breast/face).   Vitals: Blood pressure (!) 151/66, pulse 74, temperature 98.4 F (36.9 C), temperature source Oral, resp. rate 18, height '5\' 5"'  (1.651 m), weight 199 lb (90.3 kg), SpO2 97 %.  Physical Exam: General : The patient is a healthy woman in no acute distress.  HEENT: normocephalic, extraoccular movements normal; neck is supple without thyromegally  Lynphnodes: Supraclavicular and inguinal nodes not enlarged  Abdomen: Soft, non-tender, no ascites, no organomegally, no masses, no hernias. Incisions well healed. Pelvic:  EGBUS: Normal female (atrophic) Vagina: Normal, atrophic, no lesions. Cuff  healing normally and in tact with no blood. Urethra and Bladder: Normal, non-tender  Cervix & uterus surgically absent. Bi-manual examination: cuff  in tact. No lesions. + vaginal prolapse/relaxation consistent with rectocele Rectal: normal sphincter tone, no masses, no blood  Lower extremities: incision healing normally on left leg/knee with no signs of infection. Can flex to 110 degrees. Breast: circular 3cm rash on left outer breast. No associated mass or drainage. Normal nipple.       No Known Allergies  Past Medical History:  Diagnosis Date  . Anemia    history ,  took iron for years  . Arthritis   . Bicuspid aortic valve   . Cancer [facility])    endometrial cancer  . Cellulitis 12/31/2017   BLE, ankles  . Chronic back pain   . Diabetes mellitus without complication ([place])   . Endometrial cancer ([place])   . Family history of cancer   . Family history of prostate cancer   . GERD (gastroesophageal reflux disease)   . History of radiation therapy 06/15/16-07/06/16   vaginal cuff 30 Gy in 5 fractions  . Hypertension     Past Surgical History:  Procedure Laterality Date  . BUNIONECTOMY    . COLONOSCOPY N/A 04/04/2014   Procedure: COLONOSCOPY;  Surgeon: Rogene Houston, MD;  [facility];  Service: Endoscopy;  Laterality: N/A;  100  . EYE SURGERY     bil cataracts and lens implants  . FOOT SURGERY     Left  . Ovary removed: benign    . REPLACEMENT TOTAL KNEE BILATERAL     2013  . ROBOTIC ASSISTED TOTAL HYSTERECTOMY WITH BILATERAL SALPINGO OOPHERECTOMY Left 04/13/2016   Procedure: XI ROBOTIC ASSISTED TOTAL HYSTERECTOMY WITH LEFT SALPINGO OOPHORECTOMY SENTINEL LYMPH NODE BIOPSY;  Surgeon: Everitt Amber, MD;  Location: [facility] OR[facility];  Service: Gynecology;  Laterality: Left;  . TOTAL KNEE REVISION Left 02/12/2016   Procedure: LEFT TOTAL KNEE REVISION;  Surgeon: Rod Can, MD;  Location: [facility];  Service: Orthopedics;  Laterality: Left;    Current Outpatient Medications  Medication Sig Dispense Refill  . aspirin EC 81 MG tablet Take 81 mg by mouth daily.    . cholecalciferol (VITAMIN D) 400 UNITS TABS tablet Take 2,000  Units by mouth.    . docusate sodium (COLACE) 100 MG capsule Take 100 mg by mouth 2 (two) times daily.    . hydrochlorothiazide (HYDRODIURIL) 25 MG tablet TAKE 1 TABLET BY MOUTH EVERY DAY IN THE MORNING  4  . lisinopril-hydrochlorothiazide (PRINZIDE,ZESTORETIC) 10-12.5 MG tablet Take 1 tablet by mouth daily.    Marland Kitchen lubiprostone (AMITIZA) 24 MCG capsule Take 1 capsule (24 mcg total) by mouth daily with breakfast. 60 capsule 4  . metFORMIN (GLUMETZA) 500 MG (MOD) 24 hr tablet Take 500 mg by mouth every evening.    . Multiple Vitamins-Minerals (CENTRUM SILVER ADULT 50+) TABS Take 1 tablet by mouth daily.     . Omega-3 Fatty Acids (OMEGA 3 500 PO) Take 4 tablets by mouth.     Marland Kitchen omeprazole (PRILOSEC) 20 MG capsule TAKE 1 CAPSULE (20 MG TOTAL) BY MOUTH DAILY. 90 capsule 3  . sitaGLIPtin (JANUVIA) 100 MG tablet Januvia 100 mg tablet    . triamcinolone cream (KENALOG) 0.1 % APPLY TO AFFECTED AREA ONE TO TWO TIMES DAILY (NOT FOR FACE/SKINFOLDS)  6  . naproxen (NAPROSYN) 500 MG tablet Take 500 mg by mouth 2 (two) times daily with a meal.     No current facility-administered medications for  this visit.     Social History   Socioeconomic History  . Marital status: Widowed    Spouse name: Not on file  . Number of children: 2  . Years of education: Not on file  . Highest education level: Not on file  Occupational History  . Not on file  Social Needs  . Financial resource strain: Not on file  . Food insecurity:    Worry: Not on file    Inability: Not on file  . Transportation needs:    Medical: Not on file    Non-medical: Not on file  Tobacco Use  . Smoking status: Never Smoker  . Smokeless tobacco: Never Used  Substance and Sexual Activity  . Alcohol use: No    Alcohol/week: 0.0 standard drinks  . Drug use: No  . Sexual activity: Not Currently  Lifestyle  . Physical activity:    Days per week: Not on file    Minutes per session: Not on file  . Stress: Not on file  Relationships  .  Social connections:    Talks on phone: Not on file    Gets together: Not on file    Attends religious service: Not on file    Active member of club or organization: Not on file    Attends meetings of clubs or organizations: Not on file    Relationship status: Not on file  . Intimate partner violence:    Fear of current or ex partner: Not on file    Emotionally abused: Not on file    Physically abused: Not on file    Forced sexual activity: Not on file  Other Topics Concern  . Not on file  Social History Narrative  . Not on file    Family History  Problem Relation Age of Onset  . Prostate cancer Father 51  . Diabetes Maternal Grandmother   . Heart attack Maternal Grandfather   . Cancer Sister        unknown primary  . Cancer Other 42       possibly pancreatic  . Cancer Other        possibly uterine or ovarian    Thereasa Solo, MD 04/17/2018, 2:00 PM  CC: Dr Octavio Graves

## 2018-05-22 ENCOUNTER — Encounter (HOSPITAL_BASED_OUTPATIENT_CLINIC_OR_DEPARTMENT_OTHER): Payer: Self-pay | Admitting: *Deleted

## 2018-05-22 ENCOUNTER — Other Ambulatory Visit: Payer: Self-pay

## 2018-05-23 ENCOUNTER — Encounter (HOSPITAL_BASED_OUTPATIENT_CLINIC_OR_DEPARTMENT_OTHER)
Admission: RE | Admit: 2018-05-23 | Discharge: 2018-05-23 | Disposition: A | Discharge status: Home / Self Care | Payer: Medicare Other | Source: Ambulatory Visit | Attending: Orthopedic Surgery | Admitting: Orthopedic Surgery

## 2018-05-23 DIAGNOSIS — E119 Type 2 diabetes mellitus without complications: Secondary | ICD-10-CM | POA: Insufficient documentation

## 2018-05-23 DIAGNOSIS — R001 Bradycardia, unspecified: Secondary | ICD-10-CM | POA: Diagnosis not present

## 2018-05-23 DIAGNOSIS — R9431 Abnormal electrocardiogram [ECG] [EKG]: Secondary | ICD-10-CM | POA: Diagnosis not present

## 2018-05-23 DIAGNOSIS — I10 Essential (primary) hypertension: Secondary | ICD-10-CM | POA: Insufficient documentation

## 2018-05-23 DIAGNOSIS — Z01818 Encounter for other preprocedural examination: Secondary | ICD-10-CM | POA: Diagnosis present

## 2018-05-23 LAB — BASIC METABOLIC PANEL
Anion gap: 9 (ref 5–15)
BUN: 21 mg/dL (ref 8–23)
CALCIUM: 9.3 mg/dL (ref 8.9–10.3)
CO2: 26 mmol/L (ref 22–32)
Chloride: 106 mmol/L (ref 98–111)
Creatinine, Ser: 1.19 mg/dL — ABNORMAL HIGH (ref 0.44–1.00)
GFR calc Af Amer: 47 mL/min — ABNORMAL LOW (ref >60)
GFR calc non Af Amer: 41 mL/min — ABNORMAL LOW (ref >60)
GLUCOSE: 91 mg/dL (ref 70–99)
Potassium: 3.8 mmol/L (ref 3.5–5.1)
Sodium: 141 mmol/L (ref 135–145)

## 2018-05-29 NOTE — Anesthesia Preprocedure Evaluation (Signed)
Anesthesia Evaluation  Patient identified by MRN, date of birth, ID band Patient awake    Reviewed: Allergy & Precautions, NPO status , Patient's Chart, lab work & pertinent test results  History of Anesthesia Complications Negative for: history of anesthetic complications  Airway Mallampati: II  TM Distance: >3 FB     Dental no notable dental hx. (+) Dental Advisory Given   Pulmonary neg pulmonary ROS,    Pulmonary exam normal        Cardiovascular hypertension, Normal cardiovascular exam  Bicuspid aortic valve   Neuro/Psych  Headaches, negative psych ROS   GI/Hepatic Neg liver ROS, GERD  ,  Endo/Other  diabetes  Renal/GU negative Renal ROS     Musculoskeletal  (+) Arthritis ,   Abdominal   Peds  Hematology  (+) anemia ,   Anesthesia Other Findings   Reproductive/Obstetrics                             Anesthesia Physical  Anesthesia Plan  ASA: III  Anesthesia Plan: Bier Block and Bier Block-LIDOCAINE ONLY   Post-op Pain Management:    Induction: Intravenous  PONV Risk Score and Plan:   Airway Management Planned: Simple Face Mask  Additional Equipment: None  Intra-op Plan:   Post-operative Plan:   Informed Consent: I have reviewed the patients History and Physical, chart, labs and discussed the procedure including the risks, benefits and alternatives for the proposed anesthesia with the patient or authorized representative who has indicated his/her understanding and acceptance.   Dental advisory given  Plan Discussed with: CRNA  Anesthesia Plan Comments:         Anesthesia Quick Evaluation

## 2018-05-30 ENCOUNTER — Ambulatory Visit (HOSPITAL_BASED_OUTPATIENT_CLINIC_OR_DEPARTMENT_OTHER): Payer: Medicare Other | Admitting: Anesthesiology

## 2018-05-30 ENCOUNTER — Encounter (HOSPITAL_BASED_OUTPATIENT_CLINIC_OR_DEPARTMENT_OTHER): Payer: Self-pay | Admitting: Anesthesiology

## 2018-05-30 ENCOUNTER — Encounter (HOSPITAL_BASED_OUTPATIENT_CLINIC_OR_DEPARTMENT_OTHER)
Admission: RE | Disposition: A | Discharge status: Home / Self Care | Payer: Self-pay | Source: Ambulatory Visit | Attending: Orthopedic Surgery

## 2018-05-30 ENCOUNTER — Ambulatory Visit (HOSPITAL_BASED_OUTPATIENT_CLINIC_OR_DEPARTMENT_OTHER)
Admission: RE | Admit: 2018-05-30 | Discharge: 2018-05-30 | Disposition: A | Discharge status: Home / Self Care | Payer: Medicare Other | Source: Ambulatory Visit | Attending: Orthopedic Surgery | Admitting: Orthopedic Surgery

## 2018-05-30 ENCOUNTER — Other Ambulatory Visit: Payer: Self-pay

## 2018-05-30 DIAGNOSIS — M19042 Primary osteoarthritis, left hand: Secondary | ICD-10-CM | POA: Insufficient documentation

## 2018-05-30 DIAGNOSIS — I10 Essential (primary) hypertension: Secondary | ICD-10-CM | POA: Diagnosis not present

## 2018-05-30 DIAGNOSIS — Z8542 Personal history of malignant neoplasm of other parts of uterus: Secondary | ICD-10-CM | POA: Insufficient documentation

## 2018-05-30 DIAGNOSIS — G5603 Carpal tunnel syndrome, bilateral upper limbs: Secondary | ICD-10-CM | POA: Diagnosis present

## 2018-05-30 DIAGNOSIS — K219 Gastro-esophageal reflux disease without esophagitis: Secondary | ICD-10-CM | POA: Diagnosis not present

## 2018-05-30 DIAGNOSIS — M19041 Primary osteoarthritis, right hand: Secondary | ICD-10-CM | POA: Insufficient documentation

## 2018-05-30 DIAGNOSIS — E119 Type 2 diabetes mellitus without complications: Secondary | ICD-10-CM | POA: Insufficient documentation

## 2018-05-30 DIAGNOSIS — Z79899 Other long term (current) drug therapy: Secondary | ICD-10-CM | POA: Diagnosis not present

## 2018-05-30 DIAGNOSIS — M4722 Other spondylosis with radiculopathy, cervical region: Secondary | ICD-10-CM | POA: Insufficient documentation

## 2018-05-30 DIAGNOSIS — M109 Gout, unspecified: Secondary | ICD-10-CM | POA: Insufficient documentation

## 2018-05-30 HISTORY — PX: CARPAL TUNNEL RELEASE: SHX101

## 2018-05-30 LAB — GLUCOSE, CAPILLARY
GLUCOSE-CAPILLARY: 110 mg/dL — AB (ref 70–99)
Glucose-Capillary: 125 mg/dL — ABNORMAL HIGH (ref 70–99)

## 2018-05-30 SURGERY — CARPAL TUNNEL RELEASE
Anesthesia: Regional | Site: Wrist | Laterality: Right

## 2018-05-30 MED ORDER — FENTANYL CITRATE (PF) 100 MCG/2ML IJ SOLN
INTRAMUSCULAR | Status: AC
  Filled 2018-05-30: qty 2

## 2018-05-30 MED ORDER — FENTANYL CITRATE (PF) 100 MCG/2ML IJ SOLN: 25.0000 ug | INTRAMUSCULAR | Status: DC | PRN

## 2018-05-30 MED ORDER — LACTATED RINGERS IV SOLN
INTRAVENOUS | Status: DC
  Administered 2018-05-30: 08:00:00 via INTRAVENOUS

## 2018-05-30 MED ORDER — BUPIVACAINE HCL (PF) 0.25 % IJ SOLN
INTRAMUSCULAR | Status: AC
  Filled 2018-05-30: qty 30

## 2018-05-30 MED ORDER — LIDOCAINE HCL (PF) 0.5 % IJ SOLN
INTRAMUSCULAR | Status: DC | PRN
  Administered 2018-05-30: 30 mL via INTRAVENOUS

## 2018-05-30 MED ORDER — TRAMADOL HCL 50 MG PO TABS: 50.0000 mg | ORAL_TABLET | Freq: Four times a day (QID) | ORAL | 0 refills | Status: DC | PRN

## 2018-05-30 MED ORDER — CHLORHEXIDINE GLUCONATE 4 % EX LIQD: 60.0000 mL | Freq: Once | CUTANEOUS | Status: DC

## 2018-05-30 MED ORDER — PROPOFOL 500 MG/50ML IV EMUL
INTRAVENOUS | Status: AC
  Filled 2018-05-30: qty 50

## 2018-05-30 MED ORDER — CEFAZOLIN SODIUM-DEXTROSE 2-4 GM/100ML-% IV SOLN
INTRAVENOUS | Status: AC
  Filled 2018-05-30: qty 100

## 2018-05-30 MED ORDER — BUPIVACAINE HCL (PF) 0.25 % IJ SOLN
INTRAMUSCULAR | Status: DC | PRN
  Administered 2018-05-30: 5 mL

## 2018-05-30 MED ORDER — CEFAZOLIN SODIUM-DEXTROSE 2-4 GM/100ML-% IV SOLN: 2.0000 g | INTRAVENOUS | Status: DC

## 2018-05-30 MED ORDER — PROPOFOL 500 MG/50ML IV EMUL
INTRAVENOUS | Status: DC | PRN
  Administered 2018-05-30: 50 ug/kg/min via INTRAVENOUS

## 2018-05-30 MED ORDER — OXYCODONE HCL 5 MG PO TABS
5.0000 mg | ORAL_TABLET | Freq: Once | ORAL | Status: DC | PRN
Start: 2018-05-30 — End: 2018-05-30

## 2018-05-30 MED ORDER — MEPERIDINE HCL 25 MG/ML IJ SOLN: 6.2500 mg | INTRAMUSCULAR | Status: DC | PRN

## 2018-05-30 MED ORDER — ONDANSETRON HCL 4 MG/2ML IJ SOLN
INTRAMUSCULAR | Status: DC | PRN
  Administered 2018-05-30: 4 mg via INTRAVENOUS

## 2018-05-30 MED ORDER — OXYCODONE HCL 5 MG/5ML PO SOLN: 5.0000 mg | Freq: Once | ORAL | Status: DC | PRN

## 2018-05-30 MED ORDER — SCOPOLAMINE 1 MG/3DAYS TD PT72: 1.0000 | MEDICATED_PATCH | Freq: Once | TRANSDERMAL | Status: DC | PRN

## 2018-05-30 MED ORDER — FENTANYL CITRATE (PF) 100 MCG/2ML IJ SOLN
50.0000 ug | INTRAMUSCULAR | Status: DC | PRN
  Administered 2018-05-30 (×2): 50 ug via INTRAVENOUS

## 2018-05-30 MED ORDER — PROPOFOL 10 MG/ML IV BOLUS
INTRAVENOUS | Status: DC | PRN
  Administered 2018-05-30: 10 mg via INTRAVENOUS

## 2018-05-30 MED ORDER — MIDAZOLAM HCL 2 MG/2ML IJ SOLN: 1.0000 mg | INTRAMUSCULAR | Status: DC | PRN

## 2018-05-30 SURGICAL SUPPLY — 37 items
BLADE SURG 15 STRL LF DISP TIS (BLADE) ×1 IMPLANT
BLADE SURG 15 STRL SS (BLADE) ×3
BNDG CMPR 9X4 STRL LF SNTH (GAUZE/BANDAGES/DRESSINGS)
BNDG COHESIVE 3X5 TAN STRL LF (GAUZE/BANDAGES/DRESSINGS) ×3 IMPLANT
BNDG ESMARK 4X9 LF (GAUZE/BANDAGES/DRESSINGS) IMPLANT
BNDG GAUZE ELAST 4 BULKY (GAUZE/BANDAGES/DRESSINGS) ×3 IMPLANT
CHLORAPREP W/TINT 26ML (MISCELLANEOUS) ×3 IMPLANT
CORD BIPOLAR FORCEPS 12FT (ELECTRODE) ×3 IMPLANT
COVER BACK TABLE 60X90IN (DRAPES) ×3 IMPLANT
COVER MAYO STAND STRL (DRAPES) ×3 IMPLANT
COVER WAND RF STERILE (DRAPES) IMPLANT
CUFF TOURNIQUET SINGLE 18IN (TOURNIQUET CUFF) ×3 IMPLANT
DRAPE EXTREMITY T 121X128X90 (DRAPE) ×3 IMPLANT
DRAPE SURG 17X23 STRL (DRAPES) ×3 IMPLANT
DRSG PAD ABDOMINAL 8X10 ST (GAUZE/BANDAGES/DRESSINGS) ×3 IMPLANT
GAUZE SPONGE 4X4 12PLY STRL (GAUZE/BANDAGES/DRESSINGS) ×3 IMPLANT
GAUZE XEROFORM 1X8 LF (GAUZE/BANDAGES/DRESSINGS) ×3 IMPLANT
GLOVE BIOGEL PI IND STRL 7.0 (GLOVE) IMPLANT
GLOVE BIOGEL PI IND STRL 8.5 (GLOVE) ×1 IMPLANT
GLOVE BIOGEL PI INDICATOR 7.0 (GLOVE) ×4
GLOVE BIOGEL PI INDICATOR 8.5 (GLOVE) ×2
GLOVE ECLIPSE 6.5 STRL STRAW (GLOVE) ×2 IMPLANT
GLOVE SURG ORTHO 8.0 STRL STRW (GLOVE) ×3 IMPLANT
GOWN STRL REUS W/ TWL LRG LVL3 (GOWN DISPOSABLE) ×1 IMPLANT
GOWN STRL REUS W/TWL LRG LVL3 (GOWN DISPOSABLE) ×3
GOWN STRL REUS W/TWL XL LVL3 (GOWN DISPOSABLE) ×3 IMPLANT
NDL PRECISIONGLIDE 27X1.5 (NEEDLE) IMPLANT
NEEDLE PRECISIONGLIDE 27X1.5 (NEEDLE) ×3 IMPLANT
NS IRRIG 1000ML POUR BTL (IV SOLUTION) ×3 IMPLANT
PACK BASIN DAY SURGERY FS (CUSTOM PROCEDURE TRAY) ×3 IMPLANT
STOCKINETTE 4X48 STRL (DRAPES) ×3 IMPLANT
SUT ETHILON 4 0 PS 2 18 (SUTURE) ×3 IMPLANT
SUT VICRYL 4-0 PS2 18IN ABS (SUTURE) IMPLANT
SYR BULB 3OZ (MISCELLANEOUS) ×3 IMPLANT
SYR CONTROL 10ML LL (SYRINGE) ×2 IMPLANT
TOWEL GREEN STERILE FF (TOWEL DISPOSABLE) ×3 IMPLANT
UNDERPAD 30X30 (UNDERPADS AND DIAPERS) ×1 IMPLANT

## 2018-05-30 NOTE — Op Note (Signed)
NAME: Susan Haney RECORD NO: [id number] DATE OF BIRTH: 18-Mar-1932 FAC[facility] LOCATION: [facility] PHYSICIAN: Wynonia Sours, MD   OPERATIVE REPORT   DATE OF PROCEDURE: 05/30/18    PREOPERATIVE DIAGNOSIS:   Carpal tunnel syndrome right hand   POSTOPERATIVE DIAGNOSIS:   Same   PROCEDURE:   Decompression median nerve right hand   SURGEON: Daryll Brod, M.D.   ASSISTANT: none   ANESTHESIA:  Bier block with sedation and Local   INTRAVENOUS FLUIDS:  Per anesthesia flow sheet.   ESTIMATED BLOOD LOSS:  Minimal.   COMPLICATIONS:  None.   SPECIMENS:  none   TOURNIQUET TIME:    Total Tourniquet Time Documented: Forearm (Right) - 18 minutes Total: Forearm (Right) - 18 minutes    DISPOSITION:  Stable to PACU.   INDICATIONS: [name] with a history of numbness and tingling to her right hand.  Nerve conductions are positive for carpal tunnel syndrome.  She also has cervical spondylosis.  She has seen Dr. Patience Musca for examination and treatment of this.  She is elected to undergo surgical treatment with following failure conservative treatment.  Pre-peri-and postoperative course been discussed along with risk complications.  She is aware that there is no guarantee to the surgery the possibility of infection recurrence injury to arteries nerves tendons complete relief of symptoms and dystrophy.  The operative area the patient is seen extremity marked by both patient and surgeon antibiotic given  OPERATIVE COURSE: She is brought to the operating room where a forearm-based IV regional anesthetic was carried out without difficulty.  She was prepped using ChloraPrep in the supine position with the right arm free.  A three-minute dry time was allowed and a timeout taken to confirm patient procedure.  A longitudinal incision was made in the right palm.  This was carried down through subcutaneous tissue.  Bleeders were electrocauterized with  bipolar.  The palmar fascia was split revealing the superficial palmar arch.  The flexor tendon to the ring little finger were identified.  Retractors were placed retracting the tendons and median nerve radially and ulnar nerve ulnarly.  The flexor retinaculum was then released on its ulnar border.  A right angle and stool retractor placed between skin and forearm fascia.  The deep structures were dissected free with blunt dissection.  Blunt scissors were then used to release the proximal aspect of the flexor retinaculum distal forearm fascia for approximately 2 cm proximal to the wrist crease under direct vision.  The canal was explored.  Area compression to the nerve was apparent.  The motor branch entered in the muscle distally.  The wound was copiously irrigated with saline.  The skin was closed interrupted 4-0 nylon sutures.  Local infiltration quarter percent bupivacaine without epinephrine was given to the wound.  Proximally 7 to the cc was used.  A sterile compressive dressing with the fingers 3 was applied.  Deflation of the tourniquet all fingers immediately pink.  She was taken to the recovery room for observation in satisfactory condition.  She will be discharged home to return [facility] in 1 week on Ultram.  She will try Tylenol ibuprofen first but will have the Ultram for breakthrough.   Daryll Brod, MD Electronically signed, 05/30/18

## 2018-05-30 NOTE — Transfer of Care (Signed)
Immediate Anesthesia Transfer of Care Note  Patient: Susan Haney  Procedure(s) Performed: RIGHT CARPAL TUNNEL RELEASE (Right Wrist)  Patient Location: PACU  Anesthesia Type:MAC and Bier block  Level of Consciousness: awake, alert  and oriented  Airway & Oxygen Therapy: Patient Spontanous Breathing  Post-op Assessment: Report given to RN and Post -op Vital signs reviewed and stable  Post vital signs: Reviewed and stable  Last Vitals:  Vitals Value Taken Time  BP    Temp    Pulse    Resp    SpO2      Last Pain:  Vitals:   05/30/18 0740  TempSrc: Oral  PainSc: 6       Patients Stated Pain Goal: 6 (91/06/81 [id number])  Complications: No apparent anesthesia complications

## 2018-05-30 NOTE — Brief Op Note (Signed)
05/30/2018  9:05 AM  PATIENT:  Briscoe Burns  84 y.o. female  PRE-OPERATIVE DIAGNOSIS:  RIGHT CARPAL TUNNEL SYNDROME  POST-OPERATIVE DIAGNOSIS:  RIGHT CARPAL TUNNEL SYNDROME  PROCEDURE:  Procedure(s): RIGHT CARPAL TUNNEL RELEASE (Right)  SURGEON:  Surgeon(s) and Role:    * Daryll Brod, MD - Primary  PHYSICIAN ASSISTANT:   ASSISTANTS: none   ANESTHESIA:   local, regional and IV sedation  EBL: 65ml  BLOOD ADMINISTERED:none  DRAINS: none   LOCAL MEDICATIONS USED:  BUPIVICAINE   SPECIMEN:  No Specimen  DISPOSITION OF SPECIMEN:  N/A  COUNTS:  YES  TOURNIQUET:   Total Tourniquet Time Documented: Forearm (Right) - 18 minutes Total: Forearm (Right) - 18 minutes   DICTATION: .Dragon Dictation  PLAN OF CARE: Discharge to home after PACU  PATIENT DISPOSITION:  PACU - hemodynamically stable.

## 2018-05-30 NOTE — Anesthesia Procedure Notes (Signed)
Anesthesia Regional Block: Bier block (IV Regional)   Pre-Anesthetic Checklist: ,, timeout performed, Correct Patient, Correct Site, Correct Laterality, Correct Procedure,, site marked, surgical consent,, at surgeon's request  Laterality: Right     Needles:  Injection technique: Single-shot  Needle Type: Other      Needle Gauge: 22     Additional Needles:   Procedures:,,,,, intact distal pulses, Esmarch exsanguination, single tourniquet utilized,  Narrative:  Start time: 05/30/2018 7:38 AM End time: 05/30/2018 7:38 AM  Performed by: Personally

## 2018-05-30 NOTE — Anesthesia Procedure Notes (Signed)
Anesthesia Procedure Note     

## 2018-05-30 NOTE — H&P (Signed)
Susan Haney is an 84 y.o. female.   Chief Complaint: numbness right handHPI:[name] is an 84 year old right-hand-dominant female who is referred by Dr. Melina Copa for consultation regarding numbness tingling pain in her hands bilaterally right greater than left. This been going on for several years. She has no discrete history of injury to the hand neck or elbow. She is complaining some discomfort numbness and tingling up to her elbow. She is awakened 7 out of 7 nights has a relatively constant pain in all the fingers of her hand. She states shaking at night helps. Use seems to help it during the day. She has pain in the basilar area of her thumb with a VAS score 9-10/10. She states that she occasionally drops things. She has not had any treatment.Her nerve conductions are reviewed with her. This reveals bilateral carpal tunnel syndromes with a overlying chronic lower cervical radiculopathy on the left side. Her motor delay is 5.1 on the right 4.5 on the left .She has been referred to Dr. Brigitte Pulse who has seen her. She has a history of diabetes and arthritis and gout. She has no history of thyroid problems. Family history is positive diabetes negative for thyroid problems arthritis and gout.    Past Medical History:  Diagnosis Date  . Anemia    history ,  took iron for years  . Arthritis   . Bicuspid aortic valve   . Cancer [facility])    endometrial cancer  . Cellulitis 12/31/2017   BLE, ankles  . Chronic back pain   . Diabetes mellitus without complication ([place])   . Endometrial cancer ([place])   . Family history of cancer   . Family history of prostate cancer   . GERD (gastroesophageal reflux disease)   . History of radiation therapy 06/15/16-07/06/16   vaginal cuff 30 Gy in 5 fractions  . Hypertension     Past Surgical History:  Procedure Laterality Date  . BUNIONECTOMY    . COLONOSCOPY N/A 04/04/2014   Procedure: COLONOSCOPY;  Surgeon: Rogene Houston, MD;  [facility];  Service:  Endoscopy;  Laterality: N/A;  100  . EYE SURGERY     bil cataracts and lens implants  . FOOT SURGERY     Left  . Ovary removed: benign    . REPLACEMENT TOTAL KNEE BILATERAL     2013  . ROBOTIC ASSISTED TOTAL HYSTERECTOMY WITH BILATERAL SALPINGO OOPHERECTOMY Left 04/13/2016   Procedure: XI ROBOTIC ASSISTED TOTAL HYSTERECTOMY WITH LEFT SALPINGO OOPHORECTOMY SENTINEL LYMPH NODE BIOPSY;  Surgeon: Everitt Amber, MD;  [facility];  Service: Gynecology;  Laterality: Left;  . TOTAL KNEE REVISION Left 02/12/2016   Procedure: LEFT TOTAL KNEE REVISION;  Surgeon: Rod Can, MD;  Location: [facility];  Service: Orthopedics;  Laterality: Left;    Family History  Problem Relation Age of Onset  . Prostate cancer Father 75  . Diabetes Maternal Grandmother   . Heart attack Maternal Grandfather   . Cancer Sister        unknown primary  . Cancer Other 42       possibly pancreatic  . Cancer Other        possibly uterine or ovarian   Social History:  reports that she has never smoked. She has never used smokeless tobacco. She reports that she does not drink alcohol or use drugs.  Allergies: No Known Allergies  No medications prior to admission.    No results found for this or any previous visit (from  the past 48 hour(s)).  No results found.   Pertinent items are noted in HPI.  Height 5\' 3"  (1.6 m), weight 90.3 kg.  General appearance: alert, cooperative and appears stated age Head: Normocephalic, without obvious abnormality Neck: no JVD Resp: clear to auscultation bilaterally Cardio: regular rate and rhythm, S1, S2 normal, no murmur, click, rub or gallop GI: soft, non-tender; bowel sounds normal; no masses,  no organomegaly Extremities: numb right hand Pulses: 2+ and symmetric Skin: Skin color, texture, turgor normal. No rashes or lesions Neurologic: Grossly normal Incision/Wound: na  Assessment/Plan Assessment:  1. Bilateral carpal tunnel syndrome  2. Primary osteoarthritis of  both hands  3. Primary osteoarthritis of both first carpometacarpal joints    Plan: She would like to have the carpal tunnel release without doing anything to the [facility] joints on her right side. Pre-peri-and postoperative course are discussed along with risk applications. She is aware that there is no guarantee to the surgery the possibility of infection recurrence injury to arteries nerves tendons complete relief symptoms and dystrophy. She is scheduled for right carpal tunnel release in outpatient under regional anesthesia.   Daryll Brod 05/30/2018, 5:23 AM

## 2018-05-30 NOTE — Anesthesia Postprocedure Evaluation (Signed)
Anesthesia Post Note  Patient: Susan Haney  Procedure(s) Performed: RIGHT CARPAL TUNNEL RELEASE (Right Wrist)     Patient location during evaluation: PACU Anesthesia Type: Bier Block Level of consciousness: awake and alert Pain management: pain level controlled Vital Signs Assessment: post-procedure vital signs reviewed and stable Respiratory status: spontaneous breathing Cardiovascular status: stable Anesthetic complications: no    Last Vitals:  Vitals:   05/30/18 0930 05/30/18 0950  BP: (!) 126/59 (!) 135/53  Pulse: 78 89  Resp: 16 20  Temp:  36.6 C  SpO2: 96% 94%    Last Pain:  Vitals:   05/30/18 0950  TempSrc:   PainSc: 0-No pain                 Nolon Nations

## 2018-05-30 NOTE — Discharge Instructions (Addendum)

## 2018-05-31 ENCOUNTER — Encounter (HOSPITAL_BASED_OUTPATIENT_CLINIC_OR_DEPARTMENT_OTHER): Payer: Self-pay | Admitting: Orthopedic Surgery

## 2018-07-11 ENCOUNTER — Telehealth: Payer: Self-pay | Admitting: *Deleted

## 2018-07-11 NOTE — Telephone Encounter (Signed)
CALLED PATIENT TO ALTER FU APPT. FOR 07-17-18 DUE TO DR. KINARD BEING IN THE OR, RESCHEDULED FOR 07-24-18 @ 4 PM, PATIENT AGREED TO NEW TIME AND DATE

## 2018-07-17 ENCOUNTER — Ambulatory Visit: Payer: Medicare Other | Admitting: Radiation Oncology

## 2018-07-24 ENCOUNTER — Ambulatory Visit
Admission: RE | Admit: 2018-07-24 | Discharge: 2018-07-24 | Disposition: A | Discharge status: Home / Self Care | Payer: Medicare Other | Source: Ambulatory Visit | Attending: Radiation Oncology | Admitting: Radiation Oncology

## 2018-07-24 ENCOUNTER — Encounter: Payer: Self-pay | Admitting: Radiation Oncology

## 2018-07-24 ENCOUNTER — Other Ambulatory Visit: Payer: Self-pay

## 2018-07-24 VITALS — BP 170/61 | HR 78 | Temp 98.2°F | Resp 16 | Wt 201.6 lb

## 2018-07-24 DIAGNOSIS — Z79899 Other long term (current) drug therapy: Secondary | ICD-10-CM | POA: Insufficient documentation

## 2018-07-24 DIAGNOSIS — C541 Malignant neoplasm of endometrium: Secondary | ICD-10-CM | POA: Diagnosis present

## 2018-07-24 NOTE — Progress Notes (Signed)
Pt presents today for f/u with Dr. Sondra Come. Pt reports severe pain in left knee, rated "almost a 10"/10. Pt denies dysuria/hematuria. Pt denies vaginal bleeding/discharge. Pt denies rectal bleeding, diarrhea. Pt reports constipation that is relieved by daily use of stool softeners. Pt denies abdominal bloating, N/V.   BP (!) 170/61 (Patient Position: Sitting)   Pulse 78   Temp 98.2 F (36.8 C) (Oral)   Resp 16   Wt 201 lb 9.6 oz (91.4 kg)   SpO2 100%   BMI 35.71 kg/m   Wt Readings from Last 3 Encounters:  07/24/18 201 lb 9.6 oz (91.4 kg)  05/30/18 199 lb 11.8 oz (90.6 kg)  04/17/18 199 lb (90.3 kg)   Loma Sousa, RN BSN

## 2018-07-24 NOTE — Progress Notes (Signed)
Radiation Oncology         [phone] ________________________________  Name: Susan Haney MRN: [id number]  Date: 07/24/2018  DOB: April 06, 1932  Follow-Up Visit Note  CC: Octavio Graves, DO  Everitt Amber, MD    ICD-10-CM   1. Endometrial cancer ([place]) C54.1     Diagnosis:   85 y.o. female with Stage IB grade 2 endometrioid adenocarcinoma with high/intermediate risk factors and microsatellite instability on routine tumor testing  Interval Since Last Radiation:  2 years  Radiation treatment dates:   06/15/2016, 06/22/2016, 06/29/2016, 07/01/2016, 07/06/2016 Site/dose:  Vaginal Cuff / 30 Gy in 5 fractions  Narrative:  The patient returns today for routine follow-up. She is doing well overall.   She reports severe left knee pain for which she is using a cane to walk. She has a knee replacement. She reports having discontinued use of her dilator. Her previous bowel issues have resolved.   ALLERGIES:  has No Known Allergies.  Meds: Current Outpatient Medications  Medication Sig Dispense Refill  . aspirin EC 81 MG tablet Take 81 mg by mouth daily.    . cholecalciferol (VITAMIN D) 400 UNITS TABS tablet Take 2,000 Units by mouth.    . desonide (DESOWEN) 0.05 % ointment Apply 1 application topically 2 (two) times daily. appy to breast skin at site of rash until resolution of symptoms 15 g 0  . docusate sodium (COLACE) 100 MG capsule Take 100 mg by mouth 2 (two) times daily.    . hydrochlorothiazide (HYDRODIURIL) 25 MG tablet TAKE 1 TABLET BY MOUTH EVERY DAY IN THE MORNING  4  . lisinopril-hydrochlorothiazide (PRINZIDE,ZESTORETIC) 10-12.5 MG tablet Take 1 tablet by mouth daily.    . metFORMIN (GLUMETZA) 500 MG (MOD) 24 hr tablet Take 500 mg by mouth every evening.    . Multiple Vitamins-Minerals (CENTRUM SILVER ADULT 50+) TABS Take 1 tablet by mouth daily.     . naproxen (NAPROSYN) 500 MG tablet Take 500 mg by mouth 2 (two) times daily with a meal.    . Omega-3 Fatty Acids (OMEGA 3 500  PO) Take 4 tablets by mouth.     Marland Kitchen omeprazole (PRILOSEC) 20 MG capsule TAKE 1 CAPSULE (20 MG TOTAL) BY MOUTH DAILY. 90 capsule 3  . sitaGLIPtin (JANUVIA) 100 MG tablet Januvia 100 mg tablet    . triamcinolone cream (KENALOG) 0.1 % APPLY TO AFFECTED AREA ONE TO TWO TIMES DAILY (NOT FOR FACE/SKINFOLDS)  6  . traMADol (ULTRAM) 50 MG tablet Take 1 tablet (50 mg total) by mouth every 6 (six) hours as needed. (Patient not taking: Reported on 07/24/2018) 20 tablet 0   No current facility-administered medications for this encounter.     Physical Findings: The patient is in no acute distress. Patient is alert and oriented.  weight is 201 lb 9.6 oz (91.4 kg). Her oral temperature is 98.2 F (36.8 C). Her blood pressure is 170/61 (abnormal) and her pulse is 78. Her respiration is 16 and oxygen saturation is 100%.   Lungs are clear to auscultation bilaterally. Heart has regular rate and rhythm. No palpable cervical, supraclavicular, or axillary adenopathy. Abdomen soft, non-tender, normal bowel sounds.  On pelvic examination the external genitalia were unremarkable. A speculum exam was performed. There are no mucosal lesions noted in the vaginal vault. On bimanual and rectovaginal examination there were no pelvic masses appreciated.  Patient has less edema along the left leg, no signs of infection. She has  significant scarring along the left knee from her prior  surgeries  Lab Findings: Lab Results  Component Value Date   WBC 13.2 (H) 04/14/2016   HGB 9.4 (L) 04/14/2016   HCT 29.8 (L) 04/14/2016   MCV 88.2 04/14/2016   PLT 288 04/14/2016    Radiographic Findings: No results found.  Impression:  No evidence of recurrence on clinical exam.   Plan:  Follow up with Dr. Denman George in early July. Routine follow-up in radiation oncology in 1 year.    ____________________________________  Blair Promise, PhD, MD  This document serves as a record of services personally performed by Gery Pray, MD. It  was created on his behalf by Mary-Margaret Loma Messing, a trained medical scribe. The creation of this record is based on the scribe's personal observations and the provider's statements to them. This document has been checked and approved by the attending provider.

## 2018-09-20 ENCOUNTER — Encounter (INDEPENDENT_AMBULATORY_CARE_PROVIDER_SITE_OTHER): Payer: Self-pay | Admitting: Internal Medicine

## 2018-09-20 ENCOUNTER — Ambulatory Visit (INDEPENDENT_AMBULATORY_CARE_PROVIDER_SITE_OTHER): Payer: Medicare Other | Admitting: Internal Medicine

## 2018-09-20 VITALS — BP 150/72 | HR 87 | Temp 98.0°F | Ht 63.0 in | Wt 187.3 lb

## 2018-09-20 DIAGNOSIS — K219 Gastro-esophageal reflux disease without esophagitis: Secondary | ICD-10-CM

## 2018-09-20 DIAGNOSIS — K5900 Constipation, unspecified: Secondary | ICD-10-CM | POA: Diagnosis not present

## 2018-09-20 NOTE — Patient Instructions (Addendum)
Amitiza daily.(1). OV in 1 year. Continue the Omeprazole

## 2018-09-20 NOTE — Progress Notes (Signed)
Subjective:    Patient ID: Susan Haney, female    DOB: 08-15-1931, 85 y.o.   MRN: [id number]  HPI Here today for f/u. Last seen in March of 2019 for constipation. Covered with Amitiza as needed. Her appetite is good. She has lost weight which was intentional. She is on a Keto diet. Most days she she has a BM. Her stools are small. She does have to strain. She is having pain in her left knee.   Her last colonoscopy in September of 2015 by Dr. Laural Golden (average risk): Impression:  Examination performed to cecum. Single left-sided diverticulum and external hemorrhoids otherwise normal colonoscopy. Has recent hx of in September of 2017 (endometrial cancer): Robotic assisted laparoscopic total hysterectomy with rt salphingoophorectomy by Dr. Denman George. She did receive radiation treatment.  Review of Systems Past Medical History:  Diagnosis Date  . Anemia    history ,  took iron for years  . Arthritis   . Bicuspid aortic valve   . Cancer [facility])    endometrial cancer  . Cellulitis 12/31/2017   BLE, ankles  . Chronic back pain   . Diabetes mellitus without complication ([place])   . Endometrial cancer ([place])   . Family history of cancer   . Family history of prostate cancer   . GERD (gastroesophageal reflux disease)   . History of radiation therapy 06/15/16-07/06/16   vaginal cuff 30 Gy in 5 fractions  . Hypertension     Past Surgical History:  Procedure Laterality Date  . BUNIONECTOMY    . CARPAL TUNNEL RELEASE Right 05/30/2018   Procedure: RIGHT CARPAL TUNNEL RELEASE;  Surgeon: Daryll Brod, MD;  Location: [facility];  Service: Orthopedics;  Laterality: Right;  . COLONOSCOPY N/A 04/04/2014   Procedure: COLONOSCOPY;  Surgeon: Rogene Houston, MD;  [facility];  Service: Endoscopy;  Laterality: N/A;  100  . EYE SURGERY     bil cataracts and lens implants  . FOOT SURGERY     Left  . Ovary removed: benign    . REPLACEMENT TOTAL KNEE BILATERAL     2013  .  ROBOTIC ASSISTED TOTAL HYSTERECTOMY WITH BILATERAL SALPINGO OOPHERECTOMY Left 04/13/2016   Procedure: XI ROBOTIC ASSISTED TOTAL HYSTERECTOMY WITH LEFT SALPINGO OOPHORECTOMY SENTINEL LYMPH NODE BIOPSY;  Surgeon: Everitt Amber, MD;  [facility];  Service: Gynecology;  Laterality: Left;  . TOTAL KNEE REVISION Left 02/12/2016   Procedure: LEFT TOTAL KNEE REVISION;  Surgeon: Rod Can, MD;  [facility];  Service: Orthopedics;  Laterality: Left;    No Known Allergies  Current Outpatient Medications on File Prior to Visit  Medication Sig Dispense Refill  . aspirin EC 81 MG tablet Take 81 mg by mouth daily.    Marland Kitchen docusate sodium (COLACE) 100 MG capsule Take 100 mg by mouth 2 (two) times daily.    . ferrous sulfate 325 (65 FE) MG EC tablet Take 325 mg by mouth 3 (three) times daily with meals.    . hydrochlorothiazide (HYDRODIURIL) 25 MG tablet TAKE 1 TABLET BY MOUTH EVERY DAY IN THE MORNING  4  . lisinopril-hydrochlorothiazide (PRINZIDE,ZESTORETIC) 10-12.5 MG tablet Take 1 tablet by mouth daily.    Marland Kitchen lubiprostone (AMITIZA) 24 MCG capsule Take 24 mcg by mouth as needed for constipation.    . Misc Natural Products (TART CHERRY ADVANCED) CAPS Take by mouth.    . Omega-3 Fatty Acids (OMEGA 3 500 PO) Take 4 tablets by mouth.     Marland Kitchen omeprazole (PRILOSEC)  20 MG capsule TAKE 1 CAPSULE (20 MG TOTAL) BY MOUTH DAILY. 90 capsule 3  . traMADol (ULTRAM) 50 MG tablet Take 1 tablet (50 mg total) by mouth every 6 (six) hours as needed. 20 tablet 0  . cholecalciferol (VITAMIN D) 400 UNITS TABS tablet Take 2,000 Units by mouth.    . desonide (DESOWEN) 0.05 % ointment Apply 1 application topically 2 (two) times daily. appy to breast skin at site of rash until resolution of symptoms 15 g 0   No current facility-administered medications on file prior to visit.         Objective:   Physical Exam Blood pressure (!) 150/72, pulse 87, temperature 98 F (36.7 C), height 5\' 3"  (1.6 m), weight 187 lb 4.8 oz (85  kg). Alert and oriented. Skin warm and dry. Oral mucosa is moist.   . Sclera anicteric, conjunctivae is pink. Thyroid not enlarged. No cervical lymphadenopathy. Lungs clear. Heart regular rate and rhythm.  Abdomen is soft. Bowel sounds are positive. No hepatomegaly. No abdominal masses felt. No tenderness.  No edema to lower extremities.           Assessment & Plan:  Constipation. Take the Amitiza daily not as prn. Drink pleDrink plenty of water. OV in 1 year.

## 2018-10-25 ENCOUNTER — Telehealth: Payer: Self-pay | Admitting: *Deleted

## 2018-10-25 NOTE — Telephone Encounter (Signed)
CALLED PATIENT TO INFORM OF FU APPT. FOR 07-23-19 @ 11 AM , SPOKE WITH PATIENT AND SHE IS AWARE OF THIS APPT.

## 2018-12-22 ENCOUNTER — Telehealth: Payer: Self-pay | Admitting: *Deleted

## 2018-12-22 NOTE — Telephone Encounter (Signed)
Patient called and scheduled an appt for June  °

## 2019-01-04 ENCOUNTER — Telehealth: Payer: Self-pay | Admitting: *Deleted

## 2019-01-04 NOTE — Telephone Encounter (Signed)
Patient called and moved her appt from 6/22 to 7/24 due to COVID

## 2019-01-08 ENCOUNTER — Inpatient Hospital Stay: Payer: Medicare Other | Admitting: Gynecologic Oncology

## 2019-02-09 ENCOUNTER — Encounter: Payer: Self-pay | Admitting: Gynecologic Oncology

## 2019-02-09 ENCOUNTER — Inpatient Hospital Stay: Payer: Medicare Other | Attending: Gynecologic Oncology | Admitting: Gynecologic Oncology

## 2019-02-09 ENCOUNTER — Other Ambulatory Visit: Payer: Self-pay

## 2019-02-09 VITALS — BP 161/57 | HR 73 | Temp 99.2°F | Resp 18 | Ht 63.0 in | Wt 186.2 lb

## 2019-02-09 DIAGNOSIS — R600 Localized edema: Secondary | ICD-10-CM | POA: Insufficient documentation

## 2019-02-09 DIAGNOSIS — C541 Malignant neoplasm of endometrium: Secondary | ICD-10-CM

## 2019-02-09 DIAGNOSIS — N939 Abnormal uterine and vaginal bleeding, unspecified: Secondary | ICD-10-CM | POA: Diagnosis not present

## 2019-02-09 DIAGNOSIS — M79662 Pain in left lower leg: Secondary | ICD-10-CM | POA: Insufficient documentation

## 2019-02-09 DIAGNOSIS — Z8542 Personal history of malignant neoplasm of other parts of uterus: Secondary | ICD-10-CM | POA: Diagnosis not present

## 2019-02-09 DIAGNOSIS — B372 Candidiasis of skin and nail: Secondary | ICD-10-CM | POA: Diagnosis not present

## 2019-02-09 DIAGNOSIS — Z923 Personal history of irradiation: Secondary | ICD-10-CM | POA: Insufficient documentation

## 2019-02-09 DIAGNOSIS — Z90722 Acquired absence of ovaries, bilateral: Secondary | ICD-10-CM

## 2019-02-09 DIAGNOSIS — Z90710 Acquired absence of both cervix and uterus: Secondary | ICD-10-CM | POA: Diagnosis not present

## 2019-02-09 DIAGNOSIS — Z08 Encounter for follow-up examination after completed treatment for malignant neoplasm: Secondary | ICD-10-CM | POA: Insufficient documentation

## 2019-02-09 MED ORDER — NYSTATIN 100000 UNIT/GM EX POWD: Freq: Two times a day (BID) | CUTANEOUS | 0 refills | Status: DC

## 2019-02-09 NOTE — Patient Instructions (Signed)
Please notify Dr Denman George at phone number [phone] if you notice vaginal bleeding, new pelvic or abdominal pains, bloating, feeling full easy, or a change in bladder or bowel function.   Dr Denman George has prescribed nystatin powder twice a day until the rash on your abdomen has resolved.  Please contact Dr Serita Grit office (at [phone]) in October, 2020 to request an appointment with her for January, 2021.

## 2019-02-09 NOTE — Progress Notes (Signed)
Follow-up Note: Endometrial cancer   Susan Haney 85 y.o. female  Chief Complaint  Patient presents with  . endometrial cancer    follow-up  history of endometrial cancer  Assessment : 85 year old woman with stage IB grade 2 endometrioid endometrial adenocarcinoma with high/intermediate risk factors and Microsatellite instability on routine tumor testing, Lynch syndrome negative. s/p vaginal brachytherapy (completed December, 2017). No evidence for recurrence.  Candidiasis of abdominal skin fold   Plan:  I recommend she follow-up at 3 monthly intervals for symptom review, physical examination and pelvic examination. Pap smear is not recommended in routine endometrial cancer surveillance. We will continue these visits every 6 months, until 5 years.  Nystatin powder for rash.   HPI: The patient had onset of postmenopausal bleeding approximately a month ago. This was not  associated with any pain or other symptoms. An endometrial biopsy was obtained showing a grade 1-2 endometrial adenocarcinoma. Pelvic ultrasound showed the uterus to measure 7 x 4.4 x 5.6 cm with 2 small fibroids. The left ovary surgically absent right ovary appeared normal there is no free fluid.  Today patient is having some vaginal bleeding. She also notes some increased edema and pain in her left lower extremity below the knee.  The patient has a past history of a benign left ovarian cyst which was removed laparoscopically. The patient underwent menopause at age 58. Pap smears are normal  Obstetrical history gravida 2 para 2  Comorbidities include diabetes valvular heart disease osteoporosis lumbar disc disease and obesity.  Her original surgery date had to be postponed when she acutely fell and fractured her left distal femur assoicated with a knee replacement on 02/12/16. She required emergent repair and replacement and had been in rehab since that time.   On 04/13/16 we performed robotic assisted total  hysterectomy, BSO, bilateral SLN biopsy. Final pathology revealed a 4.3cm FIGO grade 2 tumor with 10 of 6m myometrial invasion, negative LVSI and cervical stromal involvement. Bilateral SLN's were negative. 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 Genetic testing negative.  She went on to receive vaginal brachytherapy in accordance with NCCN guidelines: 06/15/16-07/06/16 30 Gy in 5 fractions to the vaginal cuff.  Interval Hx: She has no vaginal bleeding or symptoms concerning for recurrence. Though she does have difficulty evacuating stool. She has been seen by a GI for this and is on a GI regimen. Her symptoms are consistent with rectocele- difficulty emptying.   She has a rash on pannus fold on the right.   Review of Systems:10 point review of systems is negative except as noted in interval history. She does have new breast rash (similar to what she had on abdomen which resolved with triamcinolone ointment which she has not used on breast due to caution about its use on breast/face).   Vitals: Blood pressure (!) 161/57, pulse 73, temperature 99.2 F (37.3 C), temperature source Oral, resp. rate 18, height '5\' 3"'  (1.6 m), weight 186 lb 3.2 oz (84.5 kg), SpO2 99 %.  Physical Exam: General : The patient is a healthy woman in no acute distress.  HEENT: normocephalic, extraoccular movements normal; neck is supple without thyromegally  Lynphnodes: Supraclavicular and inguinal nodes not enlarged  Abdomen: Soft, non-tender, no ascites, no organomegally, no masses, no hernias. Incisions well healed. + candidal rash on pannus right.  Pelvic:  EGBUS: Normal female (atrophic) Vagina: Normal, atrophic, no lesions. Cuff healing normally and in tact with no blood. Urethra and Bladder: Normal, non-tender  Cervix & uterus surgically absent. Bi-manual examination: cuff in tact. No lesions. + vaginal prolapse/relaxation consistent with rectocele Rectal: normal sphincter tone, no  masses, no blood  Lower extremities: incision healing normally on left leg/knee with no signs of infection. Can flex to 110 degrees.      No Known Allergies  Past Medical History:  Diagnosis Date  . Anemia    history ,  took iron for years  . Arthritis   . Bicuspid aortic valve   . Cancer [facility])    endometrial cancer  . Cellulitis 12/31/2017   BLE, ankles  . Chronic back pain   . Diabetes mellitus without complication ([place])   . Endometrial cancer ([place])   . Family history of cancer   . Family history of prostate cancer   . GERD (gastroesophageal reflux disease)   . History of radiation therapy 06/15/16-07/06/16   vaginal cuff 30 Gy in 5 fractions  . Hypertension     Past Surgical History:  Procedure Laterality Date  . BUNIONECTOMY    . CARPAL TUNNEL RELEASE Right 05/30/2018   Procedure: RIGHT CARPAL TUNNEL RELEASE;  Surgeon: Daryll Brod, MD;  Location: [place];  Service: Orthopedics;  Laterality: Right;  . COLONOSCOPY N/A 04/04/2014   Procedure: COLONOSCOPY;  Surgeon: Rogene Houston, MD;  [facility];  Service: Endoscopy;  Laterality: N/A;  100  . EYE SURGERY     bil cataracts and lens implants  . FOOT SURGERY     Left  . Ovary removed: benign    . REPLACEMENT TOTAL KNEE BILATERAL     2013  . ROBOTIC ASSISTED TOTAL HYSTERECTOMY WITH BILATERAL SALPINGO OOPHERECTOMY Left 04/13/2016   Procedure: XI ROBOTIC ASSISTED TOTAL HYSTERECTOMY WITH LEFT SALPINGO OOPHORECTOMY SENTINEL LYMPH NODE BIOPSY;  Surgeon: Everitt Amber, MD;  [facility];  Service: Gynecology;  Laterality: Left;  . TOTAL KNEE REVISION Left 02/12/2016   Procedure: LEFT TOTAL KNEE REVISION;  Surgeon: Rod Can, MD;  [facility];  Service: Orthopedics;  Laterality: Left;    Current Outpatient Medications  Medication Sig Dispense Refill  . aspirin EC 81 MG tablet Take 81 mg by mouth daily.    . cholecalciferol (VITAMIN D) 400 UNITS TABS tablet Take 2,000 Units by mouth.     . desonide (DESOWEN) 0.05 % ointment Apply 1 application topically 2 (two) times daily. appy to breast skin at site of rash until resolution of symptoms 15 g 0  . docusate sodium (COLACE) 100 MG capsule Take 100 mg by mouth 2 (two) times daily.    . ferrous sulfate 325 (65 FE) MG EC tablet Take 325 mg by mouth 3 (three) times daily with meals.    . hydrochlorothiazide (HYDRODIURIL) 25 MG tablet TAKE 1 TABLET BY MOUTH EVERY DAY IN THE MORNING  4  . lisinopril-hydrochlorothiazide (PRINZIDE,ZESTORETIC) 10-12.5 MG tablet Take 1 tablet by mouth daily.    Marland Kitchen lubiprostone (AMITIZA) 24 MCG capsule Take 24 mcg by mouth as needed for constipation.    . Misc Natural Products (TART CHERRY ADVANCED) CAPS Take by mouth.    . nystatin (MYCOSTATIN/NYSTOP) powder Apply topically 2 (two) times daily. 15 g 0  . Omega-3 Fatty Acids (OMEGA 3 500 PO) Take 4 tablets by mouth.     Marland Kitchen omeprazole (PRILOSEC) 20 MG capsule TAKE 1 CAPSULE (20 MG TOTAL) BY MOUTH DAILY. 90 capsule 3  . traMADol (ULTRAM) 50 MG tablet Take 1 tablet (50 mg total) by mouth every 6 (six) hours as  needed. 20 tablet 0   No current facility-administered medications for this visit.     Social History   Socioeconomic History  . Marital status: Widowed    Spouse name: Not on file  . Number of children: 2  . Years of education: Not on file  . Highest education level: Not on file  Occupational History  . Not on file  Social Needs  . Financial resource strain: Not on file  . Food insecurity    Worry: Not on file    Inability: Not on file  . Transportation needs    Medical: Not on file    Non-medical: Not on file  Tobacco Use  . Smoking status: Never Smoker  . Smokeless tobacco: Never Used  Substance and Sexual Activity  . Alcohol use: No    Alcohol/week: 0.0 standard drinks  . Drug use: No  . Sexual activity: Not Currently  Lifestyle  . Physical activity    Days per week: Not on file    Minutes per session: Not on file  . Stress:  Not on file  Relationships  . Social Herbalist on phone: Not on file    Gets together: Not on file    Attends religious service: Not on file    Active member of club or organization: Not on file    Attends meetings of clubs or organizations: Not on file    Relationship status: Not on file  . Intimate partner violence    Fear of current or ex partner: Not on file    Emotionally abused: Not on file    Physically abused: Not on file    Forced sexual activity: Not on file  Other Topics Concern  . Not on file  Social History Narrative  . Not on file    Family History  Problem Relation Age of Onset  . Prostate cancer Father 51  . Diabetes Maternal Grandmother   . Heart attack Maternal Grandfather   . Cancer Sister        unknown primary  . Cancer Other 42       possibly pancreatic  . Cancer Other        possibly uterine or ovarian    Thereasa Solo, MD 02/09/2019, 2:25 PM  CC: Dr Octavio Graves

## 2019-02-22 ENCOUNTER — Other Ambulatory Visit (INDEPENDENT_AMBULATORY_CARE_PROVIDER_SITE_OTHER): Payer: Self-pay | Admitting: Nurse Practitioner

## 2019-02-22 ENCOUNTER — Telehealth (INDEPENDENT_AMBULATORY_CARE_PROVIDER_SITE_OTHER): Payer: Self-pay | Admitting: Nurse Practitioner

## 2019-02-22 MED ORDER — LUBIPROSTONE 24 MCG PO CAPS: 24.0000 ug | ORAL_CAPSULE | Freq: Two times a day (BID) | ORAL | 1 refills | Status: DC

## 2019-02-22 NOTE — Telephone Encounter (Signed)
Refill sent.

## 2019-02-22 NOTE — Telephone Encounter (Signed)
Patient left message stating she needs a refill on her Amitiza  - her ph# [phone]

## 2019-03-18 ENCOUNTER — Other Ambulatory Visit (INDEPENDENT_AMBULATORY_CARE_PROVIDER_SITE_OTHER): Payer: Self-pay | Admitting: Nurse Practitioner

## 2019-05-23 LAB — HM DIABETES EYE EXAM

## 2019-07-23 ENCOUNTER — Ambulatory Visit
Admission: RE | Admit: 2019-07-23 | Discharge: 2019-07-23 | Disposition: A | Discharge status: Home / Self Care | Payer: Medicare Other | Source: Ambulatory Visit | Attending: Radiation Oncology | Admitting: Radiation Oncology

## 2019-07-23 ENCOUNTER — Encounter: Payer: Self-pay | Admitting: Radiation Oncology

## 2019-07-23 ENCOUNTER — Other Ambulatory Visit: Payer: Self-pay

## 2019-07-23 VITALS — BP 145/67 | HR 101 | Temp 98.7°F | Resp 18 | Ht 63.0 in | Wt 185.0 lb

## 2019-07-23 DIAGNOSIS — Z923 Personal history of irradiation: Secondary | ICD-10-CM | POA: Diagnosis not present

## 2019-07-23 DIAGNOSIS — C541 Malignant neoplasm of endometrium: Secondary | ICD-10-CM

## 2019-07-23 DIAGNOSIS — Z8542 Personal history of malignant neoplasm of other parts of uterus: Secondary | ICD-10-CM | POA: Insufficient documentation

## 2019-07-23 DIAGNOSIS — Z79899 Other long term (current) drug therapy: Secondary | ICD-10-CM | POA: Insufficient documentation

## 2019-07-23 DIAGNOSIS — Z7982 Long term (current) use of aspirin: Secondary | ICD-10-CM | POA: Insufficient documentation

## 2019-07-23 NOTE — Patient Instructions (Signed)
Coronavirus (COVID-19) Are you at risk?  Are you at risk for the Coronavirus (COVID-19)?  To be considered HIGH RISK for Coronavirus (COVID-19), you have to meet the following criteria:  . Traveled to [place], [place], [place], [place] or [place]; or in the [place] to [place], [place], [place], or [place]; and have fever, cough, and shortness of breath within the last 2 weeks of travel OR . Been in close contact with a person diagnosed with COVID-19 within the last 2 weeks and have fever, cough, and shortness of breath . IF YOU DO NOT MEET THESE CRITERIA, YOU ARE CONSIDERED LOW RISK FOR COVID-19.  What to do if you are HIGH RISK for COVID-19?  . If you are having a medical emergency, call 911. . Seek medical care right away. Before you go to a doctor's office, urgent care or emergency department, call ahead and tell them about your recent travel, contact with someone diagnosed with COVID-19, and your symptoms. You should receive instructions from your physician's office regarding next steps of care.  . When you arrive at healthcare provider, tell the healthcare staff immediately you have returned from visiting [place], [place], [place], [place] or [place]; or traveled in the [place] to [place], [place], [place], or [place]; in the last two weeks or you have been in close contact with a person diagnosed with COVID-19 in the last 2 weeks.   . Tell the health care staff about your symptoms: fever, cough and shortness of breath. . After you have been seen by a medical provider, you will be either: o Tested for (COVID-19) and discharged home on quarantine except to seek medical care if symptoms worsen, and asked to  - Stay home and avoid contact with others until you get your results (4-5 days)  - Avoid travel on public transportation if possible (such as bus, train, or airplane) or o Sent to the Emergency Department by EMS for evaluation, COVID-19 testing, and possible  admission depending on your condition and test results.  What to do if you are LOW RISK for COVID-19?  Reduce your risk of any infection by using the same precautions used for avoiding the common cold or flu:  . Wash your hands often with soap and warm water for at least 20 seconds.  If soap and water are not readily available, use an alcohol-based hand sanitizer with at least 60% alcohol.  . If coughing or sneezing, cover your mouth and nose by coughing or sneezing into the elbow areas of your shirt or coat, into a tissue or into your sleeve (not your hands). . Avoid shaking hands with others and consider head nods or verbal greetings only. . Avoid touching your eyes, nose, or mouth with unwashed hands.  . Avoid close contact with people who are sick. . Avoid places or events with large numbers of people in one location, like concerts or sporting events. . Carefully consider travel plans you have or are making. . If you are planning any travel outside or inside the [place], visit the CDC's Travelers' Health webpage for the latest health notices. . If you have some symptoms but not all symptoms, continue to monitor at home and seek medical attention if your symptoms worsen. . If you are having a medical emergency, call 911.   ADDITIONAL HEALTHCARE OPTIONS FOR PATIENTS  [facility] / e-Visit: [email or web address]         [facility]: [phone]  [facility]: [phone]                   [facility]: [phone]   

## 2019-07-23 NOTE — Progress Notes (Signed)
Susan Haney presents today for f/u with Dr. Sondra Come. Pt reports pain in LEFT knee and back, rated 8-9/10. Pt also c/o pain in bilateral carpal tunnel. Pt denies dysuria/hematuria. Pt denies vaginal bleeding/discharge. Pt denies rectal bleeding. Pt reports constipation and is taking amitiza with good results. Pt denies abdominal bloating, N/V. Pt does reports amitiza does cause nausea without vomiting when pt does take amitiza.  BP (!) 145/67 (BP Location: Left Arm, Patient Position: Sitting)   Pulse (!) 101   Temp 98.7 F (37.1 C) (Temporal)   Resp 18   Ht 5\' 3"  (1.6 m)   Wt 185 lb (83.9 kg)   SpO2 97%   BMI 32.77 kg/m   Wt Readings from Last 3 Encounters:  07/23/19 185 lb (83.9 kg)  02/09/19 186 lb 3.2 oz (84.5 kg)  09/20/18 187 lb 4.8 oz (85 kg)   Loma Sousa, RN BSN

## 2019-07-23 NOTE — Progress Notes (Signed)
Radiation Oncology         [phone] ________________________________  Name: Susan Haney MRN: [id number]  Date: 07/23/2019  DOB: 11/07/1931  Follow-Up Visit Note  CC: Scotty Court, DO  Everitt Amber, MD    ICD-10-CM   1. Endometrial cancer (HCC)  C54.1     Diagnosis: Stage IB grade 2 endometrioid adenocarcinoma with high/intermediate risk factors and microsatellite instability on routine tumor testing  Interval Since Last Radiation: Three years, two weeks, and two days.  Radiation treatment dates:   06/15/2016, 06/22/2016, 06/29/2016, 07/01/2016, 07/06/2016  Site/dose:  Vaginal Cuff / 30 Gy in 5 fractions  Narrative:  The patient returns today for routine follow-up. She is doing well overall.   Since last visit on 07/24/2018, patient was seen by Dr. Denman George on 01/04/2019 for routine follow-up. During that time, she declined any vaginal bleeding that may indicate recurrence.  On review of systems, she reports no new medical problems. She denies bloating or pelvic pain.  ALLERGIES:  has No Known Allergies.  Meds: Current Outpatient Medications  Medication Sig Dispense Refill  . aspirin EC 81 MG tablet Take 81 mg by mouth daily.    . Blood Glucose Monitoring Suppl (ACCU-CHEK AVIVA PLUS) w/Device KIT accu-chek    kit aviva pl    . cholecalciferol (VITAMIN D) 400 UNITS TABS tablet Take 2,000 Units by mouth.    . docusate sodium (COLACE) 100 MG capsule Take 100 mg by mouth 2 (two) times daily.    Marland Kitchen lisinopril-hydrochlorothiazide (PRINZIDE,ZESTORETIC) 10-12.5 MG tablet Take 1 tablet by mouth daily.    Marland Kitchen lubiprostone (AMITIZA) 24 MCG capsule Take 24 mcg by mouth as needed for constipation.    . Misc Natural Products (TART CHERRY ADVANCED) CAPS Take by mouth.    . nystatin (MYCOSTATIN/NYSTOP) powder Apply topically 2 (two) times daily. 15 g 0  . Omega-3 Fatty Acids (OMEGA 3 500 PO) Take 4 tablets by mouth.     Marland Kitchen omeprazole (PRILOSEC) 20 MG capsule TAKE 1 CAPSULE (20 MG  TOTAL) BY MOUTH DAILY. 90 capsule 3  . triamcinolone cream (KENALOG) 0.5 % Apply 1 application topically 2 (two) times daily.    Marland Kitchen lubiprostone (AMITIZA) 24 MCG capsule Take 1 capsule (24 mcg total) by mouth 2 (two) times daily with a meal. As needed (Patient not taking: Reported on 07/23/2019) 60 capsule 1  . traMADol (ULTRAM) 50 MG tablet Take 1 tablet (50 mg total) by mouth every 6 (six) hours as needed. (Patient not taking: Reported on 07/23/2019) 20 tablet 0   No current facility-administered medications for this encounter.    Physical Findings: The patient is in no acute distress. Patient is alert and oriented.  height is '5\' 3"'  (1.6 m) and weight is 185 lb (83.9 kg). Her temporal temperature is 98.7 F (37.1 C). Her blood pressure is 145/67 (abnormal) and her pulse is 101 (abnormal). Her respiration is 18 and oxygen saturation is 97%.   Lungs are clear to auscultation bilaterally. Heart has regular rate and rhythm. No palpable cervical, supraclavicular, or axillary adenopathy. Abdomen soft, non-tender, normal bowel sounds.  On pelvic examination the external genitalia were unremarkable. A speculum exam was performed. There are no mucosal lesions noted in the vaginal vault. On bimanual and rectovaginal examination there were no pelvic masses appreciated.  Patient is noted to have candidiasis of the abdominal skin fold.  Lab Findings: Lab Results  Component Value Date   WBC 13.2 (H) 04/14/2016   HGB 9.4 (L) 04/14/2016  HCT 29.8 (L) 04/14/2016   MCV 88.2 04/14/2016   PLT 288 04/14/2016    Radiographic Findings: No results found.  Impression:  No evidence of recurrence on clinical exam.  Patient was given antifungal powder to place in the abdominal skin folds.  This helped on previous occasions.  Patient will call if she needs additional powder with prescription  Plan:  Follow-up with Dr. Denman George this summer. Routine follow-up with radiation oncology in one year.     ____________________________________  Blair Promise, PhD, MD  This document serves as a record of services personally performed by Gery Pray, MD. It was created on his behalf by Clerance Lav, a trained medical scribe. The creation of this record is based on the scribe's personal observations and the provider's statements to them. This document has been checked and approved by the attending provider.

## 2019-08-03 ENCOUNTER — Other Ambulatory Visit: Payer: Self-pay

## 2019-08-06 ENCOUNTER — Other Ambulatory Visit: Payer: Self-pay

## 2019-08-06 ENCOUNTER — Ambulatory Visit (INDEPENDENT_AMBULATORY_CARE_PROVIDER_SITE_OTHER): Payer: Medicare Other | Admitting: Family Medicine

## 2019-08-06 ENCOUNTER — Encounter: Payer: Self-pay | Admitting: Family Medicine

## 2019-08-06 VITALS — BP 130/74 | HR 90 | Temp 99.0°F | Resp 20 | Ht 63.0 in | Wt 191.0 lb

## 2019-08-06 DIAGNOSIS — E1159 Type 2 diabetes mellitus with other circulatory complications: Secondary | ICD-10-CM

## 2019-08-06 DIAGNOSIS — G8929 Other chronic pain: Secondary | ICD-10-CM | POA: Diagnosis not present

## 2019-08-06 DIAGNOSIS — E559 Vitamin D deficiency, unspecified: Secondary | ICD-10-CM

## 2019-08-06 DIAGNOSIS — C541 Malignant neoplasm of endometrium: Secondary | ICD-10-CM | POA: Diagnosis not present

## 2019-08-06 DIAGNOSIS — E119 Type 2 diabetes mellitus without complications: Secondary | ICD-10-CM

## 2019-08-06 DIAGNOSIS — I10 Essential (primary) hypertension: Secondary | ICD-10-CM | POA: Insufficient documentation

## 2019-08-06 DIAGNOSIS — K219 Gastro-esophageal reflux disease without esophagitis: Secondary | ICD-10-CM

## 2019-08-06 DIAGNOSIS — M5442 Lumbago with sciatica, left side: Secondary | ICD-10-CM | POA: Diagnosis not present

## 2019-08-06 LAB — BAYER DCA HB A1C WAIVED: HB A1C (BAYER DCA - WAIVED): 6.4 % (ref <7.0)

## 2019-08-06 MED ORDER — METHYLPREDNISOLONE ACETATE 40 MG/ML IJ SUSP
40.0000 mg | Freq: Once | INTRAMUSCULAR | Status: AC
  Administered 2019-08-06: 11:00:00 40 mg via INTRAMUSCULAR

## 2019-08-06 NOTE — Patient Instructions (Signed)

## 2019-08-06 NOTE — Progress Notes (Signed)
Subjective:  Patient ID: Susan Haney, female    DOB: Jan 31, 1932, 86 y.o.   MRN: [id number]  Patient Care Team: Scotty Court, DO as PCP - General   Chief Complaint:  Establish Care (new)   HPI: Susan Haney is a 86 y.o. female presenting on 08/06/2019 for Establish Care (new)  86 year old female who presents today to establish care with new PCP as her PCP retired.  Patient has a long history of chronic back pain, bilateral hip pain, and bilateral leg pain.  Patient states she has been to pain clinics in the past without relief of symptoms.  She had a repeat MRI in 2019 that showed significant lumbar spondylosis, facet arthrosis, and central stenosis.  Patient states she has tried several medications including Ultram, morphine, gabapentin, and injections.  She reports none relieved her pain and made her feel weird so she did not take the medications long.  She would like to see neurosurgery.  She does have a history of diabetes and states this is well controlled with diet.  She was on diabetic medications but was stopped due to renal function.  She has a history of high blood pressure that is well controlled.  She denies chest pain, shortness of breath, dizziness, confusion, weakness, headaches, or increased leg swelling.  Does report she does have some neuropathy in her lower extremities but nothing helps with this pain either.  She has high cholesterol and takes omega-3 fatty acids for control.  She tolerates this well.  She does have problems with constipation and takes Colace and Amitiza for this.  She cannot take the Amitiza daily as it causes diarrhea so she will take it every other day. She reports her reflux is well controlled with omeprazole.  No sore throat, cough, voice change, hemoptysis, melena, or hematochezia.  Of endometrial cancer and is followed by GYN and oncology.  She sees radiation oncology every 6 months and gynecology every 6 months.  She denies vaginal bleeding or  pain.  No changes in weight.  Relevant past medical, surgical, family, and social history reviewed and updated as indicated.  Allergies and medications reviewed and updated. Date reviewed: Chart in Epic.   Past Medical History:  Diagnosis Date  . Anemia    history ,  took iron for years  . Arthritis   . Bicuspid aortic valve   . Cancer ([place]) 2017   endometrial cancer / cervical  . Cellulitis 12/31/2017   BLE, ankles  . Chronic back pain   . DDD (degenerative disc disease), lumbar   . Diabetes mellitus without complication ([place])   . Endometrial cancer ([place])   . GERD (gastroesophageal reflux disease)   . History of radiation therapy 06/15/16-07/06/16   vaginal cuff 30 Gy in 5 fractions  . Hypertension   . Spondylosis    lumbar    Past Surgical History:  Procedure Laterality Date  . BUNIONECTOMY    . CARPAL TUNNEL RELEASE Right 05/30/2018   Procedure: RIGHT CARPAL TUNNEL RELEASE;  Surgeon: Daryll Brod, MD;  Location: [facility];  Service: Orthopedics;  Laterality: Right;  . COLONOSCOPY N/A 04/04/2014   Procedure: COLONOSCOPY;  Surgeon: Rogene Houston, MD;  Location: [facility];  Service: Endoscopy;  Laterality: N/A;  100  . EYE SURGERY     bil cataracts and lens implants  . FOOT SURGERY     Left  . Ovary removed: benign    . REPLACEMENT TOTAL KNEE BILATERAL  2013  . ROBOTIC ASSISTED TOTAL HYSTERECTOMY WITH BILATERAL SALPINGO OOPHERECTOMY Left 04/13/2016   Procedure: XI ROBOTIC ASSISTED TOTAL HYSTERECTOMY WITH LEFT SALPINGO OOPHORECTOMY SENTINEL LYMPH NODE BIOPSY;  Surgeon: Everitt Amber, MD;  Location: [facility];  Service: Gynecology;  Laterality: Left;  . TOTAL KNEE REVISION Left 02/12/2016   Procedure: LEFT TOTAL KNEE REVISION;  Surgeon: Rod Can, MD;  Location: [facility];  Service: Orthopedics;  Laterality: Left;    Social History   Socioeconomic History  . Marital status: Widowed    Spouse name: Not on file  . Number of children: 2  . Years of  education: Not on file  . Highest education level: Not on file  Occupational History  . Not on file  Tobacco Use  . Smoking status: Never Smoker  . Smokeless tobacco: Never Used  Substance and Sexual Activity  . Alcohol use: No    Alcohol/week: 0.0 standard drinks  . Drug use: No  . Sexual activity: Not Currently  Other Topics Concern  . Not on file  Social History Narrative  . Not on file   Social Determinants of Health   Financial Resource Strain:   . Difficulty of Paying Living Expenses: Not on file  Food Insecurity:   . Worried About Charity fundraiser in the Last Year: Not on file  . Ran Out of Food in the Last Year: Not on file  Transportation Needs:   . Lack of Transportation (Medical): Not on file  . Lack of Transportation (Non-Medical): Not on file  Physical Activity:   . Days of Exercise per Week: Not on file  . Minutes of Exercise per Session: Not on file  Stress:   . Feeling of Stress : Not on file  Social Connections:   . Frequency of Communication with Friends and Family: Not on file  . Frequency of Social Gatherings with Friends and Family: Not on file  . Attends Religious Services: Not on file  . Active Member of Clubs or Organizations: Not on file  . Attends Archivist Meetings: Not on file  . Marital Status: Not on file  Intimate Partner Violence:   . Fear of Current or Ex-Partner: Not on file  . Emotionally Abused: Not on file  . Physically Abused: Not on file  . Sexually Abused: Not on file    Outpatient Encounter Medications as of 08/06/2019  Medication Sig  . aspirin EC 81 MG tablet Take 81 mg by mouth daily.  . Biotin 1000 MCG tablet Take 1,000 mcg by mouth daily.  . Blood Glucose Monitoring Suppl (ACCU-CHEK AVIVA PLUS) w/Device KIT accu-chek    kit aviva pl  . cholecalciferol (VITAMIN D) 400 UNITS TABS tablet Take 2,000 Units by mouth.  . docusate sodium (COLACE) 100 MG capsule Take 100 mg by mouth 2 (two) times daily.  .  hydrochlorothiazide (HYDRODIURIL) 25 MG tablet Take 12.5 mg by mouth daily.  Marland Kitchen lubiprostone (AMITIZA) 24 MCG capsule Take 1 capsule (24 mcg total) by mouth 2 (two) times daily with a meal. As needed  . Misc Natural Products (TART CHERRY ADVANCED) CAPS Take by mouth.  . Omega-3 Fatty Acids (OMEGA 3 500 PO) Take 4 tablets by mouth.   Marland Kitchen omeprazole (PRILOSEC) 20 MG capsule TAKE 1 CAPSULE (20 MG TOTAL) BY MOUTH DAILY.  . [DISCONTINUED] lisinopril-hydrochlorothiazide (PRINZIDE,ZESTORETIC) 10-12.5 MG tablet Take 1 tablet by mouth daily.  . [DISCONTINUED] lubiprostone (AMITIZA) 24 MCG capsule Take 24 mcg by mouth as needed for constipation.  . [  DISCONTINUED] nystatin (MYCOSTATIN/NYSTOP) powder Apply topically 2 (two) times daily.  . [DISCONTINUED] traMADol (ULTRAM) 50 MG tablet Take 1 tablet (50 mg total) by mouth every 6 (six) hours as needed. (Patient not taking: Reported on 07/23/2019)  . [DISCONTINUED] triamcinolone cream (KENALOG) 0.5 % Apply 1 application topically 2 (two) times daily.  . [EXPIRED] methylPREDNISolone acetate (DEPO-MEDROL) injection 40 mg    No facility-administered encounter medications on file as of 08/06/2019.    No Known Allergies  Review of Systems  Constitutional: Negative for activity change, appetite change, chills, diaphoresis, fatigue, fever and unexpected weight change.  HENT: Negative.   Eyes: Negative.  Negative for photophobia and visual disturbance.  Respiratory: Negative for cough, chest tightness and shortness of breath.   Cardiovascular: Positive for leg swelling. Negative for chest pain and palpitations.  Gastrointestinal: Negative for abdominal distention, abdominal pain, anal bleeding, blood in stool, constipation, diarrhea, nausea, rectal pain and vomiting.  Endocrine: Negative.  Negative for polydipsia, polyphagia and polyuria.  Genitourinary: Negative for decreased urine volume, difficulty urinating, dysuria, flank pain, frequency, urgency, vaginal bleeding  and vaginal pain.  Musculoskeletal: Positive for arthralgias, back pain, gait problem and myalgias.  Skin: Negative.  Negative for color change.  Allergic/Immunologic: Negative.   Neurological: Negative for dizziness, tremors, seizures, syncope, facial asymmetry, speech difficulty, weakness, light-headedness, numbness and headaches.  Hematological: Negative.  Does not bruise/bleed easily.  Psychiatric/Behavioral: Negative for confusion, hallucinations, sleep disturbance and suicidal ideas.  All other systems reviewed and are negative.       Objective:  BP 130/74   Pulse 90   Temp 99 F (37.2 C)   Resp 20   Ht '5\' 3"'$  (1.6 m)   Wt 191 lb (86.6 kg)   SpO2 95%   BMI 33.83 kg/m    Wt Readings from Last 3 Encounters:  08/06/19 191 lb (86.6 kg)  07/23/19 185 lb (83.9 kg)  02/09/19 186 lb 3.2 oz (84.5 kg)    Physical Exam Vitals and nursing note reviewed.  Constitutional:      General: She is not in acute distress.    Appearance: Normal appearance. She is well-developed and well-groomed. She is obese. She is not ill-appearing, toxic-appearing or diaphoretic.  HENT:     Head: Normocephalic and atraumatic.     Jaw: There is normal jaw occlusion.     Right Ear: Hearing normal.     Left Ear: Hearing normal.     Nose: Nose normal.     Mouth/Throat:     Lips: Pink.     Mouth: Mucous membranes are moist.     Pharynx: Oropharynx is clear. Uvula midline.  Eyes:     General: Lids are normal.     Extraocular Movements: Extraocular movements intact.     Conjunctiva/sclera: Conjunctivae normal.     Pupils: Pupils are equal, round, and reactive to light.  Neck:     Thyroid: No thyroid mass, thyromegaly or thyroid tenderness.     Vascular: No carotid bruit or JVD.     Trachea: Trachea and phonation normal.  Cardiovascular:     Rate and Rhythm: Normal rate and regular rhythm.     Chest Wall: PMI is not displaced.     Pulses: Normal pulses.     Heart sounds: Normal heart sounds. No  murmur. No friction rub. No gallop.   Pulmonary:     Effort: Pulmonary effort is normal. No respiratory distress.     Breath sounds: Normal breath sounds. No wheezing.  Abdominal:  General: Bowel sounds are normal. There is no distension or abdominal bruit.     Palpations: Abdomen is soft. There is no hepatomegaly or splenomegaly.     Tenderness: There is no abdominal tenderness. There is no right CVA tenderness or left CVA tenderness.     Hernia: No hernia is present.  Musculoskeletal:     Cervical back: Normal, normal range of motion and neck supple. No tenderness. No pain with movement. Normal range of motion.     Thoracic back: Tenderness present. Decreased range of motion.     Lumbar back: Tenderness present. Decreased range of motion.     Right hip: Tenderness present. Decreased range of motion. Decreased strength.     Left hip: Tenderness present. Decreased range of motion. Decreased strength.     Right upper leg: No tenderness or bony tenderness.     Left upper leg: No tenderness or bony tenderness.     Right lower leg: Swelling present. 2+ Edema present.     Left lower leg: Swelling present. 2+ Edema present.     Comments: Kyphosis  Lymphadenopathy:     Cervical: No cervical adenopathy.  Skin:    General: Skin is warm and dry.     Capillary Refill: Capillary refill takes less than 2 seconds.     Coloration: Skin is not cyanotic, jaundiced or pale.     Findings: No rash.  Neurological:     General: No focal deficit present.     Mental Status: She is alert and oriented to person, place, and time.     Cranial Nerves: Cranial nerves are intact.     Sensory: Sensation is intact.     Motor: Motor function is intact.     Coordination: Coordination is intact.     Gait: Gait abnormal (antalgic and leftward lean, uses cane).     Deep Tendon Reflexes: Reflexes are normal and symmetric.  Psychiatric:        Attention and Perception: Attention and perception normal.        Mood  and Affect: Mood and affect normal.        Speech: Speech normal.        Behavior: Behavior normal. Behavior is cooperative.        Thought Content: Thought content normal.        Cognition and Memory: Cognition and memory normal.        Judgment: Judgment normal.     Results for orders placed or performed during the hospital encounter of 94/80/16  Basic metabolic panel  Result Value Ref Range   Sodium 141 135 - 145 mmol/L   Potassium 3.8 3.5 - 5.1 mmol/L   Chloride 106 98 - 111 mmol/L   CO2 26 22 - 32 mmol/L   Glucose, Bld 91 70 - 99 mg/dL   BUN 21 8 - 23 mg/dL   Creatinine, Ser 1.19 (H) 0.44 - 1.00 mg/dL   Calcium 9.3 8.9 - 10.3 mg/dL   GFR calc non Af Amer 41 (L) >60 mL/min   GFR calc Af Amer 47 (L) >60 mL/min   Anion gap 9 5 - 15  Glucose, capillary  Result Value Ref Range   Glucose-Capillary 125 (H) 70 - 99 mg/dL  Glucose, capillary  Result Value Ref Range   Glucose-Capillary 110 (H) 70 - 99 mg/dL       Pertinent labs & imaging results that were available during my care of the patient were reviewed by me and considered in my  medical decision making.  Assessment & Plan:  Arryana was seen today for establish care.  Diagnoses and all orders for this visit:  Type 2 diabetes mellitus without complication, without long-term current use of insulin (HCC) A1C 6.4.  Labs pending, will reinitiate medications if renal function normal.  Diet and exercise encouraged.  Follow-up in 3 months. -     CBC with Differential/Platelet -     CMP14+EGFR -     Lipid panel -     Thyroid Panel With TSH -     Bayer DCA Hb A1c Waived -     Microalbumin / creatinine urine ratio  Hypertension associated with diabetes (HCC) BP well controlled. Changes were not made in regimen today. Goal BP is 130/80. Pt aware to report any persistent high or low readings. DASH diet and exercise encouraged. Exercise at least 150 minutes per week and increase as tolerated. Goal BMI > 25. Stress management  encouraged. Avoid nicotine and tobacco product use. Avoid excessive alcohol and NSAID's. Avoid more than 2000 mg of sodium daily. Medications as prescribed. Follow up as scheduled.  -     CBC with Differential/Platelet -     CMP14+EGFR -     Lipid panel -     Thyroid Panel With TSH -     Microalbumin / creatinine urine ratio  Chronic bilateral low back pain with left-sided sciatica Ongoing pain. Will refer to neurosurgery. Burst of steroids today to see if beneficial.  -     Ambulatory referral to Neurosurgery -     methylPREDNISolone acetate (DEPO-MEDROL) injection 40 mg  Endometrial cancer ([place]) Followed by GYN and oncology on a regular basis. Pt goes every 6 months and will continue.   Gastroesophageal reflux disease without esophagitis No red flags present. Diet discussed. Avoid fried, spicy, fatty, greasy, and acidic foods. Avoid caffeine, nicotine, and alcohol. Do not eat 2-3 hours before bedtime and stay upright for at least 1-2 hours after eating. Eat small frequent meals. Avoid NSAID's like motrin and aleve. Medications as prescribed. Report any new or worsening symptoms. Follow up as discussed or sooner if needed.   -     CBC with Differential/Platelet  Vitamin D deficiency Labs pending. Continue repletion therapy. If indicated, will change repletion dosage. Eat foods rich in Vit D including milk, orange juice, yogurt with vitamin D added, salmon or mackerel, canned tuna fish, cereals with vitamin D added, and cod liver oil. Get out in the sun but make sure to wear at least SPF 30 sunscreen.  -     VITAMIN D 25 Hydroxy (Vit-D Deficiency, Fractures)   Total time spent with patient 65 mintues.  Greater than 50% of encounter spent in coordination of care/counseling.   Continue all other maintenance medications.  Follow up plan: Return in about 3 months (around 11/04/2019), or if symptoms worsen or fail to improve, for DM, HTN.  Continue healthy lifestyle choices, including diet  (rich in fruits, vegetables, and lean proteins, and low in salt and simple carbohydrates) and exercise (at least 30 minutes of moderate physical activity daily).  Educational handout given for chronic pain  The above assessment and management plan was discussed with the patient. The patient verbalized understanding of and has agreed to the management plan. Patient is aware to call the clinic if they develop any new symptoms or if symptoms persist or worsen. Patient is aware when to return to the clinic for a follow-up visit. Patient educated on when it is appropriate  to go to the emergency department.   Monia Pouch, FNP-C [facility] [phone]

## 2019-08-07 LAB — CMP14+EGFR
ALT: 14 IU/L (ref 0–32)
AST: 15 IU/L (ref 0–40)
Albumin/Globulin Ratio: 1.6 (ref 1.2–2.2)
Albumin: 3.9 g/dL (ref 3.6–4.6)
Alkaline Phosphatase: 62 IU/L (ref 39–117)
BUN/Creatinine Ratio: 18 (ref 12–28)
BUN: 19 mg/dL (ref 8–27)
Bilirubin Total: 0.3 mg/dL (ref 0.0–1.2)
CO2: 23 mmol/L (ref 20–29)
Calcium: 9.8 mg/dL (ref 8.7–10.3)
Chloride: 105 mmol/L (ref 96–106)
Creatinine, Ser: 1.07 mg/dL — ABNORMAL HIGH (ref 0.57–1.00)
GFR calc Af Amer: 55 mL/min/{1.73_m2} — ABNORMAL LOW (ref >59)
GFR calc non Af Amer: 47 mL/min/{1.73_m2} — ABNORMAL LOW (ref >59)
Globulin, Total: 2.5 g/dL (ref 1.5–4.5)
Glucose: 103 mg/dL — ABNORMAL HIGH (ref 65–99)
Potassium: 4.4 mmol/L (ref 3.5–5.2)
Sodium: 142 mmol/L (ref 134–144)
Total Protein: 6.4 g/dL (ref 6.0–8.5)

## 2019-08-07 LAB — CBC WITH DIFFERENTIAL/PLATELET
Basophils Absolute: 0.1 10*3/uL (ref 0.0–0.2)
Basos: 1 %
EOS (ABSOLUTE): 0.3 10*3/uL (ref 0.0–0.4)
Eos: 4 %
Hematocrit: 35.4 % (ref 34.0–46.6)
Hemoglobin: 11.6 g/dL (ref 11.1–15.9)
Immature Grans (Abs): 0 10*3/uL (ref 0.0–0.1)
Immature Granulocytes: 0 %
Lymphocytes Absolute: 1.7 10*3/uL (ref 0.7–3.1)
Lymphs: 20 %
MCH: 28.4 pg (ref 26.6–33.0)
MCHC: 32.8 g/dL (ref 31.5–35.7)
MCV: 87 fL (ref 79–97)
Monocytes Absolute: 0.7 10*3/uL (ref 0.1–0.9)
Monocytes: 8 %
Neutrophils Absolute: 5.7 10*3/uL (ref 1.4–7.0)
Neutrophils: 67 %
Platelets: 299 10*3/uL (ref 150–450)
RBC: 4.08 x10E6/uL (ref 3.77–5.28)
RDW: 13.1 % (ref 11.7–15.4)
WBC: 8.5 10*3/uL (ref 3.4–10.8)

## 2019-08-07 LAB — THYROID PANEL WITH TSH
Free Thyroxine Index: 2.3 (ref 1.2–4.9)
T3 Uptake Ratio: 26 % (ref 24–39)
T4, Total: 8.9 ug/dL (ref 4.5–12.0)
TSH: 3.25 u[IU]/mL (ref 0.450–4.500)

## 2019-08-07 LAB — VITAMIN D 25 HYDROXY (VIT D DEFICIENCY, FRACTURES): Vit D, 25-Hydroxy: 46.1 ng/mL (ref 30.0–100.0)

## 2019-08-07 LAB — LIPID PANEL
Chol/HDL Ratio: 3 ratio (ref 0.0–4.4)
Cholesterol, Total: 169 mg/dL (ref 100–199)
HDL: 56 mg/dL (ref >39)
LDL Chol Calc (NIH): 92 mg/dL (ref 0–99)
Triglycerides: 115 mg/dL (ref 0–149)
VLDL Cholesterol Cal: 21 mg/dL (ref 5–40)

## 2019-08-07 LAB — MICROALBUMIN / CREATININE URINE RATIO
Creatinine, Urine: 136.3 mg/dL
Microalb/Creat Ratio: 4 mg/g creat (ref 0–29)
Microalbumin, Urine: 5.8 ug/mL

## 2019-08-08 ENCOUNTER — Telehealth: Payer: Self-pay | Admitting: Family Medicine

## 2019-08-08 NOTE — Telephone Encounter (Signed)
Patient aware of results and verbalizes understanding.  

## 2019-09-20 ENCOUNTER — Ambulatory Visit (INDEPENDENT_AMBULATORY_CARE_PROVIDER_SITE_OTHER): Payer: Medicare Other | Admitting: Nurse Practitioner

## 2019-10-02 ENCOUNTER — Encounter: Payer: Self-pay | Admitting: *Deleted

## 2019-11-08 ENCOUNTER — Encounter: Payer: Self-pay | Admitting: Family Medicine

## 2019-11-08 ENCOUNTER — Ambulatory Visit (INDEPENDENT_AMBULATORY_CARE_PROVIDER_SITE_OTHER): Payer: Medicare Other | Admitting: Family Medicine

## 2019-11-08 ENCOUNTER — Ambulatory Visit: Payer: Medicare Other | Admitting: Family Medicine

## 2019-11-08 ENCOUNTER — Other Ambulatory Visit: Payer: Self-pay

## 2019-11-08 VITALS — BP 126/75 | HR 84 | Temp 98.1°F | Ht 63.0 in | Wt 190.8 lb

## 2019-11-08 DIAGNOSIS — R600 Localized edema: Secondary | ICD-10-CM

## 2019-11-08 DIAGNOSIS — E119 Type 2 diabetes mellitus without complications: Secondary | ICD-10-CM

## 2019-11-08 DIAGNOSIS — R252 Cramp and spasm: Secondary | ICD-10-CM | POA: Diagnosis not present

## 2019-11-08 DIAGNOSIS — G8929 Other chronic pain: Secondary | ICD-10-CM

## 2019-11-08 DIAGNOSIS — J302 Other seasonal allergic rhinitis: Secondary | ICD-10-CM | POA: Diagnosis not present

## 2019-11-08 DIAGNOSIS — R7303 Prediabetes: Secondary | ICD-10-CM

## 2019-11-08 DIAGNOSIS — M25562 Pain in left knee: Secondary | ICD-10-CM

## 2019-11-08 DIAGNOSIS — M25561 Pain in right knee: Secondary | ICD-10-CM

## 2019-11-08 DIAGNOSIS — Z862 Personal history of diseases of the blood and blood-forming organs and certain disorders involving the immune mechanism: Secondary | ICD-10-CM | POA: Diagnosis not present

## 2019-11-08 LAB — BAYER DCA HB A1C WAIVED: HB A1C (BAYER DCA - WAIVED): 6 % (ref <7.0)

## 2019-11-08 MED ORDER — LISINOPRIL-HYDROCHLOROTHIAZIDE 20-12.5 MG PO TABS: 1.0000 | ORAL_TABLET | Freq: Every day | ORAL | 1 refills | Status: DC

## 2019-11-08 MED ORDER — HYDROCHLOROTHIAZIDE 25 MG PO TABS: 12.5000 mg | ORAL_TABLET | Freq: Every day | ORAL | 1 refills | Status: DC

## 2019-11-08 MED ORDER — OMEPRAZOLE 20 MG PO CPDR: DELAYED_RELEASE_CAPSULE | ORAL | 1 refills | Status: DC

## 2019-11-08 NOTE — Progress Notes (Signed)
Assessment & Plan:  1. Prediabetes - Well controlled without medication. - Bayer DCA Hb A1c Waived - CMP14+EGFR  2. History of anemia - CBC with Differential/Platelet  3. Leg cramp - CBC with Differential/Platelet - Magnesium  4. Seasonal allergies - Encouraged to use Flonase in the a.m. and an antihistamine in the p.m.  5. Bilateral lower extremity edema - Encouraged elevating her legs when she is sitting or lying down and applying compression hose first thing in the morning and removing them in the evening.  6. Chronic pain of both knees - Ambulatory referral to Orthopedic Surgery   Return in about 3 months (around 02/07/2020) for follow-up of chronic medication conditions.  Hendricks Limes, MSN, APRN, FNP-C [facility]  Subjective:    Patient ID: Susan Haney, female    DOB: 10/22/1931, 86 y.o.   MRN: [id number]  Patient Care Team: Susan Brooklyn, FNP as PCP - General (Family Medicine)   Chief Complaint:  Chief Complaint  Patient presents with  . Establish Care    [facility] patient   . Diabetes    3 month follow up  . Allergies    Patient states that she has having bilateral ear pain that has been going on a few days.  Patient also states that she has been having headaches x 2 months.   . Leg Swelling    Patient states that she has been having bilateral leg swelling that has been ongoing but has gotten worse.    HPI: Susan Haney is a 86 y.o. female presenting on 11/08/2019 for Establish Care ([facility] patient ), Diabetes (3 month follow up), Allergies (Patient states that she has having bilateral ear pain that has been going on a few days.  Patient also states that she has been having headaches x 2 months. ), and Leg Swelling (Patient states that she has been having bilateral leg swelling that has been ongoing but has gotten worse.)  Diabetes: Patient presents for follow up of diabetes. Current symptoms include: none. Known diabetic  complications: none. Medication compliance: Patient does not take medication for diabetes. Current diet: in general, a "healthy" diet  . Is she  on ACE inhibitor or angiotensin II receptor blocker? Yes. Is she on a statin? No.   Lab Results  Component Value Date   HGBA1C 6.0 11/08/2019   HGBA1C 6.4 08/06/2019   HGBA1C 5.2 04/09/2016   Lab Results  Component Value Date   LDLCALC 92 08/06/2019   CREATININE 1.27 (H) 11/08/2019     New complaints: Allergies - patient reports she has been having headaches for the past 2 months since her allergies have been acting up.  She is also having bilateral ear pain that has been going on for the past few days.  Patient is concerned about lower extremity swelling that has been ongoing but she feels it is getting worse.  Patient reports she is always sitting with her legs down.  She does not like to elevate them.  There is no swelling in the morning when she wakes up, at all happens over the course of the day.  She does not wear compression hose.  She does follow a low-salt diet.  Patient reports occasional leg cramps.  Patient would like a second opinion on her knee pain.  She states that her orthopedic blames her knee pain on her back but she does not feel this is the case.  Social history:  Relevant past medical, surgical, family and social  history reviewed and updated as indicated. Interim medical history since our last visit reviewed.  Allergies and medications reviewed and updated.  DATA REVIEWED: CHART IN EPIC  ROS: Negative unless specifically indicated above in HPI.    Current Outpatient Medications:  .  aspirin EC 81 MG tablet, Take 81 mg by mouth daily., Disp: , Rfl:  .  Biotin 1000 MCG tablet, Take 1,000 mcg by mouth daily., Disp: , Rfl:  .  Blood Glucose Monitoring Suppl (ACCU-CHEK AVIVA PLUS) w/Device KIT, accu-chek    kit aviva pl, Disp: , Rfl:  .  cholecalciferol (VITAMIN D) 400 UNITS TABS tablet, Take 2,000 Units by mouth.,  Disp: , Rfl:  .  docusate sodium (COLACE) 100 MG capsule, Take 100 mg by mouth 2 (two) times daily., Disp: , Rfl:  .  hydrochlorothiazide (HYDRODIURIL) 25 MG tablet, Take 0.5 tablets (12.5 mg total) by mouth daily., Disp: 45 tablet, Rfl: 1 .  lisinopril-hydrochlorothiazide (ZESTORETIC) 20-12.5 MG tablet, Take 1 tablet by mouth daily., Disp: 90 tablet, Rfl: 1 .  lubiprostone (AMITIZA) 24 MCG capsule, Take 1 capsule (24 mcg total) by mouth 2 (two) times daily with a meal. As needed (Patient taking differently: Take 24 mcg by mouth as needed. 2 times per week), Disp: 60 capsule, Rfl: 1 .  Misc Natural Products (TART CHERRY ADVANCED) CAPS, Take by mouth., Disp: , Rfl:  .  Omega-3 Fatty Acids (OMEGA 3 500 PO), Take 4 tablets by mouth. , Disp: , Rfl:  .  omeprazole (PRILOSEC) 20 MG capsule, TAKE 1 CAPSULE (20 MG TOTAL) BY MOUTH DAILY., Disp: 90 capsule, Rfl: 1   No Known Allergies Past Medical History:  Diagnosis Date  . Anemia    history ,  took iron for years  . Arthritis   . Bicuspid aortic valve   . Cancer ([place]) 2017   endometrial cancer / cervical  . Cellulitis 12/31/2017   BLE, ankles  . Chronic back pain   . DDD (degenerative disc disease), lumbar   . Diabetes mellitus without complication ([place])   . Endometrial cancer ([place])   . GERD (gastroesophageal reflux disease)   . History of radiation therapy 06/15/16-07/06/16   vaginal cuff 30 Gy in 5 fractions  . Hypertension   . Spondylosis    lumbar    Past Surgical History:  Procedure Laterality Date  . BUNIONECTOMY    . CARPAL TUNNEL RELEASE Right 05/30/2018   Procedure: RIGHT CARPAL TUNNEL RELEASE;  Surgeon: Daryll Brod, MD;  Location: [facility];  Service: Orthopedics;  Laterality: Right;  . COLONOSCOPY N/A 04/04/2014   Procedure: COLONOSCOPY;  Surgeon: Rogene Houston, MD;  [facility];  Service: Endoscopy;  Laterality: N/A;  100  . EYE SURGERY     bil cataracts and lens implants  . FOOT SURGERY      Left  . Ovary removed: benign    . REPLACEMENT TOTAL KNEE BILATERAL     2013  . ROBOTIC ASSISTED TOTAL HYSTERECTOMY WITH BILATERAL SALPINGO OOPHERECTOMY Left 04/13/2016   Procedure: XI ROBOTIC ASSISTED TOTAL HYSTERECTOMY WITH LEFT SALPINGO OOPHORECTOMY SENTINEL LYMPH NODE BIOPSY;  Surgeon: Everitt Amber, MD;  Location: [facility];  Service: Gynecology;  Laterality: Left;  . TOTAL KNEE REVISION Left 02/12/2016   Procedure: LEFT TOTAL KNEE REVISION;  Surgeon: Rod Can, MD;  Location: [facility];  Service: Orthopedics;  Laterality: Left;    Social History   Socioeconomic History  . Marital status: Widowed    Spouse name: Not on file  .  Number of children: 2  . Years of education: Not on file  . Highest education level: Not on file  Occupational History  . Not on file  Tobacco Use  . Smoking status: Never Smoker  . Smokeless tobacco: Never Used  Substance and Sexual Activity  . Alcohol use: No    Alcohol/week: 0.0 standard drinks  . Drug use: No  . Sexual activity: Not Currently  Other Topics Concern  . Not on file  Social History Narrative  . Not on file   Social Determinants of Health   Financial Resource Strain:   . Difficulty of Paying Living Expenses:   Food Insecurity:   . Worried About Charity fundraiser in the Last Year:   . Arboriculturist in the Last Year:   Transportation Needs:   . Film/video editor (Medical):   Marland Kitchen Lack of Transportation (Non-Medical):   Physical Activity:   . Days of Exercise per Week:   . Minutes of Exercise per Session:   Stress:   . Feeling of Stress :   Social Connections:   . Frequency of Communication with Friends and Family:   . Frequency of Social Gatherings with Friends and Family:   . Attends Religious Services:   . Active Member of Clubs or Organizations:   . Attends Archivist Meetings:   Marland Kitchen Marital Status:   Intimate Partner Violence:   . Fear of Current or Ex-Partner:   . Emotionally Abused:   Marland Kitchen Physically Abused:    . Sexually Abused:         Objective:    BP 126/75   Pulse 84   Temp 98.1 F (36.7 C) (Temporal)   Ht '5\' 3"'  (1.6 m)   Wt 190 lb 12.8 oz (86.5 kg)   SpO2 95%   BMI 33.80 kg/m   Wt Readings from Last 3 Encounters:  11/08/19 190 lb 12.8 oz (86.5 kg)  08/06/19 191 lb (86.6 kg)  07/23/19 185 lb (83.9 kg)    Physical Exam Vitals reviewed.  Constitutional:      General: She is not in acute distress.    Appearance: Normal appearance. She is obese. She is not ill-appearing, toxic-appearing or diaphoretic.  HENT:     Head: Normocephalic and atraumatic.  Eyes:     General: No scleral icterus.       Right eye: No discharge.        Left eye: No discharge.     Conjunctiva/sclera: Conjunctivae normal.  Cardiovascular:     Rate and Rhythm: Normal rate and regular rhythm.     Heart sounds: Normal heart sounds. No murmur. No friction rub. No gallop.   Pulmonary:     Effort: Pulmonary effort is normal. No respiratory distress.     Breath sounds: Normal breath sounds. No stridor. No wheezing, rhonchi or rales.  Musculoskeletal:        General: Normal range of motion.     Cervical back: Normal range of motion.     Right lower leg: Edema present.     Left lower leg: Edema present.  Skin:    General: Skin is warm and dry.     Capillary Refill: Capillary refill takes less than 2 seconds.  Neurological:     General: No focal deficit present.     Mental Status: She is alert and oriented to person, place, and time. Mental status is at baseline.     Gait: Gait abnormal (ambulates with cane).  Psychiatric:        Mood and Affect: Mood normal.        Behavior: Behavior normal.        Thought Content: Thought content normal.        Judgment: Judgment normal.     Lab Results  Component Value Date   TSH 3.250 08/06/2019   Lab Results  Component Value Date   WBC 8.4 11/08/2019   HGB 11.7 11/08/2019   HCT 36.5 11/08/2019   MCV 88 11/08/2019   PLT 261 11/08/2019   Lab Results   Component Value Date   NA 143 11/08/2019   K 4.1 11/08/2019   CO2 22 11/08/2019   GLUCOSE 114 (H) 11/08/2019   BUN 28 (H) 11/08/2019   CREATININE 1.27 (H) 11/08/2019   BILITOT 0.3 11/08/2019   ALKPHOS 50 11/08/2019   AST 13 11/08/2019   ALT 10 11/08/2019   PROT 6.4 11/08/2019   ALBUMIN 4.1 11/08/2019   CALCIUM 9.8 11/08/2019   ANIONGAP 9 05/23/2018   Lab Results  Component Value Date   CHOL 169 08/06/2019   Lab Results  Component Value Date   HDL 56 08/06/2019   Lab Results  Component Value Date   LDLCALC 92 08/06/2019   Lab Results  Component Value Date   TRIG 115 08/06/2019   Lab Results  Component Value Date   CHOLHDL 3.0 08/06/2019   Lab Results  Component Value Date   HGBA1C 6.0 11/08/2019

## 2019-11-08 NOTE — Patient Instructions (Addendum)
Flonase in the AM, antihistamine (Clartin, Zytrec, Xyzal, or Allegra) in the PM.   Edema  Edema is when you have too much fluid in your body or under your skin. Edema may make your legs, feet, and ankles swell up. Swelling is also common in looser tissues, like around your eyes. This is a common condition. It gets more common as you get older. There are many possible causes of edema. Eating too much salt (sodium) and being on your feet or sitting for a long time can cause edema in your legs, feet, and ankles. Hot weather may make edema worse. Edema is usually painless. Your skin may look swollen or shiny. Follow these instructions at home:  Keep the swollen body part raised (elevated) above the level of your heart when you are sitting or lying down.  Do not sit still or stand for a long time.  Do not wear tight clothes. Do not wear garters on your upper legs.  Exercise your legs. This can help the swelling go down.  Wear elastic bandages or support stockings as told by your doctor.  Eat a low-salt (low-sodium) diet to reduce fluid as told by your doctor.  Depending on the cause of your swelling, you may need to limit how much fluid you drink (fluid restriction).  Take over-the-counter and prescription medicines only as told by your doctor. Contact a doctor if:  Treatment is not working.  You have heart, liver, or kidney disease and have symptoms of edema.  You have sudden and unexplained weight gain. Get help right away if:  You have shortness of breath or chest pain.  You cannot breathe when you lie down.  You have pain, redness, or warmth in the swollen areas.  You have heart, liver, or kidney disease and get edema all of a sudden.  You have a fever and your symptoms get worse all of a sudden. Summary  Edema is when you have too much fluid in your body or under your skin.  Edema may make your legs, feet, and ankles swell up. Swelling is also common in looser tissues,  like around your eyes.  Raise (elevate) the swollen body part above the level of your heart when you are sitting or lying down.  Follow your doctor's instructions about diet and how much fluid you can drink (fluid restriction). This information is not intended to replace advice given to you by your health care provider. Make sure you discuss any questions you have with your health care provider. Document Revised: 07/08/2017 Document Reviewed: 07/23/2016 Elsevier Patient Education  2020 [employer].

## 2019-11-09 ENCOUNTER — Other Ambulatory Visit: Payer: Self-pay | Admitting: Family Medicine

## 2019-11-09 DIAGNOSIS — N1832 Chronic kidney disease, stage 3b: Secondary | ICD-10-CM

## 2019-11-09 LAB — MAGNESIUM: Magnesium: 1.8 mg/dL (ref 1.6–2.3)

## 2019-11-09 LAB — CBC WITH DIFFERENTIAL/PLATELET
Basophils Absolute: 0 10*3/uL (ref 0.0–0.2)
Basos: 1 %
EOS (ABSOLUTE): 0.3 10*3/uL (ref 0.0–0.4)
Eos: 4 %
Hematocrit: 36.5 % (ref 34.0–46.6)
Hemoglobin: 11.7 g/dL (ref 11.1–15.9)
Immature Grans (Abs): 0 10*3/uL (ref 0.0–0.1)
Immature Granulocytes: 0 %
Lymphocytes Absolute: 1.9 10*3/uL (ref 0.7–3.1)
Lymphs: 23 %
MCH: 28.2 pg (ref 26.6–33.0)
MCHC: 32.1 g/dL (ref 31.5–35.7)
MCV: 88 fL (ref 79–97)
Monocytes Absolute: 0.7 10*3/uL (ref 0.1–0.9)
Monocytes: 8 %
Neutrophils Absolute: 5.5 10*3/uL (ref 1.4–7.0)
Neutrophils: 64 %
Platelets: 261 10*3/uL (ref 150–450)
RBC: 4.15 x10E6/uL (ref 3.77–5.28)
RDW: 13.8 % (ref 11.7–15.4)
WBC: 8.4 10*3/uL (ref 3.4–10.8)

## 2019-11-09 LAB — CMP14+EGFR
ALT: 10 IU/L (ref 0–32)
AST: 13 IU/L (ref 0–40)
Albumin/Globulin Ratio: 1.8 (ref 1.2–2.2)
Albumin: 4.1 g/dL (ref 3.6–4.6)
Alkaline Phosphatase: 50 IU/L (ref 39–117)
BUN/Creatinine Ratio: 22 (ref 12–28)
BUN: 28 mg/dL — ABNORMAL HIGH (ref 8–27)
Bilirubin Total: 0.3 mg/dL (ref 0.0–1.2)
CO2: 22 mmol/L (ref 20–29)
Calcium: 9.8 mg/dL (ref 8.7–10.3)
Chloride: 105 mmol/L (ref 96–106)
Creatinine, Ser: 1.27 mg/dL — ABNORMAL HIGH (ref 0.57–1.00)
GFR calc Af Amer: 44 mL/min/{1.73_m2} — ABNORMAL LOW (ref >59)
GFR calc non Af Amer: 38 mL/min/{1.73_m2} — ABNORMAL LOW (ref >59)
Globulin, Total: 2.3 g/dL (ref 1.5–4.5)
Glucose: 114 mg/dL — ABNORMAL HIGH (ref 65–99)
Potassium: 4.1 mmol/L (ref 3.5–5.2)
Sodium: 143 mmol/L (ref 134–144)
Total Protein: 6.4 g/dL (ref 6.0–8.5)

## 2019-11-13 NOTE — Progress Notes (Signed)
PNA not in epic.

## 2019-11-16 ENCOUNTER — Other Ambulatory Visit: Payer: Self-pay | Admitting: Family Medicine

## 2019-11-16 DIAGNOSIS — G8929 Other chronic pain: Secondary | ICD-10-CM

## 2019-11-16 NOTE — Progress Notes (Signed)
New referral placed to ortho as previous referral was closed out.

## 2019-11-26 ENCOUNTER — Ambulatory Visit (INDEPENDENT_AMBULATORY_CARE_PROVIDER_SITE_OTHER): Payer: Medicare Other | Admitting: Internal Medicine

## 2019-11-26 ENCOUNTER — Encounter (INDEPENDENT_AMBULATORY_CARE_PROVIDER_SITE_OTHER): Payer: Self-pay | Admitting: Internal Medicine

## 2019-11-26 ENCOUNTER — Other Ambulatory Visit: Payer: Self-pay

## 2019-11-26 VITALS — BP 164/72 | HR 66 | Temp 97.2°F | Ht 63.0 in | Wt 192.2 lb

## 2019-11-26 DIAGNOSIS — K5904 Chronic idiopathic constipation: Secondary | ICD-10-CM | POA: Diagnosis not present

## 2019-11-26 MED ORDER — TRULANCE 3 MG PO TABS: 3.0000 mg | ORAL_TABLET | Freq: Every day | ORAL | 0 refills | Status: DC

## 2019-11-26 NOTE — Patient Instructions (Signed)
Take Trulance/plecanatide milligrams by mouth every morning. Stool diary as to frequency and consistency of stools for the next 4 weeks and call office with progress report.

## 2019-11-26 NOTE — Progress Notes (Signed)
Presenting complaint;  Follow-up for constipation.  Database and subjective:  Patient is 86 year old Caucasian female who was several year history of constipation who his last colonoscopy was normal in in August 2015 except single diverticulum who is here for scheduled visit.  She was last seen in March 2020. She says she is not doing well.  She is on Amitiza/lubiprostone 24 mcg daily but she does not take it daily.  She says if she does not take this medication her bowels do not move for 3 to 4 days and when she does it is hard stool and difficult to have a bowel movement and when she does take Amitiza/lubiprostone she will have formed stool within an hour and then she has explosive bowel movement and passes large amount of loose stool for 2 to 3 hours.  She feels it makes her dehydrated.  She wonders what else she can take.  She also complains of bloating.  She denies melena or rectal bleeding anorexia or weight loss.  She has gained 5 pounds since her last visit.  She says she is not able to do much physical activity on of her back problem as well as left knee pain. She had tried Linzess which caused explosive diarrhea.  This was given to her by Dr. Melina Copa several years ago but she does not remember the dose.  She has tried lactulose as well as polyethylene glycol. She says she is trying to eat high-fiber diet.  She eats raisin Bran at breakfast on most days. She also complains of chronic low back pain.  She has scoliosis.  She also complains of pain in front of her left thigh and left knee.  She says she had hinged knee resulting from a fall.  She had this knee replaced several years ago.  Current Medications: Outpatient Encounter Medications as of 11/26/2019  Medication Sig  . aspirin EC 81 MG tablet Take 81 mg by mouth daily.  . Biotin 1000 MCG tablet Take 1,000 mcg by mouth daily.  . Blood Glucose Monitoring Suppl (ACCU-CHEK AVIVA PLUS) w/Device KIT accu-chek    kit aviva pl  .  cholecalciferol (VITAMIN D) 400 UNITS TABS tablet Take 2,000 Units by mouth.  . docusate sodium (COLACE) 100 MG capsule Take 100 mg by mouth 2 (two) times daily.  . hydrochlorothiazide (HYDRODIURIL) 25 MG tablet Take 0.5 tablets (12.5 mg total) by mouth daily.  Marland Kitchen lisinopril-hydrochlorothiazide (ZESTORETIC) 20-12.5 MG tablet Take 1 tablet by mouth daily.  Marland Kitchen lubiprostone (AMITIZA) 24 MCG capsule Take 1 capsule (24 mcg total) by mouth 2 (two) times daily with a meal. As needed (Patient taking differently: Take 24 mcg by mouth as needed. 2 times per week)  . Misc Natural Products (TART CHERRY ADVANCED) CAPS Take by mouth daily.   . Omega-3 Fatty Acids (OMEGA 3 500 PO) Take 1 tablet by mouth daily.   Marland Kitchen omeprazole (PRILOSEC) 20 MG capsule TAKE 1 CAPSULE (20 MG TOTAL) BY MOUTH DAILY.   No facility-administered encounter medications on file as of 11/26/2019.     Objective: Blood pressure (!) 164/72, pulse 66, temperature (!) 97.2 F (36.2 C), temperature source Temporal, height 5' 3" (1.6 m), weight 192 lb 3.2 oz (87.2 kg). Patient is alert and in no acute distress. She is wearing a facial mask. She ambulates using a cane. Conjunctiva is pink. Sclera is nonicteric Oropharyngeal mucosa is normal. No neck masses or thyromegaly noted. Cardiac exam with regular rhythm normal S1 and S2. No murmur or gallop noted.  Lungs are clear to auscultation. Abdomen is full.  Bowel sounds are normal.  On palpation abdomen is soft.  Mild tenderness noted in left midabdomen on deep palpation but no organomegaly or masses. She has 1+ pitting edema involving both legs.  Labs/studies Results:  CBC Latest Ref Rng & Units 11/08/2019 08/06/2019 04/14/2016  WBC 3.4 - 10.8 x10E3/uL 8.4 8.5 13.2(H)  Hemoglobin 11.1 - 15.9 g/dL 11.7 11.6 9.4(L)  Hematocrit 34.0 - 46.6 % 36.5 35.4 29.8(L)  Platelets 150 - 450 x10E3/uL 261 299 288    CMP Latest Ref Rng & Units 11/08/2019 08/06/2019 05/23/2018  Glucose 65 - 99 mg/dL 114(H)  103(H) 91  BUN 8 - 27 mg/dL 28(H) 19 21  Creatinine 0.57 - 1.00 mg/dL 1.27(H) 1.07(H) 1.19(H)  Sodium 134 - 144 mmol/L 143 142 141  Potassium 3.5 - 5.2 mmol/L 4.1 4.4 3.8  Chloride 96 - 106 mmol/L 105 105 106  CO2 20 - 29 mmol/L _0 Calcium 8.7 - 10.3 mg/dL 9.8 9.8 9.3  Total Protein 6.0 - 8.5 g/dL 6.4 6.4 -  Total Bilirubin 0.0 - 1.2 mg/dL 0.3 0.3 -  Alkaline Phos 39 - 117 IU/L 50 62 -  AST 0 - 40 IU/L 13 15 -  ALT 0 - 32 IU/L 10 14 -    Hepatic Function Latest Ref Rng & Units 11/08/2019 08/06/2019 04/09/2016  Total Protein 6.0 - 8.5 g/dL 6.4 6.4 7.3  Albumin 3.6 - 4.6 g/dL 4.1 3.9 4.1  AST 0 - 40 IU/L 13 15 14(L)  ALT 0 - 32 IU/L 10 14 9(L)  Alk Phosphatase 39 - 117 IU/L 50 62 40  Total Bilirubin 0.0 - 1.2 mg/dL 0.3 0.3 0.6    Lab data reviewed.  Recent CBC normal. Serum calcium also normal.  Assessment:  #1.  Chronic constipation.  She has had constipation for several years.  She has been on various medications.  Amitiza/lubiprostone at a dose of 24 mcg appears to be too much for her.  Option would be to try another agent or use Amitiza/lubiprostone at a low dose.  Patient is inclined to try another medication before she would go back to lower dose of Amitiza.  Plan:  Increase dietary fiber as possible.  She may consider eating an apple every day and may be prunes or drink prune juice. Trulance/plecanatide 3 mg by mouth every morning. Patient was given 30-day supply of samples. Patient advised to keep stool diary and call with progress report in 4 weeks. If this medication works we will send a prescription to her pharmacy and if does not we will try Amitiza at a dose of 8 to 16 mcg daily. Office visit in 1 year.

## 2019-12-05 ENCOUNTER — Telehealth (INDEPENDENT_AMBULATORY_CARE_PROVIDER_SITE_OTHER): Payer: Self-pay | Admitting: *Deleted

## 2019-12-05 NOTE — Telephone Encounter (Signed)
Patient left a message that she had taken all the medication and even had doubled up on it and still not doing any good. She ask what is she to do , go back on her old medication.  Per Dr.Rehman the patient may take 16 mcg of the Amitzia.  If this does not work she is to take [employer]  2 by mouth daily.  Patient called and made aware.  Because she had not heard from me by 11 am she took her only Amitiza 24 mcg. She says that she has been in the bathroom about all day.  She is requesting that I call in the 8 mcg and also some Peri Colace. This has been called to the [facility] in [place]

## 2019-12-20 ENCOUNTER — Other Ambulatory Visit: Payer: Self-pay

## 2019-12-20 ENCOUNTER — Other Ambulatory Visit: Payer: Medicare Other

## 2019-12-20 DIAGNOSIS — N1832 Chronic kidney disease, stage 3b: Secondary | ICD-10-CM

## 2019-12-20 LAB — BMP8+EGFR
BUN/Creatinine Ratio: 16 (ref 12–28)
BUN: 17 mg/dL (ref 8–27)
CO2: 22 mmol/L (ref 20–29)
Calcium: 9.3 mg/dL (ref 8.7–10.3)
Chloride: 104 mmol/L (ref 96–106)
Creatinine, Ser: 1.09 mg/dL — ABNORMAL HIGH (ref 0.57–1.00)
GFR calc Af Amer: 53 mL/min/{1.73_m2} — ABNORMAL LOW (ref >59)
GFR calc non Af Amer: 46 mL/min/{1.73_m2} — ABNORMAL LOW (ref >59)
Glucose: 108 mg/dL — ABNORMAL HIGH (ref 65–99)
Potassium: 4 mmol/L (ref 3.5–5.2)
Sodium: 141 mmol/L (ref 134–144)

## 2019-12-25 ENCOUNTER — Ambulatory Visit (INDEPENDENT_AMBULATORY_CARE_PROVIDER_SITE_OTHER): Payer: Medicare Other | Admitting: Family Medicine

## 2019-12-25 ENCOUNTER — Encounter: Payer: Self-pay | Admitting: Family Medicine

## 2019-12-25 DIAGNOSIS — J302 Other seasonal allergic rhinitis: Secondary | ICD-10-CM

## 2019-12-25 DIAGNOSIS — J019 Acute sinusitis, unspecified: Secondary | ICD-10-CM

## 2019-12-25 MED ORDER — AMOXICILLIN-POT CLAVULANATE 875-125 MG PO TABS: 1.0000 | ORAL_TABLET | Freq: Two times a day (BID) | ORAL | 0 refills | Status: AC

## 2019-12-25 NOTE — Progress Notes (Signed)
Virtual Visit via Telephone Note  I connected with Susan Haney on 12/25/19 at 10:16 AM by telephone and verified that I am speaking with the correct person using two identifiers. Susan Haney is currently located at home and nobody is currently with her during this visit. The provider, Loman Brooklyn, FNP is located in their home at time of visit.  I discussed the limitations, risks, security and privacy concerns of performing an evaluation and management service by telephone and the availability of in person appointments. I also discussed with the patient that there may be a patient responsible charge related to this service. The patient expressed understanding and agreed to proceed.  Subjective: PCP: Loman Brooklyn, FNP  Chief Complaint  Patient presents with  . URI   Patient complains of cough, chest congestion, headache, ear pain/pressure, facial pain/pressure, postnasal drainage and dizziness.  Onset of symptoms was a few weeks ago, gradually worsening since that time. She is drinking plenty of fluids. Evaluation to date: none. Treatment to date: none. She does not smoke.    ROS: Per HPI  Current Outpatient Medications:  .  aspirin EC 81 MG tablet, Take 81 mg by mouth daily., Disp: , Rfl:  .  Biotin 1000 MCG tablet, Take 1,000 mcg by mouth daily., Disp: , Rfl:  .  cholecalciferol (VITAMIN D) 400 UNITS TABS tablet, Take 2,000 Units by mouth., Disp: , Rfl:  .  docusate sodium (COLACE) 100 MG capsule, Take 100 mg by mouth 2 (two) times daily., Disp: , Rfl:  .  hydrochlorothiazide (HYDRODIURIL) 25 MG tablet, Take 0.5 tablets (12.5 mg total) by mouth daily., Disp: 45 tablet, Rfl: 1 .  lisinopril-hydrochlorothiazide (ZESTORETIC) 20-12.5 MG tablet, Take 1 tablet by mouth daily., Disp: 90 tablet, Rfl: 1 .  Misc Natural Products (TART CHERRY ADVANCED) CAPS, Take by mouth daily. , Disp: , Rfl:  .  Omega-3 Fatty Acids (OMEGA 3 500 PO), Take 1 tablet by mouth daily. , Disp: , Rfl:   .  omeprazole (PRILOSEC) 20 MG capsule, TAKE 1 CAPSULE (20 MG TOTAL) BY MOUTH DAILY., Disp: 90 capsule, Rfl: 1 .  Plecanatide (TRULANCE) 3 MG TABS, Take 3 mg by mouth daily., Disp: 30 tablet, Rfl: 0  No Known Allergies Past Medical History:  Diagnosis Date  . Anemia    history ,  took iron for years  . Arthritis   . Bicuspid aortic valve   . Cancer ([place]) 2017   endometrial cancer / cervical  . Cellulitis 12/31/2017   BLE, ankles  . Chronic back pain   . DDD (degenerative disc disease), lumbar   . Diabetes mellitus without complication ([place])   . Endometrial cancer ([place])   . GERD (gastroesophageal reflux disease)   . History of radiation therapy 06/15/16-07/06/16   vaginal cuff 30 Gy in 5 fractions  . Hypertension   . Spondylosis    lumbar    Observations/Objective: A&O  No respiratory distress or wheezing audible over the phone Mood, judgement, and thought processes all WNL  Assessment and Plan: 1. Acute non-recurrent sinusitis, unspecified location - Discussed symptom management.  - amoxicillin-clavulanate (AUGMENTIN) 875-125 MG tablet; Take 1 tablet by mouth 2 (two) times daily for 7 days.  Dispense: 14 tablet; Refill: 0  2. Seasonal allergies - Encouraged to start Flonase and antihistamine (Claritin, Zyrtec, Allegra, or Xyzal) QD.    Follow Up Instructions:  I discussed the assessment and treatment plan with the patient. The patient was provided an opportunity to ask  questions and all were answered. The patient agreed with the plan and demonstrated an understanding of the instructions.   The patient was advised to call back or seek an in-person evaluation if the symptoms worsen or if the condition fails to improve as anticipated.  The above assessment and management plan was discussed with the patient. The patient verbalized understanding of and has agreed to the management plan. Patient is aware to call the clinic if symptoms persist or worsen. Patient is aware when  to return to the clinic for a follow-up visit. Patient educated on when it is appropriate to go to the emergency department.   Time call ended: 10:42 AM  I provided 26 minutes of non-face-to-face time during this encounter.  Hendricks Limes, MSN, APRN, FNP-C [facility] 12/25/19

## 2019-12-28 ENCOUNTER — Other Ambulatory Visit: Payer: Self-pay

## 2019-12-28 ENCOUNTER — Encounter: Payer: Self-pay | Admitting: Gynecologic Oncology

## 2019-12-28 ENCOUNTER — Inpatient Hospital Stay: Payer: Medicare Other | Attending: Gynecologic Oncology | Admitting: Gynecologic Oncology

## 2019-12-28 VITALS — BP 163/55 | HR 80 | Temp 98.4°F | Resp 18 | Ht 63.0 in | Wt 192.5 lb

## 2019-12-28 DIAGNOSIS — C541 Malignant neoplasm of endometrium: Secondary | ICD-10-CM

## 2019-12-28 DIAGNOSIS — Z923 Personal history of irradiation: Secondary | ICD-10-CM | POA: Insufficient documentation

## 2019-12-28 DIAGNOSIS — Z79899 Other long term (current) drug therapy: Secondary | ICD-10-CM | POA: Diagnosis not present

## 2019-12-28 DIAGNOSIS — Q231 Congenital insufficiency of aortic valve: Secondary | ICD-10-CM | POA: Insufficient documentation

## 2019-12-28 DIAGNOSIS — E1136 Type 2 diabetes mellitus with diabetic cataract: Secondary | ICD-10-CM | POA: Insufficient documentation

## 2019-12-28 DIAGNOSIS — M810 Age-related osteoporosis without current pathological fracture: Secondary | ICD-10-CM | POA: Diagnosis not present

## 2019-12-28 DIAGNOSIS — Z08 Encounter for follow-up examination after completed treatment for malignant neoplasm: Secondary | ICD-10-CM | POA: Insufficient documentation

## 2019-12-28 DIAGNOSIS — Z90710 Acquired absence of both cervix and uterus: Secondary | ICD-10-CM | POA: Insufficient documentation

## 2019-12-28 DIAGNOSIS — Z90722 Acquired absence of ovaries, bilateral: Secondary | ICD-10-CM | POA: Diagnosis not present

## 2019-12-28 DIAGNOSIS — E669 Obesity, unspecified: Secondary | ICD-10-CM | POA: Diagnosis not present

## 2019-12-28 DIAGNOSIS — Z7982 Long term (current) use of aspirin: Secondary | ICD-10-CM | POA: Insufficient documentation

## 2019-12-28 DIAGNOSIS — M5136 Other intervertebral disc degeneration, lumbar region: Secondary | ICD-10-CM | POA: Diagnosis not present

## 2019-12-28 DIAGNOSIS — G8929 Other chronic pain: Secondary | ICD-10-CM | POA: Diagnosis not present

## 2019-12-28 DIAGNOSIS — Z8542 Personal history of malignant neoplasm of other parts of uterus: Secondary | ICD-10-CM | POA: Diagnosis not present

## 2019-12-28 DIAGNOSIS — I10 Essential (primary) hypertension: Secondary | ICD-10-CM | POA: Diagnosis not present

## 2019-12-28 DIAGNOSIS — K219 Gastro-esophageal reflux disease without esophagitis: Secondary | ICD-10-CM | POA: Insufficient documentation

## 2019-12-28 NOTE — Progress Notes (Signed)
Follow-up Note: Endometrial cancer   Susan Haney 86 y.o. female  Chief Complaint  Patient presents with  . Endometrial cancer ([place])    Follow up  history of endometrial cancer  Assessment : 86 year old woman with stage IB grade 2 endometrioid endometrial adenocarcinoma with high/intermediate risk factors and microsatellite instability on routine tumor testing, Lynch syndrome negative. s/p vaginal brachytherapy (completed December, 2017). No evidence for recurrence.   Plan:  I recommend she follow-up at 3 monthly intervals for symptom review, physical examination and pelvic examination. Pap smear is not recommended in routine endometrial cancer surveillance. We will continue these visits every 6 months, until 5 years.  HPI: The patient had onset of postmenopausal bleeding approximately a month ago. This was not  associated with any pain or other symptoms. An endometrial biopsy was obtained showing a grade 1-2 endometrial adenocarcinoma. Pelvic ultrasound showed the uterus to measure 7 x 4.4 x 5.6 cm with 2 small fibroids. The left ovary surgically absent right ovary appeared normal there is no free fluid.  Today patient is having some vaginal bleeding. She also notes some increased edema and pain in her left lower extremity below the knee.  The patient has a past history of a benign left ovarian cyst which was removed laparoscopically. The patient underwent menopause at age 21. Pap smears are normal  Obstetrical history gravida 2 para 2  Comorbidities include diabetes valvular heart disease osteoporosis lumbar disc disease and obesity.  Her original surgery date had to be postponed when she acutely fell and fractured her left distal femur assoicated with a knee replacement on 02/12/16. She required emergent repair and replacement and had been in rehab since that time.   On 04/13/16 we performed robotic assisted total hysterectomy, BSO, bilateral SLN biopsy. Final pathology revealed a  4.3cm FIGO grade 2 tumor with 10 of 53m myometrial invasion, negative LVSI and cervical stromal involvement. Bilateral SLN's were negative. 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 Genetic testing negative.  She went on to receive vaginal brachytherapy in accordance with NCCN guidelines: 06/15/16-07/06/16 30 Gy in 5 fractions to the vaginal cuff.  Interval Hx: She has no vaginal bleeding or symptoms concerning for recurrence. She predominantly reported knee and back pain issues    Review of Systems:10 point review of systems is negative except as noted in interval history. + knee and back pain, + peripheral edema.   Vitals: Blood pressure (!) 163/55, pulse 80, temperature 98.4 F (36.9 C), temperature source Oral, resp. rate 18, height '5\' 3"'  (1.6 m), weight 192 lb 8 oz (87.3 kg), SpO2 100 %.  Physical Exam: General : The patient is a healthy woman in no acute distress.  HEENT: normocephalic, extraoccular movements normal; neck is supple without thyromegally  Lynphnodes: Supraclavicular and inguinal nodes not enlarged  Abdomen: Soft, non-tender, no ascites, no organomegally, no masses, no hernias. Incisions well healed.  Pelvic:  EGBUS: Normal female (atrophic) Vagina: Normal, atrophic, no lesions. Cuff smooth and in tact with no blood. Urethra and Bladder: Normal, non-tender  Cervix & uterus surgically absent. Bi-manual examination: cuff in tact. No lesions. + vaginal prolapse/relaxation consistent with rectocele Rectal: normal sphincter tone, no masses, no blood  Lower extremities: incision healing normally on left leg/knee with no signs of infection. Can flex to 110 degrees. 2+ edema bilaterally     No Known Allergies  Past Medical History:  Diagnosis Date  . Anemia    history ,  took iron for  years  . Arthritis   . Bicuspid aortic valve   . Cancer ([place]) 2017   endometrial cancer / cervical  . Cellulitis 12/31/2017   BLE, ankles  . Chronic back  pain   . DDD (degenerative disc disease), lumbar   . Diabetes mellitus without complication ([place])   . Endometrial cancer ([place])   . GERD (gastroesophageal reflux disease)   . History of radiation therapy 06/15/16-07/06/16   vaginal cuff 30 Gy in 5 fractions  . Hypertension   . Spondylosis    lumbar    Past Surgical History:  Procedure Laterality Date  . BUNIONECTOMY    . CARPAL TUNNEL RELEASE Right 05/30/2018   Procedure: RIGHT CARPAL TUNNEL RELEASE;  Surgeon: Daryll Brod, MD;  Location: [facility];  Service: Orthopedics;  Laterality: Right;  . COLONOSCOPY N/A 04/04/2014   Procedure: COLONOSCOPY;  Surgeon: Rogene Houston, MD;  [facility];  Service: Endoscopy;  Laterality: N/A;  100  . EYE SURGERY     bil cataracts and lens implants  . FOOT SURGERY     Left  . Ovary removed: benign    . REPLACEMENT TOTAL KNEE BILATERAL     2013  . ROBOTIC ASSISTED TOTAL HYSTERECTOMY WITH BILATERAL SALPINGO OOPHERECTOMY Left 04/13/2016   Procedure: XI ROBOTIC ASSISTED TOTAL HYSTERECTOMY WITH LEFT SALPINGO OOPHORECTOMY SENTINEL LYMPH NODE BIOPSY;  Surgeon: Everitt Amber, MD;  [facility];  Service: Gynecology;  Laterality: Left;  . TOTAL KNEE REVISION Left 02/12/2016   Procedure: LEFT TOTAL KNEE REVISION;  Surgeon: Rod Can, MD;  [facility];  Service: Orthopedics;  Laterality: Left;    Current Outpatient Medications  Medication Sig Dispense Refill  . amoxicillin-clavulanate (AUGMENTIN) 875-125 MG tablet Take 1 tablet by mouth 2 (two) times daily for 7 days. 14 tablet 0  . aspirin EC 81 MG tablet Take 81 mg by mouth daily.    . Biotin 1000 MCG tablet Take 1,000 mcg by mouth daily.    . cholecalciferol (VITAMIN D) 400 UNITS TABS tablet Take 2,000 Units by mouth.    . docusate sodium (COLACE) 100 MG capsule Take 100 mg by mouth 2 (two) times daily.    . hydrochlorothiazide (HYDRODIURIL) 25 MG tablet Take 0.5 tablets (12.5 mg total) by mouth daily. 45 tablet 1   . lisinopril-hydrochlorothiazide (ZESTORETIC) 20-12.5 MG tablet Take 1 tablet by mouth daily. 90 tablet 1  . lubiprostone (AMITIZA) 8 MCG capsule     . Misc Natural Products (TART CHERRY ADVANCED) CAPS Take by mouth daily.     . Omega-3 Fatty Acids (OMEGA 3 500 PO) Take 1 tablet by mouth daily.     Marland Kitchen omeprazole (PRILOSEC) 20 MG capsule TAKE 1 CAPSULE (20 MG TOTAL) BY MOUTH DAILY. 90 capsule 1  . Plecanatide (TRULANCE) 3 MG TABS Take 3 mg by mouth daily. 30 tablet 0   No current facility-administered medications for this visit.    Social History   Socioeconomic History  . Marital status: Widowed    Spouse name: Not on file  . Number of children: 2  . Years of education: Not on file  . Highest education level: Not on file  Occupational History  . Not on file  Tobacco Use  . Smoking status: Never Smoker  . Smokeless tobacco: Never Used  Vaping Use  . Vaping Use: Never used  Substance and Sexual Activity  . Alcohol use: No    Alcohol/week: 0.0 standard drinks  . Drug use: No  .  Sexual activity: Not Currently  Other Topics Concern  . Not on file  Social History Narrative  . Not on file   Social Determinants of Health   Financial Resource Strain:   . Difficulty of Paying Living Expenses:   Food Insecurity:   . Worried About Charity fundraiser in the Last Year:   . Arboriculturist in the Last Year:   Transportation Needs:   . Film/video editor (Medical):   Marland Kitchen Lack of Transportation (Non-Medical):   Physical Activity:   . Days of Exercise per Week:   . Minutes of Exercise per Session:   Stress:   . Feeling of Stress :   Social Connections:   . Frequency of Communication with Friends and Family:   . Frequency of Social Gatherings with Friends and Family:   . Attends Religious Services:   . Active Member of Clubs or Organizations:   . Attends Archivist Meetings:   Marland Kitchen Marital Status:   Intimate Partner Violence:   . Fear of Current or Ex-Partner:   .  Emotionally Abused:   Marland Kitchen Physically Abused:   . Sexually Abused:     Family History  Problem Relation Age of Onset  . Prostate cancer Father 76  . Arthritis Brother        back issues and knee   . Diabetes Maternal Grandmother   . Heart attack Maternal Grandfather   . Liver cancer Sister   . Cancer Other 42       possibly pancreatic  . Cancer Other        possibly uterine or ovarian  . Heart disease Son   . Diabetes Son   . Arthritis Son   . Hypertension Son   . Hyperlipidemia Son     Thereasa Solo, MD 12/28/2019, 12:55 PM  CC: Dr Octavio Graves

## 2019-12-28 NOTE — Patient Instructions (Signed)
Please notify Dr Denman George at phone number [phone] if you notice vaginal bleeding, new pelvic or abdominal pains, bloating, feeling full easy, or a change in bladder or bowel function.   Please have Dr Clabe Seal office contact Dr Serita Grit office (at [phone]) in January after your appointment with him to request an appointment with her for July, 2022.

## 2020-01-23 ENCOUNTER — Ambulatory Visit (INDEPENDENT_AMBULATORY_CARE_PROVIDER_SITE_OTHER): Payer: Medicare Other | Admitting: Physician Assistant

## 2020-01-23 ENCOUNTER — Encounter: Payer: Self-pay | Admitting: Physician Assistant

## 2020-01-23 ENCOUNTER — Other Ambulatory Visit: Payer: Self-pay

## 2020-01-23 VITALS — BP 118/76 | HR 84 | Temp 98.3°F | Resp 20 | Ht 63.0 in | Wt 194.0 lb

## 2020-01-23 DIAGNOSIS — R600 Localized edema: Secondary | ICD-10-CM

## 2020-01-23 NOTE — Progress Notes (Signed)
  Subjective:     Patient ID: Susan Haney, female   DOB: 04-09-1932, 86 y.o.   MRN: [id number]  HPI Lower ext edema bilat Hx of same prev Denies any drainage from the legs Sl pain Is trying to remain  Review of Systems     Objective:   Physical Exam Vitals and nursing note reviewed.  Constitutional:      General: She is not in acute distress.    Appearance: Normal appearance. She is not toxic-appearing.  Musculoskeletal:        General: Swelling and tenderness present.     Right lower leg: Edema present.     Left lower leg: Edema present.     Comments: 1+ pitting edema bilat Sl erythema to distal ext No edema to the feet No skin breakdown No post calf TTP  Neurological:     Mental Status: She is alert.        Assessment:     1. Bilateral lower extremity edema         Plan:     Pt has not worn her compression stockings due to trouble getting them on Trial of OTC compression hose Also would like for her to get in contact with [facility] about assistive devices for the hose Activities as tol Continue with nightly foot/leg checks F/U prn

## 2020-01-23 NOTE — Patient Instructions (Signed)
Edema  Edema is when you have too much fluid in your body or under your skin. Edema may make your legs, feet, and ankles swell up. Swelling is also common in looser tissues, like around your eyes. This is a common condition. It gets more common as you get older. There are many possible causes of edema. Eating too much salt (sodium) and being on your feet or sitting for a long time can cause edema in your legs, feet, and ankles. Hot weather may make edema worse. Edema is usually painless. Your skin may look swollen or shiny. Follow these instructions at home:  Keep the swollen body part raised (elevated) above the level of your heart when you are sitting or lying down.  Do not sit still or stand for a long time.  Do not wear tight clothes. Do not wear garters on your upper legs.  Exercise your legs. This can help the swelling go down.  Wear elastic bandages or support stockings as told by your doctor.  Eat a low-salt (low-sodium) diet to reduce fluid as told by your doctor.  Depending on the cause of your swelling, you may need to limit how much fluid you drink (fluid restriction).  Take over-the-counter and prescription medicines only as told by your doctor. Contact a doctor if:  Treatment is not working.  You have heart, liver, or kidney disease and have symptoms of edema.  You have sudden and unexplained weight gain. Get help right away if:  You have shortness of breath or chest pain.  You cannot breathe when you lie down.  You have pain, redness, or warmth in the swollen areas.  You have heart, liver, or kidney disease and get edema all of a sudden.  You have a fever and your symptoms get worse all of a sudden. Summary  Edema is when you have too much fluid in your body or under your skin.  Edema may make your legs, feet, and ankles swell up. Swelling is also common in looser tissues, like around your eyes.  Raise (elevate) the swollen body part above the level of your  heart when you are sitting or lying down.  Follow your doctor's instructions about diet and how much fluid you can drink (fluid restriction). This information is not intended to replace advice given to you by your health care provider. Make sure you discuss any questions you have with your health care provider. Document Revised: 07/08/2017 Document Reviewed: 07/23/2016 Elsevier Patient Education  2020 Elsevier Inc.  

## 2020-02-07 ENCOUNTER — Other Ambulatory Visit: Payer: Self-pay

## 2020-02-07 ENCOUNTER — Encounter: Payer: Self-pay | Admitting: Family Medicine

## 2020-02-07 ENCOUNTER — Ambulatory Visit (INDEPENDENT_AMBULATORY_CARE_PROVIDER_SITE_OTHER): Payer: Medicare Other | Admitting: Family Medicine

## 2020-02-07 VITALS — BP 116/69 | HR 80 | Temp 98.0°F | Ht 63.0 in | Wt 193.6 lb

## 2020-02-07 DIAGNOSIS — I10 Essential (primary) hypertension: Secondary | ICD-10-CM

## 2020-02-07 DIAGNOSIS — R600 Localized edema: Secondary | ICD-10-CM

## 2020-02-07 DIAGNOSIS — R7303 Prediabetes: Secondary | ICD-10-CM

## 2020-02-07 DIAGNOSIS — N1832 Chronic kidney disease, stage 3b: Secondary | ICD-10-CM

## 2020-02-07 DIAGNOSIS — K219 Gastro-esophageal reflux disease without esophagitis: Secondary | ICD-10-CM

## 2020-02-07 DIAGNOSIS — J302 Other seasonal allergic rhinitis: Secondary | ICD-10-CM

## 2020-02-07 LAB — BAYER DCA HB A1C WAIVED: HB A1C (BAYER DCA - WAIVED): 6.1 % (ref <7.0)

## 2020-02-07 MED ORDER — MONTELUKAST SODIUM 10 MG PO TABS: 10.0000 mg | ORAL_TABLET | Freq: Every day | ORAL | 2 refills | Status: DC

## 2020-02-07 MED ORDER — FLUTICASONE PROPIONATE 50 MCG/ACT NA SUSP: 2.0000 | Freq: Every day | NASAL | 6 refills | Status: DC

## 2020-02-07 MED ORDER — CETIRIZINE HCL 10 MG PO TABS: 10.0000 mg | ORAL_TABLET | Freq: Every day | ORAL | 2 refills | Status: DC

## 2020-02-07 NOTE — Progress Notes (Signed)
Assessment & Plan:  1. Prediabetes - Patient remains prediabetic.  - Bayer DCA Hb A1c Waived - CMP14+EGFR  2. Essential hypertension - Well controlled on current regimen.   3. Gastroesophageal reflux disease without esophagitis - Well controlled on current regimen.   4. Stage 3b chronic kidney disease - CMP14+EGFR  5. Bilateral lower extremity edema - Well controlled with elevation and compression hose.   6. Seasonal allergies - Improving. Refilled Flonase and Zyrtec. Added Singulair.  - fluticasone (FLONASE) 50 MCG/ACT nasal spray; Place 2 sprays into both nostrils daily.  Dispense: 16 g; Refill: 6 - cetirizine (ZYRTEC) 10 MG tablet; Take 1 tablet (10 mg total) by mouth daily.  Dispense: 30 tablet; Refill: 2 - montelukast (SINGULAIR) 10 MG tablet; Take 1 tablet (10 mg total) by mouth at bedtime.  Dispense: 30 tablet; Refill: 2   Return in about 3 months (around 05/09/2020).  Hendricks Limes, MSN, APRN, FNP-C [facility]  Subjective:    Patient ID: Susan Haney, female    DOB: [birth year], 86 y.o.   MRN: [id number]  Patient Care Team: Loman Brooklyn, FNP as PCP - General (Family Medicine)   Chief Complaint:  Chief Complaint  Patient presents with  . Diabetes    3 month follow up of chronic medical conditions  . Leg Swelling    Patient states that she has been having ongoing lower leg swelling.  Left leg has more swelling.  Marland Kitchen Headache    Patient states that she hit her head in November but it did not start bothering her until Jan.    HPI: Susan Haney is a 86 y.o. female presenting on 02/07/2020 for Diabetes (3 month follow up of chronic medical conditions), Leg Swelling (Patient states that she has been having ongoing lower leg swelling.  Left leg has more swelling.), and Headache (Patient states that she hit her head in November but it did not start bothering her until Jan.)  Patient reports her leg swelling has improved since she  started wearing compression hose and elevating her lower extremities when she is sitting down. She denies any swelling first thing in the morning.   She mentioned to the CMA that her she has been having headaches and that she hit her head in November of last year. Previously when we spoke she felt her headaches were related to her allergies. Today she reports the same stating that she often feels pressure in her head and right ear. It has improved with Zyrtec and Flonase but she still has them occasionally, especially since she has been out of the Flonase and Zyrtec.    Social history:  Relevant past medical, surgical, family and social history reviewed and updated as indicated. Interim medical history since our last visit reviewed.  Allergies and medications reviewed and updated.  DATA REVIEWED: CHART IN EPIC  ROS: Negative unless specifically indicated above in HPI.    Current Outpatient Medications:  .  aspirin EC 81 MG tablet, Take 81 mg by mouth daily., Disp: , Rfl:  .  Biotin 1000 MCG tablet, Take 1,000 mcg by mouth daily., Disp: , Rfl:  .  cholecalciferol (VITAMIN D) 400 UNITS TABS tablet, Take 2,000 Units by mouth., Disp: , Rfl:  .  docusate sodium (COLACE) 100 MG capsule, Take 100 mg by mouth 2 (two) times daily., Disp: , Rfl:  .  hydrochlorothiazide (HYDRODIURIL) 25 MG tablet, Take 0.5 tablets (12.5 mg total) by mouth daily., Disp: 45 tablet, Rfl: 1 .  lisinopril-hydrochlorothiazide (ZESTORETIC) 20-12.5 MG tablet, Take 1 tablet by mouth daily., Disp: 90 tablet, Rfl: 1 .  lubiprostone (AMITIZA) 8 MCG capsule, , Disp: , Rfl:  .  Misc Natural Products (TART CHERRY ADVANCED) CAPS, Take by mouth daily. , Disp: , Rfl:  .  Omega-3 Fatty Acids (OMEGA 3 500 PO), Take 1 tablet by mouth daily. , Disp: , Rfl:  .  omeprazole (PRILOSEC) 20 MG capsule, TAKE 1 CAPSULE (20 MG TOTAL) BY MOUTH DAILY., Disp: 90 capsule, Rfl: 1   No Known Allergies Past Medical History:  Diagnosis Date  . Anemia     history ,  took iron for years  . Arthritis   . Bicuspid aortic valve   . Cancer ([place]) 2017   endometrial cancer / cervical  . Cellulitis 12/31/2017   BLE, ankles  . Chronic back pain   . DDD (degenerative disc disease), lumbar   . Diabetes mellitus without complication ([place])   . Endometrial cancer ([place])   . GERD (gastroesophageal reflux disease)   . History of radiation therapy 06/15/16-07/06/16   vaginal cuff 30 Gy in 5 fractions  . Hypertension   . Spondylosis    lumbar    Past Surgical History:  Procedure Laterality Date  . BUNIONECTOMY    . CARPAL TUNNEL RELEASE Right 05/30/2018   Procedure: RIGHT CARPAL TUNNEL RELEASE;  Surgeon: Daryll Brod, MD;  Location: [facility];  Service: Orthopedics;  Laterality: Right;  . COLONOSCOPY N/A 04/04/2014   Procedure: COLONOSCOPY;  Surgeon: Rogene Houston, MD;  [facility];  Service: Endoscopy;  Laterality: N/A;  100  . EYE SURGERY     bil cataracts and lens implants  . FOOT SURGERY     Left  . Ovary removed: benign    . REPLACEMENT TOTAL KNEE BILATERAL     2013  . ROBOTIC ASSISTED TOTAL HYSTERECTOMY WITH BILATERAL SALPINGO OOPHERECTOMY Left 04/13/2016   Procedure: XI ROBOTIC ASSISTED TOTAL HYSTERECTOMY WITH LEFT SALPINGO OOPHORECTOMY SENTINEL LYMPH NODE BIOPSY;  Surgeon: Everitt Amber, MD;  [facility];  Service: Gynecology;  Laterality: Left;  . TOTAL KNEE REVISION Left 02/12/2016   Procedure: LEFT TOTAL KNEE REVISION;  Surgeon: Rod Can, MD;  [facility];  Service: Orthopedics;  Laterality: Left;    Social History   Socioeconomic History  . Marital status: Widowed    Spouse name: Not on file  . Number of children: 2  . Years of education: Not on file  . Highest education level: Not on file  Occupational History  . Not on file  Tobacco Use  . Smoking status: Never Smoker  . Smokeless tobacco: Never Used  Vaping Use  . Vaping Use: Never used  Substance and Sexual Activity  . Alcohol  use: No    Alcohol/week: 0.0 standard drinks  . Drug use: No  . Sexual activity: Not Currently  Other Topics Concern  . Not on file  Social History Narrative  . Not on file   Social Determinants of Health   Financial Resource Strain:   . Difficulty of Paying Living Expenses:   Food Insecurity:   . Worried About Charity fundraiser in the Last Year:   . Arboriculturist in the Last Year:   Transportation Needs:   . Film/video editor (Medical):   Marland Kitchen Lack of Transportation (Non-Medical):   Physical Activity:   . Days of Exercise per Week:   . Minutes of Exercise per Session:   Stress:   .  Feeling of Stress :   Social Connections:   . Frequency of Communication with Friends and Family:   . Frequency of Social Gatherings with Friends and Family:   . Attends Religious Services:   . Active Member of Clubs or Organizations:   . Attends Archivist Meetings:   Marland Kitchen Marital Status:   Intimate Partner Violence:   . Fear of Current or Ex-Partner:   . Emotionally Abused:   Marland Kitchen Physically Abused:   . Sexually Abused:         Objective:    BP 116/69   Pulse 80   Temp 98 F (36.7 C) (Temporal)   Ht _0  (1.6 m)   Wt 193 lb 9.6 oz (87.8 kg)   SpO2 97%   BMI 34.29 kg/m   Wt Readings from Last 3 Encounters:  02/07/20 193 lb 9.6 oz (87.8 kg)  01/23/20 194 lb (88 kg)  12/28/19 192 lb 8 oz (87.3 kg)    Physical Exam Vitals reviewed.  Constitutional:      General: She is not in acute distress.    Appearance: Normal appearance. She is obese. She is not ill-appearing, toxic-appearing or diaphoretic.  HENT:     Head: Normocephalic and atraumatic.  Eyes:     General: No scleral icterus.       Right eye: No discharge.        Left eye: No discharge.     Conjunctiva/sclera: Conjunctivae normal.  Cardiovascular:     Rate and Rhythm: Normal rate and regular rhythm.     Heart sounds: Normal heart sounds. No murmur heard.  No friction rub. No gallop.   Pulmonary:      Effort: Pulmonary effort is normal. No respiratory distress.     Breath sounds: Normal breath sounds. No stridor. No wheezing, rhonchi or rales.  Musculoskeletal:        General: Normal range of motion.     Cervical back: Normal range of motion.  Skin:    General: Skin is warm and dry.     Capillary Refill: Capillary refill takes less than 2 seconds.  Neurological:     General: No focal deficit present.     Mental Status: She is alert and oriented to person, place, and time. Mental status is at baseline.  Psychiatric:        Mood and Affect: Mood normal.        Behavior: Behavior normal.        Thought Content: Thought content normal.        Judgment: Judgment normal.     Lab Results  Component Value Date   TSH 3.250 08/06/2019   Lab Results  Component Value Date   WBC 8.4 11/08/2019   HGB 11.7 11/08/2019   HCT 36.5 11/08/2019   MCV 88 11/08/2019   PLT 261 11/08/2019   Lab Results  Component Value Date   NA 141 12/20/2019   K 4.0 12/20/2019   CO2 22 12/20/2019   GLUCOSE 108 (H) 12/20/2019   BUN 17 12/20/2019   CREATININE 1.09 (H) 12/20/2019   BILITOT 0.3 11/08/2019   ALKPHOS 50 11/08/2019   AST 13 11/08/2019   ALT 10 11/08/2019   PROT 6.4 11/08/2019   ALBUMIN 4.1 11/08/2019   CALCIUM 9.3 12/20/2019   ANIONGAP 9 05/23/2018   Lab Results  Component Value Date   CHOL 169 08/06/2019   Lab Results  Component Value Date   HDL 56 08/06/2019   Lab Results  Component Value Date   LDLCALC 92 08/06/2019   Lab Results  Component Value Date   TRIG 115 08/06/2019   Lab Results  Component Value Date   CHOLHDL 3.0 08/06/2019   Lab Results  Component Value Date   HGBA1C 6.0 11/08/2019

## 2020-02-08 ENCOUNTER — Telehealth: Payer: Self-pay | Admitting: Family Medicine

## 2020-02-08 ENCOUNTER — Other Ambulatory Visit: Payer: Self-pay | Admitting: Family Medicine

## 2020-02-08 DIAGNOSIS — N1832 Chronic kidney disease, stage 3b: Secondary | ICD-10-CM

## 2020-02-08 LAB — CMP14+EGFR
ALT: 9 IU/L (ref 0–32)
AST: 15 IU/L (ref 0–40)
Albumin/Globulin Ratio: 1.6 (ref 1.2–2.2)
Albumin: 3.8 g/dL (ref 3.6–4.6)
Alkaline Phosphatase: 55 IU/L (ref 48–121)
BUN/Creatinine Ratio: 19 (ref 12–28)
BUN: 25 mg/dL (ref 8–27)
Bilirubin Total: 0.3 mg/dL (ref 0.0–1.2)
CO2: 23 mmol/L (ref 20–29)
Calcium: 9.1 mg/dL (ref 8.7–10.3)
Chloride: 104 mmol/L (ref 96–106)
Creatinine, Ser: 1.3 mg/dL — ABNORMAL HIGH (ref 0.57–1.00)
GFR calc Af Amer: 43 mL/min/{1.73_m2} — ABNORMAL LOW (ref >59)
GFR calc non Af Amer: 37 mL/min/{1.73_m2} — ABNORMAL LOW (ref >59)
Globulin, Total: 2.4 g/dL (ref 1.5–4.5)
Glucose: 112 mg/dL — ABNORMAL HIGH (ref 65–99)
Potassium: 4.2 mmol/L (ref 3.5–5.2)
Sodium: 141 mmol/L (ref 134–144)
Total Protein: 6.2 g/dL (ref 6.0–8.5)

## 2020-02-08 MED ORDER — DAPAGLIFLOZIN PROPANEDIOL 10 MG PO TABS: ORAL_TABLET | ORAL | 2 refills | Status: DC

## 2020-02-08 NOTE — Telephone Encounter (Signed)
Reviewed labs

## 2020-02-10 ENCOUNTER — Encounter: Payer: Self-pay | Admitting: Family Medicine

## 2020-02-14 ENCOUNTER — Other Ambulatory Visit: Payer: Self-pay | Admitting: Family Medicine

## 2020-02-25 ENCOUNTER — Telehealth (INDEPENDENT_AMBULATORY_CARE_PROVIDER_SITE_OTHER): Payer: Self-pay | Admitting: Internal Medicine

## 2020-02-25 ENCOUNTER — Other Ambulatory Visit (INDEPENDENT_AMBULATORY_CARE_PROVIDER_SITE_OTHER): Payer: Self-pay | Admitting: Internal Medicine

## 2020-02-25 NOTE — Telephone Encounter (Signed)
Stool diary reviewed with patient She is on Amitiza 8 mcg daily or every other day.   Defecation not to her satisfaction.  She has good days and bad days and concerned that she may have as many as 3 stools.  At times she has to spend more than 20 minutes in bathroom She eats fiber rich cereal in the morning usually raisin Bran's and eats apple every day. We will try Amitiza 16 mcg every other day for few weeks and let us know.

## 2020-03-30 ENCOUNTER — Other Ambulatory Visit (INDEPENDENT_AMBULATORY_CARE_PROVIDER_SITE_OTHER): Payer: Self-pay | Admitting: Internal Medicine

## 2020-04-04 ENCOUNTER — Telehealth: Payer: Self-pay | Admitting: Family Medicine

## 2020-04-04 NOTE — Telephone Encounter (Signed)
Patient called to ask if it would be okay to take a Dulcolax since she is already on Amitiza. She is suffering from severe constipation. Advised   yes okay to take as needed and make sure to stay hydrated.

## 2020-04-08 ENCOUNTER — Telehealth (INDEPENDENT_AMBULATORY_CARE_PROVIDER_SITE_OTHER): Payer: Self-pay | Admitting: Internal Medicine

## 2020-04-08 MED ORDER — LUBIPROSTONE 8 MCG PO CAPS: 8.0000 ug | ORAL_CAPSULE | Freq: Two times a day (BID) | ORAL | 1 refills | Status: AC

## 2020-04-08 NOTE — Telephone Encounter (Addendum)
I spoke with the patient and she states she is still having constipation she has had a bowel movement this am. It was a normal size and consistency. She states she is taking Amitiza 8 mg daily some days it works others it does not seem to. She states she took one tablet yesterday and one today,but she has in the past skipped a day and took two tablets the following day. She states after taking the tablet she feels nauseated and has to place a candy or gum in her mouth to ease the nausea. She states since decreasing the Amitza down from 24 to 8 she does not feel as nauseated . She would like to know if there are other recommendations and also what causes her to have the constipation. Please advise. I spoke with Dr. Laural Golden whom recommended the patient increase the amitiza to 16 mcg daily and take some form of fiber per day either metamucil or an apple with the skin on it. Patient is aware of all and states she eats an apple with the skin daily. She would like the Amitiza 16 mcg sent to [place]. Medication changed in Epic and sent in new rx to the requested pharmacy.

## 2020-04-08 NOTE — Telephone Encounter (Signed)
Patient called stated she is still having constipation - says she can't eat - the medication is causing nausea - please advise - ph# [phone]

## 2020-04-15 ENCOUNTER — Telehealth (INDEPENDENT_AMBULATORY_CARE_PROVIDER_SITE_OTHER): Payer: Self-pay | Admitting: Internal Medicine

## 2020-04-15 NOTE — Telephone Encounter (Signed)
Patient left message stated she had spoke to you about a refill - pharmacy has not received it - please advise

## 2020-04-16 NOTE — Telephone Encounter (Signed)
I spoke with the patient and made aware that we had sent the medication. She states she did receive a box from the pharmacy,but had not opened it. She states she opened it and the medication was there.

## 2020-05-01 ENCOUNTER — Other Ambulatory Visit: Payer: Self-pay | Admitting: Family Medicine

## 2020-05-01 DIAGNOSIS — J302 Other seasonal allergic rhinitis: Secondary | ICD-10-CM

## 2020-05-01 DIAGNOSIS — N1832 Chronic kidney disease, stage 3b: Secondary | ICD-10-CM

## 2020-05-05 MED ORDER — DAPAGLIFLOZIN PROPANEDIOL 10 MG PO TABS: 10.0000 mg | ORAL_TABLET | Freq: Every day | ORAL | 1 refills | Status: DC

## 2020-05-05 NOTE — Addendum Note (Signed)
Addended by: Antonietta Barcelona D on: 05/05/2020 09:09 AM   Modules accepted: Orders

## 2020-05-05 NOTE — Telephone Encounter (Signed)
E-prescribe down. resent 

## 2020-05-13 ENCOUNTER — Ambulatory Visit: Payer: Medicare Other | Admitting: Family Medicine

## 2020-05-16 ENCOUNTER — Other Ambulatory Visit: Payer: Self-pay

## 2020-05-16 ENCOUNTER — Ambulatory Visit (INDEPENDENT_AMBULATORY_CARE_PROVIDER_SITE_OTHER): Payer: Medicare Other

## 2020-05-16 ENCOUNTER — Encounter: Payer: Self-pay | Admitting: Family Medicine

## 2020-05-16 ENCOUNTER — Ambulatory Visit (INDEPENDENT_AMBULATORY_CARE_PROVIDER_SITE_OTHER): Payer: Medicare Other | Admitting: Family Medicine

## 2020-05-16 VITALS — BP 133/65 | HR 67 | Temp 98.3°F | Ht 63.0 in | Wt 181.4 lb

## 2020-05-16 DIAGNOSIS — Z13820 Encounter for screening for osteoporosis: Secondary | ICD-10-CM

## 2020-05-16 DIAGNOSIS — E119 Type 2 diabetes mellitus without complications: Secondary | ICD-10-CM | POA: Diagnosis not present

## 2020-05-16 DIAGNOSIS — Z780 Asymptomatic menopausal state: Secondary | ICD-10-CM

## 2020-05-16 DIAGNOSIS — K219 Gastro-esophageal reflux disease without esophagitis: Secondary | ICD-10-CM

## 2020-05-16 DIAGNOSIS — I10 Essential (primary) hypertension: Secondary | ICD-10-CM

## 2020-05-16 LAB — LIPID PANEL

## 2020-05-16 LAB — BAYER DCA HB A1C WAIVED: HB A1C (BAYER DCA - WAIVED): 6.1 % (ref <7.0)

## 2020-05-16 MED ORDER — ACCU-CHEK AVIVA PLUS VI STRP: ORAL_STRIP | 12 refills | Status: DC

## 2020-05-16 NOTE — Patient Instructions (Addendum)
DASH Eating Plan DASH stands for "Dietary Approaches to Stop Hypertension." The DASH eating plan is a healthy eating plan that has been shown to reduce high blood pressure (hypertension). It may also reduce your risk for type 2 diabetes, heart disease, and stroke. The DASH eating plan may also help with weight loss. What are tips for following this plan?  General guidelines  Avoid eating more than 2,300 mg (milligrams) of salt (sodium) a day. If you have hypertension, you may need to reduce your sodium intake to 1,500 mg a day.  Limit alcohol intake to no more than 1 drink a day for nonpregnant women and 2 drinks a day for men. One drink equals 12 oz of beer, 5 oz of wine, or 1 oz of hard liquor.  Work with your health care provider to maintain a healthy body weight or to lose weight. Ask what an ideal weight is for you.  Get at least 30 minutes of exercise that causes your heart to beat faster (aerobic exercise) most days of the week. Activities may include walking, swimming, or biking.  Work with your health care provider or diet and nutrition specialist (dietitian) to adjust your eating plan to your individual calorie needs. Reading food labels   Check food labels for the amount of sodium per serving. Choose foods with less than 5 percent of the Daily Value of sodium. Generally, foods with less than 300 mg of sodium per serving fit into this eating plan.  To find whole grains, look for the word "whole" as the first word in the ingredient list. Shopping  Buy products labeled as "low-sodium" or "no salt added."  Buy fresh foods. Avoid canned foods and premade or frozen meals. Cooking  Avoid adding salt when cooking. Use salt-free seasonings or herbs instead of table salt or sea salt. Check with your health care provider or pharmacist before using salt substitutes.  Do not fry foods. Cook foods using healthy methods such as baking, boiling, grilling, and broiling instead.  Cook with  heart-healthy oils, such as olive, canola, soybean, or sunflower oil. Meal planning  Eat a balanced diet that includes: ? 5 or more servings of fruits and vegetables each day. At each meal, try to fill half of your plate with fruits and vegetables. ? Up to 6-8 servings of whole grains each day. ? Less than 6 oz of lean meat, poultry, or fish each day. A 3-oz serving of meat is about the same size as a deck of cards. One egg equals 1 oz. ? 2 servings of low-fat dairy each day. ? A serving of nuts, seeds, or beans 5 times each week. ? Heart-healthy fats. Healthy fats called Omega-3 fatty acids are found in foods such as flaxseeds and coldwater fish, like sardines, salmon, and mackerel.  Limit how much you eat of the following: ? Canned or prepackaged foods. ? Food that is high in trans fat, such as fried foods. ? Food that is high in saturated fat, such as fatty meat. ? Sweets, desserts, sugary drinks, and other foods with added sugar. ? Full-fat dairy products.  Do not salt foods before eating.  Try to eat at least 2 vegetarian meals each week.  Eat more home-cooked food and less restaurant, buffet, and fast food.  When eating at a restaurant, ask that your food be prepared with less salt or no salt, if possible. What foods are recommended? The items listed may not be a complete list. Talk with your dietitian about   what dietary choices are best for you. Grains Whole-grain or whole-wheat bread. Whole-grain or whole-wheat pasta. Brown rice. [place]. Bulgur. Whole-grain and low-sodium cereals. Pita bread. Low-fat, low-sodium crackers. Whole-wheat flour tortillas. Vegetables Fresh or frozen vegetables (raw, steamed, roasted, or grilled). Low-sodium or reduced-sodium tomato and vegetable juice. Low-sodium or reduced-sodium tomato sauce and tomato paste. Low-sodium or reduced-sodium canned vegetables. Fruits All fresh, dried, or frozen fruit. Canned fruit in natural juice (without  added sugar). Meat and other protein foods Skinless chicken or [place]. Ground chicken or [place]. Pork with fat trimmed off. Fish and seafood. Egg whites. Dried beans, peas, or lentils. Unsalted nuts, nut butters, and seeds. Unsalted canned beans. Lean cuts of beef with fat trimmed off. Low-sodium, lean deli meat. Dairy Low-fat (1%) or fat-free (skim) milk. Fat-free, low-fat, or reduced-fat cheeses. Nonfat, low-sodium ricotta or cottage cheese. Low-fat or nonfat yogurt. Low-fat, low-sodium cheese. Fats and oils Soft margarine without trans fats. Vegetable oil. Low-fat, reduced-fat, or light mayonnaise and salad dressings (reduced-sodium). Canola, safflower, olive, soybean, and sunflower oils. Avocado. Seasoning and other foods Herbs. Spices. Seasoning mixes without salt. Unsalted popcorn and pretzels. Fat-free sweets. What foods are not recommended? The items listed may not be a complete list. Talk with your dietitian about what dietary choices are best for you. Grains Baked goods made with fat, such as croissants, muffins, or some breads. Dry pasta or rice meal packs. Vegetables Creamed or fried vegetables. Vegetables in a cheese sauce. Regular canned vegetables (not low-sodium or reduced-sodium). Regular canned tomato sauce and paste (not low-sodium or reduced-sodium). Regular tomato and vegetable juice (not low-sodium or reduced-sodium). Angie Fava. Olives. Fr Hypertension, Adult High blood pressure (hypertension) is when the force of blood pumping through the arteries is too strong. The arteries are the blood vessels that carry blood from the heart throughout the body. Hypertension forces the heart to work harder to pump blood and may cause arteries to become narrow or stiff. Untreated or uncontrolled hypertension can cause a heart attack, heart failure, a stroke, kidney disease, and other problems. A blood pressure reading consists of a higher number over a lower number. Ideally, your blood  pressure should be below 120/80. The first ("top") number is called the systolic pressure. It is a measure of the pressure in your arteries as your heart beats. The second ("bottom") number is called the diastolic pressure. It is a measure of the pressure in your arteries as the heart relaxes. What are the causes? The exact cause of this condition is not known. There are some conditions that result in or are related to high blood pressure. What increases the risk? Some risk factors for high blood pressure are under your control. The following factors may make you more likely to develop this condition:  Smoking.  Having type 2 diabetes mellitus, high cholesterol, or both.  Not getting enough exercise or physical activity.  Being overweight.  Having too much fat, sugar, calories, or salt (sodium) in your diet.  Drinking too much alcohol. Some risk factors for high blood pressure may be difficult or impossible to change. Some of these factors include:  Having chronic kidney disease.  Having a family history of high blood pressure.  Age. Risk increases with age.  Race. You may be at higher risk if you are African American.  Gender. Men are at higher risk than women before age 58. After age 67, women are at higher risk than men.  Having obstructive sleep apnea.  Stress. What are the signs or  symptoms? High blood pressure may not cause symptoms. Very high blood pressure (hypertensive crisis) may cause:  Headache.  Anxiety.  Shortness of breath.  Nosebleed.  Nausea and vomiting.  Vision changes.  Severe chest pain.  Seizures. How is this diagnosed? This condition is diagnosed by measuring your blood pressure while you are seated, with your arm resting on a flat surface, your legs uncrossed, and your feet flat on the floor. The cuff of the blood pressure monitor will be placed directly against the skin of your upper arm at the level of your heart. It should be measured at  least twice using the same arm. Certain conditions can cause a difference in blood pressure between your right and left arms. Certain factors can cause blood pressure readings to be lower or higher than normal for a short period of time:  When your blood pressure is higher when you are in a health care provider's office than when you are at home, this is called white coat hypertension. Most people with this condition do not need medicines.  When your blood pressure is higher at home than when you are in a health care provider's office, this is called masked hypertension. Most people with this condition may need medicines to control blood pressure. If you have a high blood pressure reading during one visit or you have normal blood pressure with other risk factors, you may be asked to:  Return on a different day to have your blood pressure checked again.  Monitor your blood pressure at home for 1 week or longer. If you are diagnosed with hypertension, you may have other blood or imaging tests to help your health care provider understand your overall risk for other conditions. How is this treated? This condition is treated by making healthy lifestyle changes, such as eating healthy foods, exercising more, and reducing your alcohol intake. Your health care provider may prescribe medicine if lifestyle changes are not enough to get your blood pressure under control, and if:  Your systolic blood pressure is above 130.  Your diastolic blood pressure is above 80. Your personal target blood pressure may vary depending on your medical conditions, your age, and other factors. Follow these instructions at home: Eating and drinking   Eat a diet that is high in fiber and potassium, and low in sodium, added sugar, and fat. An example eating plan is called the DASH (Dietary Approaches to Stop Hypertension) diet. To eat this way: ? Eat plenty of fresh fruits and vegetables. Try to fill one half of your plate  at each meal with fruits and vegetables. ? Eat whole grains, such as whole-wheat pasta, brown rice, or whole-grain bread. Fill about one fourth of your plate with whole grains. ? Eat or drink low-fat dairy products, such as skim milk or low-fat yogurt. ? Avoid fatty cuts of meat, processed or cured meats, and poultry with skin. Fill about one fourth of your plate with lean proteins, such as fish, chicken without skin, beans, eggs, or tofu. ? Avoid pre-made and processed foods. These tend to be higher in sodium, added sugar, and fat.  Reduce your daily sodium intake. Most people with hypertension should eat less than 1,500 mg of sodium a day.  Do not drink alcohol if: ? Your health care provider tells you not to drink. ? You are pregnant, may be pregnant, or are planning to become pregnant.  If you drink alcohol: ? Limit how much you use to:  0-1 drink a day  for women.  0-2 drinks a day for men. ? Be aware of how much alcohol is in your drink. In the [place] one drink equals one 12 oz bottle of beer (355 mL), one 5 oz glass of wine (148 mL), or one 1 oz glass of hard liquor (44 mL). Lifestyle   Work with your health care provider to maintain a healthy body weight or to lose weight. Ask what an ideal weight is for you.  Get at least 30 minutes of exercise most days of the week. Activities may include walking, swimming, or biking.  Include exercise to strengthen your muscles (resistance exercise), such as Pilates or lifting weights, as part of your weekly exercise routine. Try to do these types of exercises for 30 minutes at least 3 days a week.  Do not use any products that contain nicotine or tobacco, such as cigarettes, e-cigarettes, and chewing tobacco. If you need help quitting, ask your health care provider.  Monitor your blood pressure at home as told by your health care provider.  Keep all follow-up visits as told by your health care provider. This is important. Medicines  Take  over-the-counter and prescription medicines only as told by your health care provider. Follow directions carefully. Blood pressure medicines must be taken as prescribed.  Do not skip doses of blood pressure medicine. Doing this puts you at risk for problems and can make the medicine less effective.  Ask your health care provider about side effects or reactions to medicines that you should watch for. Contact a health care provider if you:  Think you are having a reaction to a medicine you are taking.  Have headaches that keep coming back (recurring).  Feel dizzy.  Have swelling in your ankles.  Have trouble with your vision. Get help right away if you:  Develop a severe headache or confusion.  Have unusual weakness or numbness.  Feel faint.  Have severe pain in your chest or abdomen.  Vomit repeatedly.  Have trouble breathing. Summary  Hypertension is when the force of blood pumping through your arteries is too strong. If this condition is not controlled, it may put you at risk for serious complications.  Your personal target blood pressure may vary depending on your medical conditions, your age, and other factors. For most people, a normal blood pressure is less than 120/80.  Hypertension is treated with lifestyle changes, medicines, or a combination of both. Lifestyle changes include losing weight, eating a healthy, low-sodium diet, exercising more, and limiting alcohol. This information is not intended to replace advice given to you by your health care provider. Make sure you discuss any questions you have with your health care provider. Document Revised: 03/15/2018 Document Reviewed: 03/15/2018 Elsevier Patient Education  [place]. uits Canned fruit in a light or heavy syrup. Fried fruit. Fruit in cream or butter sauce. Meat and other protein foods Fatty cuts of meat. Ribs. Fried meat. Berniece Salines. Sausage. Bologna and other processed lunch meats. Salami. Fatback.  Hotdogs. Bratwurst. Salted nuts and seeds. Canned beans with added salt. Canned or smoked fish. Whole eggs or egg yolks. Chicken or [place] with skin. Dairy Whole or 2% milk, cream, and half-and-half. Whole or full-fat cream cheese. Whole-fat or sweetened yogurt. Full-fat cheese. Nondairy creamers. Whipped toppings. Processed cheese and cheese spreads. Fats and oils Butter. Stick margarine. Lard. Shortening. Ghee. Bacon fat. Tropical oils, such as coconut, palm kernel, or palm oil. Seasoning and other foods Salted popcorn and pretzels. Onion salt, garlic salt,  seasoned salt, table salt, and sea salt. Worcestershire sauce. Tartar sauce. Barbecue sauce. Teriyaki sauce. Soy sauce, including reduced-sodium. Steak sauce. Canned and packaged gravies. Fish sauce. Oyster sauce. Cocktail sauce. Horseradish that you find on the shelf. Ketchup. Mustard. Meat flavorings and tenderizers. Bouillon cubes. Hot sauce and Tabasco sauce. Premade or packaged marinades. Premade or packaged taco seasonings. Relishes. Regular salad dressings. Where to find more information:  National Heart, Lung, and [place]: https://wilson-eaton.com/  American Heart Association: www.heart.org Summary  The DASH eating plan is a healthy eating plan that has been shown to reduce high blood pressure (hypertension). It may also reduce your risk for type 2 diabetes, heart disease, and stroke.  With the DASH eating plan, you should limit salt (sodium) intake to 2,300 mg a day. If you have hypertension, you may need to reduce your sodium intake to 1,500 mg a day.  When on the DASH eating plan, aim to eat more fresh fruits and vegetables, whole grains, lean proteins, low-fat dairy, and heart-healthy fats.  Work with your health care provider or diet and nutrition specialist (dietitian) to adjust your eating plan to your individual calorie needs. This information is not intended to replace advice given to you by your health care provider. Make  sure you discuss any questions you have with your health care provider. Document Revised: 06/17/2017 Document Reviewed: 06/28/2016 Elsevier Patient Education  2020 [employer].

## 2020-05-16 NOTE — Progress Notes (Signed)
Patient ID: Susan Haney, female    DOB: 09-Jul-1932, 86 y.o.   MRN: [id number]  Chief Complaint:  Medical Management of Chronic Issues   HPI: Susan Haney is a 86 y.o. female presenting on 05/16/2020 for Medical Management of Chronic Issues   1. Essential hypertension   2. Type 2 diabetes mellitus without complication, without long-term current use of insulin ([place])   3. Gastroesophageal reflux disease without esophagitis     Complaint with meds - HCTZ 25 mg daily, Zestoretic 20/12.5 mg daily Checking BP at home ranging 120/70s Exercising Regularly - daily, does a stationary bike, leg lifts, stretching for an hour Watching Salt intake - Yes Pertinent ROS:  Headache - No Chest pain - No Dyspnea - No Palpitations - No LE edema - Yes, has improved with Fargixa  They report good compliance with medications and can restate their regimen by memory. No medication side effects  2. T2DM Pt presents for follow up evaluation of T2DM. Current symptoms include none. Patient denies foot ulcerations, increased appetite, nausea, paresthesia of the feet, polydipsia, polyuria, visual disturbances, vomiting and weight loss.  Current diabetic medications include Farxiga 10 mg Compliant with meds - Yes  Current monitoring regimen: daily blood checks Home blood sugar records: checking randomly 120-130 Any episodes of hypoglycemia? no  Current diet: has been eating more vegetables lately, has a hard time cooking for just herself Current exercise: see above  Is She on ACE inhibitor or angiotensin II receptor blocker?  Yes, lisinopril Is She on statin? no Is She on ASA 81 mg daily?  Yes  3. GERD Compliant with medications - yes Current medications - Prilosec 20 mg  Adverse side effects - No DEXA if on PPI - DEXA scan ordered to evaluate the patient for osteoporosis.  Cough - No Sore throat - No Voice change - No Hemoptysis - No Dysphagia or dyspepsia - No Water brash - No Red  Flags (weight loss, hematochezia, melena, weight loss, early satiety, fevers, odynophagia, or persistent vomiting) - No  PMH: Smoking status noted  Review of Systems Per HPI     BP 133/65   Pulse 67   Temp 98.3 F (36.8 C) (Temporal)   Ht '5\' 3"'  (1.6 m)   Wt 181 lb 6 oz (82.3 kg)   BMI 32.13 kg/m   Gen: NAD, alert, cooperative with exam HEENT: NCAT, EOMI, PERRL CV: RRR, good S1/S2, no murmur Resp: CTABL, no wheezes, non-labored Abd: SNTND, BS present, no guarding or organomegaly Ext: No edema, warm Neuro: Alert and oriented, No gross deficits    Assessment and Plan: Billy was seen today for medical management of chronic issues.  Diagnoses and all orders for this visit:  Essential hypertension  Well controlled on current regimen.  -     Lipid panel -     CBC with Differential/Platelet -     CMP14+EGFR  Type 2 diabetes mellitus without complication, without long-term current use of insulin (HCC) Well controlled on current regimen. A1C 6.1 today -     Lipid panel -     CBC with Differential/Platelet -     CMP14+EGFR -     Bayer DCA Hb A1c Waived -     glucose blood (ACCU-CHEK AVIVA PLUS) test strip; Use to check Blood Sugars 1-2 times daily. Dx E11.9  Gastroesophageal reflux disease without esophagitis Well controlled on current regimen.   Screening for osteoporosis -     DG [facility] DEXA  Educational handout  given for Hypertension, DASH diet  Follow up in 3 months for chronic illnesses.   The above assessment and management plan was discussed with the patient. The patient verbalized understanding of and has agreed to the management plan. Patient is aware to call the clinic if symptoms persist or worsen. Patient is aware when to return to the clinic for a follow-up visit. Patient educated on when it is appropriate to go to the emergency department.   Marjorie Smolder, FNP-C [facility] [phone]

## 2020-05-17 LAB — CMP14+EGFR
ALT: 9 IU/L (ref 0–32)
AST: 11 IU/L (ref 0–40)
Albumin/Globulin Ratio: 1.9 (ref 1.2–2.2)
Albumin: 4 g/dL (ref 3.6–4.6)
Alkaline Phosphatase: 52 IU/L (ref 44–121)
BUN/Creatinine Ratio: 20 (ref 12–28)
BUN: 25 mg/dL (ref 8–27)
Bilirubin Total: 0.2 mg/dL (ref 0.0–1.2)
CO2: 21 mmol/L (ref 20–29)
Calcium: 9.2 mg/dL (ref 8.7–10.3)
Chloride: 106 mmol/L (ref 96–106)
Creatinine, Ser: 1.22 mg/dL — ABNORMAL HIGH (ref 0.57–1.00)
GFR calc Af Amer: 46 mL/min/{1.73_m2} — ABNORMAL LOW (ref >59)
GFR calc non Af Amer: 40 mL/min/{1.73_m2} — ABNORMAL LOW (ref >59)
Globulin, Total: 2.1 g/dL (ref 1.5–4.5)
Glucose: 88 mg/dL (ref 65–99)
Potassium: 4.1 mmol/L (ref 3.5–5.2)
Sodium: 142 mmol/L (ref 134–144)
Total Protein: 6.1 g/dL (ref 6.0–8.5)

## 2020-05-17 LAB — CBC WITH DIFFERENTIAL/PLATELET
Basophils Absolute: 0.1 10*3/uL (ref 0.0–0.2)
Basos: 1 %
EOS (ABSOLUTE): 0.2 10*3/uL (ref 0.0–0.4)
Eos: 3 %
Hematocrit: 36 % (ref 34.0–46.6)
Hemoglobin: 11.7 g/dL (ref 11.1–15.9)
Immature Grans (Abs): 0 10*3/uL (ref 0.0–0.1)
Immature Granulocytes: 0 %
Lymphocytes Absolute: 2.1 10*3/uL (ref 0.7–3.1)
Lymphs: 29 %
MCH: 28.7 pg (ref 26.6–33.0)
MCHC: 32.5 g/dL (ref 31.5–35.7)
MCV: 88 fL (ref 79–97)
Monocytes Absolute: 0.8 10*3/uL (ref 0.1–0.9)
Monocytes: 10 %
Neutrophils Absolute: 4.2 10*3/uL (ref 1.4–7.0)
Neutrophils: 57 %
Platelets: 274 10*3/uL (ref 150–450)
RBC: 4.08 x10E6/uL (ref 3.77–5.28)
RDW: 14.2 % (ref 11.7–15.4)
WBC: 7.5 10*3/uL (ref 3.4–10.8)

## 2020-05-17 LAB — LIPID PANEL
Chol/HDL Ratio: 3.2 ratio (ref 0.0–4.4)
Cholesterol, Total: 162 mg/dL (ref 100–199)
HDL: 50 mg/dL (ref >39)
LDL Chol Calc (NIH): 96 mg/dL (ref 0–99)
Triglycerides: 85 mg/dL (ref 0–149)
VLDL Cholesterol Cal: 16 mg/dL (ref 5–40)

## 2020-05-19 ENCOUNTER — Other Ambulatory Visit: Payer: Self-pay | Admitting: Family Medicine

## 2020-06-11 ENCOUNTER — Telehealth (INDEPENDENT_AMBULATORY_CARE_PROVIDER_SITE_OTHER): Payer: Self-pay | Admitting: Internal Medicine

## 2020-06-11 NOTE — Telephone Encounter (Signed)
Patient left voice mail message stating she hasn't had a BM in 3 days - states she has taken all of her medication - please advise - ph# [phone]

## 2020-06-11 NOTE — Telephone Encounter (Signed)
Dr. Jenetta Downer , may I ask you to review. Patient has a hx of Idiopathic Constipation and has tried several things in the past. At her last visit she was ask to take the Trulance, not sure if she has done so.  Thank You

## 2020-06-11 NOTE — Telephone Encounter (Signed)
Noted  

## 2020-06-11 NOTE — Telephone Encounter (Signed)
I called the patient to the phone in file, however, the call was sent to voicemail.  I left a detailed voice message to call back.  Patient was previously taking Amitiza daily but unsure if she was taking this compliantly as she has been taking it every other day occasionally in the past.  Maylon Peppers, MD Gastroenterology and Hepatology [facility]

## 2020-07-24 ENCOUNTER — Ambulatory Visit
Admission: RE | Admit: 2020-07-24 | Discharge: 2020-07-24 | Disposition: A | Discharge status: Home / Self Care | Payer: Medicare Other | Source: Ambulatory Visit | Attending: Radiation Oncology | Admitting: Radiation Oncology

## 2020-07-24 ENCOUNTER — Encounter: Payer: Self-pay | Admitting: Radiation Oncology

## 2020-07-24 ENCOUNTER — Other Ambulatory Visit: Payer: Self-pay

## 2020-07-24 DIAGNOSIS — Z8542 Personal history of malignant neoplasm of other parts of uterus: Secondary | ICD-10-CM | POA: Insufficient documentation

## 2020-07-24 DIAGNOSIS — Z923 Personal history of irradiation: Secondary | ICD-10-CM | POA: Diagnosis not present

## 2020-07-24 DIAGNOSIS — C541 Malignant neoplasm of endometrium: Secondary | ICD-10-CM

## 2020-07-24 NOTE — Progress Notes (Signed)
Patient is here for a f/u visit with Dr. Roselind Messier. Her last radiation was in 2017. Patient reports ongoing back and knee pain she rates as a "10".  BP (!) 166/67 (BP Location: Left Arm, Patient Position: Sitting, Cuff Size: Normal)   Pulse 78   Temp 97.8 F (36.6 C)   Resp 20   Ht 5\' 3"  (1.6 m)   Wt 188 lb 6.4 oz (85.5 kg)   SpO2 98%   BMI 33.37 kg/m   Wt Readings from Last 3 Encounters:  07/24/20 188 lb 6.4 oz (85.5 kg)  05/16/20 181 lb 6 oz (82.3 kg)  02/07/20 193 lb 9.6 oz (87.8 kg)

## 2020-07-24 NOTE — Progress Notes (Signed)
Radiation Oncology         [phone] ________________________________  Name: Susan Haney MRN: [id number]  Date: 07/24/2020  DOB: 1932-04-19  Follow-Up Visit Note  CC: Loman Brooklyn, FNP  Everitt Amber, MD    ICD-10-CM   1. Endometrial cancer ([place])  C54.1     Diagnosis: Stage IB grade 2 endometrioid adenocarcinoma with high/intermediate risk factors and microsatellite instability on routine tumor testing  Interval Since Last Radiation: Four years, two weeks, and four days  Radiation treatment dates:   06/15/2016, 06/22/2016, 06/29/2016, 07/01/2016, 07/06/2016  Site/dose:  Vaginal Cuff / 30 Gy in 5 fractions  Narrative:  The patient returns today for routine follow-up. She is doing well overall. She was last seen by Dr. Denman George on 12/28/2019, during which time there was no evidence of recurrence.  On review of systems, she reports no new medical issues.  She denies any vaginal bleeding or discharge.  She denies any hematuria or rectal bleeding.  ALLERGIES:  has No Known Allergies.  Meds: Current Outpatient Medications  Medication Sig Dispense Refill  . aspirin EC 81 MG tablet Take 81 mg by mouth daily.    . Biotin 1000 MCG tablet Take 1,000 mcg by mouth daily.    . cetirizine (ZYRTEC) 10 MG tablet TAKE 1 TABLET BY MOUTH EVERY DAY 90 tablet 0  . cholecalciferol (VITAMIN D) 400 UNITS TABS tablet Take 2,000 Units by mouth.    . docusate sodium (COLACE) 100 MG capsule Take 100 mg by mouth 2 (two) times daily.    . fluticasone (FLONASE) 50 MCG/ACT nasal spray Place 2 sprays into both nostrils daily. 16 g 6  . glucose blood (ACCU-CHEK AVIVA PLUS) test strip Use to check Blood Sugars 1-2 times daily. Dx E11.9 100 each 12  . hydrochlorothiazide (HYDRODIURIL) 25 MG tablet TAKE ONE-HALF TABLET BY  MOUTH DAILY 45 tablet 1  . lisinopril-hydrochlorothiazide (ZESTORETIC) 20-12.5 MG tablet TAKE 1 TABLET BY MOUTH  DAILY 90 tablet 1  . Misc Natural Products (TART CHERRY ADVANCED) CAPS  Take by mouth daily.     . montelukast (SINGULAIR) 10 MG tablet TAKE 1 TABLET BY MOUTH EVERYDAY AT BEDTIME 90 tablet 0  . Omega-3 Fatty Acids (OMEGA 3 500 PO) Take 1 tablet by mouth daily.     Marland Kitchen omeprazole (PRILOSEC) 20 MG capsule TAKE 1 CAPSULE BY MOUTH  DAILY 90 capsule 1   No current facility-administered medications for this encounter.    Physical Findings: The patient is in no acute distress. Patient is alert and oriented.  height is _0  (1.6 m) and weight is 188 lb 6.4 oz (85.5 kg). Her temperature is 97.8 F (36.6 C). Her blood pressure is 166/67 (abnormal) and her pulse is 78. Her respiration is 20 and oxygen saturation is 98%.   Lungs are clear to auscultation bilaterally. Heart has regular rate and rhythm. No palpable cervical, supraclavicular, or axillary adenopathy. Abdomen soft, non-tender, normal bowel sounds.  On pelvic examination the external genitalia were unremarkable. A speculum exam was performed. There are no mucosal lesions noted in the vaginal vault. On bimanual examination there were no pelvic masses appreciated.  Vaginal cuff intact.  Lab Findings: Lab Results  Component Value Date   WBC 7.5 05/16/2020   HGB 11.7 05/16/2020   HCT 36.0 05/16/2020   MCV 88 05/16/2020   PLT 274 05/16/2020    Radiographic Findings: No results found.  Impression: Stage IB grade 2 endometrioid adenocarcinoma with high/intermediate risk factors and microsatellite instability  on routine tumor testing  No evidence of recurrence on clinical exam.   Plan:  The patient will follow-up with Dr. Denman George in six months and with radiation oncology in one year.  Total time spent in this encounter was 21 minutes which included reviewing the patient's most recent follow-up with Dr. Denman George, physical examination, and documentation. ________________________________  Blair Promise, PhD, MD  This document serves as a record of services personally performed by Gery Pray, MD. It was created  on his behalf by Clerance Lav, a trained medical scribe. The creation of this record is based on the scribe's personal observations and the provider's statements to them. This document has been checked and approved by the attending provider.

## 2020-08-28 ENCOUNTER — Other Ambulatory Visit: Payer: Self-pay | Admitting: Family Medicine

## 2020-08-28 DIAGNOSIS — J302 Other seasonal allergic rhinitis: Secondary | ICD-10-CM

## 2020-08-30 ENCOUNTER — Other Ambulatory Visit: Payer: Self-pay | Admitting: Family Medicine

## 2020-10-06 ENCOUNTER — Other Ambulatory Visit: Payer: Self-pay | Admitting: Family Medicine

## 2020-10-06 DIAGNOSIS — R519 Headache, unspecified: Secondary | ICD-10-CM

## 2020-10-10 ENCOUNTER — Encounter: Payer: Self-pay | Admitting: Physical Medicine and Rehabilitation

## 2020-10-23 ENCOUNTER — Ambulatory Visit
Admission: RE | Admit: 2020-10-23 | Discharge: 2020-10-23 | Disposition: A | Discharge status: Home / Self Care | Payer: Medicare Other | Source: Ambulatory Visit | Attending: Family Medicine | Admitting: Family Medicine

## 2020-10-23 DIAGNOSIS — R519 Headache, unspecified: Secondary | ICD-10-CM

## 2020-11-25 ENCOUNTER — Other Ambulatory Visit: Payer: Self-pay | Admitting: Family Medicine

## 2020-11-26 ENCOUNTER — Encounter: Payer: Self-pay | Admitting: Physical Medicine and Rehabilitation

## 2020-11-26 ENCOUNTER — Other Ambulatory Visit: Payer: Self-pay

## 2020-11-26 ENCOUNTER — Encounter
Payer: Medicare Other | Attending: Physical Medicine and Rehabilitation | Admitting: Physical Medicine and Rehabilitation

## 2020-11-26 VITALS — BP 132/79 | HR 72 | Temp 98.3°F | Ht 63.0 in | Wt 185.0 lb

## 2020-11-26 DIAGNOSIS — G8929 Other chronic pain: Secondary | ICD-10-CM

## 2020-11-26 DIAGNOSIS — M25562 Pain in left knee: Secondary | ICD-10-CM | POA: Diagnosis present

## 2020-11-26 DIAGNOSIS — M47816 Spondylosis without myelopathy or radiculopathy, lumbar region: Secondary | ICD-10-CM | POA: Diagnosis not present

## 2020-11-26 MED ORDER — LIDOCAINE 5 % EX PTCH: 1.0000 | MEDICATED_PATCH | CUTANEOUS | 2 refills | Status: DC

## 2020-11-26 NOTE — Telephone Encounter (Signed)
Susan Haney. NTBS 6 mos ckup was to be April. Mail order not sent

## 2020-11-26 NOTE — Telephone Encounter (Signed)
Left message for patient to call back to schedule a visit for medication refill

## 2020-11-26 NOTE — Patient Instructions (Addendum)
  1) Ginger (especially studied for arthritis)- reduce leukotriene production to decrease inflammation 2) Blueberries- high in phytonutrients that decrease inflammation 3) Salmon- marine omega-3s reduce joint swelling and pain 4) Pumpkin seeds- reduce inflammation 5) dark chocolate- reduces inflammation 6) turmeric- reduces inflammation 7) tart cherries - reduce pain and stiffness 8) extra virgin olive oil - its compound olecanthal helps to block prostaglandins  9) chili peppers- can be eaten or applied topically via capsaicin 10) mint- helpful for headache, muscle aches, joint pain, and itching 11) garlic- reduces inflammation  Link to further information on diet for chronic pain: [email or web address]  Blue emu oil

## 2020-11-26 NOTE — Progress Notes (Addendum)
Subjective:    Patient ID: Susan Haney, female    DOB: Mar 10, 1932, 87 y.o.   MRN: [id number]  HPI  Susan Haney is an 87 year old woman who presents to establish care for diffuse pain.  1) Left knee pain: -has had surgery due to crush injury of her bones. -she takes tylenol not very often -she has kidney issues- was on naprosyn  -has been using a lidocaine spray that gives some relief -uses a cane because the left knee buckles.   2) Back pain: -has had for years and is getting worse and worse -reviewed lumbar MRI from 2016 and shows   3) Right hip pain: -cannot stand for long without it hurting -found herself leaning to the right in church.    Pain Inventory Average Pain 7 Pain Right Now 7 My pain is constant, burning, dull, tingling and aching  In the last 24 hours, has pain interfered with the following? General activity 7 Relation with others 4 Enjoyment of life 6 What TIME of day is your pain at its worst? morning , daytime, evening, night and varies Sleep (in general) Fair  Pain is worse with: walking, bending, sitting, inactivity, standing and some activites Pain improves with: rest and medication Relief from Meds: Good with Tylenol  Family History  Problem Relation Age of Onset  . Prostate cancer Father 13  . Arthritis Brother        back issues and knee   . Diabetes Maternal Grandmother   . Heart attack Maternal Grandfather   . Liver cancer Sister   . Cancer Other 42       possibly pancreatic  . Cancer Other        possibly uterine or ovarian  . Heart disease Son   . Diabetes Son   . Arthritis Son   . Hypertension Son   . Hyperlipidemia Son    Social History   Socioeconomic History  . Marital status: Widowed    Spouse name: Not on file  . Number of children: 2  . Years of education: Not on file  . Highest education level: Not on file  Occupational History  . Not on file  Tobacco Use  . Smoking status: Never Smoker  . Smokeless tobacco:  Never Used  Vaping Use  . Vaping Use: Never used  Substance and Sexual Activity  . Alcohol use: No    Alcohol/week: 0.0 standard drinks  . Drug use: No  . Sexual activity: Not Currently  Other Topics Concern  . Not on file  Social History Narrative  . Not on file   Social Determinants of Health   Financial Resource Strain: Not on file  Food Insecurity: Not on file  Transportation Needs: Not on file  Physical Activity: Not on file  Stress: Not on file  Social Connections: Not on file   Past Surgical History:  Procedure Laterality Date  . BUNIONECTOMY    . CARPAL TUNNEL RELEASE Right 05/30/2018   Procedure: RIGHT CARPAL TUNNEL RELEASE;  Surgeon: Daryll Brod, MD;  Location: [facility];  Service: Orthopedics;  Laterality: Right;  . COLONOSCOPY N/A 04/04/2014   Procedure: COLONOSCOPY;  Surgeon: Rogene Houston, MD;  Location: [facility];  Service: Endoscopy;  Laterality: N/A;  100  . EYE SURGERY     bil cataracts and lens implants  . FOOT SURGERY     Left  . Ovary removed: benign    . REPLACEMENT TOTAL KNEE BILATERAL  2013  . ROBOTIC ASSISTED TOTAL HYSTERECTOMY WITH BILATERAL SALPINGO OOPHERECTOMY Left 04/13/2016   Procedure: XI ROBOTIC ASSISTED TOTAL HYSTERECTOMY WITH LEFT SALPINGO OOPHORECTOMY SENTINEL LYMPH NODE BIOPSY;  Surgeon: Everitt Amber, MD;  Location: [facility];  Service: Gynecology;  Laterality: Left;  . TOTAL KNEE REVISION Left 02/12/2016   Procedure: LEFT TOTAL KNEE REVISION;  Surgeon: Rod Can, MD;  Location: [facility];  Service: Orthopedics;  Laterality: Left;   Past Surgical History:  Procedure Laterality Date  . BUNIONECTOMY    . CARPAL TUNNEL RELEASE Right 05/30/2018   Procedure: RIGHT CARPAL TUNNEL RELEASE;  Surgeon: Daryll Brod, MD;  Location: [place];  Service: Orthopedics;  Laterality: Right;  . COLONOSCOPY N/A 04/04/2014   Procedure: COLONOSCOPY;  Surgeon: Rogene Houston, MD;  Location: [facility];  Service:  Endoscopy;  Laterality: N/A;  100  . EYE SURGERY     bil cataracts and lens implants  . FOOT SURGERY     Left  . Ovary removed: benign    . REPLACEMENT TOTAL KNEE BILATERAL     2013  . ROBOTIC ASSISTED TOTAL HYSTERECTOMY WITH BILATERAL SALPINGO OOPHERECTOMY Left 04/13/2016   Procedure: XI ROBOTIC ASSISTED TOTAL HYSTERECTOMY WITH LEFT SALPINGO OOPHORECTOMY SENTINEL LYMPH NODE BIOPSY;  Surgeon: Everitt Amber, MD;  Location: [facility];  Service: Gynecology;  Laterality: Left;  . TOTAL KNEE REVISION Left 02/12/2016   Procedure: LEFT TOTAL KNEE REVISION;  Surgeon: Rod Can, MD;  Location: [facility];  Service: Orthopedics;  Laterality: Left;   Past Medical History:  Diagnosis Date  . Anemia    history ,  took iron for years  . Arthritis   . Bicuspid aortic valve   . Cancer ([place]) 2017   endometrial cancer / cervical  . Cellulitis 12/31/2017   BLE, ankles  . Chronic back pain   . DDD (degenerative disc disease), lumbar   . Endometrial cancer ([place])   . GERD (gastroesophageal reflux disease)   . History of radiation therapy 06/15/16-07/06/16   vaginal cuff 30 Gy in 5 fractions  . Hypertension   . Prediabetes   . Spondylosis    lumbar   Ht 5\' 3"  (1.6 m)   BMI 33.37 kg/m   Opioid Risk Score:   Fall Risk Score:  `1  Depression screen PHQ 2/9  Depression screen [facility] 2/9 05/16/2020 02/07/2020 01/23/2020 11/08/2019 08/06/2019 07/04/2017 01/27/2017  Decreased Interest 0 0 0 0 0 0 0  Down, Depressed, Hopeless 0 0 0 0 0 0 0  PHQ - 2 Score 0 0 0 0 0 0 0    Review of Systems  Musculoskeletal: Positive for back pain and gait problem.       Pain in both hands, wrists, knees legs & knees  All other systems reviewed and are negative.      Objective:   Physical Exam  Gen: no distress, normal appearing HEENT: oral mucosa pink and moist, NCAT Cardio: Reg rate Chest: normal effort, normal rate of breathing Abd: soft, non-distended Ext: no edema Psych: pleasant, normal affect Skin:  intact Neuro: Alert and oriented x3.  Musculoskeletal: Analgic gait with cane.     Assessment & Plan:  1) Chronic Pain Syndrome secondary to left knee pain s/p surgery.  -Discussed current symptoms of pain and history of pain.  -Discussed benefits of exercise in reducing pain. -Blue emu oil -Lidocaine patch prescribed for left knee pain -Discussed following foods that may reduce pain: 1) Ginger (especially studied for arthritis)- reduce leukotriene production to  decrease inflammation 2) Blueberries- high in phytonutrients that decrease inflammation 3) Salmon- marine omega-3s reduce joint swelling and pain 4) Pumpkin seeds- reduce inflammation 5) dark chocolate- reduces inflammation 6) turmeric- reduces inflammation 7) tart cherries - reduce pain and stiffness 8) extra virgin olive oil - its compound olecanthal helps to block prostaglandins  9) chili peppers- can be eaten or applied topically via capsaicin 10) mint- helpful for headache, muscle aches, joint pain, and itching 11) garlic- reduces inflammation  Link to further information on diet for chronic pain: [email or web address]  2) Low back pain: -MRI reviewed with patient and shows lumbar spondylosis.  -no injections as they have not provided her relief.

## 2020-11-27 LAB — HM DIABETES EYE EXAM

## 2020-12-02 ENCOUNTER — Ambulatory Visit (INDEPENDENT_AMBULATORY_CARE_PROVIDER_SITE_OTHER): Payer: Medicare Other | Admitting: Internal Medicine

## 2020-12-04 ENCOUNTER — Other Ambulatory Visit (INDEPENDENT_AMBULATORY_CARE_PROVIDER_SITE_OTHER): Payer: Self-pay | Admitting: Internal Medicine

## 2020-12-05 ENCOUNTER — Other Ambulatory Visit (INDEPENDENT_AMBULATORY_CARE_PROVIDER_SITE_OTHER): Payer: Self-pay | Admitting: Internal Medicine

## 2020-12-05 MED ORDER — LUBIPROSTONE 8 MCG PO CAPS: 8.0000 ug | ORAL_CAPSULE | Freq: Two times a day (BID) | ORAL | 3 refills | Status: DC

## 2020-12-05 NOTE — Telephone Encounter (Signed)
Last seen 11/26/2019 for constipation by Dr. Laural Golden.

## 2020-12-10 ENCOUNTER — Telehealth: Payer: Self-pay

## 2020-12-10 ENCOUNTER — Telehealth: Payer: Self-pay | Admitting: *Deleted

## 2020-12-10 NOTE — Telephone Encounter (Signed)
CALLED PATIENT TO INFORM OF FU WITH DR. Denman George ON 01-28-21- ARRIVAL TIME- 1 PM, LVM FOR A RETURN CALL

## 2020-12-10 NOTE — Telephone Encounter (Signed)
Enid Derry from Radiation called to setup appointment for July. She will notify patient of appointment details.

## 2021-01-20 ENCOUNTER — Ambulatory Visit (INDEPENDENT_AMBULATORY_CARE_PROVIDER_SITE_OTHER): Payer: Medicare Other | Admitting: Internal Medicine

## 2021-01-20 ENCOUNTER — Other Ambulatory Visit: Payer: Self-pay

## 2021-01-20 ENCOUNTER — Encounter (INDEPENDENT_AMBULATORY_CARE_PROVIDER_SITE_OTHER): Payer: Self-pay | Admitting: Internal Medicine

## 2021-01-20 VITALS — BP 132/71 | HR 68 | Temp 98.8°F | Ht 63.0 in | Wt 181.0 lb

## 2021-01-20 DIAGNOSIS — L290 Pruritus ani: Secondary | ICD-10-CM | POA: Diagnosis not present

## 2021-01-20 DIAGNOSIS — K5900 Constipation, unspecified: Secondary | ICD-10-CM | POA: Diagnosis not present

## 2021-01-20 MED ORDER — NYSTATIN-TRIAMCINOLONE 100000-0.1 UNIT/GM-% EX CREA: 1.0000 "application " | TOPICAL_CREAM | Freq: Two times a day (BID) | CUTANEOUS | 1 refills | Status: DC

## 2021-01-20 NOTE — Patient Instructions (Addendum)
Take Amitiza/lubiprostone 8 mcg every day.   Continue high-fiber diet as discussed.  Use walnuts and almonds as snacks daily. Use Dulcolax suppository every other day as needed. Mycolog-II cream on as-needed basis. Please call office with progress report in 4 to 8 weeks.

## 2021-01-20 NOTE — Progress Notes (Signed)
Presenting complaint;  Follow for chronic constipation.  Database and subjective:  Patient is 87 year old Caucasian female who has chronic constipation and is here for scheduled visit.  She was last seen by me on 11/26/2019.  She felt she was not getting good results with Amitiza/lubiprostone.  She was given samples of Trulance/plecanatide but it did not work either.  She is advised to go back on Amitiza but take 16 mcg daily. Patient has been taking anywhere from 8 to 16 mcg of Amitiza daily or every other day. She is not getting desired results.  She says when she takes 16 mcg of Amitiza she has watery stools she may have 3 or 4 in the morning.  She feels drained out.  She has tried 8 mcg but it does not work to her satisfaction.  She states she took a dose 3 days ago.  She passed small amount of stool Sunday and yesterday and she states this morning she had a normal stool.  She denies abdominal pain melena or rectal bleeding nausea or vomiting.  Her appetite is good.  She has lost 4 pounds since her last visit.  She does report perianal irritation when her stools are loose.  She had colonoscopy in September 2015 which revealed single left-sided diverticulum and small external hemorrhoids.   Current Medications: Outpatient Encounter Medications as of 01/20/2021  Medication Sig   aspirin EC 81 MG tablet Take 81 mg by mouth daily.   Biotin 1000 MCG tablet Take 1,000 mcg by mouth daily.   cetirizine (ZYRTEC) 10 MG tablet TAKE 1 TABLET BY MOUTH EVERY DAY   cholecalciferol (VITAMIN D) 400 UNITS TABS tablet Take 2,000 Units by mouth.   dapagliflozin propanediol (FARXIGA) 10 MG TABS tablet Take 1 tablet by mouth daily.   docusate sodium (COLACE) 100 MG capsule Take 100 mg by mouth 2 (two) times daily.   fluticasone (FLONASE) 50 MCG/ACT nasal spray SPRAY 2 SPRAYS INTO EACH NOSTRIL EVERY DAY   glucose blood (ACCU-CHEK AVIVA PLUS) test strip Use to check Blood Sugars 1-2 times daily. Dx E11.9    hydrochlorothiazide (HYDRODIURIL) 25 MG tablet TAKE ONE-HALF TABLET BY  MOUTH DAILY   lidocaine (LIDODERM) 5 % Place 1 patch onto the skin daily. Remove & Discard patch within 12 hours or as directed by MD   lisinopril-hydrochlorothiazide (ZESTORETIC) 20-12.5 MG tablet TAKE 1 TABLET BY MOUTH  DAILY   lubiprostone (AMITIZA) 8 MCG capsule Take 1 capsule (8 mcg total) by mouth 2 (two) times daily with a meal. (Patient taking differently: Take 8 mcg by mouth daily.)   Misc Natural Products (TART CHERRY ADVANCED) CAPS Take by mouth daily.    montelukast (SINGULAIR) 10 MG tablet TAKE 1 TABLET BY MOUTH EVERYDAY AT BEDTIME (Patient taking differently: Take 10 mg by mouth as needed.)   Multiple Vitamins-Minerals (MULTIVITAMIN WITH MINERALS) tablet Take 1 tablet by mouth daily.   Omega-3 Fatty Acids (MAXEPA PO) Take 500 mg by mouth daily at 6 (six) AM.   omeprazole (PRILOSEC) 20 MG capsule TAKE 1 CAPSULE BY MOUTH  DAILY   psyllium (METAMUCIL) 58.6 % packet Take 1 packet by mouth daily.   triamcinolone cream (KENALOG) 0.5 % SMARTSIG:1 Sparingly Topical 3 Times Daily   polyethylene glycol (MIRALAX / GLYCOLAX) 17 g packet Take 1 packet by mouth daily. (Patient not taking: Reported on 01/20/2021)   [DISCONTINUED] gabapentin (NEURONTIN) 100 MG capsule Take by mouth at bedtime. (Patient not taking: Reported on 11/26/2020)   [DISCONTINUED] nystatin cream (MYCOSTATIN) Apply 1  application topically 2 (two) times daily.   No facility-administered encounter medications on file as of 01/20/2021.    Objective: Blood pressure 132/71, pulse 68, temperature 98.8 F (37.1 C), temperature source Oral, height '5\' 3"'  (1.6 m), weight 181 lb (82.1 kg). Patient is alert and in no acute distress. She is wearing a mask. Conjunctiva is pink. Sclera is nonicteric Oropharyngeal mucosa is normal. No neck masses or thyromegaly noted. Cardiac exam with regular rhythm normal S1 and S2. No murmur or gallop noted. Lungs are clear to  auscultation. Abdomen is full but soft and nontender with organomegaly or masses. No LE edema or clubbing noted.  Labs/studies Results:   CBC Latest Ref Rng & Units 05/16/2020 11/08/2019 08/06/2019  WBC 3.4 - 10.8 x10E3/uL 7.5 8.4 8.5  Hemoglobin 11.1 - 15.9 g/dL 11.7 11.7 11.6  Hematocrit 34.0 - 46.6 % 36.0 36.5 35.4  Platelets 150 - 450 x10E3/uL 274 261 299    CMP Latest Ref Rng & Units 05/16/2020 02/07/2020 12/20/2019  Glucose 65 - 99 mg/dL 88 112(H) 108(H)  BUN 8 - 27 mg/dL '25 25 17  ' Creatinine 0.57 - 1.00 mg/dL 1.22(H) 1.30(H) 1.09(H)  Sodium 134 - 144 mmol/L 142 141 141  Potassium 3.5 - 5.2 mmol/L 4.1 4.2 4.0  Chloride 96 - 106 mmol/L 106 104 104  CO2 20 - 29 mmol/L '21 23 22  ' Calcium 8.7 - 10.3 mg/dL 9.2 9.1 9.3  Total Protein 6.0 - 8.5 g/dL 6.1 6.2 -  Total Bilirubin 0.0 - 1.2 mg/dL 0.2 0.3 -  Alkaline Phos 44 - 121 IU/L 52 55 -  AST 0 - 40 IU/L 11 15 -  ALT 0 - 32 IU/L 9 9 -    Hepatic Function Latest Ref Rng & Units 05/16/2020 02/07/2020 11/08/2019  Total Protein 6.0 - 8.5 g/dL 6.1 6.2 6.4  Albumin 3.6 - 4.6 g/dL 4.0 3.8 4.1  AST 0 - 40 IU/L '11 15 13  ' ALT 0 - 32 IU/L '9 9 10  ' Alk Phosphatase 44 - 121 IU/L 52 55 50  Total Bilirubin 0.0 - 1.2 mg/dL 0.2 0.3 0.3     Assessment:  #1.  Chronic constipation.  She is on low-dose Amitiza/lubiprostone and not having desirable results.  While 8 mcg dose does not work she gets diarrhea with 16 mcg.  Unfortunately she cannot take 12 mcg as that dose is not available.  She could further increase fiber in diet and consider eating almonds and walnuts daily and see if it helps.  She should also consider using Dulcolax suppository so that she would not go more than 1 day without a bowel movement.  #2.  Perianal irritation secondary to diarrhea.  Plan:  Patient advised to take Amitiza/lubiprostone 8 mcg by mouth every morning. Continue Colace 200 mg by mouth daily. Continue high-fiber diet and remember to drink an extra glass or 2 of  water. 8 walnuts and almonds as snacks every day. Use Dulcolax suppository every other day on as-needed basis. Mycolog-II cream to be applied to perineal area twice daily for 1 week and thereafter as needed.  Prescription given for 30 g and 1 refill. Patient will call with progress report in 4 to 8 weeks. Office visit in 6 months.

## 2021-01-27 NOTE — Progress Notes (Signed)
Follow-up Note: Endometrial cancer   Susan Haney 87 y.o. female  Chief Complaint  Patient presents with   Endometrial cancer [facility])   history of endometrial cancer  Assessment : 87 year old woman with stage IB grade 2 endometrioid endometrial adenocarcinoma with high/intermediate risk factors and microsatellite instability on routine tumor testing, Lynch syndrome negative. s/p vaginal brachytherapy (completed December, 2017). No evidence for recurrence.     Plan:  I recommend she follow-up for one more scheduled scheduled surveillance visit with Dr Sondra Come in January, 2023. At that point she will have completed 5 years of surveillance and can assume follow-up on a prn basis.   HPI: The patient had onset of postmenopausal bleeding approximately a month ago. This was not  associated with any pain or other symptoms. An endometrial biopsy was obtained showing a grade 1-2 endometrial adenocarcinoma. Pelvic ultrasound showed the uterus to measure 7 x 4.4 x 5.6 cm with 2 small fibroids. The left ovary surgically absent right ovary appeared normal there is no free fluid.  Her original surgery date had to be postponed when she acutely fell and fractured her left distal femur assoicated with a knee replacement on 02/12/16. She required emergent repair and replacement and had been in rehab since that time.   On 04/13/16 we performed robotic assisted total hysterectomy, BSO, bilateral SLN biopsy. Final pathology revealed a 4.3cm FIGO grade 2 tumor with 10 of 43m myometrial invasion, negative LVSI and cervical stromal involvement. Bilateral SLN's were negative. 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 Genetic testing negative.  She went on to receive vaginal brachytherapy in accordance with NCCN guidelines: 06/15/16-07/06/16 30 Gy in 5 fractions to the vaginal cuff.  Interval Hx: She has no vaginal bleeding or symptoms concerning for recurrence. She predominantly reported  knee and back pain issues  Review of Systems:10 point review of systems is negative except as noted in interval history. + knee and back pain, + peripheral edema.   Vitals: Blood pressure 128/83, pulse 70, temperature 98.7 F (37.1 C), temperature source Tympanic, resp. rate 18, height 5' 3" (1.6 m), weight 179 lb (81.2 kg), SpO2 97 %.  Physical Exam: General : The patient is a healthy woman in no acute distress.  HEENT: normocephalic, extraoccular movements normal; neck is supple without thyromegally  Lynphnodes: Supraclavicular and inguinal nodes not enlarged  Abdomen: Soft, non-tender, no ascites, no organomegally, no masses, no hernias. Incisions well healed.  Pelvic:  EGBUS: Normal female (atrophic) Vagina: Normal, atrophic, no lesions. Cuff smooth and in tact with no blood. Urethra and Bladder: Normal, non-tender  Cervix & uterus surgically absent. Bi-manual examination: cuff in tact. No lesions. + vaginal prolapse/relaxation consistent with rectocele Rectal: normal sphincter tone, no masses, no blood  Lower extremities: incision healing normally on left leg/knee with no signs of infection. Can flex to 110 degrees. 2+ edema bilaterally   No Known Allergies  Past Medical History:  Diagnosis Date   Anemia    history ,  took iron for years   Arthritis    Bicuspid aortic valve    [place]   endometrial cancer / cervical   Cellulitis 12/31/2017   BLE, ankles   Chronic back pain    DDD (degenerative disc disease), lumbar    [place]    GERD (gastroesophageal reflux disease)    History of radiation therapy 06/15/16-07/06/16   vaginal cuff 30 Gy in 5 fractions   Hypertension    Prediabetes  Spondylosis    lumbar    Past Surgical History:  Procedure Laterality Date   BUNIONECTOMY     CARPAL TUNNEL RELEASE Right 05/30/2018   Procedure: RIGHT CARPAL TUNNEL RELEASE;  Surgeon: Daryll Brod, MD;  Location: [facility];  Service:  Orthopedics;  Laterality: Right;   COLONOSCOPY N/A 04/04/2014   Procedure: COLONOSCOPY;  Surgeon: Rogene Houston, MD;  [facility];  Service: Endoscopy;  Laterality: N/A;  100   EYE SURGERY     bil cataracts and lens implants   FOOT SURGERY     Left   Ovary removed: benign     REPLACEMENT TOTAL KNEE BILATERAL     2013   ROBOTIC ASSISTED TOTAL HYSTERECTOMY WITH BILATERAL SALPINGO OOPHERECTOMY Left 04/13/2016   Procedure: XI ROBOTIC ASSISTED TOTAL HYSTERECTOMY WITH LEFT SALPINGO OOPHORECTOMY SENTINEL LYMPH NODE BIOPSY;  Surgeon: Everitt Amber, MD;  [facility];  Service: Gynecology;  Laterality: Left;   TOTAL KNEE REVISION Left 02/12/2016   Procedure: LEFT TOTAL KNEE REVISION;  Surgeon: Rod Can, MD;  [facility];  Service: Orthopedics;  Laterality: Left;    Current Outpatient Medications  Medication Sig Dispense Refill   aspirin EC 81 MG tablet Take 81 mg by mouth daily.     Biotin 1000 MCG tablet Take 1,000 mcg by mouth daily.     cetirizine (ZYRTEC) 10 MG tablet TAKE 1 TABLET BY MOUTH EVERY DAY 90 tablet 0   cholecalciferol (VITAMIN D) 400 UNITS TABS tablet Take 2,000 Units by mouth.     dapagliflozin propanediol (FARXIGA) 10 MG TABS tablet Take 1 tablet by mouth daily.     docusate sodium (COLACE) 100 MG capsule Take 100 mg by mouth 2 (two) times daily.     fluticasone (FLONASE) 50 MCG/ACT nasal spray SPRAY 2 SPRAYS INTO EACH NOSTRIL EVERY DAY 48 mL 2   glucose blood (ACCU-CHEK AVIVA PLUS) test strip Use to check Blood Sugars 1-2 times daily. Dx E11.9 100 each 12   hydrochlorothiazide (HYDRODIURIL) 25 MG tablet TAKE ONE-HALF TABLET BY  MOUTH DAILY 45 tablet 0   lidocaine (LIDODERM) 5 % Place 1 patch onto the skin daily. Remove & Discard patch within 12 hours or as directed by MD 30 patch 2   lisinopril-hydrochlorothiazide (ZESTORETIC) 20-12.5 MG tablet TAKE 1 TABLET BY MOUTH  DAILY 90 tablet 0   lubiprostone (AMITIZA) 8 MCG capsule Take 1 capsule (8 mcg total) by  mouth 2 (two) times daily with a meal. (Patient taking differently: Take 8 mcg by mouth daily.) 180 capsule 3   Misc Natural Products (TART CHERRY ADVANCED) CAPS Take by mouth daily.      montelukast (SINGULAIR) 10 MG tablet TAKE 1 TABLET BY MOUTH EVERYDAY AT BEDTIME (Patient taking differently: Take 10 mg by mouth as needed.) 90 tablet 0   Multiple Vitamins-Minerals (MULTIVITAMIN WITH MINERALS) tablet Take 1 tablet by mouth daily.     nystatin-triamcinolone (MYCOLOG II) cream Apply 1 application topically 2 (two) times daily. 30 g 1   Omega-3 Fatty Acids (MAXEPA PO) Take 500 mg by mouth daily at 6 (six) AM.     omeprazole (PRILOSEC) 20 MG capsule TAKE 1 CAPSULE BY MOUTH  DAILY 90 capsule 0   psyllium (METAMUCIL) 58.6 % packet Take 1 packet by mouth daily.     triamcinolone cream (KENALOG) 0.5 % SMARTSIG:1 Sparingly Topical 3 Times Daily     No current facility-administered medications for this visit.    Social History   Socioeconomic History  Marital status: Widowed    Spouse name: Not on file   Number of children: 2   Years of education: Not on file   Highest education level: Not on file  Occupational History   Not on file  Tobacco Use   Smoking status: Never   Smokeless tobacco: Never  Vaping Use   Vaping Use: Never used  Substance and Sexual Activity   Alcohol use: No    Alcohol/week: 0.0 standard drinks   Drug use: No   Sexual activity: Not Currently  Other Topics Concern   Not on file  Social History Narrative   Not on file   Social Determinants of Health   Financial Resource Strain: Not on file  Food Insecurity: Not on file  Transportation Needs: Not on file  Physical Activity: Not on file  Stress: Not on file  Social Connections: Not on file  Intimate Partner Violence: Not on file    Family History  Problem Relation Age of Onset   Prostate cancer Father 90   Arthritis Brother        back issues and knee    Diabetes Maternal Grandmother    Heart attack  Maternal Grandfather    Liver cancer Sister    Cancer Other 10       possibly pancreatic   Cancer Other        possibly uterine or ovarian   Heart disease Son    Diabetes Son    Arthritis Son    Hypertension Son    Hyperlipidemia Son     Thereasa Solo, MD 01/28/2021, 1:42 PM  CC: Dr Octavio Graves

## 2021-01-28 ENCOUNTER — Inpatient Hospital Stay: Payer: Medicare Other | Attending: Gynecologic Oncology | Admitting: Gynecologic Oncology

## 2021-01-28 ENCOUNTER — Other Ambulatory Visit: Payer: Self-pay

## 2021-01-28 VITALS — BP 128/83 | HR 70 | Temp 98.7°F | Resp 18 | Ht 63.0 in | Wt 179.0 lb

## 2021-01-28 DIAGNOSIS — Z90722 Acquired absence of ovaries, bilateral: Secondary | ICD-10-CM | POA: Insufficient documentation

## 2021-01-28 DIAGNOSIS — Z79899 Other long term (current) drug therapy: Secondary | ICD-10-CM | POA: Insufficient documentation

## 2021-01-28 DIAGNOSIS — Z8542 Personal history of malignant neoplasm of other parts of uterus: Secondary | ICD-10-CM

## 2021-01-28 DIAGNOSIS — I10 Essential (primary) hypertension: Secondary | ICD-10-CM | POA: Insufficient documentation

## 2021-01-28 DIAGNOSIS — K219 Gastro-esophageal reflux disease without esophagitis: Secondary | ICD-10-CM | POA: Insufficient documentation

## 2021-01-28 DIAGNOSIS — C541 Malignant neoplasm of endometrium: Secondary | ICD-10-CM

## 2021-01-28 DIAGNOSIS — Z7984 Long term (current) use of oral hypoglycemic drugs: Secondary | ICD-10-CM | POA: Insufficient documentation

## 2021-01-28 DIAGNOSIS — Q231 Congenital insufficiency of aortic valve: Secondary | ICD-10-CM | POA: Diagnosis not present

## 2021-01-28 DIAGNOSIS — M5136 Other intervertebral disc degeneration, lumbar region: Secondary | ICD-10-CM | POA: Insufficient documentation

## 2021-01-28 DIAGNOSIS — Z7982 Long term (current) use of aspirin: Secondary | ICD-10-CM | POA: Diagnosis not present

## 2021-01-28 DIAGNOSIS — Z90710 Acquired absence of both cervix and uterus: Secondary | ICD-10-CM | POA: Insufficient documentation

## 2021-01-28 DIAGNOSIS — Z923 Personal history of irradiation: Secondary | ICD-10-CM | POA: Diagnosis not present

## 2021-01-28 NOTE — Patient Instructions (Signed)
Please notify Dr Denman George at phone number [phone] if you notice vaginal bleeding, new pelvic or abdominal pains, bloating, feeling full easy, or a change in bladder or bowel function.   Please follow up with Dr Sondra Come in January, 2023. After that time you will have completed cancer surveillance care and do not require routine scheduled gynecologic exams.

## 2021-02-09 ENCOUNTER — Other Ambulatory Visit: Payer: Self-pay | Admitting: Family Medicine

## 2021-02-22 ENCOUNTER — Other Ambulatory Visit: Payer: Self-pay | Admitting: Family Medicine

## 2021-02-22 DIAGNOSIS — N1832 Chronic kidney disease, stage 3b: Secondary | ICD-10-CM

## 2021-03-06 ENCOUNTER — Encounter: Payer: Medicare Other | Admitting: Physical Medicine and Rehabilitation

## 2021-03-06 ENCOUNTER — Ambulatory Visit: Payer: Medicare Other | Admitting: Physical Medicine and Rehabilitation

## 2021-04-22 ENCOUNTER — Telehealth: Payer: Self-pay | Admitting: Family Medicine

## 2021-04-22 MED ORDER — LISINOPRIL-HYDROCHLOROTHIAZIDE 20-25 MG PO TABS: 1.0000 | ORAL_TABLET | Freq: Every day | ORAL | 0 refills | Status: DC

## 2021-04-22 MED ORDER — OMEPRAZOLE 20 MG PO CPDR: 20.0000 mg | DELAYED_RELEASE_CAPSULE | Freq: Every day | ORAL | 0 refills | Status: DC

## 2021-04-22 NOTE — Telephone Encounter (Signed)
  Prescription Request  04/22/2021  Is this a "Controlled Substance" medicine? no Have you seen your PCP in the last 2 weeks? No pt has appt on 10/12 with [facility] needs refill will run out soon  If YES, route message to pool  -  If NO, patient needs to be scheduled for appointment.  What is the name of the medication or equipment?omeprazole (PRILOSEC) 20 MG capsule lisinopril-hydrochlorothiazide (ZESTORETIC) 20-12.5 MG tablet  hydrochlorothiazide (HYDRODIURIL) 25 MG tablet Have you contacted your pharmacy to request a refill?  YES Which pharmacy would you like this sent to? [facility]   Patient notified that their request is being sent to the clinical staff for review and that they should receive a response within 2 business days.

## 2021-04-22 NOTE — Telephone Encounter (Signed)
I have combined the lisinopril-hctz 20-12.5 with HCTZ 25 (taking 1/2 tablet) and sent a new prescription for lisinopril-HCTZ 20-25.   Please let patient know I sent a 30 day supply to [facility] and she will need to keep her upcoming appointment for further refills as she has not been seen in over a year. I cannot send a 90-day supply until her appointment.

## 2021-04-23 NOTE — Telephone Encounter (Signed)
Patient aware and verbalizes understanding. 

## 2021-04-29 DIAGNOSIS — Z23 Encounter for immunization: Secondary | ICD-10-CM | POA: Diagnosis not present

## 2021-05-05 DIAGNOSIS — B351 Tinea unguium: Secondary | ICD-10-CM | POA: Diagnosis not present

## 2021-05-05 DIAGNOSIS — L84 Corns and callosities: Secondary | ICD-10-CM | POA: Diagnosis not present

## 2021-05-05 DIAGNOSIS — M79676 Pain in unspecified toe(s): Secondary | ICD-10-CM | POA: Diagnosis not present

## 2021-05-05 DIAGNOSIS — E1142 Type 2 diabetes mellitus with diabetic polyneuropathy: Secondary | ICD-10-CM | POA: Diagnosis not present

## 2021-05-06 ENCOUNTER — Encounter: Payer: Self-pay | Admitting: Family Medicine

## 2021-05-06 ENCOUNTER — Ambulatory Visit (INDEPENDENT_AMBULATORY_CARE_PROVIDER_SITE_OTHER): Payer: Medicare Other

## 2021-05-06 ENCOUNTER — Ambulatory Visit (INDEPENDENT_AMBULATORY_CARE_PROVIDER_SITE_OTHER): Payer: Medicare Other | Admitting: Family Medicine

## 2021-05-06 ENCOUNTER — Other Ambulatory Visit: Payer: Self-pay

## 2021-05-06 VITALS — BP 135/74 | HR 67 | Temp 98.2°F | Ht 63.0 in | Wt 176.6 lb

## 2021-05-06 DIAGNOSIS — N1832 Chronic kidney disease, stage 3b: Secondary | ICD-10-CM

## 2021-05-06 DIAGNOSIS — I10 Essential (primary) hypertension: Secondary | ICD-10-CM

## 2021-05-06 DIAGNOSIS — M542 Cervicalgia: Secondary | ICD-10-CM

## 2021-05-06 DIAGNOSIS — M1990 Unspecified osteoarthritis, unspecified site: Secondary | ICD-10-CM | POA: Diagnosis not present

## 2021-05-06 DIAGNOSIS — R600 Localized edema: Secondary | ICD-10-CM | POA: Diagnosis not present

## 2021-05-06 DIAGNOSIS — K5900 Constipation, unspecified: Secondary | ICD-10-CM

## 2021-05-06 DIAGNOSIS — Z23 Encounter for immunization: Secondary | ICD-10-CM | POA: Diagnosis not present

## 2021-05-06 DIAGNOSIS — E559 Vitamin D deficiency, unspecified: Secondary | ICD-10-CM | POA: Diagnosis not present

## 2021-05-06 DIAGNOSIS — M8580 Other specified disorders of bone density and structure, unspecified site: Secondary | ICD-10-CM | POA: Diagnosis not present

## 2021-05-06 DIAGNOSIS — R7303 Prediabetes: Secondary | ICD-10-CM

## 2021-05-06 DIAGNOSIS — K219 Gastro-esophageal reflux disease without esophagitis: Secondary | ICD-10-CM | POA: Diagnosis not present

## 2021-05-06 DIAGNOSIS — E1159 Type 2 diabetes mellitus with other circulatory complications: Secondary | ICD-10-CM | POA: Diagnosis not present

## 2021-05-06 DIAGNOSIS — J302 Other seasonal allergic rhinitis: Secondary | ICD-10-CM | POA: Diagnosis not present

## 2021-05-06 DIAGNOSIS — E119 Type 2 diabetes mellitus without complications: Secondary | ICD-10-CM | POA: Diagnosis not present

## 2021-05-06 DIAGNOSIS — I152 Hypertension secondary to endocrine disorders: Secondary | ICD-10-CM | POA: Diagnosis not present

## 2021-05-06 LAB — BAYER DCA HB A1C WAIVED: HB A1C (BAYER DCA - WAIVED): 5.8 % — ABNORMAL HIGH (ref 4.8–5.6)

## 2021-05-06 MED ORDER — CETIRIZINE HCL 10 MG PO TABS: 10.0000 mg | ORAL_TABLET | Freq: Every day | ORAL | 1 refills | Status: DC

## 2021-05-06 MED ORDER — DAPAGLIFLOZIN PROPANEDIOL 10 MG PO TABS: 10.0000 mg | ORAL_TABLET | Freq: Every day | ORAL | 1 refills | Status: DC

## 2021-05-06 MED ORDER — OMEPRAZOLE 20 MG PO CPDR: 20.0000 mg | DELAYED_RELEASE_CAPSULE | Freq: Every day | ORAL | 1 refills | Status: DC

## 2021-05-06 MED ORDER — MONTELUKAST SODIUM 10 MG PO TABS: 10.0000 mg | ORAL_TABLET | Freq: Every day | ORAL | 1 refills | Status: DC

## 2021-05-06 MED ORDER — LISINOPRIL-HYDROCHLOROTHIAZIDE 20-25 MG PO TABS: 1.0000 | ORAL_TABLET | Freq: Every day | ORAL | 1 refills | Status: DC

## 2021-05-06 NOTE — Progress Notes (Signed)
Assessment & Plan:  1. Prediabetes - Bayer DCA Hb A1c Waived, 5.8 today  2. Essential hypertension - Well controlled on current regimen - Lipid panel - CBC with Differential/Platelet - CMP14+EGFR - lisinopril-hydrochlorothiazide (ZESTORETIC) 20-25 MG tablet; Take 1 tablet by mouth daily.  Dispense: 90 tablet; Refill: 1  3. Gastroesophageal reflux disease without esophagitis - Well controlled on current regimen - CMP14+EGFR - omeprazole (PRILOSEC) 20 MG capsule; Take 1 capsule (20 mg total) by mouth daily.  Dispense: 90 capsule; Refill: 1  4. Stage 3b chronic kidney disease ([place]) - referral to CCM for assistance obtaining Farxiga - CMP14+EGFR - dapagliflozin propanediol (FARXIGA) 10 MG TABS tablet; Take 1 tablet (10 mg total) by mouth daily.  Dispense: 90 tablet; Refill: 1  5. Vitamin D deficiency - continue vitamin D supplement - VITAMIN D 25 Hydroxy (Vit-D Deficiency, Fractures)  6. Constipation, unspecified constipation type - Well controlled on current regimen with Amitiza - referral to CCM for assistance obtaining Amitiza - CMP14+EGFR  7. Need for immunization against influenza - influenza given today  8. Neck pain - DG Cervical Spine Complete  9. Seasonal allergies - encouraged to take allergy medications  - montelukast (SINGULAIR) 10 MG tablet; Take 1 tablet (10 mg total) by mouth at bedtime.  Dispense: 90 tablet; Refill: 1 - cetirizine (ZYRTEC) 10 MG tablet; Take 1 tablet (10 mg total) by mouth daily.  Dispense: 90 tablet; Refill: 1  10. Arthritis - continue tylenol as needed  11. Bilateral lower extremity edema - Encouraged to resume wearing compression hose. If burning, tingling and restless legs continue with control of edema, will look at other treatment options for this.  12. Osteopenia - Encouraged to start calcium supplement. Continue vitamin D supplement. Education provided on osteopenia.   Return in about 6 months (around 11/04/2021) for annual  physical.  Lucile Crater, NP Student  I personally was present during the history, physical exam, and medical decision-making activities of this service and have verified that the service and findings are accurately documented in the nurse practitioner student's note.  Hendricks Limes, MSN, APRN, FNP-C [facility]   Subjective:    Patient ID: Susan Haney, female    DOB: March 04, 1932, 87 y.o.   MRN: [id number]  Patient Care Team: Loman Brooklyn, FNP as PCP - General (Family Medicine) Celestia Khat, [place] ([facility])   Chief Complaint:  Chief Complaint  Patient presents with   Prediabetes   Hypertension    Check up of chronic medical conditions    Leg Pain    Ongoing bilateral leg and knee pain. Patient states she is also having burning and stinging with her legs.    Arthritis    HPI: Susan Haney is a 87 y.o. female presenting on 05/06/2021 for Prediabetes, Hypertension (Check up of chronic medical conditions ), Leg Pain (Ongoing bilateral leg and knee pain. Patient states she is also having burning and stinging with her legs. ), and Arthritis  She checks her blood pressure at home and gets readings 117/66. She states she eats a low sodium diet and tries to get exercise. She is taking lisinopril-HCTZ for her blood pressure.   She was diagnosed with prediabetes several years ago and was placed on Farxiga last year due to chronic kidney disease.   She has seasonal allergies and has not been taking allergy medications. She states she has a lot of mucus drainage, some frontal sinus pressure, cough, and ear itchiness.   She states her neck  has reduced ROM and that when she lies back in her recliner she gets a dull headache on the back of her head. She has not had imaging done of her neck. She had a head CT completed in April 2022 which did not reveal any acute abnormality.   She has had ongoing issues with swelling in her lower extremities. She does not wear  compression socks. She states she mostly has swelling around her ankles. She also experiences burning, tingling, and restless legs at night that she is not sure if it is related to her swelling.  She has generalized joint pain she takes tylenol for.    Social history:  Relevant past medical, surgical, family and social history reviewed and updated as indicated. Interim medical history since our last visit reviewed.  Allergies and medications reviewed and updated.  DATA REVIEWED: CHART IN EPIC  ROS: Negative unless specifically indicated above in HPI.    Current Outpatient Medications:    aspirin EC 81 MG tablet, Take 81 mg by mouth daily., Disp: , Rfl:    Biotin 1000 MCG tablet, Take 1,000 mcg by mouth daily., Disp: , Rfl:    cholecalciferol (VITAMIN D) 400 UNITS TABS tablet, Take 2,000 Units by mouth., Disp: , Rfl:    docusate sodium (COLACE) 100 MG capsule, Take 100 mg by mouth 2 (two) times daily., Disp: , Rfl:    hydrochlorothiazide (HYDRODIURIL) 25 MG tablet, Take 0.5 tablets (12.5 mg total) by mouth daily., Disp: 45 tablet, Rfl: 1   lisinopril-hydrochlorothiazide (ZESTORETIC) 20-12.5 MG tablet, Take 1 tablet by mouth daily., Disp: 90 tablet, Rfl: 1   lubiprostone (AMITIZA) 8 MCG capsule, , Disp: , Rfl:    Misc Natural Products (TART CHERRY ADVANCED) CAPS, Take by mouth daily. , Disp: , Rfl:    Omega-3 Fatty Acids (OMEGA 3 500 PO), Take 1 tablet by mouth daily. , Disp: , Rfl:    omeprazole (PRILOSEC) 20 MG capsule, TAKE 1 CAPSULE (20 MG TOTAL) BY MOUTH DAILY., Disp: 90 capsule, Rfl: 1   No Known Allergies Past Medical History:  Diagnosis Date   Anemia    history ,  took iron for years   Arthritis    Bicuspid aortic valve    Cancer ([place]) 2017   endometrial cancer / cervical   Cellulitis 12/31/2017   BLE, ankles   Chronic back pain    DDD (degenerative disc disease), lumbar    Endometrial cancer ([place])    GERD (gastroesophageal reflux disease)    History of radiation  therapy 06/15/16-07/06/16   vaginal cuff 30 Gy in 5 fractions   Hypertension    Prediabetes    Spondylosis    lumbar    Past Surgical History:  Procedure Laterality Date   BUNIONECTOMY     CARPAL TUNNEL RELEASE Right 05/30/2018   Procedure: RIGHT CARPAL TUNNEL RELEASE;  Surgeon: Daryll Brod, MD;  Location: [facility];  Service: Orthopedics;  Laterality: Right;   COLONOSCOPY N/A 04/04/2014   Procedure: COLONOSCOPY;  Surgeon: Rogene Houston, MD;  [facility];  Service: Endoscopy;  Laterality: N/A;  100   EYE SURGERY     bil cataracts and lens implants   FOOT SURGERY     Left   Ovary removed: benign     REPLACEMENT TOTAL KNEE BILATERAL     2013   ROBOTIC ASSISTED TOTAL HYSTERECTOMY WITH BILATERAL SALPINGO OOPHERECTOMY Left 04/13/2016   Procedure: XI ROBOTIC ASSISTED TOTAL HYSTERECTOMY WITH LEFT SALPINGO OOPHORECTOMY SENTINEL LYMPH NODE  BIOPSY;  Surgeon: Everitt Amber, MD;  [facility];  Service: Gynecology;  Laterality: Left;   TOTAL KNEE REVISION Left 02/12/2016   Procedure: LEFT TOTAL KNEE REVISION;  Surgeon: Rod Can, MD;  [facility];  Service: Orthopedics;  Laterality: Left;    Social History   Socioeconomic History   Marital status: Widowed    Spouse name: Not on file   Number of children: 2   Years of education: Not on file   Highest education level: Not on file  Occupational History   Not on file  Tobacco Use   Smoking status: Never   Smokeless tobacco: Never  Vaping Use   Vaping Use: Never used  Substance and Sexual Activity   Alcohol use: No    Alcohol/week: 0.0 standard drinks   Drug use: No   Sexual activity: Not Currently  Other Topics Concern   Not on file  Social History Narrative   Not on file   Social Determinants of Health   Financial Resource Strain: Not on file  Food Insecurity: Not on file  Transportation Needs: Not on file  Physical Activity: Not on file  Stress: Not on file  Social Connections: Not on  file  Intimate Partner Violence: Not on file        Objective:    BP 135/74   Pulse 67   Temp 98.2 F (36.8 C) (Temporal)   Ht '5\' 3"'  (1.6 m)   Wt 80.1 kg   SpO2 99%   BMI 31.28 kg/m   Wt Readings from Last 3 Encounters:  05/06/21 80.1 kg  01/28/21 81.2 kg  01/20/21 82.1 kg    Physical Exam Vitals reviewed.  Constitutional:      General: She is not in acute distress.    Appearance: Normal appearance. She is obese. She is not ill-appearing, toxic-appearing or diaphoretic.  HENT:     Head: Normocephalic and atraumatic.  Eyes:     General: No scleral icterus.       Right eye: No discharge.        Left eye: No discharge.     Conjunctiva/sclera: Conjunctivae normal.  Cardiovascular:     Rate and Rhythm: Normal rate and regular rhythm.     Heart sounds: Normal heart sounds. No murmur heard.   No friction rub. No gallop.  Pulmonary:     Effort: Pulmonary effort is normal. No respiratory distress.     Breath sounds: Normal breath sounds. No stridor. No wheezing, rhonchi or rales.  Musculoskeletal:     Cervical back: Tenderness present. Decreased range of motion.     Right lower leg: 1+ Edema present.     Left lower leg: 1+ Edema present.  Skin:    General: Skin is warm and dry.     Capillary Refill: Capillary refill takes less than 2 seconds.  Neurological:     General: No focal deficit present.     Mental Status: She is alert and oriented to person, place, and time. Mental status is at baseline.  Psychiatric:        Mood and Affect: Mood normal.        Behavior: Behavior normal.        Thought Content: Thought content normal.        Judgment: Judgment normal.    Lab Results  Component Value Date   TSH 3.250 08/06/2019   Lab Results  Component Value Date   WBC 7.5 05/16/2020   HGB 11.7 05/16/2020  HCT 36.0 05/16/2020   MCV 88 05/16/2020   PLT 274 05/16/2020   Lab Results  Component Value Date   NA 142 05/16/2020   K 4.1 05/16/2020   CO2 21 05/16/2020    GLUCOSE 88 05/16/2020   BUN 25 05/16/2020   CREATININE 1.22 (H) 05/16/2020   BILITOT 0.2 05/16/2020   ALKPHOS 52 05/16/2020   AST 11 05/16/2020   ALT 9 05/16/2020   PROT 6.1 05/16/2020   ALBUMIN 4.0 05/16/2020   CALCIUM 9.2 05/16/2020   ANIONGAP 9 05/23/2018   Lab Results  Component Value Date   CHOL 162 05/16/2020   Lab Results  Component Value Date   HDL 50 05/16/2020   Lab Results  Component Value Date   LDLCALC 96 05/16/2020   Lab Results  Component Value Date   TRIG 85 05/16/2020   Lab Results  Component Value Date   CHOLHDL 3.2 05/16/2020   Lab Results  Component Value Date   HGBA1C 5.8 (H) 05/06/2021

## 2021-05-06 NOTE — Patient Instructions (Signed)
Please take over the counter Calcium citrate 1,200 mg for your osteopenia.  Wear compression hose daily - apply in the morning and take off at bedtime.

## 2021-05-07 LAB — CBC WITH DIFFERENTIAL/PLATELET
Basophils Absolute: 0 10*3/uL (ref 0.0–0.2)
Basos: 1 %
EOS (ABSOLUTE): 0.3 10*3/uL (ref 0.0–0.4)
Eos: 5 %
Hematocrit: 36.3 % (ref 34.0–46.6)
Hemoglobin: 12 g/dL (ref 11.1–15.9)
Immature Grans (Abs): 0 10*3/uL (ref 0.0–0.1)
Immature Granulocytes: 0 %
Lymphocytes Absolute: 1.8 10*3/uL (ref 0.7–3.1)
Lymphs: 31 %
MCH: 28 pg (ref 26.6–33.0)
MCHC: 33.1 g/dL (ref 31.5–35.7)
MCV: 85 fL (ref 79–97)
Monocytes Absolute: 0.5 10*3/uL (ref 0.1–0.9)
Monocytes: 9 %
Neutrophils Absolute: 3.3 10*3/uL (ref 1.4–7.0)
Neutrophils: 54 %
Platelets: 277 10*3/uL (ref 150–450)
RBC: 4.29 x10E6/uL (ref 3.77–5.28)
RDW: 13.2 % (ref 11.7–15.4)
WBC: 6 10*3/uL (ref 3.4–10.8)

## 2021-05-07 LAB — CMP14+EGFR
ALT: 6 IU/L (ref 0–32)
AST: 11 IU/L (ref 0–40)
Albumin/Globulin Ratio: 1.8 (ref 1.2–2.2)
Albumin: 4.2 g/dL (ref 3.6–4.6)
Alkaline Phosphatase: 57 IU/L (ref 44–121)
BUN/Creatinine Ratio: 18 (ref 12–28)
BUN: 21 mg/dL (ref 8–27)
Bilirubin Total: 0.3 mg/dL (ref 0.0–1.2)
CO2: 24 mmol/L (ref 20–29)
Calcium: 9.9 mg/dL (ref 8.7–10.3)
Chloride: 105 mmol/L (ref 96–106)
Creatinine, Ser: 1.14 mg/dL — ABNORMAL HIGH (ref 0.57–1.00)
Globulin, Total: 2.3 g/dL (ref 1.5–4.5)
Glucose: 98 mg/dL (ref 70–99)
Potassium: 4.1 mmol/L (ref 3.5–5.2)
Sodium: 143 mmol/L (ref 134–144)
Total Protein: 6.5 g/dL (ref 6.0–8.5)
eGFR: 47 mL/min/{1.73_m2} — ABNORMAL LOW (ref >59)

## 2021-05-07 LAB — LIPID PANEL
Chol/HDL Ratio: 3.3 ratio (ref 0.0–4.4)
Cholesterol, Total: 161 mg/dL (ref 100–199)
HDL: 49 mg/dL (ref >39)
LDL Chol Calc (NIH): 91 mg/dL (ref 0–99)
Triglycerides: 116 mg/dL (ref 0–149)
VLDL Cholesterol Cal: 21 mg/dL (ref 5–40)

## 2021-05-08 LAB — VITAMIN D 25 HYDROXY (VIT D DEFICIENCY, FRACTURES): Vit D, 25-Hydroxy: 47.6 ng/mL (ref 30.0–100.0)

## 2021-05-08 LAB — SPECIMEN STATUS REPORT

## 2021-05-12 ENCOUNTER — Telehealth: Payer: Self-pay

## 2021-05-12 NOTE — Chronic Care Management (AMB) (Signed)
  Chronic Care Management   Note  05/12/2021 Name: Susan Haney MRN: [id number] DOB: [birth year]  Susan Haney is a 87 y.o. year old female who is a primary care patient of Loman Brooklyn, FNP. I reached out to Briscoe Burns by phone today in response to a referral sent by Susan Haney's PCP.  [name] was given information about Chronic Care Management services today including:  CCM service includes personalized support from designated clinical staff supervised by her physician, including individualized plan of care and coordination with other care providers 24/7 contact phone numbers for assistance for urgent and routine care needs. Service will only be billed when office clinical staff spend 20 minutes or more in a month to coordinate care. Only one practitioner may furnish and bill the service in a calendar month. The patient may stop CCM services at any time (effective at the end of the month) by phone call to the office staff. The patient is responsible for co-pay (up to 20% after annual deductible is met) if co-pay is required by the individual health plan.   Patient agreed to services and verbal consent obtained.   Follow up plan: Telephone appointment with care management team member scheduled for:06/09/2021  Noreene Larsson, [place], [place], [place] [address] Direct Dial: [phone] [email or web address]

## 2021-05-16 ENCOUNTER — Other Ambulatory Visit: Payer: Self-pay | Admitting: Family Medicine

## 2021-05-16 DIAGNOSIS — K219 Gastro-esophageal reflux disease without esophagitis: Secondary | ICD-10-CM

## 2021-05-16 DIAGNOSIS — I10 Essential (primary) hypertension: Secondary | ICD-10-CM

## 2021-05-25 ENCOUNTER — Telehealth: Payer: Self-pay | Admitting: Family Medicine

## 2021-05-25 NOTE — Telephone Encounter (Signed)
Left message for patient to call back and schedule Medicare Annual Wellness Visit (AWV) either virtually or in office.   *due 07/19/2009 awvi per [facility] please schedule at anytime with health coach  This should be a 45 minute visit.

## 2021-05-28 ENCOUNTER — Ambulatory Visit (INDEPENDENT_AMBULATORY_CARE_PROVIDER_SITE_OTHER): Payer: Medicare Other

## 2021-05-28 ENCOUNTER — Telehealth: Payer: Self-pay

## 2021-05-28 VITALS — Ht 63.0 in | Wt 176.0 lb

## 2021-05-28 DIAGNOSIS — Z0000 Encounter for general adult medical examination without abnormal findings: Secondary | ICD-10-CM | POA: Diagnosis not present

## 2021-05-28 NOTE — Telephone Encounter (Signed)
Pain in her knees and legs is worsening - She has had surgery on both knees several years ago by Dr Delfino Lovett - would like new referral to see him again. Thanks

## 2021-05-28 NOTE — Patient Instructions (Addendum)
[name] , Thank you for taking time to come for your Medicare Wellness Visit. I appreciate your ongoing commitment to your health goals. Please review the following plan we discussed and let me know if I can assist you in the future.   Screening recommendations/referrals: Colonoscopy: Done 04/04/2014 - no longer required Mammogram: No longer required Bone Density: Done 05/16/2020 - Repeat every 2 years Recommended yearly ophthalmology/optometry visit for glaucoma screening and checkup Recommended yearly dental visit for hygiene and checkup  Vaccinations: Influenza vaccine: Done 05/06/2021 - Repeat annually Pneumococcal vaccine: Done 2010 & 2018 Tdap vaccine: Done 02/11/2016 - Repeat in 10 years  Shingles vaccine: Zostavax done years ago - due for Shingrix   Covid-19: Done 08/01/2019, 08/29/2019, 03/18/2020, 12/31/2020 & 04/29/2021  Advanced directives: in chart  Conditions/risks identified: Keep up the great work!  Next appointment: Follow up in one year for your annual wellness visit    Preventive Care 65 Years and Older, Female Preventive care refers to lifestyle choices and visits with your health care provider that can promote health and wellness. What does preventive care include? A yearly physical exam. This is also called an annual well check. Dental exams once or twice a year. Routine eye exams. Ask your health care provider how often you should have your eyes checked. Personal lifestyle choices, including: Daily care of your teeth and gums. Regular physical activity. Eating a healthy diet. Avoiding tobacco and drug use. Limiting alcohol use. Practicing safe sex. Taking low-dose aspirin every day. Taking vitamin and mineral supplements as recommended by your health care provider. What happens during an annual well check? The services and screenings done by your health care provider during your annual well check will depend on your age, overall health, lifestyle risk  factors, and family history of disease. Counseling  Your health care provider may ask you questions about your: Alcohol use. Tobacco use. Drug use. Emotional well-being. Home and relationship well-being. Sexual activity. Eating habits. History of falls. Memory and ability to understand (cognition). Work and work Statistician. Reproductive health. Screening  You may have the following tests or measurements: Height, weight, and BMI. Blood pressure. Lipid and cholesterol levels. These may be checked every 5 years, or more frequently if you are over 53 years old. Skin check. Lung cancer screening. You may have this screening every year starting at age 23 if you have a 30-pack-year history of smoking and currently smoke or have quit within the past 15 years. Fecal occult blood test (FOBT) of the stool. You may have this test every year starting at age 60. Flexible sigmoidoscopy or colonoscopy. You may have a sigmoidoscopy every 5 years or a colonoscopy every 10 years starting at age 12. Hepatitis C blood test. Hepatitis B blood test. Sexually transmitted disease (STD) testing. Diabetes screening. This is done by checking your blood sugar (glucose) after you have not eaten for a while (fasting). You may have this done every 1-3 years. Bone density scan. This is done to screen for osteoporosis. You may have this done starting at age 75. Mammogram. This may be done every 1-2 years. Talk to your health care provider about how often you should have regular mammograms. Talk with your health care provider about your test results, treatment options, and if necessary, the need for more tests. Vaccines  Your health care provider may recommend certain vaccines, such as: Influenza vaccine. This is recommended every year. Tetanus, diphtheria, and acellular pertussis (Tdap, Td) vaccine. You may need a Td booster every  10 years. Zoster vaccine. You may need this after age 69. Pneumococcal 13-valent  conjugate (PCV13) vaccine. One dose is recommended after age 60. Pneumococcal polysaccharide (PPSV23) vaccine. One dose is recommended after age 32. Talk to your health care provider about which screenings and vaccines you need and how often you need them. This information is not intended to replace advice given to you by your health care provider. Make sure you discuss any questions you have with your health care provider. Document Released: 08/01/2015 Document Revised: 03/24/2016 Document Reviewed: 05/06/2015 Elsevier Interactive Patient Education  2017 [place] Prevention in the Home Falls can cause injuries. They can happen to people of all ages. There are many things you can do to make your home safe and to help prevent falls. What can I do on the outside of my home? Regularly fix the edges of walkways and driveways and fix any cracks. Remove anything that might make you trip as you walk through a door, such as a raised step or threshold. Trim any bushes or trees on the path to your home. Use bright outdoor lighting. Clear any walking paths of anything that might make someone trip, such as rocks or tools. Regularly check to see if handrails are loose or broken. Make sure that both sides of any steps have handrails. Any raised decks and porches should have guardrails on the edges. Have any leaves, snow, or ice cleared regularly. Use sand or salt on walking paths during winter. Clean up any spills in your garage right away. This includes oil or grease spills. What can I do in the bathroom? Use night lights. Install grab bars by the toilet and in the tub and shower. Do not use towel bars as grab bars. Use non-skid mats or decals in the tub or shower. If you need to sit down in the shower, use a plastic, non-slip stool. Keep the floor dry. Clean up any water that spills on the floor as soon as it happens. Remove soap buildup in the tub or shower regularly. Attach bath mats  securely with double-sided non-slip rug tape. Do not have throw rugs and other things on the floor that can make you trip. What can I do in the bedroom? Use night lights. Make sure that you have a light by your bed that is easy to reach. Do not use any sheets or blankets that are too big for your bed. They should not hang down onto the floor. Have a firm chair that has side arms. You can use this for support while you get dressed. Do not have throw rugs and other things on the floor that can make you trip. What can I do in the kitchen? Clean up any spills right away. Avoid walking on wet floors. Keep items that you use a lot in easy-to-reach places. If you need to reach something above you, use a strong step stool that has a grab bar. Keep electrical cords out of the way. Do not use floor polish or wax that makes floors slippery. If you must use wax, use non-skid floor wax. Do not have throw rugs and other things on the floor that can make you trip. What can I do with my stairs? Do not leave any items on the stairs. Make sure that there are handrails on both sides of the stairs and use them. Fix handrails that are broken or loose. Make sure that handrails are as long as the stairways. Check any carpeting to make  sure that it is firmly attached to the stairs. Fix any carpet that is loose or worn. Avoid having throw rugs at the top or bottom of the stairs. If you do have throw rugs, attach them to the floor with carpet tape. Make sure that you have a light switch at the top of the stairs and the bottom of the stairs. If you do not have them, ask someone to add them for you. What else can I do to help prevent falls? Wear shoes that: Do not have high heels. Have rubber bottoms. Are comfortable and fit you well. Are closed at the toe. Do not wear sandals. If you use a stepladder: Make sure that it is fully opened. Do not climb a closed stepladder. Make sure that both sides of the stepladder  are locked into place. Ask someone to hold it for you, if possible. Clearly mark and make sure that you can see: Any grab bars or handrails. First and last steps. Where the edge of each step is. Use tools that help you move around (mobility aids) if they are needed. These include: Canes. Walkers. Scooters. Crutches. Turn on the lights when you go into a dark area. Replace any light bulbs as soon as they burn out. Set up your furniture so you have a clear path. Avoid moving your furniture around. If any of your floors are uneven, fix them. If there are any pets around you, be aware of where they are. Review your medicines with your doctor. Some medicines can make you feel dizzy. This can increase your chance of falling. Ask your doctor what other things that you can do to help prevent falls. This information is not intended to replace advice given to you by your health care provider. Make sure you discuss any questions you have with your health care provider. Document Released: 05/01/2009 Document Revised: 12/11/2015 Document Reviewed: 08/09/2014 Elsevier Interactive Patient Education  2017 [employer].

## 2021-05-28 NOTE — Telephone Encounter (Signed)
Please schedule an appointment, even if it is a telephone visit.

## 2021-05-28 NOTE — Progress Notes (Signed)
Subjective:   Susan Haney is a 87 y.o. female who presents for Medicare Annual (Subsequent) preventive examination.  Review of Systems     Cardiac Risk Factors include: advanced age (>79men, >33 women);obesity (BMI >30kg/m2);dyslipidemia;hypertension     Objective:    Today's Vitals   05/28/21 1529  Weight: 176 lb (79.8 kg)  Height: 5\' 3"  (1.6 m)  PainSc: 7    Body mass index is 31.18 kg/m.  Advanced Directives 05/28/2021 07/24/2020 07/23/2019 07/24/2018 05/30/2018 05/22/2018 04/17/2018  Does Patient Have a Medical Advance Directive? Yes Yes Yes Yes Yes Yes Yes  Type of Paramedic of [place];Living will [place];Living will Living will [place];Living will [place];Living will - [place]  Does patient want to make changes to medical advance directive? - No - Patient declined - - No - Patient declined - No - Patient declined  Copy of [place] in Chart? No - copy requested Yes - validated most recent copy scanned in chart (See row information) - No - copy requested (No Data) - Yes  Would patient like information on creating a medical advance directive? - - - - - - -    Current Medications (verified) Outpatient Encounter Medications as of 05/28/2021  Medication Sig   aspirin EC 81 MG tablet Take 81 mg by mouth daily.   Biotin 1000 MCG tablet Take 1,000 mcg by mouth daily.   cetirizine (ZYRTEC) 10 MG tablet Take 1 tablet (10 mg total) by mouth daily.   cholecalciferol (VITAMIN D) 400 UNITS TABS tablet Take 2,000 Units by mouth.   dapagliflozin propanediol (FARXIGA) 10 MG TABS tablet Take 1 tablet (10 mg total) by mouth daily.   docusate sodium (COLACE) 100 MG capsule Take 100 mg by mouth 2 (two) times daily.   fluticasone (FLONASE) 50 MCG/ACT nasal spray SPRAY 2 SPRAYS INTO EACH NOSTRIL EVERY DAY   glucose blood (ACCU-CHEK AVIVA PLUS) test strip Use to check  Blood Sugars 1-2 times daily. Dx E11.9   lisinopril-hydrochlorothiazide (ZESTORETIC) 20-25 MG tablet Take 1 tablet by mouth daily.   lubiprostone (AMITIZA) 8 MCG capsule Take 1 capsule (8 mcg total) by mouth 2 (two) times daily with a meal. (Patient taking differently: Take 8 mcg by mouth daily.)   Misc Natural Products (TART CHERRY ADVANCED) CAPS Take by mouth daily.    montelukast (SINGULAIR) 10 MG tablet Take 1 tablet (10 mg total) by mouth at bedtime.   Multiple Vitamins-Minerals (MULTIVITAMIN WITH MINERALS) tablet Take 1 tablet by mouth daily.   nystatin-triamcinolone (MYCOLOG II) cream Apply 1 application topically 2 (two) times daily.   Omega-3 Fatty Acids (MAXEPA PO) Take 500 mg by mouth daily at 6 (six) AM.   omeprazole (PRILOSEC) 20 MG capsule Take 1 capsule (20 mg total) by mouth daily.   psyllium (METAMUCIL) 58.6 % packet Take 1 packet by mouth daily.   triamcinolone cream (KENALOG) 0.5 % SMARTSIG:1 Sparingly Topical 3 Times Daily   No facility-administered encounter medications on file as of 05/28/2021.    Allergies (verified) Patient has no known allergies.   History: Past Medical History:  Diagnosis Date   Anemia    history ,  took iron for years   Arthritis    Bicuspid aortic valve    Cancer ([place]) 2017   endometrial cancer / cervical   Cellulitis 12/31/2017   BLE, ankles   Chronic back pain    DDD (degenerative disc disease),  lumbar    Endometrial cancer ([place])    GERD (gastroesophageal reflux disease)    History of radiation therapy 06/15/16-07/06/16   vaginal cuff 30 Gy in 5 fractions   Hypertension    Prediabetes    Spondylosis    lumbar   Past Surgical History:  Procedure Laterality Date   BUNIONECTOMY     CARPAL TUNNEL RELEASE Right 05/30/2018   Procedure: RIGHT CARPAL TUNNEL RELEASE;  Surgeon: Daryll Brod, MD;  [facility];  Service: Orthopedics;  Laterality: Right;   COLONOSCOPY N/A 04/04/2014   Procedure: COLONOSCOPY;   Surgeon: Rogene Houston, MD;  [facility];  Service: Endoscopy;  Laterality: N/A;  100   EYE SURGERY     bil cataracts and lens implants   FOOT SURGERY     Left   Ovary removed: benign     REPLACEMENT TOTAL KNEE BILATERAL     2013   ROBOTIC ASSISTED TOTAL HYSTERECTOMY WITH BILATERAL SALPINGO OOPHERECTOMY Left 04/13/2016   Procedure: XI ROBOTIC ASSISTED TOTAL HYSTERECTOMY WITH LEFT SALPINGO OOPHORECTOMY SENTINEL LYMPH NODE BIOPSY;  Surgeon: Everitt Amber, MD;  Location[facility];  Service: Gynecology;  Laterality: Left;   TOTAL KNEE REVISION Left 02/12/2016   Procedure: LEFT TOTAL KNEE REVISION;  Surgeon: Rod Can, MD;  Location[facility];  Service: Orthopedics;  Laterality: Left;   Family History  Problem Relation Age of Onset   Prostate cancer Father 54   Arthritis Brother        back issues and knee    Diabetes Maternal Grandmother    Heart attack Maternal Grandfather    Liver cancer Sister    Cancer Other 21       possibly pancreatic   Cancer Other        possibly uterine or ovarian   Heart disease Son    Diabetes Son    Arthritis Son    Hypertension Son    Hyperlipidemia Son    Social History   Socioeconomic History   Marital status: Widowed    Spouse name: Not on file   Number of children: 2   Years of education: Not on file   Highest education level: Not on file  Occupational History   Occupation: retired  Tobacco Use   Smoking status: Never   Smokeless tobacco: Never  Vaping Use   Vaping Use: Never used  Substance and Sexual Activity   Alcohol use: No    Alcohol/week: 0.0 standard drinks   Drug use: No   Sexual activity: Not Currently  Other Topics Concern   Not on file  Social History Narrative   2 sons - in [place] and [place]   Disabled sister lives close by   Social Determinants of Radio broadcast assistant Strain: Medium Risk   Difficulty of Paying Living Expenses: Somewhat hard  Food Insecurity: No Food Insecurity   Worried  About Charity fundraiser in the Last Year: Never true   Arboriculturist in the Last Year: Never true  Transportation Needs: No Transportation Needs   Lack of Transportation (Medical): No   Lack of Transportation (Non-Medical): No  Physical Activity: Sufficiently Active   Days of Exercise per Week: 7 days   Minutes of Exercise per Session: 60 min  Stress: No Stress Concern Present   Feeling of Stress : Only a little  Social Connections: Moderately Isolated   Frequency of Communication with Friends and Family: More than three times a week  Frequency of Social Gatherings with Friends and Family: More than three times a week   Attends Religious Services: More than 4 times per year   Active Member of Clubs or Organizations: No   Attends Archivist Meetings: Never   Marital Status: Widowed    Tobacco Counseling Counseling given: Not Answered   Clinical Intake:  Pre-visit preparation completed: Yes  Pain : 0-10 Pain Score: 7  Pain Type: Chronic pain Pain Location: Leg Pain Orientation: Right, Left Pain Descriptors / Indicators: Aching, Sore, Discomfort Pain Onset: More than a month ago Pain Frequency: Intermittent     BMI - recorded: 31.18 Nutritional Status: BMI > 30  Obese Nutritional Risks: None Diabetes: No  How often do you need to have someone help you when you read instructions, pamphlets, or other written materials from your doctor or pharmacy?: 1 - Never  Diabetic? no  Interpreter Needed?: No  Information entered by :: Josealberto Montalto, LPN   Activities of Daily Living In your present state of health, do you have any difficulty performing the following activities: 05/28/2021  Hearing? N  Vision? N  Difficulty concentrating or making decisions? N  Walking or climbing stairs? Y  Dressing or bathing? N  Doing errands, shopping? N  Preparing Food and eating ? N  Using the Toilet? N  In the past six months, have you accidently leaked urine? N  Do you  have problems with loss of bowel control? N  Managing your Medications? N  Managing your Finances? N  Housekeeping or managing your Housekeeping? N  Some recent data might be hidden    Patient Care Team: Loman Brooklyn, FNP as PCP - General (Family Medicine) Celestia Khat, [place] (Optometry) Blanca Friend Royce Macadamia, [facility] as [place] [facility] (Pharmacist)  Indicate any recent Medical Services you may have received from other than [facility] providers in the past year (date may be approximate).     Assessment:   This is a routine wellness examination for Krisha.  Hearing/Vision screen Hearing Screening - Comments:: Denies hearing difficulties  Vision Screening - Comments:: Wears rx glasses prn reading, driving - up to date with annual eye exams with [facility]  Dietary issues and exercise activities discussed: Current Exercise Habits: Home exercise routine, Type of exercise: walking;stretching, Time (Minutes): 60, Frequency (Times/Week): 7, Weekly Exercise (Minutes/Week): 420, Intensity: Mild, Exercise limited by: orthopedic condition(s)   Goals Addressed             This Visit's Progress    Prevent falls        Depression Screen PHQ 2/9 Scores 05/28/2021 05/06/2021 11/26/2020 05/16/2020 02/07/2020 01/23/2020 11/08/2019  PHQ - 2 Score 0 0 1 0 0 0 0  PHQ- 9 Score 2 3 2  - - - -    Fall Risk Fall Risk  05/28/2021 05/06/2021 05/16/2020 02/07/2020 01/23/2020  Falls in the past year? 1 1 0 1 0  Number falls in past yr: 0 0 - 0 -  Injury with Fall? 0 0 - 0 -  Comment - - - - -  Risk for fall due to : Orthopedic patient;Impaired balance/gait;History of fall(s) - - - -  Follow up Falls prevention discussed Falls prevention discussed - Education provided -    FALL RISK PREVENTION PERTAINING TO THE HOME:  Any stairs in or around the home? Yes  If so, are there any without handrails? No  Home free of loose throw rugs in walkways, pet beds, electrical cords, etc? Yes  Adequate lighting in your home to reduce risk of falls? Yes   ASSISTIVE DEVICES UTILIZED TO PREVENT FALLS:  Life alert? No  Use of a cane, walker or w/c? Yes  Grab bars in the bathroom? Yes  Shower chair or bench in shower? Yes  Elevated toilet seat or a handicapped toilet? Yes   TIMED UP AND GO:  Was the test performed? No . Telephonic visit  Cognitive Function:     6CIT Screen 05/28/2021  What Year? 0 points  What month? 0 points  What time? 0 points  Count back from 20 0 points  Months in reverse 0 points  Repeat phrase 2 points  Total Score 2    Immunizations Immunization History  Administered Date(s) Administered   Fluad Quad(high Dose 65+) 05/06/2021   Influenza, High Dose Seasonal PF 04/23/2017, 05/09/2018, 05/09/2020   Influenza,inj,Quad PF,6+ Mos 04/23/2019   Influenza,inj,quad, With Preservative 04/18/2017   Moderna Covid-19 Vaccine Bivalent Booster 47yrs & up 04/29/2021   Moderna Sars-Covid-2 Vaccination 08/01/2019, 08/29/2019, 03/18/2020, 12/31/2020   Pneumococcal Conjugate-13 07/19/2016   Pneumococcal Polysaccharide-23 07/19/2008   Tdap 02/11/2016    TDAP status: Up to date  Flu Vaccine status: Up to date  Pneumococcal vaccine status: Up to date  Covid-19 vaccine status: Completed vaccines  Qualifies for Shingles Vaccine? Yes   Zostavax completed Yes   Shingrix Completed?: No.    Education has been provided regarding the importance of this vaccine. Patient has been advised to call insurance company to determine out of pocket expense if they have not yet received this vaccine. Advised may also receive vaccine at local pharmacy or Health Dept. Verbalized acceptance and understanding.  Screening Tests Health Maintenance  Topic Date Due   Zoster Vaccines- Shingrix (1 of 2) 08/06/2021 (Originally 09/25/1950)   TETANUS/TDAP  02/10/2026   Pneumonia Vaccine 62+ Years old  Completed   INFLUENZA VACCINE  Completed   DEXA SCAN  Completed   COVID-19  Vaccine  Completed   HPV VACCINES  Aged Out    Health Maintenance  There are no preventive care reminders to display for this patient.   Colorectal cancer screening: No longer required.   Mammogram status: No longer required due to age.  Bone Density status: Completed 05/16/2020. Results reflect: Bone density results: OSTEOPENIA. Repeat every 2 years.  Lung Cancer Screening: (Low Dose CT Chest recommended if Age 81-80 years, 30 pack-year currently smoking OR have quit w/in 15years.) does not qualify  Additional Screening:  Hepatitis C Screening: does not qualify  Vision Screening: Recommended annual ophthalmology exams for early detection of glaucoma and other disorders of the eye. Is the patient up to date with their annual eye exam?  Yes  Who is the provider or what is the name of the office in which the patient attends annual eye exams? [place] If pt is not established with a provider, would they like to be referred to a provider to establish care? No .   Dental Screening: Recommended annual dental exams for proper oral hygiene  Community Resource Referral / Chronic Care Management: CRR required this visit?  No   CCM required this visit?  No      Plan:     I have personally reviewed and noted the following in the patient's chart:   Medical and social history Use of alcohol, tobacco or illicit drugs  Current medications and supplements including opioid prescriptions.  Functional ability and status Nutritional status Physical activity Advanced directives List of other physicians Hospitalizations,  surgeries, and ER visits in previous 12 months Vitals Screenings to include cognitive, depression, and falls Referrals and appointments  In addition, I have reviewed and discussed with patient certain preventive protocols, quality metrics, and best practice recommendations. A written personalized care plan for preventive services as well as general preventive  health recommendations were provided to patient.     Sandrea Hammond, LPN   [id number]   Nurse Notes: Wants referral to ortho for her knee and leg pain - Swintek did her surgeries years ago - would like to see him if possible.

## 2021-05-29 NOTE — Telephone Encounter (Signed)
Appointment scheduled.

## 2021-06-01 NOTE — Progress Notes (Signed)
Virtual Visit via Telephone Note  I connected with  Susan Haney on 05/28/2021 at  3:30 PM EST by telephone and verified that I am speaking with the correct person using two identifiers.  Location: Patient: Home Provider: [facility] Persons participating in the virtual visit: patient/Nurse Health Advisor   I discussed the limitations, risks, security and privacy concerns of performing an evaluation and management service by telephone and the availability of in person appointments. The patient expressed understanding and agreed to proceed.  Interactive audio and video telecommunications were attempted between this nurse and patient, however failed, due to patient having technical difficulties OR patient did not have access to video capability.  We continued and completed visit with audio only.  Some vital signs may be absent or patient reported.   Atreus Hasz Dionne Ano, LPN

## 2021-06-02 ENCOUNTER — Encounter: Payer: Self-pay | Admitting: Family Medicine

## 2021-06-02 ENCOUNTER — Other Ambulatory Visit: Payer: Self-pay

## 2021-06-02 ENCOUNTER — Ambulatory Visit (INDEPENDENT_AMBULATORY_CARE_PROVIDER_SITE_OTHER): Payer: Medicare Other

## 2021-06-02 ENCOUNTER — Ambulatory Visit (INDEPENDENT_AMBULATORY_CARE_PROVIDER_SITE_OTHER): Payer: Medicare Other | Admitting: Family Medicine

## 2021-06-02 VITALS — BP 151/72 | HR 71 | Temp 97.9°F | Ht 63.0 in | Wt 178.8 lb

## 2021-06-02 DIAGNOSIS — G8929 Other chronic pain: Secondary | ICD-10-CM

## 2021-06-02 DIAGNOSIS — R52 Pain, unspecified: Secondary | ICD-10-CM | POA: Diagnosis not present

## 2021-06-02 DIAGNOSIS — M25562 Pain in left knee: Secondary | ICD-10-CM

## 2021-06-02 DIAGNOSIS — M25561 Pain in right knee: Secondary | ICD-10-CM

## 2021-06-02 DIAGNOSIS — M25362 Other instability, left knee: Secondary | ICD-10-CM

## 2021-06-02 DIAGNOSIS — R202 Paresthesia of skin: Secondary | ICD-10-CM | POA: Diagnosis not present

## 2021-06-02 NOTE — Progress Notes (Signed)
Assessment & Plan:  1. Chronic pain of both knees - DG Knee 1-2 Views Left - DG Knee 1-2 Views Right - Ambulatory referral to Orthopedic Surgery  2. Knee gives out, left - DG Knee 1-2 Views Left - Ambulatory referral to Orthopedic Surgery  3. Tingling sensation - Magnesium - TSH - Vitamin B12 - Ambulatory referral to Orthopedic Surgery  4. Burning pain - Magnesium - TSH - Vitamin B12 - Ambulatory referral to Orthopedic Surgery   Follow up plan: Return as scheduled.  Hendricks Limes, MSN, APRN, FNP-C [facility]  Subjective:   Patient ID: Susan Haney, female    DOB: 1931-11-01, 87 y.o.   MRN: [id number]  HPI: Susan Haney is a 87 y.o. female presenting on 06/02/2021 for Knee Pain (Bilateral knee and leg pain that has gotten worse)  Patient is concerned about both knees and legs.  She describes her legs as burning, tingling, and restless. She does wear compression most of the time, but does not feel like it helps her legs during the day.  She states when she takes her compression hose off in the evening it makes the pain worse.  Recent CBC and CMP with no abnormalities.  She is worried about her knees as she has had bilateral knee replacements.  She states her left knee is only 87 years old and she does not feel it gets a "good connection".  She feels like this knee gives out on her and that she drags it a little.  Her right knee is 87 years old and she is worried that it is going to give out since it is older than the left and she is having problems on the left.  She has pain bilaterally for which she takes Tylenol.  She previously saw Dr. Lyla Glassing at [facility], and would like to be referred back to him.   ROS: Negative unless specifically indicated above in HPI.   Relevant past medical history reviewed and updated as indicated.   Allergies and medications reviewed and updated.   Current Outpatient Medications:    aspirin EC 81 MG  tablet, Take 81 mg by mouth daily., Disp: , Rfl:    Biotin 1000 MCG tablet, Take 1,000 mcg by mouth daily., Disp: , Rfl:    cetirizine (ZYRTEC) 10 MG tablet, Take 1 tablet (10 mg total) by mouth daily., Disp: 90 tablet, Rfl: 1   cholecalciferol (VITAMIN D) 400 UNITS TABS tablet, Take 2,000 Units by mouth., Disp: , Rfl:    dapagliflozin propanediol (FARXIGA) 10 MG TABS tablet, Take 1 tablet (10 mg total) by mouth daily., Disp: 90 tablet, Rfl: 1   docusate sodium (COLACE) 100 MG capsule, Take 100 mg by mouth 2 (two) times daily., Disp: , Rfl:    fluticasone (FLONASE) 50 MCG/ACT nasal spray, SPRAY 2 SPRAYS INTO EACH NOSTRIL EVERY DAY, Disp: 48 mL, Rfl: 2   glucose blood (ACCU-CHEK AVIVA PLUS) test strip, Use to check Blood Sugars 1-2 times daily. Dx E11.9, Disp: 100 each, Rfl: 12   lisinopril-hydrochlorothiazide (ZESTORETIC) 20-25 MG tablet, Take 1 tablet by mouth daily., Disp: 90 tablet, Rfl: 1   lubiprostone (AMITIZA) 8 MCG capsule, Take 1 capsule (8 mcg total) by mouth 2 (two) times daily with a meal. (Patient taking differently: Take 8 mcg by mouth daily.), Disp: 180 capsule, Rfl: 3   Misc Natural Products (TART CHERRY ADVANCED) CAPS, Take by mouth daily. , Disp: , Rfl:    montelukast (SINGULAIR) 10 MG tablet, Take  1 tablet (10 mg total) by mouth at bedtime., Disp: 90 tablet, Rfl: 1   Multiple Vitamins-Minerals (MULTIVITAMIN WITH MINERALS) tablet, Take 1 tablet by mouth daily., Disp: , Rfl:    nystatin-triamcinolone (MYCOLOG II) cream, Apply 1 application topically 2 (two) times daily., Disp: 30 g, Rfl: 1   Omega-3 Fatty Acids (MAXEPA PO), Take 500 mg by mouth daily at 6 (six) AM., Disp: , Rfl:    omeprazole (PRILOSEC) 20 MG capsule, Take 1 capsule (20 mg total) by mouth daily., Disp: 90 capsule, Rfl: 1   psyllium (METAMUCIL) 58.6 % packet, Take 1 packet by mouth daily., Disp: , Rfl:    triamcinolone cream (KENALOG) 0.5 %, SMARTSIG:1 Sparingly Topical 3 Times Daily, Disp: , Rfl:   No Known  Allergies  Objective:   BP (!) 151/72   Pulse 71   Temp 97.9 F (36.6 C) (Temporal)   Ht 5\' 3"  (1.6 m)   Wt 178 lb 12.8 oz (81.1 kg)   BMI 31.67 kg/m    Physical Exam Vitals reviewed.  Constitutional:      General: She is not in acute distress.    Appearance: Normal appearance. She is obese. She is not ill-appearing, toxic-appearing or diaphoretic.  HENT:     Head: Normocephalic and atraumatic.  Eyes:     General: No scleral icterus.       Right eye: No discharge.        Left eye: No discharge.     Conjunctiva/sclera: Conjunctivae normal.  Cardiovascular:     Rate and Rhythm: Normal rate.  Pulmonary:     Effort: Pulmonary effort is normal. No respiratory distress.  Musculoskeletal:        General: Normal range of motion.     Cervical back: Normal range of motion.  Skin:    General: Skin is warm and dry.     Capillary Refill: Capillary refill takes less than 2 seconds.  Neurological:     General: No focal deficit present.     Mental Status: She is alert and oriented to person, place, and time. Mental status is at baseline.  Psychiatric:        Mood and Affect: Mood normal.        Behavior: Behavior normal.        Thought Content: Thought content normal.        Judgment: Judgment normal.

## 2021-06-03 LAB — TSH: TSH: 1.64 u[IU]/mL (ref 0.450–4.500)

## 2021-06-03 LAB — VITAMIN B12: Vitamin B-12: 368 pg/mL (ref 232–1245)

## 2021-06-03 LAB — MAGNESIUM: Magnesium: 2.1 mg/dL (ref 1.6–2.3)

## 2021-06-08 ENCOUNTER — Encounter: Payer: Self-pay | Admitting: Family Medicine

## 2021-06-09 ENCOUNTER — Telehealth: Payer: Medicare Other

## 2021-06-15 ENCOUNTER — Telehealth: Payer: Medicare Other

## 2021-06-17 ENCOUNTER — Ambulatory Visit (INDEPENDENT_AMBULATORY_CARE_PROVIDER_SITE_OTHER): Payer: Medicare Other | Admitting: *Deleted

## 2021-06-17 DIAGNOSIS — N1832 Chronic kidney disease, stage 3b: Secondary | ICD-10-CM

## 2021-06-17 DIAGNOSIS — I10 Essential (primary) hypertension: Secondary | ICD-10-CM

## 2021-06-17 DIAGNOSIS — K5904 Chronic idiopathic constipation: Secondary | ICD-10-CM

## 2021-06-17 DIAGNOSIS — K5900 Constipation, unspecified: Secondary | ICD-10-CM

## 2021-06-21 NOTE — Chronic Care Management (AMB) (Signed)
Chronic Care Management   CCM RN Visit Note  06/17/2021 Name: Susan Haney MRN: [id number] DOB: [birth year]  Subjective: Susan Haney is a 87 y.o. year old female who is a primary care patient of Loman Brooklyn, FNP. The care management team was consulted for assistance with disease management and care coordination needs.    Engaged with patient by telephone for initial visit in response to provider referral for case management and/or care coordination services.   Consent to Services:  The patient was given the following information about Chronic Care Management services today, agreed to services, and gave verbal consent: 1. CCM service includes personalized support from designated clinical staff supervised by the primary care provider, including individualized plan of care and coordination with other care providers 2. 24/7 contact phone numbers for assistance for urgent and routine care needs. 3. Service will only be billed when office clinical staff spend 20 minutes or more in a month to coordinate care. 4. Only one practitioner may furnish and bill the service in a calendar month. 5.The patient may stop CCM services at any time (effective at the end of the month) by phone call to the office staff. 6. The patient will be responsible for cost sharing (co-pay) of up to 20% of the service fee (after annual deductible is met). Patient agreed to services and consent obtained.  Patient agreed to services and verbal consent obtained.   Assessment: Review of patient past medical history, allergies, medications, health status, including review of consultants reports, laboratory and other test data, was performed as part of comprehensive evaluation and provision of chronic care management services.   SDOH (Social Determinants of Health) assessments and interventions performed:    CCM Care Plan  No Known Allergies  Outpatient Encounter Medications as of 06/17/2021  Medication Sig Note    aspirin EC 81 MG tablet Take 81 mg by mouth daily.    Biotin 1000 MCG tablet Take 1,000 mcg by mouth daily.    cetirizine (ZYRTEC) 10 MG tablet Take 1 tablet (10 mg total) by mouth daily.    cholecalciferol (VITAMIN D) 400 UNITS TABS tablet Take 2,000 Units by mouth.    dapagliflozin propanediol (FARXIGA) 10 MG TABS tablet Take 1 tablet (10 mg total) by mouth daily.    docusate sodium (COLACE) 100 MG capsule Take 100 mg by mouth 2 (two) times daily. 11/26/2019: Patient states that she takes once a day.   fluticasone (FLONASE) 50 MCG/ACT nasal spray SPRAY 2 SPRAYS INTO EACH NOSTRIL EVERY DAY    glucose blood (ACCU-CHEK AVIVA PLUS) test strip Use to check Blood Sugars 1-2 times daily. Dx E11.9    lisinopril-hydrochlorothiazide (ZESTORETIC) 20-25 MG tablet Take 1 tablet by mouth daily.    lubiprostone (AMITIZA) 8 MCG capsule Take 1 capsule (8 mcg total) by mouth 2 (two) times daily with a meal. (Patient taking differently: Take 8 mcg by mouth daily.)    Misc Natural Products (TART CHERRY ADVANCED) CAPS Take by mouth daily.     montelukast (SINGULAIR) 10 MG tablet Take 1 tablet (10 mg total) by mouth at bedtime.    Multiple Vitamins-Minerals (MULTIVITAMIN WITH MINERALS) tablet Take 1 tablet by mouth daily.    nystatin-triamcinolone (MYCOLOG II) cream Apply 1 application topically 2 (two) times daily.    Omega-3 Fatty Acids (MAXEPA PO) Take 500 mg by mouth daily at 6 (six) AM.    omeprazole (PRILOSEC) 20 MG capsule Take 1 capsule (20 mg total) by mouth daily.  psyllium (METAMUCIL) 58.6 % packet Take 1 packet by mouth daily.    triamcinolone cream (KENALOG) 0.5 % SMARTSIG:1 Sparingly Topical 3 Times Daily    No facility-administered encounter medications on file as of 06/17/2021.    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 10/20/2016   Endometrial cancer ([place]) 04/13/2016   GERD (gastroesophageal reflux disease) 06/30/2015   Chronic lower back pain 06/18/2014   Prediabetes 02/21/2014   Constipation 02/21/2014    Conditions to be addressed/monitored:HTN, CKD Stage 3, Osteoarthritis, and chronic idiopathic constipation  Care Plan : [facility] Care Plan     Problem: Chronic Disease Management Needs   Priority: High  Onset Date: 06/17/2021     Long-Range Goal: Work with RN Care Manager to Develop a Plan of Care Regarding Care Management and Care Coordination Asociated with Hypertension, Arthritis, CKD, and Chronic Idiopathic Constipation   Start Date: 06/17/2021  Expected End Date: 06/17/2022  This Visit's Progress: On track  Priority: High  Note:   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  Patient will work with CCM Team regarding prescription assistance   Interventions: 1:1 collaboration with primary care provider regarding development and update of comprehensive plan of care as evidenced by provider attestation and co-signature Inter-disciplinary care team collaboration (see longitudinal plan of care) Evaluation of current treatment plan related to  self management and patient's adherence to plan as established by provider Assessed mobility and ability to perform ADLs Discussed family/social support   SDOH Barriers (Status:  New goal.) Short Term Goal  Patient interviewed and SDOH assessment performed       Provided patient with information about tier levels and insurance coverage of [place]. [place] is Tier 3. Complete application for tier exception Collaborated with PCP and nursing staff to have them print, sign, and fax in complete application Discussed Medicare Extra Help (LIS) Reviewed and discussed Patient Assistance for [place], eligibility requirements, and ways to apply Appointment scheduled for next week to assist patient with online application Provided with RNCM contact information and encouraged to reach out as needed   Patient Goals/Self-Care Activities: Call RN Care Manager as needed  [phone] Keep all medical appointments Work with RN Care Manager to complete patient assistance application for [place] follow up appointment with care management team member scheduled for:  06/22/21 with [facility] The patient has been provided with contact information for the care management team and has been advised to call with any health related questions or concerns.   Chong Sicilian, BSN, RN-BC Embedded Chronic Care Manager [facility] / [place] Management Direct Dial: [phone]

## 2021-06-22 ENCOUNTER — Ambulatory Visit (INDEPENDENT_AMBULATORY_CARE_PROVIDER_SITE_OTHER): Payer: Medicare Other | Admitting: *Deleted

## 2021-06-22 DIAGNOSIS — N1832 Chronic kidney disease, stage 3b: Secondary | ICD-10-CM

## 2021-06-22 DIAGNOSIS — K5904 Chronic idiopathic constipation: Secondary | ICD-10-CM

## 2021-06-22 DIAGNOSIS — I10 Essential (primary) hypertension: Secondary | ICD-10-CM

## 2021-06-22 NOTE — Patient Instructions (Signed)
Visit Information  Patient Goals/Self-Care Activities: Call RN Care Manager as needed  [phone] Keep all medical appointments Take medication as prescribed  Patient verbalizes understanding of instructions provided today and agrees to view in [place].   Plan:Telephone follow up appointment with care management team member scheduled for:  07/16/2021 with [facility] The patient has been provided with contact information for the care management team and has been advised to call with any health related questions or concerns.   Chong Sicilian, BSN, RN-BC Embedded Chronic Care Manager [facility] / [place] Management Direct Dial: [phone]

## 2021-06-22 NOTE — Patient Instructions (Signed)
Visit Information  Patient Goals/Self-Care Activities: Call RN Care Manager as needed  [phone] Keep all medical appointments Work with RN Care Manager to complete patient assistance application for [place]  Patient verbalizes understanding of instructions provided today and agrees to view in [place].   Plan:Telephone follow up appointment with care management team member scheduled for:  06/22/21 with [facility] The patient has been provided with contact information for the care management team and has been advised to call with any health related questions or concerns.   Chong Sicilian, BSN, RN-BC Embedded Chronic Care Manager [facility] / [place] Management Direct Dial: [phone]

## 2021-06-22 NOTE — Chronic Care Management (AMB) (Signed)
Chronic Care Management   CCM RN Visit Note  06/22/2021 Name: Susan Haney MRN: [id number] DOB: Nov 25, 1931  Subjective: Susan Haney is a 87 y.o. year old female who is a primary care patient of Loman Brooklyn, FNP. The care management team was consulted for assistance with disease management and care coordination needs.    Engaged with patient by telephone for follow up visit in response to provider referral for case management and/or care coordination services.   Consent to Services:  The patient was given information about Chronic Care Management services, agreed to services, and gave verbal consent prior to initiation of services.  Please see initial visit note for detailed documentation.   Patient agreed to services and verbal consent obtained.   Assessment: Review of patient past medical history, allergies, medications, health status, including review of consultants reports, laboratory and other test data, was performed as part of comprehensive evaluation and provision of chronic care management services.   SDOH (Social Determinants of Health) assessments and interventions performed:    CCM Care Plan  No Known Allergies  Outpatient Encounter Medications as of 06/22/2021  Medication Sig Note   aspirin EC 81 MG tablet Take 81 mg by mouth daily.    Biotin 1000 MCG tablet Take 1,000 mcg by mouth daily.    cetirizine (ZYRTEC) 10 MG tablet Take 1 tablet (10 mg total) by mouth daily.    cholecalciferol (VITAMIN D) 400 UNITS TABS tablet Take 2,000 Units by mouth.    dapagliflozin propanediol (FARXIGA) 10 MG TABS tablet Take 1 tablet (10 mg total) by mouth daily.    docusate sodium (COLACE) 100 MG capsule Take 100 mg by mouth 2 (two) times daily. 11/26/2019: Patient states that she takes once a day.   fluticasone (FLONASE) 50 MCG/ACT nasal spray SPRAY 2 SPRAYS INTO EACH NOSTRIL EVERY DAY    glucose blood (ACCU-CHEK AVIVA PLUS) test strip Use to check Blood Sugars 1-2 times daily.  Dx E11.9    lisinopril-hydrochlorothiazide (ZESTORETIC) 20-25 MG tablet Take 1 tablet by mouth daily.    lubiprostone (AMITIZA) 8 MCG capsule Take 1 capsule (8 mcg total) by mouth 2 (two) times daily with a meal. (Patient taking differently: Take 8 mcg by mouth daily.)    Misc Natural Products (TART CHERRY ADVANCED) CAPS Take by mouth daily.     montelukast (SINGULAIR) 10 MG tablet Take 1 tablet (10 mg total) by mouth at bedtime.    Multiple Vitamins-Minerals (MULTIVITAMIN WITH MINERALS) tablet Take 1 tablet by mouth daily.    nystatin-triamcinolone (MYCOLOG II) cream Apply 1 application topically 2 (two) times daily.    Omega-3 Fatty Acids (MAXEPA PO) Take 500 mg by mouth daily at 6 (six) AM.    omeprazole (PRILOSEC) 20 MG capsule Take 1 capsule (20 mg total) by mouth daily.    psyllium (METAMUCIL) 58.6 % packet Take 1 packet by mouth daily.    triamcinolone cream (KENALOG) 0.5 % SMARTSIG:1 Sparingly Topical 3 Times Daily    No facility-administered encounter medications on file as of 06/22/2021.    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 10/20/2016   Endometrial cancer ([place])  04/13/2016   GERD (gastroesophageal reflux disease) 06/30/2015   Chronic lower back pain 06/18/2014   Prediabetes 02/21/2014   Constipation 02/21/2014    Conditions to be addressed/monitored:HTN, DMII, and Chronic Idiopathic Constipation, Prescription Assistance  Care Plan : [place]  Updates made by Ilean China, RN since 06/22/2021 12:00 AM     Problem: Chronic Disease Management Needs   Priority: High  Onset Date: 06/17/2021      Long-Range Goal: Work with RN Care Manager to Develop a Plan of Care Regarding Care Management and Care Coordination Asociated with Hypertension, Arthritis, CKD, and Chronic Idiopathic Constipation   Start Date: 06/17/2021  Expected End Date: 06/17/2022  This Visit's Progress: On track  Recent Progress: On track  Priority: High  Note:   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  Patient will work with CCM Team regarding prescription assistance   Interventions: 1:1 collaboration with primary care provider regarding development and update of comprehensive plan of care as evidenced by provider attestation and co-signature Inter-disciplinary care team collaboration (see longitudinal plan of care) Evaluation of current treatment plan related to  self management and patient's adherence to plan as established by provider   SDOH Barriers (Status: Goal on Track (progressing): YES.) Short Term Goal  Patient interviewed and SDOH assessment performed       Provided patient with information about tier levels and insurance coverage of [place] Previously completed application for [place] tier exception and collaborated with PCP and nursing staff to sign and fax in Assisted patient with completion of [place] patient assistance application online through [employer]. Patient was approved.  Collaborated with PCP and clinical staff regarding next steps. [employer] will send over a prescription form to be completed and signed by the provider.  Patient advised that she should receive an approval letter and that the  medication will be mailed to her home Discussed refill process Provided with [facility] contact information and encouraged to reach out as needed   Patient Goals/Self-Care Activities: Call RN Care Manager as needed  [phone] Keep all medical appointments Take medication as prescribed  Plan:Telephone follow up appointment with care management team member scheduled for:  07/16/2021 with RNCM The patient has been provided with contact information for the care management team and has been advised to call with any health related questions or concerns.   Chong Sicilian, BSN, RN-BC Embedded Chronic Care Manager [facility] / [place] [facility] Direct Dial: [phone]

## 2021-07-07 ENCOUNTER — Ambulatory Visit: Payer: Medicare Other | Attending: Student | Admitting: Physical Therapy

## 2021-07-07 ENCOUNTER — Other Ambulatory Visit: Payer: Self-pay

## 2021-07-07 DIAGNOSIS — G8929 Other chronic pain: Secondary | ICD-10-CM | POA: Diagnosis present

## 2021-07-07 DIAGNOSIS — M25662 Stiffness of left knee, not elsewhere classified: Secondary | ICD-10-CM | POA: Diagnosis present

## 2021-07-07 DIAGNOSIS — M25562 Pain in left knee: Secondary | ICD-10-CM | POA: Diagnosis present

## 2021-07-07 DIAGNOSIS — M25561 Pain in right knee: Secondary | ICD-10-CM | POA: Diagnosis not present

## 2021-07-07 DIAGNOSIS — M25661 Stiffness of right knee, not elsewhere classified: Secondary | ICD-10-CM | POA: Insufficient documentation

## 2021-07-07 NOTE — Therapy (Addendum)
[facility] [place], [place], [address] Phone: [phone]   Fax:  [phone]  Physical Therapy Evaluation  Patient Details  Name: Susan Haney MRN: [id number] Date of Birth: 1931/08/10 Referring Provider (PT): Cherlynn June PA-C   Encounter Date: 07/07/2021   PT End of Session - 07/07/21 1205     Visit Number 1    Number of Visits 12    Date for PT Re-Evaluation 08/04/21    Authorization Type OA, bilateral TKA's with a left knee revision on 02/12/16.  HTN, h/o low back and bilateral hip pain, CTR (right) bunionectomy.    PT Start Time [phone]    PT Stop Time 1040    PT Time Calculation (min) 46 min    Activity Tolerance Patient tolerated treatment well    Behavior During Therapy WFL for tasks assessed/performed             Past Medical History:  Diagnosis Date   Anemia    history ,  took iron for years   Arthritis    Bicuspid aortic valve    Cancer ([place]) 2017   endometrial cancer / cervical   Cellulitis 12/31/2017   BLE, ankles   Chronic back pain    DDD (degenerative disc disease), lumbar    Endometrial cancer ([place])    GERD (gastroesophageal reflux disease)    History of radiation therapy 06/15/16-07/06/16   vaginal cuff 30 Gy in 5 fractions   Hypertension    Prediabetes    Spondylosis    lumbar    Past Surgical History:  Procedure Laterality Date   BUNIONECTOMY     CARPAL TUNNEL RELEASE Right 05/30/2018   Procedure: RIGHT CARPAL TUNNEL RELEASE;  Surgeon: Daryll Brod, MD;  Location: [facility];  Service: Orthopedics;  Laterality: Right;   COLONOSCOPY N/A 04/04/2014   Procedure: COLONOSCOPY;  Surgeon: Rogene Houston, MD;  [facility];  Service: Endoscopy;  Laterality: N/A;  100   EYE SURGERY     bil cataracts and lens implants   FOOT SURGERY     Left   Ovary removed: benign     REPLACEMENT TOTAL KNEE BILATERAL     2013   ROBOTIC ASSISTED TOTAL HYSTERECTOMY WITH  BILATERAL SALPINGO OOPHERECTOMY Left 04/13/2016   Procedure: XI ROBOTIC ASSISTED TOTAL HYSTERECTOMY WITH LEFT SALPINGO OOPHORECTOMY SENTINEL LYMPH NODE BIOPSY;  Surgeon: Everitt Amber, MD;  Location: [facility];  Service: Gynecology;  Laterality: Left;   TOTAL KNEE REVISION Left 02/12/2016   Procedure: LEFT TOTAL KNEE REVISION;  Surgeon: Rod Can, MD;  Location: [facility];  Service: Orthopedics;  Laterality: Left;    There were no vitals filed for this visit.    Subjective Assessment - 07/07/21 1042     Subjective COVID-19 screen performed prior to patient entering clinic.  The patient presents to the clinic today with c/o bilateral knee pain.  She states her knee replacements are about 85 years old and in 2017 had to have a revision after falling on her left knee.  Her pain in both knees is rated at an 8/10 today.  She reports her left knee will give way at times.  She reports pain will radiate in her bilateral calf regions.She walks with a cane for safety.  She hasn't found anything really helps decrease her pain.  She states her pain also tends to increase while she is trying to settle at night.    Pertinent History OA, bilateral TKA's with a  left knee revision on 02/12/16.  HTN, h/o low back and bilateral hip pain, CTR (right) bunionectomy.    How long can you stand comfortably? Varies.    How long can you walk comfortably? Walk around home and short community distances with a cane.    Patient Stated Goals Decrease pain and be able to do more.    Currently in Pain? Yes    Pain Score 8     Pain Location Knee    Pain Orientation Right;Left    Pain Descriptors / Indicators Aching;Throbbing;Sharp;Numbness;Shooting    Pain Type Chronic pain    Pain Onset More than a month ago    Aggravating Factors  See above.    Pain Relieving Factors See above.                [facility] PT Assessment - 07/07/21 0001       Assessment   Medical Diagnosis Bilateral total knee replacements.    Referring  Provider (PT) Cherlynn June PA-C    Onset Date/Surgical Date --   Ongoing.     Precautions   Precaution Comments No ultrasound.      Restrictions   Weight Bearing Restrictions No      Balance Screen   Has the patient fallen in the past 6 months No    Has the patient had a decrease in activity level because of a fear of falling?  Yes    Is the patient reluctant to leave their home because of a fear of falling?  No      Home Environment   Living Environment Private residence      Prior Function   Level of Independence Independent      Observation/Other Assessments   Focus on Therapeutic Outcomes (FOTO)  Complete.      Observation/Other Assessments-Edema    Edema Circumferential      Circumferential Edema   Circumferential - Left  LT 2 cms > RT.      Posture/Postural Control   Posture/Postural Control Postural limitations    Postural Limitations Rounded Shoulders;Forward head    Posture Comments Right knee in supine is -7 degrees.      ROM / Strength   AROM / PROM / Strength AROM;Strength      AROM   Overall AROM Comments Left knee exhibits full extension is flexion to 105 degrees.  Right knee extension is -7 degrees and 112 degrees of flexion.      Strength   Overall Strength Comments Bilateral hip flexion and abduction and bilateral knee extension is 4+/5.  She has notable left quadriceps atrophy per contralateral comparison.      Palpation   Palpation comment Patient c/o palpable pain around the periphery of both knees.  She has good patellar mobility bilaterally.      Ambulation/Gait   Gait Comments Slow and cautious with a cane.                        Objective measurements completed on examination: See above findings.       [facility] Adult PT Treatment/Exercise - 07/07/21 0001       Modalities   Modalities Electrical Stimulation;Vasopneumatic      Electrical Stimulation   Electrical Stimulation Location Left knee    Electrical Stimulation  Action IFc at 80-150 Hz.    Electrical Stimulation Parameters 40% scan x 15 minutes.    Electrical Stimulation Goals Edema;Pain      Vasopneumatic  Number Minutes Vasopneumatic  15 minutes    Vasopnuematic Location  --   Left knee.   Vasopneumatic Pressure Low                          PT Long Term Goals - 07/07/21 1210       PT LONG TERM GOAL #1   Title Patient will be independent with HEP    Time 4    Period Weeks    Status New      PT LONG TERM GOAL #2   Title Patient will demonstrate 115 degrees or greater of L knee flexion to improve ability to perform functional tasks.    Time 4    Period Weeks    Status New      PT LONG TERM GOAL #3   Title Full active right knee extension.    Time 4    Period Weeks    Status New      PT LONG TERM GOAL #4   Title Patient will reports ability to perform ADLs with less than 4/10 knee pain.    Time 4    Period Weeks    Status New                    Plan - 07/07/21 1159     Clinical Impression Statement The patient presents to OPPT with c/o bilateral knee pain.  She has had occasions whenher left knee gives way.  She walks safely with a cane and reports no recent falls.  She has a loss of bilateral knee range of motion and notable left quadriceps atrophy.  She alos reports bilateral hip pain and radiation of pain into bilateral calves.  Patient will benefit from skilled physical therapy intervention to address pain and deficits.    Personal Factors and Comorbidities Comorbidity 1;Comorbidity 2;Other    Examination-Activity Limitations Locomotion Level;Other;Stand;Stairs    Examination-Participation Restrictions Other;Yard Work;Cleaning    Stability/Clinical Decision Making Evolving/Moderate complexity    Clinical Decision Making Low    Rehab Potential Good    PT Frequency 2x / week    PT Duration 6 weeks    PT Treatment/Interventions ADLs/Self Care Home Management;Cryotherapy;Electrical Stimulation;Moist  Heat;Gait training;Stair training;Functional mobility training;Therapeutic activities;Therapeutic exercise;Neuromuscular re-education;Manual techniques;Patient/family education;Passive range of motion    PT Next Visit Plan Nustep, VMS to left quadriceps, knee machine (pain-free), rockerboard, hip exercises (ie:  abduction).  Vasopneumatic as needed.             Patient will benefit from skilled therapeutic intervention in order to improve the following deficits and impairments:  Abnormal gait, Decreased activity tolerance, Decreased strength, Decreased range of motion, Pain, Increased edema  Visit Diagnosis: Chronic pain of right knee - Plan: PT plan of care cert/re-cert  Chronic pain of left knee - Plan: PT plan of care cert/re-cert  Stiffness of right knee, not elsewhere classified - Plan: PT plan of care cert/re-cert  Stiffness of left knee, not elsewhere classified - Plan: PT plan of care cert/re-cert     Problem List Patient Active Problem List   Diagnosis Date Noted   Chronic idiopathic constipation 06/17/2021   Stage 3b chronic kidney disease ([place]) 05/06/2021   Perianal irritation 01/20/2021   Essential hypertension 08/06/2019   Vitamin D deficiency 08/06/2019   Genetic testing 01/07/2017   Family history of prostate cancer    Family history of cancer    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 [place], [place], [address] Phone: [phone]   Fax:  [phone]  Name: DENNICE TINDOL MRN: [id number] Date of Birth: 1932-07-09

## 2021-07-15 ENCOUNTER — Encounter: Payer: Self-pay | Admitting: Physical Therapy

## 2021-07-15 ENCOUNTER — Other Ambulatory Visit: Payer: Self-pay

## 2021-07-15 ENCOUNTER — Ambulatory Visit: Payer: Medicare Other | Admitting: Physical Therapy

## 2021-07-15 DIAGNOSIS — M25661 Stiffness of right knee, not elsewhere classified: Secondary | ICD-10-CM

## 2021-07-15 DIAGNOSIS — G8929 Other chronic pain: Secondary | ICD-10-CM

## 2021-07-15 DIAGNOSIS — M25561 Pain in right knee: Secondary | ICD-10-CM | POA: Diagnosis not present

## 2021-07-15 DIAGNOSIS — M25662 Stiffness of left knee, not elsewhere classified: Secondary | ICD-10-CM

## 2021-07-15 NOTE — Therapy (Signed)
[name], [place], [address] Phone: [phone]   Fax:  [phone]  Physical Therapy Treatment  Patient Details  Name: Susan Haney MRN: [id number] Date of Birth: [birth year] Referring Provider (PT): Cherlynn June PA-C   Encounter Date: 07/15/2021   PT End of Session - 07/15/21 0951     Visit Number 2    Number of Visits 12    Date for PT Re-Evaluation 08/04/21    Authorization Type OA, bilateral TKA's with a left knee revision on 02/12/16.  HTN, h/o low back and bilateral hip pain, CTR (right) bunionectomy.    PT Start Time [phone]    PT Stop Time 1038    PT Time Calculation (min) 48 min    Equipment Utilized During Treatment Other (comment)   [place]   Activity Tolerance Patient tolerated treatment well    Behavior During Therapy WFL for tasks assessed/performed             Past Medical History:  Diagnosis Date   Anemia    history ,  took iron for years   Arthritis    Bicuspid aortic valve    Cancer ([place]) 2017   endometrial cancer / cervical   Cellulitis 12/31/2017   BLE, ankles   Chronic back pain    DDD (degenerative disc disease), lumbar    Endometrial cancer ([place])    GERD (gastroesophageal reflux disease)    History of radiation therapy 06/15/16-07/06/16   vaginal cuff 30 Gy in 5 fractions   Hypertension    Prediabetes    Spondylosis    lumbar    Past Surgical History:  Procedure Laterality Date   BUNIONECTOMY     CARPAL TUNNEL RELEASE Right 05/30/2018   Procedure: RIGHT CARPAL TUNNEL RELEASE;  Surgeon: Daryll Brod, MD;  Location: [facility];  Service: Orthopedics;  Laterality: Right;   COLONOSCOPY N/A 04/04/2014   Procedure: COLONOSCOPY;  Surgeon: Rogene Houston, MD;  [facility];  Service: Endoscopy;  Laterality: N/A;  100   EYE SURGERY     bil cataracts and lens implants   FOOT SURGERY     Left   Ovary removed: benign     REPLACEMENT TOTAL KNEE  BILATERAL     2013   ROBOTIC ASSISTED TOTAL HYSTERECTOMY WITH BILATERAL SALPINGO OOPHERECTOMY Left 04/13/2016   Procedure: XI ROBOTIC ASSISTED TOTAL HYSTERECTOMY WITH LEFT SALPINGO OOPHORECTOMY SENTINEL LYMPH NODE BIOPSY;  Surgeon: Everitt Amber, MD;  Location: [facility];  Service: Gynecology;  Laterality: Left;   TOTAL KNEE REVISION Left 02/12/2016   Procedure: LEFT TOTAL KNEE REVISION;  Surgeon: Rod Can, MD;  Location[facility];  Service: Orthopedics;  Laterality: Left;    There were no vitals filed for this visit.   Subjective Assessment - 07/15/21 0946     Subjective Since last visit and over Christmas she has had a lot of pain. More stinging and burning down to L knee. Not sleeping well at night and has to sleep on her back due to hip pain.    Pertinent History OA, bilateral TKA's with a left knee revision on 02/12/16.  HTN, h/o low back and bilateral hip pain, CTR (right) bunionectomy.    How long can you stand comfortably? Varies.    How long can you walk comfortably? Walk around home and short community distances with a cane.    Patient Stated Goals Decrease pain and be able to do more.    Currently in Pain?  Yes    Pain Score --   No pain score provided   Pain Location Hip    Pain Orientation Left    Pain Descriptors / Indicators Burning;Tingling    Pain Type Chronic pain    Pain Radiating Towards L knee    Pain Onset More than a month ago    Pain Frequency Constant                [facility] PT Assessment - 07/15/21 0001       Assessment   Medical Diagnosis Bilateral total knee replacements.    Referring Provider (PT) Cherlynn June PA-C    Next MD Visit None      Precautions   Precaution Comments No ultrasound.      Restrictions   Weight Bearing Restrictions No                           [facility] Adult PT Treatment/Exercise - 07/15/21 0001       Exercises   Exercises Knee/Hip      Knee/Hip Exercises: Aerobic   Nustep L1, seat 9 x10 min       Knee/Hip Exercises: Supine   Short Arc Quad Sets Left;Limitations    Short Arc Quad Sets Limitations VMS to L quad for [employer] of quad activation      Modalities   Modalities Artist L quad    Chartered certified accountant VMS with SAQ    Electrical Stimulation Parameters 10/10 x10 min    Electrical Stimulation Goals Neuromuscular facilitation      Vasopneumatic   Number Minutes Vasopneumatic  10 minutes    Vasopnuematic Location  Knee    Vasopneumatic Pressure Medium    Vasopneumatic Temperature  34                     PT Education - 07/15/21 1042     Education Details [id number]    Person(s) Educated Patient    Methods Explanation;Demonstration;Handout    Comprehension Verbalized understanding                 PT Long Term Goals - 07/07/21 1210       PT LONG TERM GOAL #1   Title Patient will be independent with HEP    Time 4    Period Weeks    Status New      PT LONG TERM GOAL #2   Title Patient will demonstrate 115 degrees or greater of L knee flexion to improve ability to perform functional tasks.    Time 4    Period Weeks    Status New      PT LONG TERM GOAL #3   Title Full active right knee extension.    Time 4    Period Weeks    Status New      PT LONG TERM GOAL #4   Title Patient will reports ability to perform ADLs with less than 4/10 knee pain.    Time 4    Period Weeks    Status New                   Plan - 07/15/21 1045     Clinical Impression Statement Patient presented in clinic with reports of more knee pain since evaluation. Patient reports buckling intermittantly especially of L knee. Patient does have severe comorbidities to lumbar  spine and chronic B hip pain as well. Patient has HEP from previous PT session at different clinic and has continued that throughout [place] pandemic. Patient able to tolerate treatment well with normal  response to modalities. Patient provided new HEP for LE strengthening but encouraged to continue other HEP and bike at home.    Personal Factors and Comorbidities Comorbidity 1;Comorbidity 2;Other    Examination-Activity Limitations Locomotion Level;Other;Stand;Stairs    Examination-Participation Restrictions Other;Yard Work;Cleaning    Stability/Clinical Decision Making Evolving/Moderate complexity    Rehab Potential Good    PT Frequency 2x / week    PT Duration 6 weeks    PT Treatment/Interventions ADLs/Self Care Home Management;Cryotherapy;Electrical Stimulation;Moist Heat;Gait training;Stair training;Functional mobility training;Therapeutic activities;Therapeutic exercise;Neuromuscular re-education;Manual techniques;Patient/family education;Passive range of motion    PT Next Visit Plan Nustep, VMS to left quadriceps, knee machine (pain-free), rockerboard, hip exercises (ie:  abduction).  Vasopneumatic as needed.    Consulted and Agree with Plan of Care Patient             Patient will benefit from skilled therapeutic intervention in order to improve the following deficits and impairments:  Abnormal gait, Decreased activity tolerance, Decreased strength, Decreased range of motion, Pain, Increased edema  Visit Diagnosis: Chronic pain of right knee  Chronic pain of left knee  Stiffness of right knee, not elsewhere classified  Stiffness of left knee, not elsewhere classified     Problem List Patient Active Problem List   Diagnosis Date Noted   Chronic idiopathic constipation 06/17/2021   Stage 3b chronic kidney disease ([place]) 05/06/2021   Perianal irritation 01/20/2021   Essential hypertension 08/06/2019   Vitamin D deficiency 08/06/2019   Genetic testing 01/07/2017   Family history of prostate cancer    Family history of cancer    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

## 2021-07-16 ENCOUNTER — Telehealth: Payer: Medicare Other

## 2021-07-17 ENCOUNTER — Ambulatory Visit: Payer: Medicare Other | Admitting: Physical Therapy

## 2021-07-21 ENCOUNTER — Ambulatory Visit: Payer: Medicare Other | Attending: Student

## 2021-07-21 ENCOUNTER — Other Ambulatory Visit: Payer: Self-pay

## 2021-07-21 DIAGNOSIS — M25561 Pain in right knee: Secondary | ICD-10-CM | POA: Diagnosis not present

## 2021-07-21 DIAGNOSIS — M25661 Stiffness of right knee, not elsewhere classified: Secondary | ICD-10-CM | POA: Diagnosis not present

## 2021-07-21 DIAGNOSIS — G8929 Other chronic pain: Secondary | ICD-10-CM | POA: Insufficient documentation

## 2021-07-21 DIAGNOSIS — M25562 Pain in left knee: Secondary | ICD-10-CM | POA: Insufficient documentation

## 2021-07-21 DIAGNOSIS — M25662 Stiffness of left knee, not elsewhere classified: Secondary | ICD-10-CM | POA: Diagnosis not present

## 2021-07-21 NOTE — Therapy (Signed)
[facility] [place], [place], [address] Phone: [phone]   Fax:  [phone]  Physical Therapy Treatment  Patient Details  Name: Susan Haney MRN: [id number] Date of Birth: 09/21/1931 Referring Provider (PT): Cherlynn June PA-C   Encounter Date: 07/21/2021   PT End of Session - 07/21/21 0945     Visit Number 3    Number of Visits 12    Date for PT Re-Evaluation 08/04/21    Authorization Type OA, bilateral TKA's with a left knee revision on 02/12/16.  HTN, h/o low back and bilateral hip pain, CTR (right) bunionectomy.    PT Start Time 0940    PT Stop Time 8366    PT Time Calculation (min) 55 min    Equipment Utilized During Treatment Other (comment)   [place]   Activity Tolerance Patient tolerated treatment well    Behavior During Therapy WFL for tasks assessed/performed             Past Medical History:  Diagnosis Date   Anemia    history ,  took iron for years   Arthritis    Bicuspid aortic valve    Cancer ([place]) 2017   endometrial cancer / cervical   Cellulitis 12/31/2017   BLE, ankles   Chronic back pain    DDD (degenerative disc disease), lumbar    Endometrial cancer ([place])    GERD (gastroesophageal reflux disease)    History of radiation therapy 06/15/16-07/06/16   vaginal cuff 30 Gy in 5 fractions   Hypertension    Prediabetes    Spondylosis    lumbar    Past Surgical History:  Procedure Laterality Date   BUNIONECTOMY     CARPAL TUNNEL RELEASE Right 05/30/2018   Procedure: RIGHT CARPAL TUNNEL RELEASE;  Surgeon: Daryll Brod, MD;  Location: [facility];  Service: Orthopedics;  Laterality: Right;   COLONOSCOPY N/A 04/04/2014   Procedure: COLONOSCOPY;  Surgeon: Rogene Houston, MD;  [facility];  Service: Endoscopy;  Laterality: N/A;  100   EYE SURGERY     bil cataracts and lens implants   FOOT SURGERY     Left   Ovary removed: benign     REPLACEMENT TOTAL KNEE  BILATERAL     2013   ROBOTIC ASSISTED TOTAL HYSTERECTOMY WITH BILATERAL SALPINGO OOPHERECTOMY Left 04/13/2016   Procedure: XI ROBOTIC ASSISTED TOTAL HYSTERECTOMY WITH LEFT SALPINGO OOPHORECTOMY SENTINEL LYMPH NODE BIOPSY;  Surgeon: Everitt Amber, MD;  [facility];  Service: Gynecology;  Laterality: Left;   TOTAL KNEE REVISION Left 02/12/2016   Procedure: LEFT TOTAL KNEE REVISION;  Surgeon: Rod Can, MD;  [facility];  Service: Orthopedics;  Laterality: Left;    There were no vitals filed for this visit.   Subjective Assessment - 07/21/21 0937     Subjective Pt arrives for today's treatment session reporing 7/10 B knee pain.  Pt reports L knee pain as being burning and stinging "in the joint."  Pt states that she feels that B knees feel worse for about 2 days after treatment session.    Pertinent History OA, bilateral TKA's with a left knee revision on 02/12/16.  HTN, h/o low back and bilateral hip pain, CTR (right) bunionectomy.    How long can you stand comfortably? Varies.    How long can you walk comfortably? Walk around home and short community distances with a cane.    Patient Stated Goals Decrease pain and be able to do more.  Pain Onset More than a month ago                               [facility] Adult PT Treatment/Exercise - 07/21/21 0001       Knee/Hip Exercises: Aerobic   Nustep L1, seat 6, 15 mins      Knee/Hip Exercises: Seated   Long Arc Quad Strengthening;Both;20 reps;Weights    Long Arc Quad Weight 2 lbs.    Ball Squeeze 20 reps with 3 sec hold    Clamshell with TheraBand Red   20 reps with 3 sec hold   Marching Strengthening;Both;15 reps;Weights    Marching Weights 2 lbs.    Hamstring Curl Strengthening;Both;20 reps    Hamstring Limitations red tband      Modalities   Modalities Electrical Stimulation;Vasopneumatic      Electrical Stimulation   Electrical Stimulation Location L knee    Electrical Stimulation Action IFC at 80-150  Hz    Electrical Stimulation Parameters 40% scan x 15 mins    Electrical Stimulation Goals Pain;Edema      Vasopneumatic   Number Minutes Vasopneumatic  15 minutes    Vasopnuematic Location  Knee    Vasopneumatic Pressure Medium    Vasopneumatic Temperature  34                          PT Long Term Goals - 07/07/21 1210       PT LONG TERM GOAL #1   Title Patient will be independent with HEP    Time 4    Period Weeks    Status New      PT LONG TERM GOAL #2   Title Patient will demonstrate 115 degrees or greater of L knee flexion to improve ability to perform functional tasks.    Time 4    Period Weeks    Status New      PT LONG TERM GOAL #3   Title Full active right knee extension.    Time 4    Period Weeks    Status New      PT LONG TERM GOAL #4   Title Patient will reports ability to perform ADLs with less than 4/10 knee pain.    Time 4    Period Weeks    Status New                   Plan - 07/21/21 7867     Clinical Impression Statement Pt arrives for today's treatment session reporting 7/10 B knee pain with L bothering her more than R.  Pt states that her pain increases for 2 to 3 days post treatment session and then it begins to get better.  Pt able to tolerate Nustep x 15 mins for warmup without increase in pain.  Pt instructed in seated BLE strengthening exercises with min cues for proper technique and posture.  Pt requiring cues to avoid posture lean with all seated exercises.  Pt with slight increase in pain with seated marches.  Normal responses to vaso and estim noted upon removal.  Pt reported 5/10 B knee pain at completion of today's treatment session.    Personal Factors and Comorbidities Comorbidity 1;Comorbidity 2;Other    Examination-Activity Limitations Locomotion Level;Other;Stand;Stairs    Examination-Participation Restrictions Other;Yard Work;Cleaning    Stability/Clinical Decision Making Evolving/Moderate complexity     Rehab Potential Good    PT  Frequency 2x / week    PT Duration 6 weeks    PT Treatment/Interventions ADLs/Self Care Home Management;Cryotherapy;Electrical Stimulation;Moist Heat;Gait training;Stair training;Functional mobility training;Therapeutic activities;Therapeutic exercise;Neuromuscular re-education;Manual techniques;Patient/family education;Passive range of motion    PT Next Visit Plan Nustep, VMS to left quadriceps, knee machine (pain-free), rockerboard, hip exercises (ie:  abduction).  Vasopneumatic as needed.    Consulted and Agree with Plan of Care Patient             Patient will benefit from skilled therapeutic intervention in order to improve the following deficits and impairments:  Abnormal gait, Decreased activity tolerance, Decreased strength, Decreased range of motion, Pain, Increased edema  Visit Diagnosis: Chronic pain of right knee  Chronic pain of left knee  Stiffness of right knee, not elsewhere classified  Stiffness of left knee, not elsewhere classified     Problem List Patient Active Problem List   Diagnosis Date Noted   Chronic idiopathic constipation 06/17/2021   Stage 3b chronic kidney disease ([place]) 05/06/2021   Perianal irritation 01/20/2021   Essential hypertension 08/06/2019   Vitamin D deficiency 08/06/2019   Genetic testing 01/07/2017   Family history of prostate cancer    Family history of cancer    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

## 2021-07-23 ENCOUNTER — Ambulatory Visit: Payer: Medicare Other

## 2021-07-23 ENCOUNTER — Other Ambulatory Visit: Payer: Self-pay

## 2021-07-23 DIAGNOSIS — G8929 Other chronic pain: Secondary | ICD-10-CM

## 2021-07-23 DIAGNOSIS — M25561 Pain in right knee: Secondary | ICD-10-CM | POA: Diagnosis not present

## 2021-07-23 DIAGNOSIS — M25562 Pain in left knee: Secondary | ICD-10-CM

## 2021-07-23 DIAGNOSIS — M25661 Stiffness of right knee, not elsewhere classified: Secondary | ICD-10-CM

## 2021-07-23 DIAGNOSIS — M25662 Stiffness of left knee, not elsewhere classified: Secondary | ICD-10-CM

## 2021-07-23 NOTE — Therapy (Signed)
[facility] [place], [place], [address] Phone: [phone]   Fax:  [phone]  Physical Therapy Treatment  Patient Details  Name: Susan Haney MRN: [id number] Date of Birth: 03-22-1932 Referring Provider (PT): Cherlynn June PA-C   Encounter Date: 07/23/2021   PT End of Session - 07/23/21 0955     Visit Number 4    Number of Visits 12    Date for PT Re-Evaluation 08/04/21    Authorization Type OA, bilateral TKA's with a left knee revision on 02/12/16.  HTN, h/o low back and bilateral hip pain, CTR (right) bunionectomy.    PT Start Time 0945    PT Stop Time 1036    PT Time Calculation (min) 51 min    Equipment Utilized During Treatment Other (comment)   [place]   Activity Tolerance Patient tolerated treatment well    Behavior During Therapy WFL for tasks assessed/performed             Past Medical History:  Diagnosis Date   Anemia    history ,  took iron for years   Arthritis    Bicuspid aortic valve    Cancer ([place]) 2017   endometrial cancer / cervical   Cellulitis 12/31/2017   BLE, ankles   Chronic back pain    DDD (degenerative disc disease), lumbar    Endometrial cancer ([place])    GERD (gastroesophageal reflux disease)    History of radiation therapy 06/15/16-07/06/16   vaginal cuff 30 Gy in 5 fractions   Hypertension    Prediabetes    Spondylosis    lumbar    Past Surgical History:  Procedure Laterality Date   BUNIONECTOMY     CARPAL TUNNEL RELEASE Right 05/30/2018   Procedure: RIGHT CARPAL TUNNEL RELEASE;  Surgeon: Daryll Brod, MD;  Location: [facility];  Service: Orthopedics;  Laterality: Right;   COLONOSCOPY N/A 04/04/2014   Procedure: COLONOSCOPY;  Surgeon: Rogene Houston, MD;  [facility];  Service: Endoscopy;  Laterality: N/A;  100   EYE SURGERY     bil cataracts and lens implants   FOOT SURGERY     Left   Ovary removed: benign     REPLACEMENT TOTAL KNEE  BILATERAL     2013   ROBOTIC ASSISTED TOTAL HYSTERECTOMY WITH BILATERAL SALPINGO OOPHERECTOMY Left 04/13/2016   Procedure: XI ROBOTIC ASSISTED TOTAL HYSTERECTOMY WITH LEFT SALPINGO OOPHORECTOMY SENTINEL LYMPH NODE BIOPSY;  Surgeon: Everitt Amber, MD;  [facility];  Service: Gynecology;  Laterality: Left;   TOTAL KNEE REVISION Left 02/12/2016   Procedure: LEFT TOTAL KNEE REVISION;  Surgeon: Rod Can, MD;  [facility];  Service: Orthopedics;  Laterality: Left;    There were no vitals filed for this visit.   Subjective Assessment - 07/23/21 0948     Subjective Pt arrives for today's treatment session reporting 5/10 left knee pain.  Pt states that yesterday her pain got up to a 10/10 in B knees and hips.    Pertinent History OA, bilateral TKA's with a left knee revision on 02/12/16.  HTN, h/o low back and bilateral hip pain, CTR (right) bunionectomy.    How long can you stand comfortably? Varies.    How long can you walk comfortably? Walk around home and short community distances with a cane.    Patient Stated Goals Decrease pain and be able to do more.    Currently in Pain? Yes    Pain Score 5  Pain Location Knee    Pain Orientation Left    Pain Onset More than a month ago                               [facility] Adult PT Treatment/Exercise - 07/23/21 0001       Knee/Hip Exercises: Aerobic   Nustep Lvl 3, seat 6 x 15 mins      Knee/Hip Exercises: Seated   Long Arc Quad Strengthening;Both;2 sets;10 reps;Weights    Long Arc Quad Weight 2 lbs.    Ball Squeeze 20 reps with 3 sec hold    Clamshell with TheraBand Red   20 reps x 3 sec hold   Marching Strengthening;Both;2 sets;10 reps;Weights    Marching Weights 2 lbs.    Hamstring Curl Strengthening;Both;20 reps    Hamstring Limitations red tband      Modalities   Modalities Electrical Stimulation;Vasopneumatic      Electrical Stimulation   Electrical Stimulation Location L knee    Electrical  Stimulation Action IFC at 80-150 Hz    Electrical Stimulation Parameters 40% scan x 15 mins    Electrical Stimulation Goals Pain;Edema      Vasopneumatic   Number Minutes Vasopneumatic  15 minutes    Vasopnuematic Location  Knee    Vasopneumatic Pressure Low    Vasopneumatic Temperature  34                          PT Long Term Goals - 07/07/21 1210       PT LONG TERM GOAL #1   Title Patient will be independent with HEP    Time 4    Period Weeks    Status New      PT LONG TERM GOAL #2   Title Patient will demonstrate 115 degrees or greater of L knee flexion to improve ability to perform functional tasks.    Time 4    Period Weeks    Status New      PT LONG TERM GOAL #3   Title Full active right knee extension.    Time 4    Period Weeks    Status New      PT LONG TERM GOAL #4   Title Patient will reports ability to perform ADLs with less than 4/10 knee pain.    Time 4    Period Weeks    Status New                   Plan - 07/23/21 0955     Clinical Impression Statement Pt arrives for today's treatment session reporting 5/10 L knee pain.  Pt states that her pain in B hips, knees, and back got up to a 10/10, but was unable to pinpoint a cause.  Reviewed HEP with min cues required to avoid leaning with LAQ and seated marches.  Pt instructed to remain within painfree range with seated marches to avoid B hip pain.  Normal responses to vaso and estim noted.  Pt encouraged to continue performance of HEP as previously instructed.  Pt reported 3/10 left knee pain at completion of today's treatment session.    Personal Factors and Comorbidities Comorbidity 1;Comorbidity 2;Other    Examination-Activity Limitations Locomotion Level;Other;Stand;Stairs    Examination-Participation Restrictions Other;Yard Work;Cleaning    Stability/Clinical Decision Making Evolving/Moderate complexity    Rehab Potential Good    PT Frequency 2x /  week    PT Duration 6 weeks     PT Treatment/Interventions ADLs/Self Care Home Management;Cryotherapy;Electrical Stimulation;Moist Heat;Gait training;Stair training;Functional mobility training;Therapeutic activities;Therapeutic exercise;Neuromuscular re-education;Manual techniques;Patient/family education;Passive range of motion    PT Next Visit Plan Nustep, VMS to left quadriceps, knee machine (pain-free), rockerboard, hip exercises (ie:  abduction).  Vasopneumatic as needed.    Consulted and Agree with Plan of Care Patient             Patient will benefit from skilled therapeutic intervention in order to improve the following deficits and impairments:  Abnormal gait, Decreased activity tolerance, Decreased strength, Decreased range of motion, Pain, Increased edema  Visit Diagnosis: Chronic pain of right knee  Chronic pain of left knee  Stiffness of right knee, not elsewhere classified  Stiffness of left knee, not elsewhere classified     Problem List Patient Active Problem List   Diagnosis Date Noted   Chronic idiopathic constipation 06/17/2021   Stage 3b chronic kidney disease ([place]) 05/06/2021   Perianal irritation 01/20/2021   Essential hypertension 08/06/2019   Vitamin D deficiency 08/06/2019   Genetic testing 01/07/2017   Family history of prostate cancer    Family history of cancer    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 [place], [place], [address] Phone: [phone]   Fax:  [phone]  Name: Susan Haney MRN: [id number] Date of Birth: 08/19/1931

## 2021-07-27 ENCOUNTER — Other Ambulatory Visit: Payer: Self-pay

## 2021-07-27 ENCOUNTER — Ambulatory Visit: Payer: Medicare Other

## 2021-07-27 DIAGNOSIS — M25662 Stiffness of left knee, not elsewhere classified: Secondary | ICD-10-CM

## 2021-07-27 DIAGNOSIS — M25661 Stiffness of right knee, not elsewhere classified: Secondary | ICD-10-CM

## 2021-07-27 DIAGNOSIS — G8929 Other chronic pain: Secondary | ICD-10-CM

## 2021-07-27 DIAGNOSIS — M25562 Pain in left knee: Secondary | ICD-10-CM | POA: Diagnosis not present

## 2021-07-27 DIAGNOSIS — M25561 Pain in right knee: Secondary | ICD-10-CM

## 2021-07-27 NOTE — Therapy (Signed)
[facility] [place], [place], [address] Phone: [phone]   Fax:  [phone]  Physical Therapy Treatment  Patient Details  Name: Susan Haney MRN: [id number] Date of Birth: Dec 08, 1931 Referring Provider (PT): Cherlynn June PA-C   Encounter Date: 07/27/2021   PT End of Session - 07/27/21 0950     Visit Number 5    Number of Visits 12    Date for PT Re-Evaluation 08/04/21    Authorization Type OA, bilateral TKA's with a left knee revision on 02/12/16.  HTN, h/o low back and bilateral hip pain, CTR (right) bunionectomy.    PT Start Time 0945    PT Stop Time 1042    PT Time Calculation (min) 57 min    Equipment Utilized During Treatment Other (comment)   [place]   Activity Tolerance Patient tolerated treatment well    Behavior During Therapy WFL for tasks assessed/performed             Past Medical History:  Diagnosis Date   Anemia    history ,  took iron for years   Arthritis    Bicuspid aortic valve    Cancer ([place]) 2017   endometrial cancer / cervical   Cellulitis 12/31/2017   BLE, ankles   Chronic back pain    DDD (degenerative disc disease), lumbar    Endometrial cancer ([place])    GERD (gastroesophageal reflux disease)    History of radiation therapy 06/15/16-07/06/16   vaginal cuff 30 Gy in 5 fractions   Hypertension    Prediabetes    Spondylosis    lumbar    Past Surgical History:  Procedure Laterality Date   BUNIONECTOMY     CARPAL TUNNEL RELEASE Right 05/30/2018   Procedure: RIGHT CARPAL TUNNEL RELEASE;  Surgeon: Daryll Brod, MD;  Location: [facility];  Service: Orthopedics;  Laterality: Right;   COLONOSCOPY N/A 04/04/2014   Procedure: COLONOSCOPY;  Surgeon: Rogene Houston, MD;  [facility];  Service: Endoscopy;  Laterality: N/A;  100   EYE SURGERY     bil cataracts and lens implants   FOOT SURGERY     Left   Ovary removed: benign     REPLACEMENT TOTAL KNEE  BILATERAL     2013   ROBOTIC ASSISTED TOTAL HYSTERECTOMY WITH BILATERAL SALPINGO OOPHERECTOMY Left 04/13/2016   Procedure: XI ROBOTIC ASSISTED TOTAL HYSTERECTOMY WITH LEFT SALPINGO OOPHORECTOMY SENTINEL LYMPH NODE BIOPSY;  Surgeon: Everitt Amber, MD;  [facility];  Service: Gynecology;  Laterality: Left;   TOTAL KNEE REVISION Left 02/12/2016   Procedure: LEFT TOTAL KNEE REVISION;  Surgeon: Rod Can, MD;  [facility];  Service: Orthopedics;  Laterality: Left;    There were no vitals filed for this visit.   Subjective Assessment - 07/27/21 0950     Subjective Pt arrives for today's treatment session reporting 6/10 left knee pain.  Pt states that she hs been performing all exercises at home as instructed.    Pertinent History OA, bilateral TKA's with a left knee revision on 02/12/16.  HTN, h/o low back and bilateral hip pain, CTR (right) bunionectomy.    How long can you stand comfortably? Varies.    How long can you walk comfortably? Walk around home and short community distances with a cane.    Patient Stated Goals Decrease pain and be able to do more.    Currently in Pain? Yes    Pain Score 6     Pain  Location Knee    Pain Orientation Left    Pain Onset More than a month ago                [facility] PT Assessment - 07/27/21 0001       Observation/Other Assessments   Focus on Therapeutic Outcomes (FOTO)  55   07/27/21 5th visit                          [facility] Adult PT Treatment/Exercise - 07/27/21 0001       Knee/Hip Exercises: Stretches   Gastroc Stretch 3 reps;30 seconds      Knee/Hip Exercises: Aerobic   Nustep Lvl 3, seat 7 x 15 mins      Knee/Hip Exercises: Standing   Forward Lunges Both;20 reps   4 inch step   Forward Step Up Both;Hand Hold: 2;Step Height: 4";20 reps      Modalities   Modalities Psychologist, educational Location L knee    Electrical Stimulation Action IFC  at 80-150 Hz    Electrical Stimulation Parameters 40% scan x 15 mins    Electrical Stimulation Goals Pain;Edema      Vasopneumatic   Number Minutes Vasopneumatic  15 minutes    Vasopnuematic Location  Knee    Vasopneumatic Pressure Low    Vasopneumatic Temperature  34      Manual Therapy   Manual Therapy Soft tissue mobilization    Soft tissue mobilization STW/M to right calf to decrease pain and tone                          PT Long Term Goals - 07/27/21 0951       PT LONG TERM GOAL #1   Title Patient will be independent with HEP    Time 4    Period Weeks    Status On-going      PT LONG TERM GOAL #2   Title Patient will demonstrate 115 degrees or greater of L knee flexion to improve ability to perform functional tasks.    Time 4    Period Weeks    Status New      PT LONG TERM GOAL #3   Title Full active right knee extension.    Baseline 07/27/21: -2 degrees of extension in right knee    Time 4    Period Weeks    Status On-going      PT LONG TERM GOAL #4   Title Patient will reports ability to perform ADLs with less than 4/10 knee pain.    Baseline 07/27/21: Reports 5-6/10 pain with ADLs.    Time 4    Period Weeks    Status On-going                   Plan - 07/27/21 0951     Clinical Impression Statement Pt arrives for today's treatment session reporting 6/10 left knee pain and 3/10 right calf and shin pain.  Pt states that she has been performing her HEP at home as instructed and can tell that her legs are stronger and that her gait has improved.  Pt with continued concern about pain levels.  Pt able to increase FOTO score to 55 today.  Pt instructed in forward lunges and step ups with good carry over.  Pt requiring cues to avoid pulling up with BUEs and  pushing off opposite LE.  Pt instructed in gastroc stretch on rockerboard with which pt reports slight increase in pain.  STW/M performed to right gastroc to decrease pain and tone.  Normal  responses to vaso and estim noted.  Pt reported 3/10 left knee pain and 3/10 right LE pain at completion of today's treatment session.    Personal Factors and Comorbidities Comorbidity 1;Comorbidity 2;Other    Examination-Activity Limitations Locomotion Level;Other;Stand;Stairs    Examination-Participation Restrictions Other;Yard Work;Cleaning    Stability/Clinical Decision Making Evolving/Moderate complexity    Rehab Potential Good    PT Frequency 2x / week    PT Duration 6 weeks    PT Treatment/Interventions ADLs/Self Care Home Management;Cryotherapy;Electrical Stimulation;Moist Heat;Gait training;Stair training;Functional mobility training;Therapeutic activities;Therapeutic exercise;Neuromuscular re-education;Manual techniques;Patient/family education;Passive range of motion    PT Next Visit Plan Nustep, VMS to left quadriceps, knee machine (pain-free), rockerboard, hip exercises (ie:  abduction).  Vasopneumatic as needed.    Consulted and Agree with Plan of Care Patient             Patient will benefit from skilled therapeutic intervention in order to improve the following deficits and impairments:  Abnormal gait, Decreased activity tolerance, Decreased strength, Decreased range of motion, Pain, Increased edema  Visit Diagnosis: Chronic pain of right knee  Chronic pain of left knee  Stiffness of right knee, not elsewhere classified  Stiffness of left knee, not elsewhere classified     Problem List Patient Active Problem List   Diagnosis Date Noted   Chronic idiopathic constipation 06/17/2021   Stage 3b chronic kidney disease ([place]) 05/06/2021   Perianal irritation 01/20/2021   Essential hypertension 08/06/2019   Vitamin D deficiency 08/06/2019   Genetic testing 01/07/2017   Family history of prostate cancer    Family history of cancer    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

## 2021-07-28 ENCOUNTER — Ambulatory Visit (INDEPENDENT_AMBULATORY_CARE_PROVIDER_SITE_OTHER): Payer: Medicare Other | Admitting: Internal Medicine

## 2021-07-28 ENCOUNTER — Encounter (INDEPENDENT_AMBULATORY_CARE_PROVIDER_SITE_OTHER): Payer: Self-pay | Admitting: Internal Medicine

## 2021-07-28 VITALS — BP 154/77 | HR 80 | Temp 99.1°F | Ht 63.0 in | Wt 178.4 lb

## 2021-07-28 DIAGNOSIS — K5904 Chronic idiopathic constipation: Secondary | ICD-10-CM | POA: Diagnosis not present

## 2021-07-28 DIAGNOSIS — K219 Gastro-esophageal reflux disease without esophagitis: Secondary | ICD-10-CM | POA: Diagnosis not present

## 2021-07-28 MED ORDER — LUBIPROSTONE 8 MCG PO CAPS: 8.0000 ug | ORAL_CAPSULE | Freq: Two times a day (BID) | ORAL | 3 refills | Status: DC

## 2021-07-28 NOTE — Patient Instructions (Signed)
Try omeprazole 20 mg every other day.  If heartburn control is not satisfactory can go back to every day. Can go back on Colace if necessary. Use glycerin or Dulcolax suppository if you go more than 1 day without a bowel movement. Use Amitiza/lubiprostone 8 mcg twice daily 8 mcg alternating with 16 mcg every other day.

## 2021-07-28 NOTE — Progress Notes (Signed)
Presenting complaint;  Follow-up chronic constipation.  Database and subjective:  Patient is 88 year old Caucasian female who has a history of chronic constipation and GERD and is here for scheduled visit.  She was last seen on 01/20/2022. She has bowel movement on most days.  She rarely has formed stool.  Multiple stools are soft and loose.  She has gone 2 days without a bowel movement but this does not happen more than once or twice a month.  She tries not to take lubiprostone when she has a doctor's visit.  She denies melena or rectal bleeding.  If she was 2 days without she does notice some discomfort in her lower abdomen.  Appetite is good she has not lost any weight.  She states that she works 2 days without a bowel movement she eats less until her bowels are normal. Heartburn is well controlled with omeprazole.  She does not remember the last time she had an episode.  She has some cough which she believes is due to postnasal discharge.  She denies hoarseness or feeling of lump in her throat. She states her back pain has worsened and she is now getting physical therapy twice a week.  She also is doing exercises to strengthen upper and lower body muscles.  Lab data from  Current Medications: Outpatient Encounter Medications as of 07/28/2021  Medication Sig   aspirin EC 81 MG tablet Take 81 mg by mouth daily.   Biotin 1000 MCG tablet Take 1,000 mcg by mouth daily.   cetirizine (ZYRTEC) 10 MG tablet Take 1 tablet (10 mg total) by mouth daily.   cholecalciferol (VITAMIN D) 400 UNITS TABS tablet Take 2,000 Units by mouth.   dapagliflozin propanediol (FARXIGA) 10 MG TABS tablet Take 1 tablet (10 mg total) by mouth daily.   docusate sodium (COLACE) 100 MG capsule Take 100 mg by mouth 2 (two) times daily.   fluticasone (FLONASE) 50 MCG/ACT nasal spray SPRAY 2 SPRAYS INTO EACH NOSTRIL EVERY DAY   glucose blood (ACCU-CHEK AVIVA PLUS) test strip Use to check Blood Sugars 1-2 times daily. Dx E11.9    lisinopril-hydrochlorothiazide (ZESTORETIC) 20-25 MG tablet Take 1 tablet by mouth daily.   lubiprostone (AMITIZA) 8 MCG capsule Take 1 capsule (8 mcg total) by mouth 2 (two) times daily with a meal. (Patient taking differently: Take 8 mcg by mouth daily.)   Misc Natural Products (TART CHERRY ADVANCED) CAPS Take by mouth daily.    montelukast (SINGULAIR) 10 MG tablet Take 1 tablet (10 mg total) by mouth at bedtime.   Multiple Vitamins-Minerals (MULTIVITAMIN WITH MINERALS) tablet Take 1 tablet by mouth daily.   nystatin-triamcinolone (MYCOLOG II) cream Apply 1 application topically 2 (two) times daily.   Omega-3 Fatty Acids (MAXEPA PO) Take 500 mg by mouth daily at 6 (six) AM.   omeprazole (PRILOSEC) 20 MG capsule Take 1 capsule (20 mg total) by mouth daily.   psyllium (METAMUCIL) 58.6 % packet Take 1 packet by mouth daily.   triamcinolone cream (KENALOG) 0.5 % SMARTSIG:1 Sparingly Topical 3 Times Daily   No facility-administered encounter medications on file as of 07/28/2021.     Objective: Blood pressure (!) 154/77, pulse 80, temperature 99.1 F (37.3 C), temperature source Oral, height _0  (1.6 m), weight 178 lb 6.4 oz (80.9 kg). Patient is alert and in no acute distress alert Conjunctiva is pink. Sclera is nonicteric Oropharyngeal mucosa is normal. No neck masses or thyromegaly noted. Cardiac exam with regular rhythm normal S1 and S2. No murmur  or gallop noted. Lungs are clear to auscultation. Abdomen is symmetrical.  On palpation abdomen is soft.  Bowel sounds are epigastric region to the right of midline.  No masses or organomegaly. She has trace pitting edema around ankles left slightly worse than the right.  Labs/studies Results:   CBC Latest Ref Rng & Units 05/06/2021 05/16/2020 11/08/2019  WBC 3.4 - 10.8 x10E3/uL 6.0 7.5 8.4  Hemoglobin 11.1 - 15.9 g/dL 12.0 11.7 11.7  Hematocrit 34.0 - 46.6 % 36.3 36.0 36.5  Platelets 150 - 450 x10E3/uL 277 274 261    CMP Latest Ref Rng  & Units 05/06/2021 05/16/2020 02/07/2020  Glucose 70 - 99 mg/dL 98 88 112(H)  BUN 8 - 27 mg/dL _0 Creatinine 0.57 - 1.00 mg/dL 1.14(H) 1.22(H) 1.30(H)  Sodium 134 - 144 mmol/L 143 142 141  Potassium 3.5 - 5.2 mmol/L 4.1 4.1 4.2  Chloride 96 - 106 mmol/L 105 106 104  CO2 20 - 29 mmol/L _1 Calcium 8.7 - 10.3 mg/dL 9.9 9.2 9.1  Total Protein 6.0 - 8.5 g/dL 6.5 6.1 6.2  Total Bilirubin 0.0 - 1.2 mg/dL 0.3 0.2 0.3  Alkaline Phos 44 - 121 IU/L 57 52 55  AST 0 - 40 IU/L _2 ALT 0 - 32 IU/L _3 Hepatic Function Latest Ref Rng & Units 05/06/2021 05/16/2020 02/07/2020  Total Protein 6.0 - 8.5 g/dL 6.5 6.1 6.2  Albumin 3.6 - 4.6 g/dL 4.2 4.0 3.8  AST 0 - 40 IU/L _4 ALT 0 - 32 IU/L _5 Alk Phosphatase 44 - 121 IU/L 57 52 55  Total Bilirubin 0.0 - 1.2 mg/dL 0.3 0.2 0.3    Above lab data reviewed with patient.  TSH 1.640 on 06/02/2021.  Assessment:  #1.  Chronic constipation.  Bowels are not moving to her satisfaction at least she has not had an episode of fecal impaction.  Since her stools are predominantly soft to loose she can back off on Colace.  She should take lubiprostone daily which she can titrate dose within 8 to 16 mcg daily.  She may have to use 8 mcg alternating with 60 mcg every other day.  #2.  Chronic GERD.  She is doing well with low-dose omeprazole  Plan:  Discontinue Colace.  She can go back on it if she feels she needs it. New prescription for Amitiza 8 mcg twice daily sent to patient's pharmacy.  77-monthsupply with 3 refills. She will continue with high-fiber diet. Try omeprazole 20 mg every other day.  If this dose works we will change prescription at the time of refill office visit. Patient advised to use glycerin or Dulcolax suppository if she goes 1 day without a bowel movement. Office visit in 6 months Ms. CScherrie Gerlach NP.

## 2021-07-29 NOTE — Progress Notes (Signed)
°Radiation Oncology         [phone] °________________________________ ° °Name: Susan Haney MRN: [id number]  °Date: 07/30/2021  DOB: 07/17/1932 ° °Follow-Up Visit Note ° °CC: Susan Haney, Susan F, FNP  [name], [name], MD ° °  ICD-10-CM   °1. Endometrial cancer (HCC)  C54.1 Urinalysis, Complete w Microscopic  °  °2. Dysuria  R30.0 Urinalysis, Complete w Microscopic  °  ° ° °Diagnosis: Stage IB grade 2 endometrioid adenocarcinoma with high/intermediate risk factors and microsatellite instability on routine tumor testing ° °Interval Since Last Radiation: 5 years, and 24 days  ° °Radiation treatment dates:   06/15/2016, 06/22/2016, 06/29/2016, 07/01/2016, 07/06/2016 °  °Site/dose:  Vaginal Cuff / 30 Gy in 5 fractions ° °Narrative:  The patient returns today for routine annual follow-up, she was last seen here for follow up on 07/24/20. Since her last visit, the patient followed up with Dr. [name] on 01/28/21. During which time, the patient was noted to be NED on examination, and denied an vaginal bleeding or symptoms concerning for recurrence. The patient did report knee and back pain issues.  ° °Of note: the patient has been following up with her gastroenterologist, Dr. Rehman, in regards to her chronic constipation and GERD. During her most resent gastro follow up on 07/28/20, the patient reported rarely having formed stools, and sometimes going 2 days without a bowel movement. On those days without bowel movements, the patient reported lower abdominal discomfort. ° °The patient is also undergoing physical therapy twice a week for her worsening back pain.  ° °Pertinent imaging since the patient was last seen includes:  °--CT of the head on 10/23/20 which showed no evidence of acute intracranial abnormality.  °--Cervical spine XR on 05/06/21 showed multilevel degenerative disc disease and facet hypertrophy with °slight progression from prior exam. Bony neural foraminal narrowing was also appreciated at C3-C4 on the  left. ° °Since her last follow-up she denies any pelvic pain vaginal bleeding or discharge.  She does have some abdominal issues with chronic constipation and is followed by her gastroenterologist.  She is reporting dysuria for the past few days which is a new issue for her. ° °                         ° °Allergies:  has No Known Allergies. ° °Meds: °Current Outpatient Medications  °Medication Sig Dispense Refill  ° aspirin EC 81 MG tablet Take 81 mg by mouth daily.    ° Biotin 1000 MCG tablet Take 1,000 mcg by mouth daily.    ° cetirizine (ZYRTEC) 10 MG tablet Take 1 tablet (10 mg total) by mouth daily. 90 tablet 1  ° cholecalciferol (VITAMIN D) 400 UNITS TABS tablet Take 2,000 Units by mouth.    ° dapagliflozin propanediol (FARXIGA) 10 MG TABS tablet Take 1 tablet (10 mg total) by mouth daily. 90 tablet 1  ° fluticasone (FLONASE) 50 MCG/ACT nasal spray SPRAY 2 SPRAYS INTO EACH NOSTRIL EVERY DAY 48 mL 2  ° glucose blood (ACCU-CHEK AVIVA PLUS) test strip Use to check Blood Sugars 1-2 times daily. Dx E11.9 100 each 12  ° lidocaine (LIDODERM) 5 % lidocaine 5 % topical patch ° PLACE 1 PATCH ONTO THE SKIN DAILY. REMOVE & DISCARD PATCH WITHIN 12 HOURS OR AS DIRECTED BY MD    ° lisinopril-hydrochlorothiazide (ZESTORETIC) 20-25 MG tablet Take 1 tablet by mouth daily. 90 tablet 1  ° lubiprostone (AMITIZA) 8 MCG capsule Take 1 capsule (8   mcg total) by mouth 2 (two) times daily with a meal. 180 capsule 3  ° Misc Natural Products (TART CHERRY ADVANCED) CAPS Take by mouth daily.     ° montelukast (SINGULAIR) 10 MG tablet Take 1 tablet (10 mg total) by mouth at bedtime. 90 tablet 1  ° Multiple Vitamins-Minerals (MULTIVITAMIN WITH MINERALS) tablet Take 1 tablet by mouth daily.    ° nystatin-triamcinolone (MYCOLOG II) cream Apply 1 application topically 2 (two) times daily. 30 g 1  ° Omega-3 Fatty Acids (MAXEPA PO) Take 500 mg by mouth daily at 6 (six) AM.    ° omeprazole (PRILOSEC) 20 MG capsule Take 1 capsule (20 mg total) by  mouth daily. (Patient taking differently: Take 20 mg by mouth 3 (three) times a week.) 90 capsule 1  ° psyllium (METAMUCIL) 58.6 % packet Take 1 packet by mouth daily.    ° triamcinolone cream (KENALOG) 0.5 % SMARTSIG:1 Sparingly Topical 3 Times Daily    ° °No current facility-administered medications for this encounter.  ° ° °Physical Findings: °The patient is in no acute distress. Patient is alert and oriented. ° height is 5' 3" (1.6 m) and weight is 177 lb 8 oz (80.5 kg). Her temporal temperature is 97.2 °Haney (36.2 °C) (abnormal). Her blood pressure is 149/64 (abnormal) and her pulse is 77. Her respiration is 18 and oxygen saturation is 98%. .  No significant changes. Lungs are clear to auscultation bilaterally. Heart has regular rate and rhythm. No palpable cervical, supraclavicular, or axillary adenopathy. Abdomen soft, non-tender, normal bowel sounds. ° °On pelvic examination the external genitalia were unremarkable. A speculum exam was performed. There are no mucosal lesions noted in the vaginal vault.  No lesions noted along the vaginal cuff.  On bimanual and rectovaginal examination there are no pelvic masses appreciated. ° ° °Lab Findings: °Lab Results  °Component Value Date  ° WBC 6.0 05/06/2021  ° HGB 12.0 05/06/2021  ° HCT 36.3 05/06/2021  ° MCV 85 05/06/2021  ° PLT 277 05/06/2021  ° ° °Radiographic Findings: °No results found. ° °Impression: Stage IB grade 2 endometrioid adenocarcinoma with high/intermediate risk factors and microsatellite instability on routine tumor testing ° °No evidence of recurrence on clinical exam today.  I congratulated the patient on a successful 5-year follow-up with no evidence of recurrence.  Given the interval since diagnosis and her age she does not need to follow-up in radiation oncology or gynecologic oncology. ° °She did notice some dysuria today and we have sent her up to the lab for urinalysis culture and sensitivity.  If this shows an infection she will  placed on  antibiotics. ° °Plan: As needed follow-up in radiation oncology ° ° °25 minutes of total time was spent for this patient encounter, including preparation, face-to-face counseling with the patient and coordination of care, physical exam, and documentation of the encounter. °____________________________________ ° °James D. Kinard, PhD, MD ° ° °This document serves as a record of services personally performed by James Kinard, MD. It was created on his behalf by Elisa Frazier, a trained medical scribe. The creation of this record is based on the scribe's personal observations and the provider's statements to them. This document has been checked and approved by the attending provider. ° °

## 2021-07-30 ENCOUNTER — Ambulatory Visit
Admission: RE | Admit: 2021-07-30 | Discharge: 2021-07-30 | Disposition: A | Discharge status: Home / Self Care | Payer: Medicare Other | Source: Ambulatory Visit | Attending: Radiation Oncology | Admitting: Radiation Oncology

## 2021-07-30 ENCOUNTER — Other Ambulatory Visit: Payer: Self-pay

## 2021-07-30 ENCOUNTER — Encounter: Payer: Self-pay | Admitting: Radiation Oncology

## 2021-07-30 VITALS — BP 149/64 | HR 77 | Temp 97.2°F | Resp 18 | Ht 63.0 in | Wt 177.5 lb

## 2021-07-30 DIAGNOSIS — K5909 Other constipation: Secondary | ICD-10-CM | POA: Insufficient documentation

## 2021-07-30 DIAGNOSIS — Z7984 Long term (current) use of oral hypoglycemic drugs: Secondary | ICD-10-CM | POA: Insufficient documentation

## 2021-07-30 DIAGNOSIS — R300 Dysuria: Secondary | ICD-10-CM

## 2021-07-30 DIAGNOSIS — K219 Gastro-esophageal reflux disease without esophagitis: Secondary | ICD-10-CM | POA: Diagnosis not present

## 2021-07-30 DIAGNOSIS — Z923 Personal history of irradiation: Secondary | ICD-10-CM | POA: Insufficient documentation

## 2021-07-30 DIAGNOSIS — C541 Malignant neoplasm of endometrium: Secondary | ICD-10-CM

## 2021-07-30 DIAGNOSIS — Z79899 Other long term (current) drug therapy: Secondary | ICD-10-CM | POA: Insufficient documentation

## 2021-07-30 DIAGNOSIS — Z8542 Personal history of malignant neoplasm of other parts of uterus: Secondary | ICD-10-CM | POA: Diagnosis present

## 2021-07-30 DIAGNOSIS — M47819 Spondylosis without myelopathy or radiculopathy, site unspecified: Secondary | ICD-10-CM | POA: Diagnosis not present

## 2021-07-30 DIAGNOSIS — Z08 Encounter for follow-up examination after completed treatment for malignant neoplasm: Secondary | ICD-10-CM | POA: Diagnosis not present

## 2021-07-30 DIAGNOSIS — Z7982 Long term (current) use of aspirin: Secondary | ICD-10-CM | POA: Diagnosis not present

## 2021-07-30 LAB — URINALYSIS, COMPLETE (UACMP) WITH MICROSCOPIC
Bilirubin Urine: NEGATIVE
Glucose, UA: >=500 mg/dL — AB
Ketones, ur: 5 mg/dL — AB
Leukocytes,Ua: NEGATIVE
Nitrite: NEGATIVE
Protein, ur: NEGATIVE mg/dL
Specific Gravity, Urine: 1.022 (ref 1.005–1.030)
pH: 5 (ref 5.0–8.0)

## 2021-07-30 NOTE — Progress Notes (Signed)
Susan Haney is here today for follow up post radiation to the pelvic.  They completed their radiation on: 07/06/2016   Does the patient complain of any of the following:  Pain:bilateral knee and back pain, in physical therapy for this currently Abdominal bloating: denies Diarrhea/Constipation: constipation Nausea/Vomiting: denies Vaginal Discharge: denies Blood in Urine or Stool: denies Urinary Issues (dysuria/incomplete emptying/ incontinence/ increased frequency/urgency): dysuria, weak urine stream, incomplete bladder emptying, nocturia x1-2 Does patient report using vaginal dilator 2-3 times a week and/or sexually active 2-3 weeks: no Post radiation skin changes: not applicable   Additional comments if applicable: none  Vitals:   07/30/21 0938  BP: (!) 149/64  Pulse: 77  Resp: 18  Temp: (!) 97.2 F (36.2 C)  TempSrc: Temporal  SpO2: 98%  Weight: 177 lb 8 oz (80.5 kg)  Height: 5\' 3"  (1.6 m)

## 2021-07-31 ENCOUNTER — Other Ambulatory Visit: Payer: Self-pay

## 2021-07-31 ENCOUNTER — Ambulatory Visit: Payer: Medicare Other | Admitting: *Deleted

## 2021-07-31 DIAGNOSIS — M25661 Stiffness of right knee, not elsewhere classified: Secondary | ICD-10-CM

## 2021-07-31 DIAGNOSIS — M25662 Stiffness of left knee, not elsewhere classified: Secondary | ICD-10-CM | POA: Diagnosis not present

## 2021-07-31 DIAGNOSIS — M25561 Pain in right knee: Secondary | ICD-10-CM | POA: Diagnosis not present

## 2021-07-31 DIAGNOSIS — M25562 Pain in left knee: Secondary | ICD-10-CM | POA: Diagnosis not present

## 2021-07-31 DIAGNOSIS — G8929 Other chronic pain: Secondary | ICD-10-CM

## 2021-07-31 LAB — URINE CULTURE: Culture: NO GROWTH

## 2021-07-31 NOTE — Therapy (Signed)
[facility] [place], [place], [address] Phone: [phone]   Fax:  [phone]  Physical Therapy Treatment  Patient Details  Name: Susan Haney MRN: [id number] Date of Birth: 28-Oct-1931 Referring Provider (PT): Cherlynn June PA-C   Encounter Date: 07/31/2021   PT End of Session - 07/31/21 0944     Visit Number 6    Number of Visits 12    Date for PT Re-Evaluation 08/04/21    Authorization Type OA, bilateral TKA's with a left knee revision on 02/12/16.  HTN, h/o low back and bilateral hip pain, CTR (right) bunionectomy.    PT Start Time 0945    PT Stop Time 9166    PT Time Calculation (min) 54 min             Past Medical History:  Diagnosis Date   Anemia    history ,  took iron for years   Arthritis    Bicuspid aortic valve    Cancer ([place]) 2017   endometrial cancer / cervical   Cellulitis 12/31/2017   BLE, ankles   Chronic back pain    DDD (degenerative disc disease), lumbar    Endometrial cancer ([place])    GERD (gastroesophageal reflux disease)    History of radiation therapy 06/15/16-07/06/16   vaginal cuff 30 Gy in 5 fractions   Hypertension    Prediabetes    Spondylosis    lumbar    Past Surgical History:  Procedure Laterality Date   BUNIONECTOMY     CARPAL TUNNEL RELEASE Right 05/30/2018   Procedure: RIGHT CARPAL TUNNEL RELEASE;  Surgeon: Daryll Brod, MD;  Location: [facility];  Service: Orthopedics;  Laterality: Right;   COLONOSCOPY N/A 04/04/2014   Procedure: COLONOSCOPY;  Surgeon: Rogene Houston, MD;  Location: [facility];  Service: Endoscopy;  Laterality: N/A;  100   EYE SURGERY     bil cataracts and lens implants   FOOT SURGERY     Left   Ovary removed: benign     REPLACEMENT TOTAL KNEE BILATERAL     2013   ROBOTIC ASSISTED TOTAL HYSTERECTOMY WITH BILATERAL SALPINGO OOPHERECTOMY Left 04/13/2016   Procedure: XI ROBOTIC ASSISTED TOTAL HYSTERECTOMY WITH LEFT SALPINGO  OOPHORECTOMY SENTINEL LYMPH NODE BIOPSY;  Surgeon: Everitt Amber, MD;  Location: [facility];  Service: Gynecology;  Laterality: Left;   TOTAL KNEE REVISION Left 02/12/2016   Procedure: LEFT TOTAL KNEE REVISION;  Surgeon: Rod Can, MD;  Location: [facility];  Service: Orthopedics;  Laterality: Left;    There were no vitals filed for this visit.   Subjective Assessment - 07/31/21 0942     Subjective Pt arrives for today's treatment session reporting 6/10 left knee pain still. Doing ok    Pertinent History OA, bilateral TKA's with a left knee revision on 02/12/16.  HTN, h/o low back and bilateral hip pain, CTR (right) bunionectomy.    How long can you stand comfortably? Varies.    How long can you walk comfortably? Walk around home and short community distances with a cane.    Patient Stated Goals Decrease pain and be able to do more.    Pain Location Knee    Pain Orientation Left    Pain Descriptors / Indicators Burning    Pain Type Chronic pain    Pain Onset More than a month ago  [facility] Adult PT Treatment/Exercise - 07/31/21 0001       Knee/Hip Exercises: Aerobic   Nustep Lvl 3, seat 8 x 15 mins      Knee/Hip Exercises: Standing   Heel Raises Both;2 sets;10 reps    Forward Lunges Both;2 sets;10 reps   8 inch step   Rocker Board 4 minutes      Knee/Hip Exercises: Seated   Long Arc Quad Both;2 sets;Weights    Long Arc Quad Weight 4 lbs.      Modalities   Modalities Psychologist, educational Location L knee    Electrical Stimulation Action Premod x 10 mins    Electrical Stimulation Parameters 80-150hz     Electrical Stimulation Goals Pain;Edema      Vasopneumatic   Number Minutes Vasopneumatic  10 minutes    Vasopnuematic Location  Knee    Vasopneumatic Pressure Low    Vasopneumatic Temperature  34 for edema      Manual Therapy   Manual Therapy --                           PT Long Term Goals - 07/27/21 0951       PT LONG TERM GOAL #1   Title Patient will be independent with HEP    Time 4    Period Weeks    Status On-going      PT LONG TERM GOAL #2   Title Patient will demonstrate 115 degrees or greater of L knee flexion to improve ability to perform functional tasks.    Time 4    Period Weeks    Status New      PT LONG TERM GOAL #3   Title Full active right knee extension.    Baseline 07/27/21: -2 degrees of extension in right knee    Time 4    Period Weeks    Status On-going      PT LONG TERM GOAL #4   Title Patient will reports ability to perform ADLs with less than 4/10 knee pain.    Baseline 07/27/21: Reports 5-6/10 pain with ADLs.    Time 4    Period Weeks    Status On-going                   Plan - 07/31/21 0944     Clinical Impression Statement Pt arrived today doing better overall with decreased knee pain. She was able to continue with therex, but still continues having to ambulate with LT knee locked to prevent buckling. Pt did well with LAQs today with wts.    Personal Factors and Comorbidities Comorbidity 1;Comorbidity 2;Other    Examination-Activity Limitations Locomotion Level;Other;Stand;Stairs    Examination-Participation Restrictions Other;Yard Work;Cleaning    Stability/Clinical Decision Making Evolving/Moderate complexity    Rehab Potential Good    PT Frequency 2x / week    PT Duration 6 weeks    PT Treatment/Interventions ADLs/Self Care Home Management;Cryotherapy;Electrical Stimulation;Moist Heat;Gait training;Stair training;Functional mobility training;Therapeutic activities;Therapeutic exercise;Neuromuscular re-education;Manual techniques;Patient/family education;Passive range of motion    PT Next Visit Plan Nustep, VMS to left quadriceps, knee machine (pain-free), rockerboard, hip exercises (ie:  abduction).  Vasopneumatic as needed.    Consulted and Agree with Plan of Care Patient              Patient will benefit from skilled therapeutic intervention in order to improve the following deficits and impairments:  Abnormal gait, Decreased activity tolerance, Decreased strength, Decreased range of motion, Pain, Increased edema  Visit Diagnosis: Chronic pain of right knee  Chronic pain of left knee  Stiffness of right knee, not elsewhere classified  Stiffness of left knee, not elsewhere classified     Problem List Patient Active Problem List   Diagnosis Date Noted   Chronic idiopathic constipation 06/17/2021   Stage 3b chronic kidney disease ([place]) 05/06/2021   Perianal irritation 01/20/2021   Essential hypertension 08/06/2019   Vitamin D deficiency 08/06/2019   Genetic testing 01/07/2017   Family history of prostate cancer    Family history of cancer    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 10/20/2016   Endometrial cancer ([place]) 04/13/2016   GERD (gastroesophageal reflux disease) 06/30/2015   Chronic lower back pain 06/18/2014   Prediabetes 02/21/2014   Constipation 02/21/2014    Nasire Reali,CHRIS, PTA 07/31/2021, 12:48 PM  [facility] [address] [place], [place], [address] Phone: [phone]   Fax:  [phone]  Name: TESSY PAWELSKI MRN: [id number] Date of Birth: 1932/03/14

## 2021-08-04 ENCOUNTER — Ambulatory Visit: Payer: Medicare Other | Admitting: *Deleted

## 2021-08-04 ENCOUNTER — Other Ambulatory Visit: Payer: Self-pay

## 2021-08-04 DIAGNOSIS — G8929 Other chronic pain: Secondary | ICD-10-CM | POA: Diagnosis not present

## 2021-08-04 DIAGNOSIS — M25561 Pain in right knee: Secondary | ICD-10-CM | POA: Diagnosis not present

## 2021-08-04 DIAGNOSIS — M25661 Stiffness of right knee, not elsewhere classified: Secondary | ICD-10-CM | POA: Diagnosis not present

## 2021-08-04 DIAGNOSIS — M25562 Pain in left knee: Secondary | ICD-10-CM | POA: Diagnosis not present

## 2021-08-04 DIAGNOSIS — M25662 Stiffness of left knee, not elsewhere classified: Secondary | ICD-10-CM | POA: Diagnosis not present

## 2021-08-04 NOTE — Therapy (Signed)
[facility] [place], [place], [address] Phone: [phone]   Fax:  [phone]  Physical Therapy Treatment  Patient Details  Name: Susan Haney MRN: [id number] Date of Birth: 1932/01/12 Referring Provider (PT): Cherlynn June PA-C   Encounter Date: 08/04/2021   PT End of Session - 08/04/21 0911     Visit Number 7    Number of Visits 12    Date for PT Re-Evaluation 08/04/21    Authorization Type OA, bilateral TKA's with a left knee revision on 02/12/16.  HTN, h/o low back and bilateral hip pain, CTR (right) bunionectomy.    PT Start Time 0900    PT Stop Time 0951    PT Time Calculation (min) 51 min             Past Medical History:  Diagnosis Date   Anemia    history ,  took iron for years   Arthritis    Bicuspid aortic valve    Cancer ([place]) 2017   endometrial cancer / cervical   Cellulitis 12/31/2017   BLE, ankles   Chronic back pain    DDD (degenerative disc disease), lumbar    Endometrial cancer ([place])    GERD (gastroesophageal reflux disease)    History of radiation therapy 06/15/16-07/06/16   vaginal cuff 30 Gy in 5 fractions   Hypertension    Prediabetes    Spondylosis    lumbar    Past Surgical History:  Procedure Laterality Date   BUNIONECTOMY     CARPAL TUNNEL RELEASE Right 05/30/2018   Procedure: RIGHT CARPAL TUNNEL RELEASE;  Surgeon: Daryll Brod, MD;  Location: [facility];  Service: Orthopedics;  Laterality: Right;   COLONOSCOPY N/A 04/04/2014   Procedure: COLONOSCOPY;  Surgeon: Rogene Houston, MD;  [facility];  Service: Endoscopy;  Laterality: N/A;  100   EYE SURGERY     bil cataracts and lens implants   FOOT SURGERY     Left   Ovary removed: benign     REPLACEMENT TOTAL KNEE BILATERAL     2013   ROBOTIC ASSISTED TOTAL HYSTERECTOMY WITH BILATERAL SALPINGO OOPHERECTOMY Left 04/13/2016   Procedure: XI ROBOTIC ASSISTED TOTAL HYSTERECTOMY WITH LEFT SALPINGO  OOPHORECTOMY SENTINEL LYMPH NODE BIOPSY;  Surgeon: Everitt Amber, MD;  [facility]: [facility];  Service: Gynecology;  Laterality: Left;   TOTAL KNEE REVISION Left 02/12/2016   Procedure: LEFT TOTAL KNEE REVISION;  Surgeon: Rod Can, MD;  [facility];  Service: Orthopedics;  Laterality: Left;    There were no vitals filed for this visit.   Subjective Assessment - 08/04/21 0909     Subjective Pt arrives for today's treatment session reporting 6/10 left knee pain still.  Doing HEP    Pertinent History OA, bilateral TKA's with a left knee revision on 02/12/16.  HTN, h/o low back and bilateral hip pain, CTR (right) bunionectomy.    How long can you stand comfortably? Varies.    How long can you walk comfortably? Walk around home and short community distances with a cane.    Patient Stated Goals Decrease pain and be able to do more.    Currently in Pain? Yes    Pain Score 6     Pain Location Knee    Pain Orientation Left                               [facility] Adult  PT Treatment/Exercise - 08/04/21 0001       Knee/Hip Exercises: Aerobic   Nustep Lvl 3, seat 8 x 15 mins      Knee/Hip Exercises: Standing   Heel Raises Both;2 sets;10 reps    Heel Raises Limitations toe raises 2x10    Forward Lunges Both;2 sets;10 reps   for quad control while stepping forward   Rocker Board 4 minutes      Knee/Hip Exercises: Seated   Long Arc Quad Both;2 sets;Weights    Long Arc Quad Weight 4 lbs.      Modalities   Modalities Electrical Stimulation;Vasopneumatic;Moist Heat      Moist Heat Therapy   Number Minutes Moist Heat 12 Minutes    Moist Heat Location Knee      Electrical Stimulation   Electrical Stimulation Location L knee    Electrical Stimulation Action premod    Electrical Stimulation Parameters 80-150 x 12 mins    Electrical Stimulation Goals Pain;Edema                          PT Long Term Goals - 07/27/21 0951       PT LONG TERM GOAL #1    Title Patient will be independent with HEP    Time 4    Period Weeks    Status On-going      PT LONG TERM GOAL #2   Title Patient will demonstrate 115 degrees or greater of L knee flexion to improve ability to perform functional tasks.    Time 4    Period Weeks    Status New      PT LONG TERM GOAL #3   Title Full active right knee extension.    Baseline 07/27/21: -2 degrees of extension in right knee    Time 4    Period Weeks    Status On-going      PT LONG TERM GOAL #4   Title Patient will reports ability to perform ADLs with less than 4/10 knee pain.    Baseline 07/27/21: Reports 5-6/10 pain with ADLs.    Time 4    Period Weeks    Status On-going                   Plan - 08/04/21 0912     Clinical Impression Statement Pt arrived today doing about the same with Bil knee pain and reports performing some exs at home. Pt was able to perform OKC and CKC exercises  for both LE's and did well. Pt did fairly well with LT knee quad control and is more challeng ed with control with CKC exs. Still ambulates with LT knee in extension    Personal Factors and Comorbidities Comorbidity 1;Comorbidity 2;Other    Examination-Activity Limitations Locomotion Level;Other;Stand;Stairs    Stability/Clinical Decision Making Evolving/Moderate complexity    Rehab Potential Good    PT Duration 6 weeks    PT Treatment/Interventions ADLs/Self Care Home Management;Cryotherapy;Electrical Stimulation;Moist Heat;Gait training;Stair training;Functional mobility training;Therapeutic activities;Therapeutic exercise;Neuromuscular re-education;Manual techniques;Patient/family education;Passive range of motion    PT Next Visit Plan Nustep, VMS to left quadriceps, knee machine (pain-free), rockerboard, hip exercises (ie:  abduction).  Vasopneumatic as needed.    Consulted and Agree with Plan of Care Patient             Patient will benefit from skilled therapeutic intervention in order to improve the  following deficits and impairments:  Abnormal gait, Decreased activity tolerance, Decreased strength,  Decreased range of motion, Pain, Increased edema  Visit Diagnosis: Chronic pain of right knee  Chronic pain of left knee     Problem List Patient Active Problem List   Diagnosis Date Noted   Chronic idiopathic constipation 06/17/2021   Stage 3b chronic kidney disease ([place]) 05/06/2021   Perianal irritation 01/20/2021   Essential hypertension 08/06/2019   Vitamin D deficiency 08/06/2019   Genetic testing 01/07/2017   Family history of prostate cancer    Family history of cancer    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 10/20/2016   Endometrial cancer ([place]) 04/13/2016   GERD (gastroesophageal reflux disease) 06/30/2015   Chronic lower back pain 06/18/2014   Prediabetes 02/21/2014   Constipation 02/21/2014    Neithan Day,CHRIS, PTA 08/04/2021, 11:49 AM  [facility] [address] [place], [place], [address] Phone: [phone]   Fax:  [phone]  Name: Susan Haney MRN: [id number] Date of Birth: 06/05/1932

## 2021-08-11 ENCOUNTER — Other Ambulatory Visit: Payer: Self-pay

## 2021-08-11 ENCOUNTER — Ambulatory Visit: Payer: Medicare Other | Admitting: *Deleted

## 2021-08-11 DIAGNOSIS — M25662 Stiffness of left knee, not elsewhere classified: Secondary | ICD-10-CM

## 2021-08-11 DIAGNOSIS — M25661 Stiffness of right knee, not elsewhere classified: Secondary | ICD-10-CM

## 2021-08-11 DIAGNOSIS — M25561 Pain in right knee: Secondary | ICD-10-CM

## 2021-08-11 DIAGNOSIS — M25562 Pain in left knee: Secondary | ICD-10-CM | POA: Diagnosis not present

## 2021-08-11 DIAGNOSIS — G8929 Other chronic pain: Secondary | ICD-10-CM | POA: Diagnosis not present

## 2021-08-11 NOTE — Therapy (Signed)
[facility] [place], [place], [address] Phone: [phone]   Fax:  [phone]  Physical Therapy Treatment  Patient Details  Name: Susan Haney MRN: [id number] Date of Birth: 30-Aug-1931 Referring Provider (PT): Cherlynn June PA-C   Encounter Date: 08/11/2021   PT End of Session - 08/11/21 1159     Visit Number 8    Number of Visits 12    Date for PT Re-Evaluation 08/04/21    Authorization Type OA, bilateral TKA's with a left knee revision on 02/12/16.  HTN, h/o low back and bilateral hip pain, CTR (right) bunionectomy.    PT Start Time 0945    PT Stop Time 5945    PT Time Calculation (min) 50 min             Past Medical History:  Diagnosis Date   Anemia    history ,  took iron for years   Arthritis    Bicuspid aortic valve    Cancer ([place]) 2017   endometrial cancer / cervical   Cellulitis 12/31/2017   BLE, ankles   Chronic back pain    DDD (degenerative disc disease), lumbar    Endometrial cancer ([place])    GERD (gastroesophageal reflux disease)    History of radiation therapy 06/15/16-07/06/16   vaginal cuff 30 Gy in 5 fractions   Hypertension    Prediabetes    Spondylosis    lumbar    Past Surgical History:  Procedure Laterality Date   BUNIONECTOMY     CARPAL TUNNEL RELEASE Right 05/30/2018   Procedure: RIGHT CARPAL TUNNEL RELEASE;  Surgeon: Daryll Brod, MD;  Location: [facility];  Service: Orthopedics;  Laterality: Right;   COLONOSCOPY N/A 04/04/2014   Procedure: COLONOSCOPY;  Surgeon: Rogene Houston, MD;  [facility];  Service: Endoscopy;  Laterality: N/A;  100   EYE SURGERY     bil cataracts and lens implants   FOOT SURGERY     Left   Ovary removed: benign     REPLACEMENT TOTAL KNEE BILATERAL     2013   ROBOTIC ASSISTED TOTAL HYSTERECTOMY WITH BILATERAL SALPINGO OOPHERECTOMY Left 04/13/2016   Procedure: XI ROBOTIC ASSISTED TOTAL HYSTERECTOMY WITH LEFT SALPINGO  OOPHORECTOMY SENTINEL LYMPH NODE BIOPSY;  Surgeon: Everitt Amber, MD;  [facility];  Service: Gynecology;  Laterality: Left;   TOTAL KNEE REVISION Left 02/12/2016   Procedure: LEFT TOTAL KNEE REVISION;  Surgeon: Rod Can, MD;  [facility];  Service: Orthopedics;  Laterality: Left;    There were no vitals filed for this visit.   Subjective Assessment - 08/11/21 1004     Subjective Pt arrives for today's treatment session reporting 7-8/10 left knee pain still.   LBP today    Pertinent History OA, bilateral TKA's with a left knee revision on 02/12/16.  HTN, h/o low back and bilateral hip pain, CTR (right) bunionectomy.    How long can you walk comfortably? Walk around home and short community distances with a cane.    Patient Stated Goals Decrease pain and be able to do more.    Pain Score 7     Pain Location Knee    Pain Orientation Left                               [facility] Adult PT Treatment/Exercise - 08/11/21 0001       Knee/Hip Exercises: Aerobic  Nustep Lvl 3, seat 8 x 15 mins      Knee/Hip Exercises: Seated   Long Arc Quad Both;2 sets;Weights    Long Arc Quad [employer] --   2# LT 3x10 ,RT 4#     Modalities   Modalities Electrical Stimulation;Vasopneumatic;Moist Heat      Moist Heat Therapy   Number Minutes Moist Heat 12 Minutes    Moist Heat Location Knee      Electrical Stimulation   Electrical Stimulation Location L knee    Electrical Stimulation Action microcurrent    Electrical Stimulation Parameters 195/40 x 15 mins    Electrical Stimulation Goals Pain;Edema      Manual Therapy   Manual Therapy Soft tissue mobilization    Soft tissue mobilization STW/M LT knee lateral aspect and ITB                          PT Long Term Goals - 07/27/21 0951       PT LONG TERM GOAL #1   Title Patient will be independent with HEP    Time 4    Period Weeks    Status On-going      PT LONG TERM GOAL #2   Title Patient will  demonstrate 115 degrees or greater of L knee flexion to improve ability to perform functional tasks.    Time 4    Period Weeks    Status New      PT LONG TERM GOAL #3   Title Full active right knee extension.    Baseline 07/27/21: -2 degrees of extension in right knee    Time 4    Period Weeks    Status On-going      PT LONG TERM GOAL #4   Title Patient will reports ability to perform ADLs with less than 4/10 knee pain.    Baseline 07/27/21: Reports 5-6/10 pain with ADLs.    Time 4    Period Weeks    Status On-going                   Plan - 08/11/21 1201     Clinical Impression Statement Pt arrived today doing fair , but requested not to perform the standing exs today due to LBP. Rx focused on quad strengthening and STW to LT knee/ ITB. Notable tightness/soreness in ITB. Microcurrent performed to LT knee and tolerated well.    Personal Factors and Comorbidities Comorbidity 1;Comorbidity 2;Other    Examination-Activity Limitations Locomotion Level;Other;Stand;Stairs    Rehab Potential Good    PT Frequency 2x / week    PT Duration 6 weeks    PT Treatment/Interventions ADLs/Self Care Home Management;Cryotherapy;Electrical Stimulation;Moist Heat;Gait training;Stair training;Functional mobility training;Therapeutic activities;Therapeutic exercise;Neuromuscular re-education;Manual techniques;Patient/family education;Passive range of motion    PT Next Visit Plan Nustep, VMS to left quadriceps, knee machine (pain-free), rockerboard, hip exercises (ie:  abduction).  Vasopneumatic as needed.    Consulted and Agree with Plan of Care Patient             Patient will benefit from skilled therapeutic intervention in order to improve the following deficits and impairments:  Abnormal gait, Decreased activity tolerance, Decreased strength, Decreased range of motion, Pain, Increased edema  Visit Diagnosis: Chronic pain of right knee  Chronic pain of left knee  Stiffness of right  knee, not elsewhere classified  Stiffness of left knee, not elsewhere classified     Problem List Patient Active Problem List  Diagnosis Date Noted   Chronic idiopathic constipation 06/17/2021   Stage 3b chronic kidney disease ([place]) 05/06/2021   Perianal irritation 01/20/2021   Essential hypertension 08/06/2019   Vitamin D deficiency 08/06/2019   Genetic testing 01/07/2017   Family history of prostate cancer    Family history of cancer    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

## 2021-08-13 ENCOUNTER — Other Ambulatory Visit: Payer: Self-pay

## 2021-08-13 ENCOUNTER — Ambulatory Visit: Payer: Medicare Other | Admitting: *Deleted

## 2021-08-13 DIAGNOSIS — M25561 Pain in right knee: Secondary | ICD-10-CM | POA: Diagnosis not present

## 2021-08-13 DIAGNOSIS — M25662 Stiffness of left knee, not elsewhere classified: Secondary | ICD-10-CM

## 2021-08-13 DIAGNOSIS — M25661 Stiffness of right knee, not elsewhere classified: Secondary | ICD-10-CM

## 2021-08-13 DIAGNOSIS — G8929 Other chronic pain: Secondary | ICD-10-CM | POA: Diagnosis not present

## 2021-08-13 DIAGNOSIS — M25562 Pain in left knee: Secondary | ICD-10-CM | POA: Diagnosis not present

## 2021-08-13 NOTE — Therapy (Signed)
[facility] [place], [place], [address] Phone: [phone]   Fax:  [phone]  Physical Therapy Treatment  Patient Details  Name: Susan Haney MRN: [id number] Date of Birth: [birth year] Referring Provider (PT): Cherlynn June PA-C   Encounter Date: 08/13/2021   PT End of Session - 08/13/21 0953     Visit Number 9    Number of Visits 12    Date for PT Re-Evaluation 08/04/21    Authorization Type OA, bilateral TKA's with a left knee revision on 02/12/16.  HTN, h/o low back and bilateral hip pain, CTR (right) bunionectomy.    PT Start Time 0945    PT Stop Time 1751    PT Time Calculation (min) 50 min    Equipment Utilized During Treatment Other (comment)    Activity Tolerance Patient tolerated treatment well    Behavior During Therapy WFL for tasks assessed/performed             Past Medical History:  Diagnosis Date   Anemia    history ,  took iron for years   Arthritis    Bicuspid aortic valve    Cancer ([place]) 2017   endometrial cancer / cervical   Cellulitis 12/31/2017   BLE, ankles   Chronic back pain    DDD (degenerative disc disease), lumbar    Endometrial cancer ([facility])    GERD (gastroesophageal reflux disease)    History of radiation therapy 06/15/16-07/06/16   vaginal cuff 30 Gy in 5 fractions   Hypertension    Prediabetes    Spondylosis    lumbar    Past Surgical History:  Procedure Laterality Date   BUNIONECTOMY     CARPAL TUNNEL RELEASE Right 05/30/2018   Procedure: RIGHT CARPAL TUNNEL RELEASE;  Surgeon: Daryll Brod, MD;  Location: [facility];  Service: Orthopedics;  Laterality: Right;   COLONOSCOPY N/A 04/04/2014   Procedure: COLONOSCOPY;  Surgeon: Rogene Houston, MD;  [facility];  Service: Endoscopy;  Laterality: N/A;  100   EYE SURGERY     bil cataracts and lens implants   FOOT SURGERY     Left   Ovary removed: benign     REPLACEMENT TOTAL KNEE BILATERAL      2013   ROBOTIC ASSISTED TOTAL HYSTERECTOMY WITH BILATERAL SALPINGO OOPHERECTOMY Left 04/13/2016   Procedure: XI ROBOTIC ASSISTED TOTAL HYSTERECTOMY WITH LEFT SALPINGO OOPHORECTOMY SENTINEL LYMPH NODE BIOPSY;  Surgeon: Everitt Amber, MD;  [facility];  Service: Gynecology;  Laterality: Left;   TOTAL KNEE REVISION Left 02/12/2016   Procedure: LEFT TOTAL KNEE REVISION;  Surgeon: Rod Can, MD;  [facility];  Service: Orthopedics;  Laterality: Left;    There were no vitals filed for this visit.   Subjective Assessment - 08/13/21 0951     Subjective Pt arrives for today's treatment session reporting 6-7/10  left knee pain still.  Did better after last RX    Pertinent History OA, bilateral TKA's with a left knee revision on 02/12/16.  HTN, h/o low back and bilateral hip pain, CTR (right) bunionectomy.    How long can you stand comfortably? Varies.    How long can you walk comfortably? Walk around home and short community distances with a cane.    Currently in Pain? Yes    Pain Score 6     Pain Location Knee    Pain Orientation Left    Pain Descriptors / Indicators Burning    Pain Type Chronic  pain                               [facility] Adult PT Treatment/Exercise - 08/13/21 0001       Knee/Hip Exercises: Aerobic   Nustep Lvl 3, seat 8 x 15 mins      Knee/Hip Exercises: Seated   Long Arc Quad Both;2 sets;Weights    Long Arc Quad Weight --   2# LT 3x10 ,RT 4#     Modalities   Modalities Electrical Stimulation;Vasopneumatic;Moist Heat      Moist Heat Therapy   Number Minutes Moist Heat 15 Minutes    Moist Heat Location Knee      Electrical Stimulation   Electrical Stimulation Location L knee    Electrical Stimulation Action microcurrent    Electrical Stimulation Parameters 195/40 x15 mins    Electrical Stimulation Goals Pain;Edema      Manual Therapy   Manual Therapy Soft tissue mobilization    Soft tissue mobilization STW/M LT knee lateral aspect  and ITB                          PT Long Term Goals - 08/13/21 1008       PT LONG TERM GOAL #1   Title Patient will be independent with HEP    Time 4    Period Weeks    Status Partially Met      PT LONG TERM GOAL #2   Title Patient will demonstrate 115 degrees or greater of L knee flexion to improve ability to perform functional tasks.    Baseline 115 degrees. 08-13-21    Time 4    Period Weeks    Status Achieved      PT LONG TERM GOAL #3   Title Full active right knee extension.    Baseline 07/27/21: -2 degrees of extension in right knee    Time 4    Period Weeks    Status On-going      PT LONG TERM GOAL #4   Title Patient will reports ability to perform ADLs with less than 4/10 knee pain.    Baseline 07/27/21: Reports 5-6/10 pain with ADLs.    Time 4    Period Weeks    Status On-going                   Plan - 08/13/21 1027     Clinical Impression Statement Pt arrived today reporting doing better after last Rx with decreased pain LT knee. Rx focused on decreasing knee pain and strengthening both knees, but with OKC again. LT ITB very sore again with STW, but decreased end of session. LTG for LT knee flexion met today 115 degrees    Personal Factors and Comorbidities Comorbidity 1;Comorbidity 2;Other    Examination-Activity Limitations Locomotion Level;Other;Stand;Stairs    Examination-Participation Restrictions Other;Yard Work;Cleaning    Stability/Clinical Decision Making Evolving/Moderate complexity    Rehab Potential Good    PT Frequency 2x / week    PT Duration 6 weeks    PT Treatment/Interventions ADLs/Self Care Home Management;Cryotherapy;Electrical Stimulation;Moist Heat;Gait training;Stair training;Functional mobility training;Therapeutic activities;Therapeutic exercise;Neuromuscular re-education;Manual techniques;Patient/family education;Passive range of motion    PT Next Visit Plan Nustep, VMS to left quadriceps, knee machine (pain-free),  rockerboard, hip exercises (ie:  abduction).  Vasopneumatic as needed.             Patient will benefit from skilled  therapeutic intervention in order to improve the following deficits and impairments:  Abnormal gait, Decreased activity tolerance, Decreased strength, Decreased range of motion, Pain, Increased edema  Visit Diagnosis: Chronic pain of right knee  Chronic pain of left knee  Stiffness of right knee, not elsewhere classified  Stiffness of left knee, not elsewhere classified     Problem List Patient Active Problem List   Diagnosis Date Noted   Chronic idiopathic constipation 06/17/2021   Stage 3b chronic kidney disease ([facility]) 05/06/2021   Perianal irritation 01/20/2021   Essential hypertension 08/06/2019   Vitamin D deficiency 08/06/2019   Genetic testing 01/07/2017   Family history of prostate cancer    Family history of cancer    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 [place], [place], [address] Phone: [phone]   Fax:  [phone]  Name: Susan Haney MRN: [id number] Date of Birth: Sep 24, 1931

## 2021-08-14 ENCOUNTER — Telehealth: Payer: Medicare Other | Admitting: *Deleted

## 2021-08-17 ENCOUNTER — Ambulatory Visit: Payer: Medicare Other | Admitting: *Deleted

## 2021-08-17 ENCOUNTER — Other Ambulatory Visit: Payer: Self-pay

## 2021-08-17 DIAGNOSIS — M25662 Stiffness of left knee, not elsewhere classified: Secondary | ICD-10-CM | POA: Diagnosis not present

## 2021-08-17 DIAGNOSIS — M25562 Pain in left knee: Secondary | ICD-10-CM

## 2021-08-17 DIAGNOSIS — G8929 Other chronic pain: Secondary | ICD-10-CM

## 2021-08-17 DIAGNOSIS — M25661 Stiffness of right knee, not elsewhere classified: Secondary | ICD-10-CM | POA: Diagnosis not present

## 2021-08-17 DIAGNOSIS — M25561 Pain in right knee: Secondary | ICD-10-CM | POA: Diagnosis not present

## 2021-08-17 NOTE — Therapy (Signed)
[facility] [place], [place], [address] Phone: [phone]   Fax:  [phone]  Physical Therapy Treatment  Patient Details  Name: Susan Haney MRN: [id number] Date of Birth: 1931/11/26 Referring Provider (PT): Cherlynn June PA-C   Encounter Date: 08/17/2021   PT End of Session - 08/17/21 1037     Visit Number 10    Number of Visits 12    Date for PT Re-Evaluation 08/04/21    Authorization Type OA, bilateral TKA's with a left knee revision on 02/12/16.  HTN, h/o low back and bilateral hip pain, CTR (right) bunionectomy.    PT Start Time 0945    PT Stop Time 1037    PT Time Calculation (min) 52 min             Past Medical History:  Diagnosis Date   Anemia    history ,  took iron for years   Arthritis    Bicuspid aortic valve    Cancer ([place]) 2017   endometrial cancer / cervical   Cellulitis 12/31/2017   BLE, ankles   Chronic back pain    DDD (degenerative disc disease), lumbar    Endometrial cancer ([place])    GERD (gastroesophageal reflux disease)    History of radiation therapy 06/15/16-07/06/16   vaginal cuff 30 Gy in 5 fractions   Hypertension    Prediabetes    Spondylosis    lumbar    Past Surgical History:  Procedure Laterality Date   BUNIONECTOMY     CARPAL TUNNEL RELEASE Right 05/30/2018   Procedure: RIGHT CARPAL TUNNEL RELEASE;  Surgeon: Daryll Brod, MD;  Location: [place];  Service: Orthopedics;  Laterality: Right;   COLONOSCOPY N/A 04/04/2014   Procedure: COLONOSCOPY;  Surgeon: Rogene Houston, MD;  [facility];  Service: Endoscopy;  Laterality: N/A;  100   EYE SURGERY     bil cataracts and lens implants   FOOT SURGERY     Left   Ovary removed: benign     REPLACEMENT TOTAL KNEE BILATERAL     2013   ROBOTIC ASSISTED TOTAL HYSTERECTOMY WITH BILATERAL SALPINGO OOPHERECTOMY Left 04/13/2016   Procedure: XI ROBOTIC ASSISTED TOTAL HYSTERECTOMY WITH LEFT SALPINGO  OOPHORECTOMY SENTINEL LYMPH NODE BIOPSY;  Surgeon: Everitt Amber, MD;  [facility];  Service: Gynecology;  Laterality: Left;   TOTAL KNEE REVISION Left 02/12/2016   Procedure: LEFT TOTAL KNEE REVISION;  Surgeon: Rod Can, MD;  [facility];  Service: Orthopedics;  Laterality: Left;    There were no vitals filed for this visit.   Subjective Assessment - 08/17/21 0955     Subjective Pt arrives for today's treatment session reporting good relief after last Rx.  Had a sharp pain in LT knee yesterday, but gone today. 4-5/10 pain    Pertinent History OA, bilateral TKA's with a left knee revision on 02/12/16.  HTN, h/o low back and bilateral hip pain, CTR (right) bunionectomy.    How long can you walk comfortably? Walk around home and short community distances with a cane.    Currently in Pain? Yes    Pain Score 5     Pain Location Knee    Pain Orientation Left    Pain Descriptors / Indicators Burning    Pain Type Chronic pain    Pain Onset More than a month ago  [facility] Adult PT Treatment/Exercise - 08/17/21 0001       Knee/Hip Exercises: Aerobic   Nustep Lvl 3, seat 8 x 15 mins      Knee/Hip Exercises: Seated   Long Arc Quad Both;2 sets;Weights    Long Arc Quad Weight --   2# LT 3x10 ,RT 4#     Modalities   Modalities Electrical Stimulation;Vasopneumatic;Moist Heat      Moist Heat Therapy   Number Minutes Moist Heat 15 Minutes    Moist Heat Location Knee      Electrical Stimulation   Electrical Stimulation Location L knee    Electrical Stimulation Action Microcurrent    Electrical Stimulation Parameters 195/40x 15 mins    Electrical Stimulation Goals Pain;Edema      Manual Therapy   Manual Therapy Soft tissue mobilization    Soft tissue mobilization STW/M LT knee lateral aspect and ITB                          PT Long Term Goals - 08/17/21 1035       PT LONG TERM GOAL #1   Title Patient will be  independent with HEP    Time 4    Period Weeks    Status Partially Met      PT LONG TERM GOAL #2   Title Patient will demonstrate 115 degrees or greater of L knee flexion to improve ability to perform functional tasks.    Baseline 115 degrees. 08-13-21    Period Weeks      PT LONG TERM GOAL #3   Baseline 07/27/21: -2 degrees of extension in right knee    Time 4    Period Weeks    Status On-going      PT LONG TERM GOAL #4   Title Patient will reports ability to perform ADLs with less than 4/10 knee pain.    Baseline 07/27/21: Reports 5-6/10 pain with ADLs.    Time 4    Period Weeks    Status On-going                   Plan - 08/17/21 1037     Clinical Impression Statement Pt arrived today reporting rxs are helping with decreased LT knee pain, but short lived. She continues to have Bil lower leg pain that she thinks may be due to circulation problems and will call MD about referral. Today focused on ROM as well as Quad strengthening and pain reduction. 2 visits LT and then DC to HEP?    Personal Factors and Comorbidities Comorbidity 1;Comorbidity 2;Other    Examination-Activity Limitations Locomotion Level;Other;Stand;Stairs    Stability/Clinical Decision Making Evolving/Moderate complexity    Rehab Potential Good    PT Frequency 2x / week    PT Treatment/Interventions ADLs/Self Care Home Management;Cryotherapy;Electrical Stimulation;Moist Heat;Gait training;Stair training;Functional mobility training;Therapeutic activities;Therapeutic exercise;Neuromuscular re-education;Manual techniques;Patient/family education;Passive range of motion    PT Next Visit Plan Nustep, VMS to left quadriceps, knee machine (pain-free), rockerboard, hip exercises (ie:  abduction).  Vasopneumatic as needed.             Patient will benefit from skilled therapeutic intervention in order to improve the following deficits and impairments:  Abnormal gait, Decreased activity tolerance, Decreased  strength, Decreased range of motion, Pain, Increased edema  Visit Diagnosis: Chronic pain of right knee  Chronic pain of left knee  Stiffness of right knee, not elsewhere classified  Stiffness of left knee, not elsewhere  classified     Problem List Patient Active Problem List   Diagnosis Date Noted   Chronic idiopathic constipation 06/17/2021   Stage 3b chronic kidney disease ([place]) 05/06/2021   Perianal irritation 01/20/2021   Essential hypertension 08/06/2019   Vitamin D deficiency 08/06/2019   Genetic testing 01/07/2017   Family history of prostate cancer    Family history of cancer    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 [place], [place], [address] Phone: [phone]   Fax:  [phone]  Name: QUENNA DOEPKE MRN: [id number] Date of Birth: [birth year]  Progress Note Reporting Period 07/07/21 to 08/17/21.  See note below for Objective Data and Assessment of Progress/Goals. Goals ongoing at this time.    Mali Applegate MPT

## 2021-08-20 ENCOUNTER — Ambulatory Visit: Payer: Medicare Other | Attending: Student | Admitting: Physical Therapy

## 2021-08-20 ENCOUNTER — Encounter: Payer: Self-pay | Admitting: Physical Therapy

## 2021-08-20 ENCOUNTER — Other Ambulatory Visit: Payer: Self-pay

## 2021-08-20 DIAGNOSIS — G8929 Other chronic pain: Secondary | ICD-10-CM | POA: Diagnosis not present

## 2021-08-20 DIAGNOSIS — M25662 Stiffness of left knee, not elsewhere classified: Secondary | ICD-10-CM | POA: Insufficient documentation

## 2021-08-20 DIAGNOSIS — M25562 Pain in left knee: Secondary | ICD-10-CM | POA: Insufficient documentation

## 2021-08-20 DIAGNOSIS — M25661 Stiffness of right knee, not elsewhere classified: Secondary | ICD-10-CM | POA: Diagnosis not present

## 2021-08-20 DIAGNOSIS — M25561 Pain in right knee: Secondary | ICD-10-CM | POA: Diagnosis not present

## 2021-08-20 NOTE — Therapy (Signed)
[facility] [place], [place], [address] Phone: [phone]   Fax:  [phone]  Physical Therapy Treatment  Patient Details  Name: Susan Haney MRN: [id number] Date of Birth: July 03, 1932 Referring Provider (PT): Cherlynn June PA-C   Encounter Date: 08/20/2021   PT End of Session - 08/20/21 0948     Visit Number 11    Number of Visits 12    Date for PT Re-Evaluation 08/04/21    Authorization Type OA, bilateral TKA's with a left knee revision on 02/12/16.  HTN, h/o low back and bilateral hip pain, CTR (right) bunionectomy.    PT Start Time 0945    PT Stop Time 1791    PT Time Calculation (min) 44 min    Equipment Utilized During Treatment Other (comment)   [employer]   Activity Tolerance Patient tolerated treatment well    Behavior During Therapy WFL for tasks assessed/performed             Past Medical History:  Diagnosis Date   Anemia    history ,  took iron for years   Arthritis    Bicuspid aortic valve    Cancer ([place]) 2017   endometrial cancer / cervical   Cellulitis 12/31/2017   BLE, ankles   Chronic back pain    DDD (degenerative disc disease), lumbar    Endometrial cancer ([place])    GERD (gastroesophageal reflux disease)    History of radiation therapy 06/15/16-07/06/16   vaginal cuff 30 Gy in 5 fractions   Hypertension    Prediabetes    Spondylosis    lumbar    Past Surgical History:  Procedure Laterality Date   BUNIONECTOMY     CARPAL TUNNEL RELEASE Right 05/30/2018   Procedure: RIGHT CARPAL TUNNEL RELEASE;  Surgeon: Daryll Brod, MD;  Location: [facility];  Service: Orthopedics;  Laterality: Right;   COLONOSCOPY N/A 04/04/2014   Procedure: COLONOSCOPY;  Surgeon: Rogene Houston, MD;  [facility];  Service: Endoscopy;  Laterality: N/A;  100   EYE SURGERY     bil cataracts and lens implants   FOOT SURGERY     Left   Ovary removed: benign     REPLACEMENT TOTAL KNEE  BILATERAL     2013   ROBOTIC ASSISTED TOTAL HYSTERECTOMY WITH BILATERAL SALPINGO OOPHERECTOMY Left 04/13/2016   Procedure: XI ROBOTIC ASSISTED TOTAL HYSTERECTOMY WITH LEFT SALPINGO OOPHORECTOMY SENTINEL LYMPH NODE BIOPSY;  Surgeon: Everitt Amber, MD;  [facility];  Service: Gynecology;  Laterality: Left;   TOTAL KNEE REVISION Left 02/12/2016   Procedure: LEFT TOTAL KNEE REVISION;  Surgeon: Rod Can, MD;  [facility];  Service: Orthopedics;  Laterality: Left;    There were no vitals filed for this visit.   Subjective Assessment - 08/20/21 0946     Subjective Pt arrives for today's treatment session reporting good relief after last Rx.  Had a sharp pain in LT knee yesterday, but gone today. 4-5/10 pain                [facility] PT Assessment - 08/20/21 0001       Assessment   Medical Diagnosis Bilateral total knee replacements.    Referring Provider (PT) Cherlynn June PA-C    Next MD Visit None      Precautions   Precaution Comments No ultrasound.      Restrictions   Weight Bearing Restrictions No  [facility] Adult PT Treatment/Exercise - 08/20/21 0001       Knee/Hip Exercises: Aerobic   Nustep Lvl 3, seat 8 x 15 mins      Knee/Hip Exercises: Standing   Heel Raises Both;2 sets;10 reps    Heel Raises Limitations toe raises 2x10    Other Standing Knee Exercises sidestepping at counter x2 rep      Modalities   Modalities Electrical Stimulation;Vasopneumatic;Moist Heat      Moist Heat Therapy   Number Minutes Moist Heat 15 Minutes    Moist Heat Location Knee      Electrical Stimulation   Electrical Stimulation Location L knee    Electrical Stimulation Action Microcurrent    Electrical Stimulation Parameters 195/40 x15 min    Electrical Stimulation Goals Pain;Edema                     PT Education - 08/20/21 1003     Education Details [id number]    Person(s) Educated Patient    Methods Explanation;Handout     Comprehension Verbalized understanding                 PT Long Term Goals - 08/17/21 1035       PT LONG TERM GOAL #1   Title Patient will be independent with HEP    Time 4    Period Weeks    Status Partially Met      PT LONG TERM GOAL #2   Title Patient will demonstrate 115 degrees or greater of L knee flexion to improve ability to perform functional tasks.    Baseline 115 degrees. 08-13-21    Period Weeks      PT LONG TERM GOAL #3   Baseline 07/27/21: -2 degrees of extension in right knee    Time 4    Period Weeks    Status On-going      PT LONG TERM GOAL #4   Title Patient will reports ability to perform ADLs with less than 4/10 knee pain.    Baseline 07/27/21: Reports 5-6/10 pain with ADLs.    Time 4    Period Weeks    Status On-going                   Plan - 08/20/21 1216     Clinical Impression Statement Patient presented in clinic requesting a new LE HEP that she could make a routine of. Patient was given a comprehensive LE HEP and was instructed through all techniques. Patient reported feeling confident with HEP and instruction. Normal modalities response noted following removal of the modalities.    Personal Factors and Comorbidities Comorbidity 1;Comorbidity 2;Other    Examination-Activity Limitations Locomotion Level;Other;Stand;Stairs    Examination-Participation Restrictions Other;Yard Work;Cleaning    Stability/Clinical Decision Making Evolving/Moderate complexity    Rehab Potential Good    PT Frequency 2x / week    PT Duration 6 weeks    PT Treatment/Interventions ADLs/Self Care Home Management;Cryotherapy;Electrical Stimulation;Moist Heat;Gait training;Stair training;Functional mobility training;Therapeutic activities;Therapeutic exercise;Neuromuscular re-education;Manual techniques;Patient/family education;Passive range of motion    PT Next Visit Plan Nustep, VMS to left quadriceps, knee machine (pain-free), rockerboard, hip exercises (ie:   abduction).  Vasopneumatic as needed.    Consulted and Agree with Plan of Care Patient             Patient will benefit from skilled therapeutic intervention in order to improve the following deficits and impairments:  Abnormal gait, Decreased activity tolerance, Decreased strength, Decreased range of  motion, Pain, Increased edema  Visit Diagnosis: Chronic pain of right knee  Chronic pain of left knee  Stiffness of right knee, not elsewhere classified  Stiffness of left knee, not elsewhere classified     Problem List Patient Active Problem List   Diagnosis Date Noted   Chronic idiopathic constipation 06/17/2021   Stage 3b chronic kidney disease ([place]) 05/06/2021   Perianal irritation 01/20/2021   Essential hypertension 08/06/2019   Vitamin D deficiency 08/06/2019   Genetic testing 01/07/2017   Family history of prostate cancer    Family history of cancer    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 [place], [place], [address] Phone: [phone]   Fax:  [phone]  Name: KATHREN SCEARCE MRN: [id number] Date of Birth: [birth year]

## 2021-08-20 NOTE — Addendum Note (Signed)
Addended by: Ziara Thelander, [place] W on: 08/20/2021 06:03 PM   Modules accepted: Orders

## 2021-08-25 ENCOUNTER — Other Ambulatory Visit: Payer: Self-pay

## 2021-08-25 ENCOUNTER — Ambulatory Visit: Payer: Medicare Other | Admitting: *Deleted

## 2021-08-25 DIAGNOSIS — M25661 Stiffness of right knee, not elsewhere classified: Secondary | ICD-10-CM | POA: Diagnosis not present

## 2021-08-25 DIAGNOSIS — M25562 Pain in left knee: Secondary | ICD-10-CM | POA: Diagnosis not present

## 2021-08-25 DIAGNOSIS — M25561 Pain in right knee: Secondary | ICD-10-CM | POA: Diagnosis not present

## 2021-08-25 DIAGNOSIS — G8929 Other chronic pain: Secondary | ICD-10-CM | POA: Diagnosis not present

## 2021-08-25 DIAGNOSIS — M25662 Stiffness of left knee, not elsewhere classified: Secondary | ICD-10-CM

## 2021-08-25 NOTE — Therapy (Signed)
[facility] [place], [place], [address] Phone: [phone]   Fax:  [phone]  Physical Therapy Treatment  Patient Details  Name: Susan Haney MRN: [id number] Date of Birth: 11-01-1931 Referring Provider (PT): Cherlynn June PA-C   Encounter Date: 08/25/2021   PT End of Session - 08/25/21 0957     Visit Number 12    Number of Visits 12    Date for PT Re-Evaluation 08/27/21    Authorization Type OA, bilateral TKA's with a left knee revision on 02/12/16.  HTN, h/o low back and bilateral hip pain, CTR (right) bunionectomy.    Authorization Time Period FOTO performed 12th visit and DC    PT Start Time 0945    PT Stop Time 1035    PT Time Calculation (min) 50 min             Past Medical History:  Diagnosis Date   Anemia    history ,  took iron for years   Arthritis    Bicuspid aortic valve    Cancer ([place]) 2017   endometrial cancer / cervical   Cellulitis 12/31/2017   BLE, ankles   Chronic back pain    DDD (degenerative disc disease), lumbar    Endometrial cancer ([place])    GERD (gastroesophageal reflux disease)    History of radiation therapy 06/15/16-07/06/16   vaginal cuff 30 Gy in 5 fractions   Hypertension    Prediabetes    Spondylosis    lumbar    Past Surgical History:  Procedure Laterality Date   BUNIONECTOMY     CARPAL TUNNEL RELEASE Right 05/30/2018   Procedure: RIGHT CARPAL TUNNEL RELEASE;  Surgeon: Daryll Brod, MD;  Location: [facility];  Service: Orthopedics;  Laterality: Right;   COLONOSCOPY N/A 04/04/2014   Procedure: COLONOSCOPY;  Surgeon: Rogene Houston, MD;  [facility];  Service: Endoscopy;  Laterality: N/A;  100   EYE SURGERY     bil cataracts and lens implants   FOOT SURGERY     Left   Ovary removed: benign     REPLACEMENT TOTAL KNEE BILATERAL     2013   ROBOTIC ASSISTED TOTAL HYSTERECTOMY WITH BILATERAL SALPINGO OOPHERECTOMY Left 04/13/2016    Procedure: XI ROBOTIC ASSISTED TOTAL HYSTERECTOMY WITH LEFT SALPINGO OOPHORECTOMY SENTINEL LYMPH NODE BIOPSY;  Surgeon: Everitt Amber, MD;  [facility];  Service: Gynecology;  Laterality: Left;   TOTAL KNEE REVISION Left 02/12/2016   Procedure: LEFT TOTAL KNEE REVISION;  Surgeon: Rod Can, MD;  [facility];  Service: Orthopedics;  Laterality: Left;    There were no vitals filed for this visit.   Subjective Assessment - 08/25/21 0955     Subjective Patient reports that the stim has helped. Doing better, but Both LE's continue to burn from the knees down and want to get the circulation checked    Pertinent History OA, bilateral TKA's with a left knee revision on 02/12/16.  HTN, h/o low back and bilateral hip pain, CTR (right) bunionectomy.    How long can you stand comfortably? Varies.    How long can you walk comfortably? Walk around home and short community distances with a cane.    Patient Stated Goals Decrease pain and be able to do more.    Pain Score 3     Pain Location Knee    Pain Orientation Left    Pain Descriptors / Indicators Burning    Pain Type Chronic pain  [facility] Adult PT Treatment/Exercise - 08/25/21 0001       Knee/Hip Exercises: Aerobic   Nustep Lvl 3, seat 8 x 15 mins      Knee/Hip Exercises: Standing   Other Standing Knee Exercises Reviewed HEP      Modalities   Modalities Electrical Stimulation;Vasopneumatic;Moist Heat      Moist Heat Therapy   Number Minutes Moist Heat 15 Minutes    Moist Heat Location Knee      Electrical Stimulation   Electrical Stimulation Location L knee    Electrical Stimulation Action Microcurrent    Electrical Stimulation Parameters 195/40 Freq. and 100 microamps x7mns    Electrical Stimulation Goals Pain;Edema      Manual Therapy   Manual Therapy Soft tissue mobilization    Soft tissue mobilization STW/M LT knee lateral aspect and ITB                           PT Long Term Goals - 08/25/21 1805       PT LONG TERM GOAL #1   Title Patient will be independent with HEP    Time 4    Period Weeks    Status Achieved      PT LONG TERM GOAL #2   Title Patient will demonstrate 115 degrees or greater of L knee flexion to improve ability to perform functional tasks.    Baseline 115 degrees. 08-13-21    Time 4    Period Weeks    Status Achieved      PT LONG TERM GOAL #3   Title Full active right knee extension.    Time 4    Period Weeks    Status Achieved      PT LONG TERM GOAL #4   Title Patient will reports ability to perform ADLs with less than 4/10 knee pain.    Period Weeks    Status Not Met   still 5-6/10 at times                  Plan - 08/25/21 1804     Clinical Impression Statement Pt arrived today reporting doing beetter since starting PT with decreased knee pain and increased ROM. Her cc today was burning in both lower legs from the knees down and wants to get her circulation checked. She met majority of goals, but not pain less than 4/10 with ADL's due to higher pain levels at times    Personal Factors and Comorbidities Comorbidity 1;Comorbidity 2;Other    Examination-Activity Limitations Locomotion Level;Other;Stand;Stairs    Stability/Clinical Decision Making Evolving/Moderate complexity    Rehab Potential Good    PT Frequency 2x / week    PT Duration 6 weeks    PT Treatment/Interventions ADLs/Self Care Home Management;Cryotherapy;Electrical Stimulation;Moist Heat;Gait training;Stair training;Functional mobility training;Therapeutic activities;Therapeutic exercise;Neuromuscular re-education;Manual techniques;Patient/family education;Passive range of motion    PT Next Visit Plan DC to HEP    Consulted and Agree with Plan of Care Patient             Patient will benefit from skilled therapeutic intervention in order to improve the following deficits and impairments:  Abnormal gait,  Decreased activity tolerance, Decreased strength, Decreased range of motion, Pain, Increased edema  Visit Diagnosis: Chronic pain of right knee  Chronic pain of left knee  Stiffness of right knee, not elsewhere classified  Stiffness of left knee, not elsewhere classified     Problem List Patient Active Problem List  Diagnosis Date Noted   Chronic idiopathic constipation 06/17/2021   Stage 3b chronic kidney disease ([place]) 05/06/2021   Perianal irritation 01/20/2021   Essential hypertension 08/06/2019   Vitamin D deficiency 08/06/2019   Genetic testing 01/07/2017   Family history of prostate cancer    Family history of cancer    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 [place], [place], [address] Phone: [phone]   Fax:  [phone]  Name: Susan Haney MRN: [id number] Date of Birth: Feb 06, 1932   PHYSICAL THERAPY DISCHARGE SUMMARY  Visits from Start of Care: 12.  Current functional level related to goals / functional outcomes: See above.   Remaining deficits: All but LTG #4 met.   Education / Equipment: HEP.   Patient agrees to discharge. Patient goals were partially met. Patient is being discharged due to being pleased with the current functional level.    Mali Applegate MPT

## 2021-09-08 ENCOUNTER — Ambulatory Visit (INDEPENDENT_AMBULATORY_CARE_PROVIDER_SITE_OTHER): Payer: Medicare Other | Admitting: Family Medicine

## 2021-09-08 ENCOUNTER — Encounter: Payer: Self-pay | Admitting: Family Medicine

## 2021-09-08 VITALS — BP 125/69 | HR 70 | Temp 98.1°F | Ht 63.0 in | Wt 176.8 lb

## 2021-09-08 DIAGNOSIS — R81 Glycosuria: Secondary | ICD-10-CM

## 2021-09-08 DIAGNOSIS — L309 Dermatitis, unspecified: Secondary | ICD-10-CM | POA: Diagnosis not present

## 2021-09-08 DIAGNOSIS — M5030 Other cervical disc degeneration, unspecified cervical region: Secondary | ICD-10-CM

## 2021-09-08 DIAGNOSIS — J302 Other seasonal allergic rhinitis: Secondary | ICD-10-CM | POA: Diagnosis not present

## 2021-09-08 DIAGNOSIS — R739 Hyperglycemia, unspecified: Secondary | ICD-10-CM | POA: Diagnosis not present

## 2021-09-08 DIAGNOSIS — K5904 Chronic idiopathic constipation: Secondary | ICD-10-CM

## 2021-09-08 DIAGNOSIS — R208 Other disturbances of skin sensation: Secondary | ICD-10-CM | POA: Diagnosis not present

## 2021-09-08 LAB — URINALYSIS, ROUTINE W REFLEX MICROSCOPIC
Bilirubin, UA: NEGATIVE
Ketones, UA: NEGATIVE
Nitrite, UA: NEGATIVE
Protein,UA: NEGATIVE
RBC, UA: NEGATIVE
Specific Gravity, UA: 1.015 (ref 1.005–1.030)
Urobilinogen, Ur: 0.2 mg/dL (ref 0.2–1.0)
pH, UA: 7 (ref 5.0–7.5)

## 2021-09-08 LAB — BAYER DCA HB A1C WAIVED: HB A1C (BAYER DCA - WAIVED): 5.5 % (ref 4.8–5.6)

## 2021-09-08 LAB — MICROSCOPIC EXAMINATION
Epithelial Cells (non renal): >10 /hpf — AB (ref 0–10)
RBC, Urine: NONE SEEN /hpf (ref 0–2)
Renal Epithel, UA: NONE SEEN /hpf

## 2021-09-08 MED ORDER — TRIAMCINOLONE ACETONIDE 0.5 % EX CREA: 1.0000 "application " | TOPICAL_CREAM | Freq: Three times a day (TID) | CUTANEOUS | 2 refills | Status: DC

## 2021-09-08 MED ORDER — PREGABALIN 75 MG PO CAPS: 75.0000 mg | ORAL_CAPSULE | Freq: Every day | ORAL | 2 refills | Status: DC

## 2021-09-08 NOTE — Progress Notes (Signed)
Assessment & Plan:  1. Glucose found in urine on examination - Bayer DCA Hb A1c Waived - Urinalysis, Routine w reflex microscopic - Microscopic Examination  2. Seasonal allergies Well controlled on current regimen.   3. Chronic idiopathic constipation Patient is going to resume Colace.   4. Chronic eczema Well controlled on current regimen.  - triamcinolone cream (KENALOG) 0.5 %; Apply 1 application topically 3 (three) times daily.  Dispense: 45 g; Refill: 2  5-6. Burning sensation of lower extremity/Degenerative disc disease, cervical Trial of Lyrica, she has not done well with Gabapentin in the past. - pregabalin (LYRICA) 75 MG capsule; Take 1 capsule (75 mg total) by mouth at bedtime.  Dispense: 30 capsule; Refill: 2   Follow up plan: Return as scheduled.  Hendricks Limes, MSN, APRN, FNP-C [facility]  Subjective:   Patient ID: Susan Haney, female    DOB: 1931-08-04, 88 y.o.   MRN: [id number]  HPI: Susan Haney is a 88 y.o. female presenting on 09/08/2021 for review urine results  (07/30/21) and Poor Circulation (PT told pt he thought she may have bilateral lower leg circulation problems. )  Patient is concerned about her urinalysis that was collected on 07/30/2021 when she was in the hospital as it showed >= 500 glucose and moderate hemoglobin.  This was ordered by her oncologist due to a report of dysuria.  She reports she does not feel like Singulair is helping her allergies, so she is going to stop it until they get worse in the springtime.  She also wants it noted she is going to resume Colace to help her constipation in addition to Amitiza as it is not quite enough.  She is requesting a refill of triamcinolone cream that she applies to itchy spots on her body.  She currently has one on her right arm and right side of her abdomen.  She has chronic back and knee pain, but states she keeps having a burning sensation in her legs.  In addition  she has pain in the back of her head when she lays on anything.  The pain comes from behind her ears and goes upwards.  She had a head CT in April 2022 which did not show any acute abnormality.Cervical spine x-ray in October 2022 showed multilevel degenerative disc disease and facet hypertrophy with slight progression from her previous exam.  She has bony neural foraminal narrowing at C3-C4 on the left side.  She was offered a referral to a specialist, but declined as she has no intentions of having any further surgeries and has already had injections in her back which she did not find helpful.     ROS: Negative unless specifically indicated above in HPI.   Relevant past medical history reviewed and updated as indicated.   Allergies and medications reviewed and updated.   Current Outpatient Medications:    aspirin EC 81 MG tablet, Take 81 mg by mouth daily., Disp: , Rfl:    Biotin 1000 MCG tablet, Take 1,000 mcg by mouth daily., Disp: , Rfl:    cetirizine (ZYRTEC) 10 MG tablet, Take 1 tablet (10 mg total) by mouth daily., Disp: 90 tablet, Rfl: 1   cholecalciferol (VITAMIN D) 400 UNITS TABS tablet, Take 2,000 Units by mouth., Disp: , Rfl:    Cyanocobalamin (B-12 PO), Take by mouth., Disp: , Rfl:    dapagliflozin propanediol (FARXIGA) 10 MG TABS tablet, Take 1 tablet (10 mg total) by mouth daily., Disp: 90 tablet, Rfl:  1   fluticasone (FLONASE) 50 MCG/ACT nasal spray, SPRAY 2 SPRAYS INTO EACH NOSTRIL EVERY DAY, Disp: 48 mL, Rfl: 2   glucose blood (ACCU-CHEK AVIVA PLUS) test strip, Use to check Blood Sugars 1-2 times daily. Dx E11.9, Disp: 100 each, Rfl: 12   lidocaine (LIDODERM) 5 %, lidocaine 5 % topical patch  PLACE 1 PATCH ONTO THE SKIN DAILY. REMOVE & DISCARD PATCH WITHIN 12 HOURS OR AS DIRECTED BY MD, Disp: , Rfl:    lisinopril-hydrochlorothiazide (ZESTORETIC) 20-25 MG tablet, Take 1 tablet by mouth daily., Disp: 90 tablet, Rfl: 1   lubiprostone (AMITIZA) 8 MCG capsule, Take 1 capsule (8 mcg  total) by mouth 2 (two) times daily with a meal., Disp: 180 capsule, Rfl: 3   Magnesium Oxide (MAG-200 PO), Take by mouth., Disp: , Rfl:    Misc Natural Products (TART CHERRY ADVANCED) CAPS, Take by mouth daily. , Disp: , Rfl:    Multiple Vitamins-Minerals (MULTIVITAMIN WITH MINERALS) tablet, Take 1 tablet by mouth daily., Disp: , Rfl:    nystatin-triamcinolone (MYCOLOG II) cream, Apply 1 application topically 2 (two) times daily., Disp: 30 g, Rfl: 1   Omega-3 Fatty Acids (MAXEPA PO), Take 500 mg by mouth daily at 6 (six) AM., Disp: , Rfl:    omeprazole (PRILOSEC) 20 MG capsule, Take 1 capsule (20 mg total) by mouth daily. (Patient taking differently: Take 20 mg by mouth 3 (three) times a week.), Disp: 90 capsule, Rfl: 1   psyllium (METAMUCIL) 58.6 % packet, Take 1 packet by mouth daily., Disp: , Rfl:    triamcinolone cream (KENALOG) 0.5 %, SMARTSIG:1 Sparingly Topical 3 Times Daily, Disp: , Rfl:    montelukast (SINGULAIR) 10 MG tablet, Take 1 tablet (10 mg total) by mouth at bedtime. (Patient not taking: Reported on 09/08/2021), Disp: 90 tablet, Rfl: 1  No Known Allergies  Objective:   BP 125/69    Pulse 70    Temp 98.1 F (36.7 C) (Temporal)    Ht 5\' 3"  (1.6 m)    Wt 176 lb 12.8 oz (80.2 kg)    SpO2 96%    BMI 31.32 kg/m    Physical Exam Vitals reviewed.  Constitutional:      General: She is not in acute distress.    Appearance: Normal appearance. She is not ill-appearing, toxic-appearing or diaphoretic.  HENT:     Head: Normocephalic and atraumatic.  Eyes:     General: No scleral icterus.       Right eye: No discharge.        Left eye: No discharge.     Conjunctiva/sclera: Conjunctivae normal.  Cardiovascular:     Rate and Rhythm: Normal rate and regular rhythm.     Heart sounds: Normal heart sounds. No murmur heard.   No friction rub. No gallop.  Pulmonary:     Effort: Pulmonary effort is normal. No respiratory distress.     Breath sounds: Normal breath sounds. No stridor. No  wheezing, rhonchi or rales.  Musculoskeletal:        General: Normal range of motion.     Cervical back: Normal range of motion.  Skin:    General: Skin is warm and dry.     Capillary Refill: Capillary refill takes less than 2 seconds.  Neurological:     General: No focal deficit present.     Mental Status: She is alert and oriented to person, place, and time. Mental status is at baseline.  Psychiatric:  Mood and Affect: Mood normal.        Behavior: Behavior normal.        Thought Content: Thought content normal.        Judgment: Judgment normal.

## 2021-09-14 ENCOUNTER — Encounter: Payer: Self-pay | Admitting: Family Medicine

## 2021-09-14 DIAGNOSIS — J302 Other seasonal allergic rhinitis: Secondary | ICD-10-CM | POA: Insufficient documentation

## 2021-09-14 DIAGNOSIS — R208 Other disturbances of skin sensation: Secondary | ICD-10-CM | POA: Insufficient documentation

## 2021-09-14 DIAGNOSIS — M5030 Other cervical disc degeneration, unspecified cervical region: Secondary | ICD-10-CM | POA: Insufficient documentation

## 2021-09-14 DIAGNOSIS — L309 Dermatitis, unspecified: Secondary | ICD-10-CM | POA: Insufficient documentation

## 2021-09-22 DIAGNOSIS — B351 Tinea unguium: Secondary | ICD-10-CM | POA: Diagnosis not present

## 2021-09-22 DIAGNOSIS — M79676 Pain in unspecified toe(s): Secondary | ICD-10-CM | POA: Diagnosis not present

## 2021-09-22 DIAGNOSIS — L84 Corns and callosities: Secondary | ICD-10-CM | POA: Diagnosis not present

## 2021-09-22 DIAGNOSIS — E1142 Type 2 diabetes mellitus with diabetic polyneuropathy: Secondary | ICD-10-CM | POA: Diagnosis not present

## 2021-09-30 ENCOUNTER — Other Ambulatory Visit: Payer: Self-pay | Admitting: Family Medicine

## 2021-09-30 DIAGNOSIS — I10 Essential (primary) hypertension: Secondary | ICD-10-CM

## 2021-10-07 ENCOUNTER — Other Ambulatory Visit: Payer: Self-pay | Admitting: Family Medicine

## 2021-10-07 DIAGNOSIS — K219 Gastro-esophageal reflux disease without esophagitis: Secondary | ICD-10-CM

## 2021-11-03 ENCOUNTER — Ambulatory Visit (INDEPENDENT_AMBULATORY_CARE_PROVIDER_SITE_OTHER): Payer: Medicare Other | Admitting: Family Medicine

## 2021-11-03 ENCOUNTER — Encounter: Payer: Self-pay | Admitting: Family Medicine

## 2021-11-03 ENCOUNTER — Encounter: Payer: Medicare Other | Admitting: Family Medicine

## 2021-11-03 VITALS — BP 136/72 | HR 73 | Temp 98.0°F | Ht 63.0 in | Wt 175.4 lb

## 2021-11-03 DIAGNOSIS — Z23 Encounter for immunization: Secondary | ICD-10-CM | POA: Diagnosis not present

## 2021-11-03 DIAGNOSIS — K219 Gastro-esophageal reflux disease without esophagitis: Secondary | ICD-10-CM

## 2021-11-03 DIAGNOSIS — K5904 Chronic idiopathic constipation: Secondary | ICD-10-CM

## 2021-11-03 DIAGNOSIS — R208 Other disturbances of skin sensation: Secondary | ICD-10-CM

## 2021-11-03 DIAGNOSIS — Z0001 Encounter for general adult medical examination with abnormal findings: Secondary | ICD-10-CM

## 2021-11-03 DIAGNOSIS — N1832 Chronic kidney disease, stage 3b: Secondary | ICD-10-CM | POA: Diagnosis not present

## 2021-11-03 DIAGNOSIS — R519 Headache, unspecified: Secondary | ICD-10-CM

## 2021-11-03 DIAGNOSIS — J302 Other seasonal allergic rhinitis: Secondary | ICD-10-CM

## 2021-11-03 DIAGNOSIS — I10 Essential (primary) hypertension: Secondary | ICD-10-CM | POA: Diagnosis not present

## 2021-11-03 DIAGNOSIS — Z0000 Encounter for general adult medical examination without abnormal findings: Secondary | ICD-10-CM

## 2021-11-03 DIAGNOSIS — H60543 Acute eczematoid otitis externa, bilateral: Secondary | ICD-10-CM | POA: Diagnosis not present

## 2021-11-03 DIAGNOSIS — M5030 Other cervical disc degeneration, unspecified cervical region: Secondary | ICD-10-CM | POA: Diagnosis not present

## 2021-11-03 LAB — CMP14+EGFR
ALT: 7 [IU]/L (ref 0–32)
AST: 16 [IU]/L (ref 0–40)
Albumin/Globulin Ratio: 1.8 (ref 1.2–2.2)
Albumin: 4.4 g/dL (ref 3.6–4.6)
Alkaline Phosphatase: 60 [IU]/L (ref 44–121)
BUN/Creatinine Ratio: 15 (ref 12–28)
BUN: 19 mg/dL (ref 8–27)
Bilirubin Total: 0.4 mg/dL (ref 0.0–1.2)
CO2: 25 mmol/L (ref 20–29)
Calcium: 9.9 mg/dL (ref 8.7–10.3)
Chloride: 102 mmol/L (ref 96–106)
Creatinine, Ser: 1.24 mg/dL — ABNORMAL HIGH (ref 0.57–1.00)
Globulin, Total: 2.5 g/dL (ref 1.5–4.5)
Glucose: 111 mg/dL — ABNORMAL HIGH (ref 70–99)
Potassium: 4.8 mmol/L (ref 3.5–5.2)
Sodium: 143 mmol/L (ref 134–144)
Total Protein: 6.9 g/dL (ref 6.0–8.5)
eGFR: 42 mL/min/{1.73_m2} — ABNORMAL LOW

## 2021-11-03 MED ORDER — CIPROFLOXACIN-DEXAMETHASONE 0.3-0.1 % OT SUSP: 4.0000 [drp] | Freq: Two times a day (BID) | OTIC | 0 refills | Status: AC

## 2021-11-03 MED ORDER — DAPAGLIFLOZIN PROPANEDIOL 10 MG PO TABS: 10.0000 mg | ORAL_TABLET | Freq: Every day | ORAL | 1 refills | Status: DC

## 2021-11-03 MED ORDER — AMITRIPTYLINE HCL 10 MG PO TABS: 10.0000 mg | ORAL_TABLET | Freq: Every day | ORAL | 2 refills | Status: DC

## 2021-11-03 MED ORDER — CETIRIZINE HCL 10 MG PO TABS: 10.0000 mg | ORAL_TABLET | Freq: Every day | ORAL | 1 refills | Status: AC

## 2021-11-03 NOTE — Progress Notes (Signed)
? ?Assessment & Plan:  ?1. Well adult exam ?Preventive health education provided. ?- CMP14+EGFR ? ?2. Essential hypertension ?Well controlled on current regimen.  ?- CMP14+EGFR ? ?3. Chronic idiopathic constipation ?Well controlled on current regimen.  ?- CMP14+EGFR ? ?4. Gastroesophageal reflux disease, unspecified whether esophagitis present ?Well controlled on current regimen.  ?- CMP14+EGFR ? ?5. Seasonal allergies ?Well controlled on current regimen.  ?- cetirizine (ZYRTEC) 10 MG tablet; Take 1 tablet (10 mg total) by mouth daily.  Dispense: 90 tablet; Refill: 1 ? ?6. Stage 3b chronic kidney disease ([place]) ?Continue Farxiga. ?- dapagliflozin propanediol (FARXIGA) 10 MG TABS tablet; Take 1 tablet (10 mg total) by mouth daily.  Dispense: 90 tablet; Refill: 1 ?- CMP14+EGFR ? ?7. Degenerative disc disease, cervical ?Tylenol for pain. ? ?8. Burning sensation of lower extremity ?Starting amitriptyline at low-dose.  Referring to neurology. ?- amitriptyline (ELAVIL) 10 MG tablet; Take 1 tablet (10 mg total) by mouth at bedtime.  Dispense: 30 tablet; Refill: 2 ?- Ambulatory referral to Neurology ? ?9. Daily headache ?Starting amitriptyline at low-dose.  Referring to neurology. ?- amitriptyline (ELAVIL) 10 MG tablet; Take 1 tablet (10 mg total) by mouth at bedtime.  Dispense: 30 tablet; Refill: 2 ?- Ambulatory referral to Neurology ? ?10. Eczema of both external ears ?- ciprofloxacin-dexamethasone (CIPRODEX) OTIC suspension; Place 4 drops into both ears 2 (two) times daily for 7 days.  Dispense: 7.5 mL; Refill: 0 ? ?11. Immunization due ?- Varicella-zoster vaccine IM (Shingrix) ? ? ?Follow-up: Return in about 3 months (around 02/02/2022) for follow-up of chronic medication conditions.  ? ?Hendricks Limes, MSN, APRN, FNP-C ?[place] ? ?Subjective:  ?Patient ID: Susan Haney, female    DOB: [birth year]  Age: 88 y.o. MRN: [id number] ? ?Patient Care Team: ?Loman Brooklyn, FNP as PCP - General (Family  Medicine) ?Celestia Khat, OD (Optometry) ?Lavera Guise, [place] as [facility] (Pharmacist) ?Ilean China, RN as Case Manager  ? ?CC:  ?Chief Complaint  ?Patient presents with  ? Annual Exam  ? ? ?HPI ?Wynelle Bourgeois Simao presents for her annual physical. ? ?Occupation: retired, Marital status: widowed, Substance use: none ?Diet: diabetic, Exercise: daily ?Last eye exam: May 2022 with upcoming appointment next month ?Last dental exam: a while back; will schedule ?DEXA: 05/16/2020 with osteopenia.  ?Immunizations: Flu Vaccine:  not influenza season ?Tdap Vaccine: up to date  ?Shingrix Vaccine:  agreeable to receive today   ?COVID-19 Vaccine: up to date ?Pneumonia Vaccine: up to date ? ?Advanced Directives ?Patient does have advanced directives including living will and healthcare power of attorney. She does have a copy in the electronic medical record.  ? ?DEPRESSION SCREENING ? ?  11/03/2021  ?  9:23 AM 09/08/2021  ? 10:29 AM 06/02/2021  ? 10:58 AM 05/28/2021  ?  3:38 PM 05/06/2021  ? 11:11 AM 11/26/2020  ? 10:46 AM 05/16/2020  ?  8:19 AM  ?PHQ 2/9 Scores  ?PHQ - 2 Score 0 0 0 0 0 1 0  ?PHQ- 9 Score '2 4 1 2 3 2   ' ?  ?Burning sensation of lower extremity: tried Lyrica at our last visit and states it did not help the pain and she stayed tired all the time. Previously failed treatment with gabapentin. ? ?Patient has ongoing issues with daily headaches and neck pain.  This has been going on for a year now.  The headache is dull at the back of her head.  She is only able  to sleep on her back with her head turned a certain way, or else her neck starts hurting.  She had a head CT completed in April 2022 which did not reveal any acute abnormality.  She had a cervical spine x-ray completed in October 2022 which showed multilevel degenerative disc disease.  She was offered a referral to a specialist, but declined as she was not interested in surgery and had tried injections in the past which were not  effective. ? ?Review of Systems  ?Constitutional:  Negative for chills, fever, malaise/fatigue and weight loss.  ?HENT:  Negative for congestion, ear discharge, ear pain, nosebleeds, sinus pain, sore throat and tinnitus.   ?     Itchy ears.  ?Eyes:  Negative for blurred vision, double vision, pain, discharge and redness.  ?Respiratory:  Negative for cough, shortness of breath and wheezing.   ?Cardiovascular:  Negative for chest pain, palpitations and leg swelling.  ?Gastrointestinal:  Negative for abdominal pain, constipation, diarrhea, heartburn, nausea and vomiting.  ?Genitourinary:  Negative for dysuria, frequency and urgency.  ?Musculoskeletal:  Positive for joint pain and neck pain. Negative for myalgias.  ?Skin:  Negative for rash.  ?Neurological:  Positive for headaches. Negative for dizziness, seizures and weakness.  ?Psychiatric/Behavioral:  Negative for depression, substance abuse and suicidal ideas. The patient is not nervous/anxious.   ? ? ?Current Outpatient Medications:  ?  aspirin EC 81 MG tablet, Take 81 mg by mouth daily., Disp: , Rfl:  ?  Biotin 1000 MCG tablet, Take 1,000 mcg by mouth daily., Disp: , Rfl:  ?  cetirizine (ZYRTEC) 10 MG tablet, Take 1 tablet (10 mg total) by mouth daily., Disp: 90 tablet, Rfl: 1 ?  cholecalciferol (VITAMIN D) 400 UNITS TABS tablet, Take 2,000 Units by mouth., Disp: , Rfl:  ?  Cyanocobalamin (B-12 PO), Take by mouth., Disp: , Rfl:  ?  dapagliflozin propanediol (FARXIGA) 10 MG TABS tablet, Take 1 tablet (10 mg total) by mouth daily., Disp: 90 tablet, Rfl: 1 ?  fluticasone (FLONASE) 50 MCG/ACT nasal spray, SPRAY 2 SPRAYS INTO EACH NOSTRIL EVERY DAY, Disp: 48 mL, Rfl: 2 ?  glucose blood (ACCU-CHEK AVIVA PLUS) test strip, Use to check Blood Sugars 1-2 times daily. Dx E11.9, Disp: 100 each, Rfl: 12 ?  lidocaine (LIDODERM) 5 %, lidocaine 5 % topical patch  PLACE 1 PATCH ONTO THE SKIN DAILY. REMOVE & DISCARD PATCH WITHIN 12 HOURS OR AS DIRECTED BY MD, Disp: , Rfl:  ?   lisinopril-hydrochlorothiazide (ZESTORETIC) 20-25 MG tablet, TAKE 1 TABLET BY MOUTH  DAILY, Disp: 90 tablet, Rfl: 0 ?  lubiprostone (AMITIZA) 8 MCG capsule, Take 1 capsule (8 mcg total) by mouth 2 (two) times daily with a meal., Disp: 180 capsule, Rfl: 3 ?  Magnesium Oxide (MAG-200 PO), Take by mouth., Disp: , Rfl:  ?  Misc Natural Products (TART CHERRY ADVANCED) CAPS, Take by mouth daily. , Disp: , Rfl:  ?  Multiple Vitamins-Minerals (MULTIVITAMIN WITH MINERALS) tablet, Take 1 tablet by mouth daily., Disp: , Rfl:  ?  nystatin-triamcinolone (MYCOLOG II) cream, Apply 1 application topically 2 (two) times daily., Disp: 30 g, Rfl: 1 ?  Omega-3 Fatty Acids (MAXEPA PO), Take 500 mg by mouth daily at 6 (six) AM., Disp: , Rfl:  ?  psyllium (METAMUCIL) 58.6 % packet, Take 1 packet by mouth daily., Disp: , Rfl:  ?  triamcinolone cream (KENALOG) 0.5 %, Apply 1 application topically 3 (three) times daily., Disp: 45 g, Rfl: 2 ?  pregabalin (LYRICA)  75 MG capsule, Take 1 capsule (75 mg total) by mouth at bedtime. (Patient not taking: Reported on 11/03/2021), Disp: 30 capsule, Rfl: 2 ? ?No Known Allergies ? ?Past Medical History:  ?Diagnosis Date  ? Anemia   ? history ,  took iron for years  ? Arthritis   ? Bicuspid aortic valve   ? Cancer [facility]) 2017  ? endometrial cancer / cervical  ? Cellulitis 12/31/2017  ? BLE, ankles  ? Chronic back pain   ? DDD (degenerative disc disease), lumbar   ? Endometrial cancer ([place])   ? GERD (gastroesophageal reflux disease)   ? History of radiation therapy 06/15/16-07/06/16  ? vaginal cuff 30 Gy in 5 fractions  ? Hypertension   ? Prediabetes   ? Spondylosis   ? lumbar  ? ? ?Past Surgical History:  ?Procedure Laterality Date  ? BUNIONECTOMY    ? CARPAL TUNNEL RELEASE Right 05/30/2018  ? Procedure: RIGHT CARPAL TUNNEL RELEASE;  Surgeon: Daryll Brod, MD;  Location: [facility];  Service: Orthopedics;  Laterality: Right;  ? COLONOSCOPY N/A 04/04/2014  ? Procedure: COLONOSCOPY;  Surgeon:  Rogene Houston, MD;  [facility];  Service: Endoscopy;  Laterality: N/A;  100  ? EYE SURGERY    ? bil cataracts and lens implants  ? FOOT SURGERY    ? Left  ? Ovary removed: benign    ? REPLACE

## 2021-11-18 ENCOUNTER — Other Ambulatory Visit: Payer: Self-pay | Admitting: Family Medicine

## 2021-11-18 DIAGNOSIS — I10 Essential (primary) hypertension: Secondary | ICD-10-CM

## 2021-11-19 ENCOUNTER — Telehealth: Payer: Self-pay | Admitting: *Deleted

## 2021-11-19 MED ORDER — MONTELUKAST SODIUM 10 MG PO TABS: 10.0000 mg | ORAL_TABLET | Freq: Every day | ORAL | 1 refills | Status: DC

## 2021-11-19 NOTE — Telephone Encounter (Signed)
Refill sent.  She and I have discussed in the past that she would resume this when her allergies got worse in the spring. ?

## 2021-11-19 NOTE — Telephone Encounter (Signed)
Fax from [facility] ?RF request for Montelukast ?Not on current med list, taken off during 09/08/21 visit Next OV 02/10/22 ?Please advise on refill ?

## 2021-11-19 NOTE — Telephone Encounter (Signed)
Patient aware.

## 2021-11-19 NOTE — Addendum Note (Signed)
Addended by: Hendricks Limes F on: 11/19/2021 11:40 AM ? ? Modules accepted: Orders ? ?

## 2021-11-25 ENCOUNTER — Other Ambulatory Visit (INDEPENDENT_AMBULATORY_CARE_PROVIDER_SITE_OTHER): Payer: Self-pay | Admitting: Internal Medicine

## 2021-11-25 ENCOUNTER — Other Ambulatory Visit: Payer: Self-pay | Admitting: Family Medicine

## 2021-11-25 DIAGNOSIS — R519 Headache, unspecified: Secondary | ICD-10-CM

## 2021-11-25 DIAGNOSIS — R208 Other disturbances of skin sensation: Secondary | ICD-10-CM

## 2021-12-08 DIAGNOSIS — B351 Tinea unguium: Secondary | ICD-10-CM | POA: Diagnosis not present

## 2021-12-08 DIAGNOSIS — E1142 Type 2 diabetes mellitus with diabetic polyneuropathy: Secondary | ICD-10-CM | POA: Diagnosis not present

## 2021-12-08 DIAGNOSIS — L84 Corns and callosities: Secondary | ICD-10-CM | POA: Diagnosis not present

## 2021-12-08 DIAGNOSIS — M79676 Pain in unspecified toe(s): Secondary | ICD-10-CM | POA: Diagnosis not present

## 2021-12-09 NOTE — Progress Notes (Unsigned)
Referring:  Loman Brooklyn, [place] [place],  [place] [id number]  PCP: Loman Brooklyn, [place]  Neurology was asked to evaluate Susan Haney, an 88 year old Haney for a chief complaint of headaches.  Our recommendations of care will be communicated by shared medical record.    CC:  headaches, burning pain in legs  History provided from self  HPI:  Medical co-morbidities: HTN, endometrial cancer, diabetes  Susan Haney presents for evaluation of headaches and extremity pain. Headaches began 2 years ago. States that when Susan Haney leans Susan Haney head back Susan Haney will feel pressure in Susan back of Susan Haney head. Also will have sharp pains radiate up Susan right side of Susan Haney head. Will sometimes feel an internal itching sensation in Susan Haney scalp Headaches will last until Susan Haney sits up. No associated photophobia, phonophobia, or nausea. Headaches occur daily.  Susan Haney also has burning pain in bilateral legs. Denies burning in Susan Haney feet, states it is just in Susan Haney lower legs. Susan Haney has bilateral knee pain and swelling in both of Susan Haney legs (L>R). Susan Haney has had bilateral knee replacements. Susan Haney last knee surgery was in 2017 in Susan Haney left knee. Feels Susan Haney left leg pain started after Susan surgery, then moved to Susan Haney right leg last year.  Susan Haney has chronic lower back pain. MRI L-spine in 2016 showed severe multilevel degenerative changes with severe central stenosis at L3-4 and L5-S1. Susan Haney tried back injections which only slightly alleviated Susan pain. Saw neurosurgery who told Susan Haney Susan Haney was not a candidate for surgery.  Susan Haney also has chronic neck pain. Has numbness in Susan middle fingers in Susan Haney right hand. Susan Haney will also get shooting pain down Susan Haney right arm. Left hand will go numb. Had an EMG in 2019 and was told Susan Haney was told Susan Haney has carpal tunnel on Susan left. Was planned to get surgery for this, but was never able to have it done due to Susan pandemic. C-spine Xray in 04/2021 showed multilevel degenerative disc disease with foraminal narrowing at  left C3-4.   Susan Haney has tried multiple medications for Susan Haney neck/back pain, but has not been able to tolerate them due to side effects.  Prior Therapies                                 Lyrica 75 mg QHS - fatigue Gabapentin 100 mg QHS Amitriptyline 10 mg QHS Cymbalta 20 mg daily Lisinopril Back injections  LABS: CBC    Component Value Date/Time   WBC 6.0 05/06/2021 1043   WBC 13.2 (H) 04/14/2016 0600   RBC 4.29 05/06/2021 1043   RBC 3.38 (L) 04/14/2016 0600   HGB 12.0 05/06/2021 1043   HCT 36.3 05/06/2021 1043   PLT 277 05/06/2021 1043   MCV 85 05/06/2021 1043   MCH 28.0 05/06/2021 1043   MCH 27.8 04/14/2016 0600   MCHC 33.1 05/06/2021 1043   MCHC 31.5 04/14/2016 0600   RDW 13.2 05/06/2021 1043   LYMPHSABS 1.8 05/06/2021 1043   MONOABS 0.8 04/09/2016 0915   EOSABS 0.3 05/06/2021 1043   BASOSABS 0.0 05/06/2021 1043      Latest Ref Rng & Units 11/03/2021   10:18 AM 05/06/2021   10:43 AM 05/16/2020    8:09 AM  CMP  Glucose 70 - 99 mg/dL 111   98   88    BUN 8 - 27 mg/dL 19   21   25  Creatinine 0.57 - 1.00 mg/dL 1.24   1.14   1.22    Sodium 134 - 144 mmol/L 143   143   142    Potassium 3.5 - 5.2 mmol/L 4.8   4.1   4.1    Chloride 96 - 106 mmol/L 102   105   106    CO2 20 - 29 mmol/L '25   24   21    ' Calcium 8.7 - 10.3 mg/dL 9.9   9.9   9.2    Total Protein 6.0 - 8.5 g/dL 6.9   6.5   6.1    Total Bilirubin 0.0 - 1.2 mg/dL 0.4   0.3   0.2    Alkaline Phos 44 - 121 IU/L 60   57   52    AST 0 - 40 IU/L '16   Susan   Susan    ' ALT 0 - 32 IU/L '7   6   9       ' IMAGING:  [facility] 10/23/20: chronic microvascular ischemic disease  MRI L-spine 2016:  Severe multilevel lumbar spondylosis, facet arthrosis and rotoscoliosis. Severe multifactorial central stenosis with subarticular stenosis at L3-L4 and L5-S1.  C-spine Xray 04/2021: 1. Multilevel degenerative disc disease and facet hypertrophy with slight progression from prior exam. 2. Bony neural foraminal narrowing at C3-C4 on Susan  left.  Imaging independently reviewed on Dec 10, 2021   Current Outpatient Medications on File Prior to Visit  Medication Sig Dispense Refill   aspirin EC 81 MG tablet Take 81 mg by mouth daily.     Biotin 1000 MCG tablet Take 1,000 mcg by mouth daily.     cetirizine (ZYRTEC) 10 MG tablet Take 1 tablet (10 mg total) by mouth daily. 90 tablet 1   cholecalciferol (VITAMIN D) 400 UNITS TABS tablet Take 2,000 Units by mouth.     Cyanocobalamin (B-12 PO) Take by mouth.     dapagliflozin propanediol (FARXIGA) 10 MG TABS tablet Take 1 tablet (10 mg total) by mouth daily. 90 tablet 1   fluticasone (FLONASE) 50 MCG/ACT nasal spray SPRAY 2 SPRAYS INTO EACH NOSTRIL EVERY DAY 48 mL 2   glucose blood (ACCU-CHEK AVIVA PLUS) test strip Use to check Blood Sugars 1-2 times daily. Dx E11.9 100 each 12   lidocaine (LIDODERM) 5 % lidocaine 5 % topical patch  PLACE 1 PATCH ONTO Susan SKIN DAILY. REMOVE & DISCARD PATCH WITHIN 12 HOURS OR AS DIRECTED BY MD     lisinopril-hydrochlorothiazide (ZESTORETIC) 20-25 MG tablet TAKE 1 TABLET BY MOUTH DAILY 90 tablet 0   lubiprostone (AMITIZA) 8 MCG capsule TAKE 1 CAPSULE BY MOUTH  TWICE DAILY WITH A MEAL 180 capsule 3   Magnesium Oxide (MAG-200 PO) Take by mouth.     Misc Natural Products (TART CHERRY ADVANCED) CAPS Take by mouth daily.      montelukast (SINGULAIR) 10 MG tablet Take 1 tablet (10 mg total) by mouth at bedtime. 90 tablet 1   Multiple Vitamins-Minerals (MULTIVITAMIN WITH MINERALS) tablet Take 1 tablet by mouth daily.     nystatin-triamcinolone (MYCOLOG II) cream Apply 1 application topically 2 (two) times daily. 30 g 1   Omega-3 Fatty Acids (MAXEPA PO) Take 500 mg by mouth daily at 6 (six) AM.     psyllium (METAMUCIL) 58.6 % packet Take 1 packet by mouth daily.     triamcinolone cream (KENALOG) 0.5 % Apply 1 application topically 3 (three) times daily. 45 g 2   No current facility-administered medications on  file prior to visit.     Allergies: No Known  Allergies  Family History: Family History  Problem Relation Age of Onset   Prostate cancer Father 52   Arthritis Brother        back issues and knee    Diabetes Maternal Grandmother    Heart attack Maternal Grandfather    Liver cancer Sister    Cancer Other 45       possibly pancreatic   Cancer Other        possibly uterine or ovarian   Heart disease Son    Diabetes Son    Arthritis Son    Hypertension Son    Hyperlipidemia Son     Past Medical History: Past Medical History:  Diagnosis Date   Anemia    history ,  took iron for years   Arthritis    Bicuspid aortic valve    Cancer ([facility]) 2017   endometrial cancer / cervical   Cellulitis 12/31/2017   BLE, ankles   Chronic back pain    DDD (degenerative disc disease), lumbar    Endometrial cancer ([facility])    GERD (gastroesophageal reflux disease)    History of radiation therapy Susan/28/17-12/19/17   vaginal cuff 30 Gy in 5 fractions   Hypertension    Prediabetes    Spondylosis    lumbar    Past Surgical History Past Surgical History:  Procedure Laterality Date   BUNIONECTOMY     CARPAL TUNNEL RELEASE Right Susan/06/2018   Procedure: RIGHT CARPAL TUNNEL RELEASE;  Surgeon: Daryll Brod, MD;  Location: [facility];  Service: Orthopedics;  Laterality: Right;   COLONOSCOPY N/A 04/04/2014   Procedure: COLONOSCOPY;  Surgeon: Rogene Houston, MD;  [facility];  Service: Endoscopy;  Laterality: N/A;  100   EYE SURGERY     bil cataracts and lens implants   FOOT SURGERY     Left   Ovary removed: benign     REPLACEMENT TOTAL KNEE BILATERAL     2013   ROBOTIC ASSISTED TOTAL HYSTERECTOMY WITH BILATERAL SALPINGO OOPHERECTOMY Left 04/13/2016   Procedure: XI ROBOTIC ASSISTED TOTAL HYSTERECTOMY WITH LEFT SALPINGO OOPHORECTOMY SENTINEL LYMPH NODE BIOPSY;  Surgeon: Everitt Amber, MD;  [facility];  Service: Gynecology;  Laterality: Left;   TOTAL KNEE REVISION Left 02/12/2016   Procedure: LEFT TOTAL KNEE REVISION;   Surgeon: Rod Can, MD;  [facility];  Service: Orthopedics;  Laterality: Left;    Social History: Social History   Tobacco Use   Smoking status: Never   Smokeless tobacco: Never  Vaping Use   Vaping Use: Never used  Substance Use Topics   Alcohol use: No    Alcohol/week: 0.0 standard drinks   Drug use: No    ROS: Negative for fevers, chills. Positive for headaches, neck pain, back pain, paresthesias. All other systems reviewed and negative unless stated otherwise in HPI.   Physical Exam:   Vital Signs: BP 137/70   Pulse 69   Ht '5\' 3"'  (1.6 m)   Wt 175 lb (79.4 kg)   BMI 31.00 kg/m  GENERAL: well appearing,in no acute distress,alert SKIN:  +Reddening of skin and edema in bilateral legs L>R HEAD:  Normocephalic/atraumatic. CV:  RRR RESP: Normal respiratory effort MSK: +tenderness to palpation over right temple, occiput, neck, and shoulders  NEUROLOGICAL: Mental Status: Alert, oriented to person, place and time,Follows commands Cranial Nerves: PERRL, visual fields intact to confrontation, extraocular movements intact, diminished sensation right V1-2, no facial  droop or ptosis, hearing grossly intact, no dysarthria Motor: mildly decreased grip strength in left hand, 4+/5 bilateral knee flexion, otherwise 5/5 throughout Reflexes: 2+ throughout Sensation: decreased sensation to pin prick in left dorsum of hand, otherwise sensation intact to pin prick, vibration, and proprioception throughout Coordination: Finger-to- nose-finger intact bilaterally Gait: antalgic gait   IMPRESSION: 88 year old Haney with a history of cervical and lumbar spondylosis, HTN, endometrial cancer, diabetes who presents for evaluation of headaches, neck/back pain, and burning pain in Susan Haney extremities. Suspect Susan Haney symptoms are secondary to degenerative changes in Susan Haney cervical and lumbar spine. Susan Haney likely has an additional component of diabetic neuropathy. Suspect headaches are cervicogenic  vs occipital neuralgia. Will order brain MRI as Susan Haney does have decreased facial sensation on exam. ESR/CRP ordered to assess for GCA. Will start Pristiq for headaches and nerve pain and have Susan Haney return for an occipital nerve block.  PLAN: -MRI brain -ESR, CRP -Start Pristiq 25 mg daily -Return to clinic for occipital nerve block -next steps: consider PRN baclofen  I spent a total of 63 minutes chart reviewing and counseling Susan Haney. Headache education was done. Discussed treatment options including preventive medications and physical therapy.  Discussed medication side effects, adverse reactions and drug interactions. Written educational materials and Haney instructions outlining all of Susan above were given.  Follow-up: for nerve block   Genia Harold, MD 12/10/2021   Susan:09 AM

## 2021-12-10 ENCOUNTER — Telehealth: Payer: Self-pay | Admitting: Psychiatry

## 2021-12-10 ENCOUNTER — Ambulatory Visit: Payer: Medicare Other | Admitting: Psychiatry

## 2021-12-10 ENCOUNTER — Encounter: Payer: Self-pay | Admitting: Psychiatry

## 2021-12-10 VITALS — BP 137/70 | HR 69 | Ht 63.0 in | Wt 175.0 lb

## 2021-12-10 DIAGNOSIS — R519 Headache, unspecified: Secondary | ICD-10-CM | POA: Diagnosis not present

## 2021-12-10 MED ORDER — DESVENLAFAXINE SUCCINATE ER 25 MG PO TB24: 25.0000 mg | ORAL_TABLET | Freq: Every day | ORAL | 3 refills | Status: DC

## 2021-12-10 NOTE — Patient Instructions (Signed)
MRI brain Blood work to look for inflammation Start desvenlafaxine daily for headache prevention Schedule occipital nerve block

## 2021-12-10 NOTE — Telephone Encounter (Signed)
[employer] medicare NPR sent to GI they will call the patient to schedule 

## 2021-12-11 LAB — C-REACTIVE PROTEIN: CRP: 1 mg/L (ref 0–10)

## 2021-12-11 LAB — SEDIMENTATION RATE: Sed Rate: 9 mm/hr (ref 0–40)

## 2022-01-02 ENCOUNTER — Other Ambulatory Visit: Payer: Self-pay | Admitting: Psychiatry

## 2022-01-25 ENCOUNTER — Ambulatory Visit (INDEPENDENT_AMBULATORY_CARE_PROVIDER_SITE_OTHER): Payer: Medicare Other | Admitting: Gastroenterology

## 2022-01-25 ENCOUNTER — Encounter (INDEPENDENT_AMBULATORY_CARE_PROVIDER_SITE_OTHER): Payer: Self-pay | Admitting: Gastroenterology

## 2022-01-25 VITALS — BP 128/74 | HR 67 | Temp 98.4°F | Ht 63.0 in | Wt 174.9 lb

## 2022-01-25 DIAGNOSIS — K5904 Chronic idiopathic constipation: Secondary | ICD-10-CM

## 2022-01-25 DIAGNOSIS — K219 Gastro-esophageal reflux disease without esophagitis: Secondary | ICD-10-CM | POA: Diagnosis not present

## 2022-01-25 NOTE — Progress Notes (Signed)
Referring Provider: Loman Brooklyn, FNP Primary Care Physician:  Loman Brooklyn, FNP Primary GI Physician: previously [facility]  Chief Complaint  Patient presents with   Constipation    6 month follow up on constipation and GERD.    HPI:   CLAIRESSA BOULET is a 88 y.o. female with past medical history of anemia, arthritis, biscuspid aortic valve, DDD, GERD, HTN, prediabetes.   Patient presenting today for follow up of constipation and GERD  Last seen 07/28/21, at that time she reported BM almost daily, rarely with formed stool, stools usually loose and soft, on occasion going 2 days without a BM. Remained on lubiprostone, endorsed some abdominal pain if no BM in 2 days. GERD well controlled on Omeprazole, having occasional cough felt related to post nasal drip. Advised to discontinue colace, amitiza 42mg BID, continued on omeprazole '20mg'$  every other day. Advised to use glycerin or dulcolax suppository if going more than 1 day without BM.  Today, she states that she was previously on 265m of Amitiza before covid which was too strong for her. She is maintained on 53m52mBID at this time. She states that now she is having a small amount of stool almost daily. If she goes a couple of days without a good BM, she will take two Amitiza in the morning. She will use dulcolax gummies maybe twice per month if she feels more constipated or goes more than 2 days without a good BM. Previously on the Amitiza 53mc52mID, she was having to double up her dose more than she is now. She states that BMs are more soft and formed. She denies needing to strain. She has some occasional RLQ pressure at times where she feels more constipated, this improves once she is able to defecate. She has not seen any rectal bleeding or melena. She does note that on occasion she has she feels that she has a little irritation of her rectum, She was previously given a cream to use by Dr. RehmLaural Golden she dose not have to use it often.   She  does not drink as much water as she knows she needs too, she does report she drinks a lot of coffee recently.  GERD is well controlled, she is only taking her omeprazole as needed, reports she requires this only a few times per month. She tries to avoid eating late.   She is down about 4 pounds since her visit in January. She feels that she may be eating a little less and also reports she does not always eat like she should.   Last Colonoscopy:9/17/15Examination performed to cecum. Single left-sided diverticulum and external hemorrhoids otherwise normal colonoscopy. Last Endoscopy:  Past Medical History:  Diagnosis Date   Anemia    history ,  took iron for years   Arthritis    Bicuspid aortic valve    Cancer ([facility]   endometrial cancer / cervical   Cellulitis 12/31/2017   BLE, ankles   Chronic back pain    DDD (degenerative disc disease), lumbar    Endometrial cancer ([facility])[place] GERD (gastroesophageal reflux disease)    History of radiation therapy 06/15/16-07/06/16   vaginal cuff 30 Gy in 5 fractions   Hypertension    Prediabetes    Spondylosis    lumbar    Past Surgical History:  Procedure Laterality Date   BUNIONECTOMY     CARPAL TUNNEL RELEASE Right 05/30/2018   Procedure: RIGHT CARPAL TUNNEL RELEASE;  Surgeon: KuzmFredna Dow  Dominica Severin, MD;  Location: [facility];  Service: Orthopedics;  Laterality: Right;   COLONOSCOPY N/A 04/04/2014   Procedure: COLONOSCOPY;  Surgeon: Rogene Houston, MD;  [facility];  Service: Endoscopy;  Laterality: N/A;  100   EYE SURGERY     bil cataracts and lens implants   FOOT SURGERY     Left   Ovary removed: benign     REPLACEMENT TOTAL KNEE BILATERAL     2013   ROBOTIC ASSISTED TOTAL HYSTERECTOMY WITH BILATERAL SALPINGO OOPHERECTOMY Left 04/13/2016   Procedure: XI ROBOTIC ASSISTED TOTAL HYSTERECTOMY WITH LEFT SALPINGO OOPHORECTOMY SENTINEL LYMPH NODE BIOPSY;  Surgeon: Everitt Amber, MD;  [facility];  Service: Gynecology;   Laterality: Left;   TOTAL KNEE REVISION Left 02/12/2016   Procedure: LEFT TOTAL KNEE REVISION;  Surgeon: Rod Can, MD;  [facility];  Service: Orthopedics;  Laterality: Left;    Current Outpatient Medications  Medication Sig Dispense Refill   aspirin EC 81 MG tablet Take 81 mg by mouth daily.     Biotin 1000 MCG tablet Take 1,000 mcg by mouth daily.     cetirizine (ZYRTEC) 10 MG tablet Take 1 tablet (10 mg total) by mouth daily. 90 tablet 1   cholecalciferol (VITAMIN D) 400 UNITS TABS tablet Take 2,000 Units by mouth.     Cyanocobalamin (B-12 PO) Take by mouth.     dapagliflozin propanediol (FARXIGA) 10 MG TABS tablet Take 1 tablet (10 mg total) by mouth daily. 90 tablet 1   desvenlafaxine (PRISTIQ) 25 MG 24 hr tablet TAKE 1 TABLET (25 MG TOTAL) BY MOUTH DAILY. 90 tablet 0   fluticasone (FLONASE) 50 MCG/ACT nasal spray SPRAY 2 SPRAYS INTO EACH NOSTRIL EVERY DAY 48 mL 2   glucose blood (ACCU-CHEK AVIVA PLUS) test strip Use to check Blood Sugars 1-2 times daily. Dx E11.9 100 each 12   lidocaine (LIDODERM) 5 % lidocaine 5 % topical patch  PLACE 1 PATCH ONTO THE SKIN DAILY. REMOVE & DISCARD PATCH WITHIN 12 HOURS OR AS DIRECTED BY MD     lisinopril-hydrochlorothiazide (ZESTORETIC) 20-25 MG tablet TAKE 1 TABLET BY MOUTH DAILY 90 tablet 0   lubiprostone (AMITIZA) 8 MCG capsule TAKE 1 CAPSULE BY MOUTH  TWICE DAILY WITH A MEAL 180 capsule 3   Magnesium Oxide (MAG-200 PO) Take by mouth.     Misc Natural Products (TART CHERRY ADVANCED) CAPS Take by mouth daily.      montelukast (SINGULAIR) 10 MG tablet Take 1 tablet (10 mg total) by mouth at bedtime. 90 tablet 1   Multiple Vitamins-Minerals (MULTIVITAMIN WITH MINERALS) tablet Take 1 tablet by mouth daily.     nystatin-triamcinolone (MYCOLOG II) cream Apply 1 application topically 2 (two) times daily. 30 g 1   Omega-3 Fatty Acids (MAXEPA PO) Take 500 mg by mouth daily at 6 (six) AM.     psyllium (METAMUCIL) 58.6 % packet Take 1 packet by  mouth daily.     triamcinolone cream (KENALOG) 0.5 % Apply 1 application topically 3 (three) times daily. 45 g 2   No current facility-administered medications for this visit.    Allergies as of 01/25/2022   (No Known Allergies)    Family History  Problem Relation Age of Onset   Prostate cancer Father 61   Arthritis Brother        back issues and knee    Diabetes Maternal Grandmother    Heart attack Maternal Grandfather    Liver cancer Sister  Cancer Other 28       possibly pancreatic   Cancer Other        possibly uterine or ovarian   Heart disease Son    Diabetes Son    Arthritis Son    Hypertension Son    Hyperlipidemia Son     Social History   Socioeconomic History   Marital status: Widowed    Spouse name: Not on file   Number of children: 2   Years of education: Not on file   Highest education level: Not on file  Occupational History   Occupation: retired  Tobacco Use   Smoking status: Never   Smokeless tobacco: Never  Vaping Use   Vaping Use: Never used  Substance and Sexual Activity   Alcohol use: No    Alcohol/week: 0.0 standard drinks of alcohol   Drug use: No   Sexual activity: Not Currently  Other Topics Concern   Not on file  Social History Narrative   2 sons - in [place] and [place]   Disabled sister lives close by   Social Determinants of Health   Financial Resource Strain: [place] (05/28/2021)   Overall Financial Resource Strain (CARDIA)    Difficulty of Paying Living Expenses: Somewhat hard  Food Insecurity: No Food Insecurity (05/28/2021)   Hunger Vital Sign    Worried About Running Out of Food in the Last Year: Never true    [place] in the Last Year: Never true  Transportation Needs: No Transportation Needs (05/28/2021)   PRAPARE - Hydrologist (Medical): No    Lack of Transportation (Non-Medical): No  Physical Activity: Sufficiently Active (05/28/2021)   Exercise Vital Sign    Days of  Exercise per Week: 7 days    Minutes of Exercise per Session: 60 min  Stress: No Stress Concern Present (05/28/2021)   [place]    Feeling of Stress : Only a little  Social Connections: Moderately Isolated (05/28/2021)   Social Connection and Isolation Panel [NHANES]    Frequency of Communication with Friends and Family: More than three times a week    Frequency of Social Gatherings with Friends and Family: More than three times a week    Attends Religious Services: More than 4 times per year    Active Member of [employer] or Organizations: No    Attends Archivist Meetings: Never    Marital Status: Widowed   Review of systems General: negative for malaise, night sweats, fever, chills, +weight loss Neck: Negative for lumps, goiter, pain and significant neck swelling Resp: Negative for cough, wheezing, dyspnea at rest CV: Negative for chest pain, leg swelling, palpitations, orthopnea GI: denies melena, hematochezia, nausea, vomiting, diarrhea, constipation, dysphagia, odyonophagia, early satiety +mild weight loss MSK: Negative for joint pain or swelling, back pain, and muscle pain. Derm: Negative for itching or rash Psych: Denies depression, anxiety, memory loss, confusion. No homicidal or suicidal ideation.  Heme: Negative for prolonged bleeding, bruising easily, and swollen nodes. Endocrine: Negative for cold or heat intolerance, polyuria, polydipsia and goiter. Neuro: negative for tremor, gait imbalance, syncope and seizures. The remainder of the review of systems is noncontributory.  Physical Exam: There were no vitals taken for this visit. General:   Alert and oriented. No distress noted. Pleasant and cooperative.  Head:  Normocephalic and atraumatic. Eyes:  Conjuctiva clear without scleral icterus. Mouth:  Oral mucosa pink and moist. Good dentition. No  lesions. Heart: Normal rate and rhythm, s1 and s2  heart sounds present.  Lungs: Clear lung sounds in all lobes. Respirations equal and unlabored. Abdomen:  +BS, soft, non-tender and non-distended. No rebound or guarding. No HSM or masses noted. Derm: No palmar erythema or jaundice Msk:  Symmetrical without gross deformities. Normal posture. Extremities:  Without edema. Neurologic:  Alert and  oriented x4 Psych:  Alert and cooperative. Normal mood and affect.  Invalid input(s): "6 MONTHS"   ASSESSMENT: FLOELLA ENSZ is a 88 y.o. female presenting today for follow up of GERD and Constipation.  GERD, well managed with dietary modifications, avoiding eating late in the evenings. she is using PPI only as needed, maybe a few times per month, this works well for her. She denies dysphagia or odynophagia. No early satiety, nausea or vomiting. She should continue with reflux precautions.   Constipation is well managed on Amitiza 26mg BID, she occasionally doubles her morning dose of Amitiza and will use dulcolax gummies maybe twice per month if she does goes a few days without a good BM. Stools are soft but formed and she feels that current regimen is working well for her. She does not drink as much water as she should and reports she probably does not eat as well as she should. I encouraged her to increase water intake and fruits, veggies and whole grains. She denies rectal bleeding or melena.  In regards to mild weight loss, down 4 pounds since January, though she tells me she is a little less active and is eating less than previously as she lives alone and does not always eat like she should. We will continue to monitor her weight, if she continues to trend down, we discussed there may need to be further evaluation of this.  I encouraged her to eat 3 meals per day and she can add boost/ensure shake 1-2x/day to help maintain nutrition if she is not eating a well balanced diet.   PLAN:  Continue amitiza 880m BID, dulcolax PRN 2. Increase water  intake 3. Increase fruits, veggies and whole grains 4. Use PPI as needed, continue reflux precautions 5. Boost/ensure protein shakes 1-2x/day  All questions were answered, patient verbalized understanding and is in agreement with plan as outlined above.   Follow Up: 6 months   Tela Kotecki L. CaAlver SorrowMSN, APRN, AGNP-C Adult-Gerontology Nurse Practitioner [facility]

## 2022-01-25 NOTE — Patient Instructions (Signed)
It was very nice to meet you! We will continue with current regimen of amitiza 68mg twice a day and dulcolax as needed You may also continue using your omeprazole as needed since this is working well for you, please continue to be mindful of specific trigger foods to include greasy, spicy, fried, citrus foods, caffeine, carbonated drinks, and chocolate. Stay upright 2-3 hours after eating, prior to lying down and avoid eating late in the evenings. Please make sure you are staying well hydrated and eating a diet high in fruits, veggies and whole grains We will keep an eye on your weight, I suspect small amount of weight loss is secondary to decrease in your intake, I would encourage you to continue trying to do 3 meals per day, you can even add in 1-2 ensure/boost protein shakes as a snack or before bed to help with added nutrients  Follow up 6 months

## 2022-01-27 ENCOUNTER — Other Ambulatory Visit: Payer: Self-pay | Admitting: Family Medicine

## 2022-01-28 ENCOUNTER — Ambulatory Visit (INDEPENDENT_AMBULATORY_CARE_PROVIDER_SITE_OTHER): Payer: Medicare Other | Admitting: Psychiatry

## 2022-01-28 VITALS — BP 153/66 | HR 77

## 2022-01-28 DIAGNOSIS — M542 Cervicalgia: Secondary | ICD-10-CM | POA: Diagnosis not present

## 2022-01-28 DIAGNOSIS — M5481 Occipital neuralgia: Secondary | ICD-10-CM

## 2022-01-28 NOTE — Progress Notes (Signed)
Procedure: Occipital Nerve injection/Trigger point injection  Location: bilateral occiput  The risks, benefits and anticipated outcomes of the procedure, the risks and benefits of the alternatives to the procedure, and the roles and tasks of the personnel to be involved, were discussed with the patient, and the patient consents to the procedure and agrees to proceed.     A combination of 1 cc betamethasone 6 mg and 4 cc of 0.25% bupivacaine were prepared in 2 syringes (5 cc).  2 trigger points on the splenius capitus were identified and injected. The left and right greater occipital nerves were injected 3cm caudal and 1.5 cm lateral to the inion where the main trunk of the occipital nerve penetrates the semispinalis muscle.  The needle was placed perpendicular and the needle advanced 1.5 cm. After aspiration to ensure no obstruction or presence of blood, the area was injected.  The needle was repositioned in a fan-like manner and the entire area was injected. Pressure was held and no hematoma was noted.   Genia Harold, MD 01/28/22 10:40 AM

## 2022-01-28 NOTE — Patient Instructions (Signed)
Try taking Pristiq (desvenlafaxine) in the morning

## 2022-02-09 ENCOUNTER — Ambulatory Visit
Admission: RE | Admit: 2022-02-09 | Discharge: 2022-02-09 | Disposition: A | Discharge status: Home / Self Care | Payer: Medicare Other | Source: Ambulatory Visit | Attending: Psychiatry | Admitting: Psychiatry

## 2022-02-09 DIAGNOSIS — R519 Headache, unspecified: Secondary | ICD-10-CM

## 2022-02-10 ENCOUNTER — Encounter: Payer: Self-pay | Admitting: Family Medicine

## 2022-02-10 ENCOUNTER — Ambulatory Visit (INDEPENDENT_AMBULATORY_CARE_PROVIDER_SITE_OTHER): Payer: Medicare Other | Admitting: Family Medicine

## 2022-02-10 VITALS — BP 135/77 | HR 86 | Temp 97.7°F | Ht 66.0 in | Wt 174.6 lb

## 2022-02-10 DIAGNOSIS — R208 Other disturbances of skin sensation: Secondary | ICD-10-CM

## 2022-02-10 DIAGNOSIS — I10 Essential (primary) hypertension: Secondary | ICD-10-CM

## 2022-02-10 DIAGNOSIS — N1832 Chronic kidney disease, stage 3b: Secondary | ICD-10-CM

## 2022-02-10 DIAGNOSIS — R519 Headache, unspecified: Secondary | ICD-10-CM | POA: Diagnosis not present

## 2022-02-10 MED ORDER — LISINOPRIL-HYDROCHLOROTHIAZIDE 20-25 MG PO TABS: 1.0000 | ORAL_TABLET | Freq: Every day | ORAL | 1 refills | Status: DC

## 2022-02-10 NOTE — Progress Notes (Signed)
Assessment & Plan:  1. Essential hypertension Well controlled on current regimen.  - lisinopril-hydrochlorothiazide (ZESTORETIC) 20-25 MG tablet; Take 1 tablet by mouth daily.  Dispense: 90 tablet; Refill: 1 - BMP8+EGFR; Future  2. Stage 3b chronic kidney disease (HCC) Continue current regimen. - BMP8+EGFR; Future  3. Burning sensation of lower extremity Resolved.  4. Daily headache Now managed by neurology.   Return in about 3 months (around 05/13/2022) for follow-up of chronic medication conditions.  Hendricks Limes, MSN, APRN, FNP-C [facility]  Subjective:    Patient ID: Susan Haney, female    DOB: 08/22/1931, 88 y.o.   MRN: [id number]  Patient Care Team: Loman Brooklyn, FNP as PCP - General (Family Medicine) Celestia Khat, [place] (Optometry) Blanca Friend Royce Macadamia, [facility] as [place] Management (Pharmacist) Ilean China, RN as Case Manager   Chief Complaint:  Chief Complaint  Patient presents with   Medical Management of Chronic Issues    HPI: Susan Haney is a 88 y.o. female presenting on 02/10/2022 for Medical Management of Chronic Issues  She checks her blood pressure at home and gets readings 117/66. She states she eats a low sodium diet and tries to get exercise. She is taking lisinopril-HCTZ for her blood pressure.   She was diagnosed with prediabetes several years ago and was placed on Farxiga last year due to chronic kidney disease.   Patient has ongoing issues with daily headaches and neck pain.  This has been going on for a year now.  The headache is dull at the back of her head.  She is only able to sleep on her back with her head turned a certain way, or else her neck starts hurting.  She had a head CT completed in April 2022 which did not reveal any acute abnormality.  She had a cervical spine x-ray completed in October 2022 which showed multilevel degenerative disc disease. She is now seeing neurology who  discontinued amitriptyline and started Pristiq. She completed her brian MRI yesterday. They completed an occipital nerve block earlier this week.   She has had ongoing issues with swelling in her lower extremities. She does not wear compression socks. She states she mostly has swelling around her ankles. She also experiences burning, tingling, and restless legs at night that she is not sure if it is related to her swelling. At our last visit she was started on amitriptyline to help with the abnormal sensation, but reports today neurology stopped this and started Pristiq which resolved her swelling and improved the sensations.   She has generalized joint pain she takes tylenol for.    Social history:  Relevant past medical, surgical, family and social history reviewed and updated as indicated. Interim medical history since our last visit reviewed.  Allergies and medications reviewed and updated.  DATA REVIEWED: CHART IN EPIC  ROS: Negative unless specifically indicated above in HPI.    Current Outpatient Medications:    aspirin EC 81 MG tablet, Take 81 mg by mouth daily., Disp: , Rfl:    Biotin 1000 MCG tablet, Take 1,000 mcg by mouth daily., Disp: , Rfl:    cetirizine (ZYRTEC) 10 MG tablet, Take 1 tablet (10 mg total) by mouth daily., Disp: 90 tablet, Rfl: 1   cholecalciferol (VITAMIN D) 400 UNITS TABS tablet, Take 2,000 Units by mouth., Disp: , Rfl:    Cyanocobalamin (B-12 PO), Take by mouth., Disp: , Rfl:    dapagliflozin propanediol (FARXIGA) 10 MG TABS  tablet, Take 1 tablet (10 mg total) by mouth daily., Disp: 90 tablet, Rfl: 1   desvenlafaxine (PRISTIQ) 25 MG 24 hr tablet, TAKE 1 TABLET (25 MG TOTAL) BY MOUTH DAILY., Disp: 90 tablet, Rfl: 0   fluticasone (FLONASE) 50 MCG/ACT nasal spray, SPRAY 2 SPRAYS INTO EACH NOSTRIL EVERY DAY, Disp: 48 mL, Rfl: 2   glucose blood (ACCU-CHEK AVIVA PLUS) test strip, Use to check Blood Sugars 1-2 times daily. Dx E11.9, Disp: 100 each, Rfl: 12    lidocaine (LIDODERM) 5 %, lidocaine 5 % topical patch  PLACE 1 PATCH ONTO THE SKIN DAILY. REMOVE & DISCARD PATCH WITHIN 12 HOURS OR AS DIRECTED BY MD, Disp: , Rfl:    lubiprostone (AMITIZA) 8 MCG capsule, TAKE 1 CAPSULE BY MOUTH  TWICE DAILY WITH A MEAL, Disp: 180 capsule, Rfl: 3   Magnesium Oxide (MAG-200 PO), Take by mouth., Disp: , Rfl:    Misc Natural Products (TART CHERRY ADVANCED) CAPS, Take by mouth daily. , Disp: , Rfl:    montelukast (SINGULAIR) 10 MG tablet, TAKE 1 TABLET BY MOUTH AT  BEDTIME, Disp: 100 tablet, Rfl: 2   Multiple Vitamins-Minerals (MULTIVITAMIN WITH MINERALS) tablet, Take 1 tablet by mouth daily., Disp: , Rfl:    nystatin-triamcinolone (MYCOLOG II) cream, Apply 1 application topically 2 (two) times daily., Disp: 30 g, Rfl: 1   Omega-3 Fatty Acids (MAXEPA PO), Take 500 mg by mouth daily at 6 (six) AM., Disp: , Rfl:    omeprazole (PRILOSEC) 20 MG capsule, Take 20 mg by mouth. Takes as needed, Disp: , Rfl:    psyllium (METAMUCIL) 58.6 % packet, Take 1 packet by mouth daily., Disp: , Rfl:    triamcinolone cream (KENALOG) 0.5 %, Apply 1 application topically 3 (three) times daily., Disp: 45 g, Rfl: 2   lisinopril-hydrochlorothiazide (ZESTORETIC) 20-25 MG tablet, Take 1 tablet by mouth daily., Disp: 90 tablet, Rfl: 1  No Known Allergies Past Medical History:  Diagnosis Date   Anemia    history ,  took iron for years   Arthritis    Bicuspid aortic valve    Cancer ([place]) 2017   endometrial cancer / cervical   Cellulitis 12/31/2017   BLE, ankles   Chronic back pain    DDD (degenerative disc disease), lumbar    Endometrial cancer ([place])    GERD (gastroesophageal reflux disease)    History of radiation therapy 06/15/16-07/06/16   vaginal cuff 30 Gy in 5 fractions   Hypertension    Prediabetes    Spondylosis    lumbar    Past Surgical History:  Procedure Laterality Date   BUNIONECTOMY     CARPAL TUNNEL RELEASE Right 05/30/2018   Procedure: RIGHT CARPAL TUNNEL  RELEASE;  Surgeon: Daryll Brod, MD;  Location: [facility];  Service: Orthopedics;  Laterality: Right;   COLONOSCOPY N/A 04/04/2014   Procedure: COLONOSCOPY;  Surgeon: Rogene Houston, MD;  [facility];  Service: Endoscopy;  Laterality: N/A;  100   EYE SURGERY     bil cataracts and lens implants   FOOT SURGERY     Left   Ovary removed: benign     REPLACEMENT TOTAL KNEE BILATERAL     2013   ROBOTIC ASSISTED TOTAL HYSTERECTOMY WITH BILATERAL SALPINGO OOPHERECTOMY Left 04/13/2016   Procedure: XI ROBOTIC ASSISTED TOTAL HYSTERECTOMY WITH LEFT SALPINGO OOPHORECTOMY SENTINEL LYMPH NODE BIOPSY;  Surgeon: Everitt Amber, MD;  Location: [facility];  Service: Gynecology;  Laterality: Left;   TOTAL KNEE REVISION Left 02/12/2016  Procedure: LEFT TOTAL KNEE REVISION;  Surgeon: Rod Can, MD;  Location: [facility];  Service: Orthopedics;  Laterality: Left;    Social History   Socioeconomic History   Marital status: Widowed    Spouse name: Not on file   Number of children: 2   Years of education: Not on file   Highest education level: Not on file  Occupational History   Occupation: retired  Tobacco Use   Smoking status: Never    Passive exposure: Past   Smokeless tobacco: Never  Vaping Use   Vaping Use: Never used  Substance and Sexual Activity   Alcohol use: No    Alcohol/week: 0.0 standard drinks of alcohol   Drug use: No   Sexual activity: Not Currently  Other Topics Concern   Not on file  Social History Narrative   2 sons - in [place] and [place]   Disabled sister lives close by   Social Determinants of Health   Financial Resource Strain: [place] (05/28/2021)   Overall Financial Resource Strain (CARDIA)    Difficulty of Paying Living Expenses: Somewhat hard  Food Insecurity: No Food Insecurity (05/28/2021)   Hunger Vital Sign    Worried About Running Out of Food in the Last Year: Never true    [place] in the Last Year: Never true  Transportation  Needs: No Transportation Needs (05/28/2021)   PRAPARE - Hydrologist (Medical): No    Lack of Transportation (Non-Medical): No  Physical Activity: Sufficiently Active (05/28/2021)   Exercise Vital Sign    Days of Exercise per Week: 7 days    Minutes of Exercise per Session: 60 min  Stress: No Stress Concern Present (05/28/2021)   [place]    Feeling of Stress : Only a little  Social Connections: Moderately Isolated (05/28/2021)   Social Connection and Isolation Panel [NHANES]    Frequency of Communication with Friends and Family: More than three times a week    Frequency of Social Gatherings with Friends and Family: More than three times a week    Attends Religious Services: More than 4 times per year    Active Member of [employer] or Organizations: No    Attends Archivist Meetings: Never    Marital Status: Widowed  Intimate Partner Violence: Not At Risk (05/28/2021)   Humiliation, Afraid, Rape, and Kick questionnaire    Fear of Current or Ex-Partner: No    Emotionally Abused: No    Physically Abused: No    Sexually Abused: No        Objective:    BP 135/77   Pulse 86   Temp 97.7 F (36.5 C) (Temporal)   Ht '5\' 6"'  (1.676 m)   Wt 174 lb 9.6 oz (79.2 kg)   SpO2 97%   BMI 28.18 kg/m   Wt Readings from Last 3 Encounters:  02/10/22 174 lb 9.6 oz (79.2 kg)  01/25/22 174 lb 14.4 oz (79.3 kg)  12/10/21 175 lb (79.4 kg)    Physical Exam Vitals reviewed.  Constitutional:      General: She is not in acute distress.    Appearance: Normal appearance. She is overweight. She is not ill-appearing, toxic-appearing or diaphoretic.  HENT:     Head: Normocephalic and atraumatic.  Eyes:     General: No scleral icterus.       Right eye: No discharge.        Left eye:  No discharge.     Conjunctiva/sclera: Conjunctivae normal.  Cardiovascular:     Rate and Rhythm: Normal rate and  regular rhythm.     Heart sounds: Normal heart sounds. No murmur heard.    No friction rub. No gallop.  Pulmonary:     Effort: Pulmonary effort is normal. No respiratory distress.     Breath sounds: Normal breath sounds. No stridor. No wheezing, rhonchi or rales.  Musculoskeletal:     Cervical back: Tenderness present. Decreased range of motion.     Right lower leg: No edema.     Left lower leg: No edema.  Skin:    General: Skin is warm and dry.     Capillary Refill: Capillary refill takes less than 2 seconds.  Neurological:     General: No focal deficit present.     Mental Status: She is alert and oriented to person, place, and time. Mental status is at baseline.  Psychiatric:        Mood and Affect: Mood normal.        Behavior: Behavior normal.        Thought Content: Thought content normal.        Judgment: Judgment normal.     Lab Results  Component Value Date   TSH 1.640 06/02/2021   Lab Results  Component Value Date   WBC 6.0 05/06/2021   HGB 12.0 05/06/2021   HCT 36.3 05/06/2021   MCV 85 05/06/2021   PLT 277 05/06/2021   Lab Results  Component Value Date   NA 143 11/03/2021   K 4.8 11/03/2021   CO2 25 11/03/2021   GLUCOSE 111 (H) 11/03/2021   BUN 19 11/03/2021   CREATININE 1.24 (H) 11/03/2021   BILITOT 0.4 11/03/2021   ALKPHOS 60 11/03/2021   AST 16 11/03/2021   ALT 7 11/03/2021   PROT 6.9 11/03/2021   ALBUMIN 4.4 11/03/2021   CALCIUM 9.9 11/03/2021   ANIONGAP 9 05/23/2018   EGFR 42 (L) 11/03/2021   Lab Results  Component Value Date   CHOL 161 05/06/2021   Lab Results  Component Value Date   HDL 49 05/06/2021   Lab Results  Component Value Date   LDLCALC 91 05/06/2021   Lab Results  Component Value Date   TRIG 116 05/06/2021   Lab Results  Component Value Date   CHOLHDL 3.3 05/06/2021   Lab Results  Component Value Date   HGBA1C 5.5 09/08/2021

## 2022-02-15 ENCOUNTER — Ambulatory Visit: Payer: Self-pay | Admitting: *Deleted

## 2022-02-15 NOTE — Chronic Care Management (AMB) (Signed)
  Chronic Care Management   Note  02/15/2022 Name: Susan Haney MRN: [id number] DOB: 06/06/1932   Due to changes in the Chronic Care Management program, I am removing myself as the [place] from the Care Team and closing [place]. Patient will be followed by the RN Care Coordination nurse for [facility].   Patient does not have an open Care Plan with another CCM team member. Patient does not have a current CCM referral placed since 11/16/21. CCM enrollment status changed to "note enrolled".   Patient's PCP can place a new referral if the they needs Care Management or Care Coordination services in the future.  Chong Sicilian, BSN, RN-BC Proofreader Dial: [phone]

## 2022-02-15 NOTE — Patient Instructions (Signed)
Susan Haney  At some point during the past 4 years, I have worked with you through the [facility] Management Program at [facility].  Due to program changes I am removing myself from your care team.   If you are currently active with another CCM Team Member, you will remain active with them unless they reach out to you with additional information.   If you feel that you need services in the future,  please talk with your primary care provider and request a new referral for Care Management or Care Coordination services. This does not affect your status as a patient at [facility].   Thank you for allowing me to participate in your your healthcare journey.  Chong Sicilian, BSN, RN-BC Proofreader Dial: [phone]

## 2022-03-18 ENCOUNTER — Other Ambulatory Visit: Payer: Self-pay | Admitting: Family Medicine

## 2022-03-18 DIAGNOSIS — N1832 Chronic kidney disease, stage 3b: Secondary | ICD-10-CM

## 2022-04-02 ENCOUNTER — Telehealth: Payer: Self-pay

## 2022-04-02 NOTE — Telephone Encounter (Signed)
Patient called c/o SOB, hoarse. Just getting over COVID from two weeks ago - appt was made with Tiffany on Monday.    Called patient back to discuss symptoms in more detail.  Patient tested positive for COVID on Aug 28 or 29 - she is felling better and has been on medication. She denies any fevers, but is still have SOB with heavy activity - states that she walks everyday and breathing is find but if she gets in a hurry with her activities she has to stop to catch her breath.  Denies any fevers, changes in heart rate, dizziness, etc.  Advised that if SOB gets worse over the weekend or if it occurs more often and with less or no activity to seek treatment at the ED or urgent care - preferably one that has xray on site.   Patient expressed understanding

## 2022-04-03 ENCOUNTER — Other Ambulatory Visit: Payer: Self-pay | Admitting: Psychiatry

## 2022-04-05 ENCOUNTER — Ambulatory Visit (INDEPENDENT_AMBULATORY_CARE_PROVIDER_SITE_OTHER): Payer: Medicare Other

## 2022-04-05 ENCOUNTER — Encounter: Payer: Self-pay | Admitting: Family Medicine

## 2022-04-05 ENCOUNTER — Ambulatory Visit (INDEPENDENT_AMBULATORY_CARE_PROVIDER_SITE_OTHER): Payer: Medicare Other | Admitting: Family Medicine

## 2022-04-05 VITALS — BP 117/74 | HR 57 | Temp 97.0°F | Ht 66.0 in | Wt 173.4 lb

## 2022-04-05 DIAGNOSIS — G8929 Other chronic pain: Secondary | ICD-10-CM

## 2022-04-05 DIAGNOSIS — J0140 Acute pansinusitis, unspecified: Secondary | ICD-10-CM

## 2022-04-05 DIAGNOSIS — M25561 Pain in right knee: Secondary | ICD-10-CM

## 2022-04-05 DIAGNOSIS — M25562 Pain in left knee: Secondary | ICD-10-CM | POA: Diagnosis not present

## 2022-04-05 DIAGNOSIS — R0602 Shortness of breath: Secondary | ICD-10-CM

## 2022-04-05 MED ORDER — BENZONATATE 100 MG PO CAPS: 100.0000 mg | ORAL_CAPSULE | Freq: Three times a day (TID) | ORAL | 0 refills | Status: DC | PRN

## 2022-04-05 MED ORDER — AMOXICILLIN 875 MG PO TABS: 875.0000 mg | ORAL_TABLET | Freq: Two times a day (BID) | ORAL | 0 refills | Status: AC

## 2022-04-05 NOTE — Progress Notes (Signed)
Acute Office Visit  Subjective:     Patient ID: Susan Haney, female    DOB: [birth year], 88 y.o.   MRN: [id number]  Chief Complaint  Patient presents with   chest congestion    Shortness of Breath    Had covid x 1 month ago and still having symptoms    Knee Pain    Bilateral- patient states she always has knee pain but it has been worse since covid     HPI Patient is in today for chest congestion and shortness of breath for the last 4 weeks since having Covid. Her cough is now non productive. She reports mild shortness of breath with increased activity. She denies chest pain, fever, or chills. She has had headaches and pressure around her eyes. She reports a chronic runny nose. She has a history of chronic sinusitis. She has been taking singular and tylenol. She has not had her zyrtec in a few weeks.   She also reports knee pain for the last month. She has chronic knee pain and has had both replaced many years ago. She last saw ortho about 1 year ago. She takes tylenol with some relief. Denies swelling, erythema, or injury.   ROS As per HPI.      Objective:    BP 117/74 Comment: at home this morning  Pulse (!) 57   Temp (!) 97 F (36.1 C) (Temporal)   Ht '5\' 6"'$  (1.676 m)   Wt 173 lb 6.4 oz (78.7 kg)   SpO2 97%   BMI 27.99 kg/m    Physical Exam Vitals and nursing note reviewed.  Constitutional:      General: She is not in acute distress.    Appearance: She is not ill-appearing, toxic-appearing or diaphoretic.  HENT:     Head: Normocephalic and atraumatic.     Right Ear: Tympanic membrane, ear canal and external ear normal.     Left Ear: Tympanic membrane, ear canal and external ear normal.     Nose: Congestion present.     Right Sinus: Maxillary sinus tenderness and frontal sinus tenderness present.     Left Sinus: Maxillary sinus tenderness and frontal sinus tenderness present.     Mouth/Throat:     Mouth: Mucous membranes are moist.     Pharynx: No pharyngeal  swelling or oropharyngeal exudate.  Eyes:     General:        Right eye: No discharge.        Left eye: No discharge.  Cardiovascular:     Rate and Rhythm: Normal rate and regular rhythm.     Heart sounds: Normal heart sounds. No murmur heard. Pulmonary:     Effort: Pulmonary effort is normal.     Breath sounds: Normal breath sounds.  Musculoskeletal:     Cervical back: Neck supple. No rigidity.     Right knee: No swelling, erythema or ecchymosis.     Left knee: No swelling, erythema or ecchymosis.     Right lower leg: No edema.     Left lower leg: No edema.  Skin:    General: Skin is warm and dry.  Neurological:     Mental Status: She is alert and oriented to person, place, and time.  Psychiatric:        Mood and Affect: Mood normal.        Behavior: Behavior normal.        Thought Content: Thought content normal.  Judgment: Judgment normal.     No results found for any visits on 04/05/22.      Assessment & Plan:   Madason was seen today for chest congestion , shortness of breath and knee pain.  Diagnoses and all orders for this visit:  Shortness of breath Mild, post Covid. Negative CXR today.  -     DG Chest 2 View; Future  Acute non-recurrent pansinusitis Amoxicillin as below. Restart zyrtec.  -     amoxicillin (AMOXIL) 875 MG tablet; Take 1 tablet (875 mg total) by mouth 2 (two) times daily for 10 days. -     benzonatate (TESSALON PERLES) 100 MG capsule; Take 1 capsule (100 mg total) by mouth 3 (three) times daily as needed for cough.  Bilateral chronic knee pain Worsening over last month, no red flags. Recommended follow up with ortho, voltaren gel, RICE therapy.    Return if symptoms worsen or fail to improve.  The patient indicates understanding of these issues and agrees with the plan.  Gwenlyn Perking, FNP

## 2022-04-05 NOTE — Patient Instructions (Signed)

## 2022-05-12 ENCOUNTER — Ambulatory Visit: Payer: Medicare Other | Admitting: Psychiatry

## 2022-05-12 VITALS — BP 133/81 | HR 81 | Ht 63.0 in | Wt 174.5 lb

## 2022-05-12 DIAGNOSIS — R404 Transient alteration of awareness: Secondary | ICD-10-CM

## 2022-05-12 DIAGNOSIS — M542 Cervicalgia: Secondary | ICD-10-CM

## 2022-05-12 DIAGNOSIS — G4486 Cervicogenic headache: Secondary | ICD-10-CM

## 2022-05-12 NOTE — Progress Notes (Signed)
 CC:  headaches  Follow-up Visit  Last visit: 12/10/21  Brief HPI: 88 year old female with a history of cervical and lumbar spondylosis, HTN, endometrial cancer, diabetes who follows in clinic for headaches and leg pain. ESR/CRP were normal.  At her last visit, brain MRI was ordered. She was started on Pristiq for headaches and nerve pain.  Interval History: Headaches have gotten a little worse over time. If she lies her head up against something like a pillow it will trigger her headache. She also has pain shooting from her ear to her temples. Occipital nerve block was done in July but this was not very effective. Pristiq has not been helpful and she feels it has given her anxiety and made her feel jittery. Since starting the medication she has also noted intermittent periods of time where she does not remember things. Notices this when she is watching TV or when she is church. Sometimes feels like she is losing time. Lasts for a few seconds at a time. This occurs a couple of times per week.   Brain MRI was suggestive of sinusitis and was otherwise unremarkable.   Prior Therapies                                  Lyrica 75 mg QHS - fatigue Gabapentin 100 mg QHS Amitriptyline 10 mg QHS Cymbalta 20 mg daily Pristiq 25 mg daily - anxiety, jitteriness Lisinopril Back injections  Physical Exam:   Vital Signs: BP 133/81   Pulse 81   Ht 5' 3" (1.6 m)   Wt 174 lb 8 oz (79.2 kg)   BMI 30.91 kg/m  GENERAL:  well appearing, in no acute distress, alert  SKIN:  Color, texture, turgor normal. No rashes or lesions HEAD:  Normocephalic/atraumatic. RESP: normal respiratory effort MSK:  No gross joint deformities.   NEUROLOGICAL: Mental Status:    05/12/2022    9:24 AM  Montreal Cognitive Assessment   Visuospatial/ Executive (0/5) 1  Naming (0/3) 3  Attention: Read list of digits (0/2) 2  Attention: Read list of letters (0/1) 1  Attention: Serial 7 subtraction starting at 100 (0/3)  2  Language: Repeat phrase (0/2) 2  Language : Fluency (0/1) 1  Abstraction (0/2) 2  Delayed Recall (0/5) 4  Orientation (0/6) 6  Total 24  Adjusted Score (based on education) 24   Cranial Nerves: PERRL, face symmetric, no dysarthria, hearing grossly intact Motor: moves all extremities equally Gait: normal-based.  IMPRESSION: 88 year old female with a history of cervical and lumbar spondylosis, HTN, endometrial cancer, diabetes who presents for follow up of headaches. Suspect headaches are more cervicogenic as she did not have improvement with occipital nerve block. Will stop Pristiq did not help and has caused side effects. Will monitor to see if her episodes of losing time improve once she stops the medication. If episodes of losing time continue, would consider EEG. She is not interested in starting a new medication at this time. Will refer to neck PT for headaches and cervicalgia.  PLAN: -Stop Pristiq -Referral for neck PT -Next steps: consider PRN baclofen, zonisamide, referral to ortho for neck injections. Consider EEG if episodes of losing time persist after stopping Pristiq  Follow-up: 6 months with me  I spent a total of 55 minutes on the date of the service. Headache education was done. Discussed treatment options including preventive and acute medications, and physical therapy.    Discussed medication side effects, adverse reactions and drug interactions. Written educational materials and patient instructions outlining all of the above were given.  Genia Harold, MD 05/12/22 9:48 AM

## 2022-05-12 NOTE — Patient Instructions (Addendum)
Stop desvenlafaxine. Let me know if you continue to have episodes where you lose periods of time Referral for physical therapy for the neck  Natural supplements that can reduce headaches: Magnesium Oxide or Magnesium Glycinate 500 mg at bed (up to 800 mg daily) Coenzyme Q10 300 mg in AM Vitamin B2- 200 mg twice a day  Add 1 supplement at a time since even natural supplements can have undesirable side effects. You can sometimes buy supplements cheaper (especially Coenzyme Q10) at [email or web address] or at [employer].

## 2022-05-14 ENCOUNTER — Encounter: Payer: Self-pay | Admitting: Family Medicine

## 2022-05-14 ENCOUNTER — Ambulatory Visit (INDEPENDENT_AMBULATORY_CARE_PROVIDER_SITE_OTHER): Payer: Medicare Other | Admitting: Family Medicine

## 2022-05-14 VITALS — BP 106/50 | HR 76 | Temp 98.2°F | Ht 63.0 in | Wt 173.5 lb

## 2022-05-14 DIAGNOSIS — K5904 Chronic idiopathic constipation: Secondary | ICD-10-CM | POA: Diagnosis not present

## 2022-05-14 DIAGNOSIS — M5030 Other cervical disc degeneration, unspecified cervical region: Secondary | ICD-10-CM | POA: Diagnosis not present

## 2022-05-14 DIAGNOSIS — M65331 Trigger finger, right middle finger: Secondary | ICD-10-CM | POA: Diagnosis not present

## 2022-05-14 DIAGNOSIS — J320 Chronic maxillary sinusitis: Secondary | ICD-10-CM | POA: Diagnosis not present

## 2022-05-14 DIAGNOSIS — K219 Gastro-esophageal reflux disease without esophagitis: Secondary | ICD-10-CM | POA: Diagnosis not present

## 2022-05-14 DIAGNOSIS — M65341 Trigger finger, right ring finger: Secondary | ICD-10-CM | POA: Diagnosis not present

## 2022-05-14 DIAGNOSIS — I10 Essential (primary) hypertension: Secondary | ICD-10-CM

## 2022-05-14 DIAGNOSIS — R7303 Prediabetes: Secondary | ICD-10-CM | POA: Diagnosis not present

## 2022-05-14 DIAGNOSIS — N1832 Chronic kidney disease, stage 3b: Secondary | ICD-10-CM

## 2022-05-14 LAB — BAYER DCA HB A1C WAIVED: HB A1C (BAYER DCA - WAIVED): 5.7 % — ABNORMAL HIGH (ref 4.8–5.6)

## 2022-05-14 NOTE — Progress Notes (Signed)
Established Patient Office Visit  Subjective   Patient ID: Susan Haney, female    DOB: 04-Aug-1931  Age: 88 y.o. MRN: [id number]  Chief Complaint  Patient presents with   Medical Management of Chronic Issues   Hypertension    Hypertension   HTN Complaint with meds - Yes Current Medications - lisinopril-HCTZ Checking BP at home: 120s/70s Exercising Regularly - walking daily Pertinent ROS:  Headache - chronic headaches due to neck pain, managed by neurology Fatigue - No Visual Disturbances - No Chest pain - No Dyspnea - No Palpitations - No LE edema - mild, baseline  2. Prediabetes Last a1c was 5.5. She is on farxiga for CKD. Her fasting blood sugars are <110. She sees podiatry.   3.GERD/constipation Sees GI. She is on prilosec for GERD. She is on metamucil and amitiza for constipation. Denies nausea, vomiting, blood in stool, rectal bleeding, dysphagia.   4. Hand pain  She has a history of bilateral carpal tunnel. She has had surgery on the right in the past without significant improvement in symptoms. She was planning to have the left wrist done as well until Covid happened. She doesn't wish to have the done now since her right still bothers her. She reports tingling in left hand, mostly at night. She has pain in her right hand as well. For the last month or so she has had to use her left hand to open the middle and right finger of her right hand as these fingers get stuck when she holds her cane. The top knuckle of the first finger of her right hand also clicks when she opens it.   5. Pristiq She was stated on pristiq 25 mg by her previous PCP. She stopped this due to side effects.      05/14/2022    8:05 AM 04/05/2022    9:47 AM 02/10/2022    9:59 AM  Depression screen PHQ 2/9  Decreased Interest 0 0 0  Down, Depressed, Hopeless 0 0 0  PHQ - 2 Score 0 0 0  Altered sleeping 3 3 0  Tired, decreased energy 0 3 0  Change in appetite 0 0 0  Feeling bad or failure  about yourself  0 0 0  Trouble concentrating 0 0 0  Moving slowly or fidgety/restless 0 0 0  Suicidal thoughts 0 0 0  PHQ-9 Score 3 6 0  Difficult doing work/chores Not difficult at all Not difficult at all Not difficult at all      05/14/2022    8:07 AM 02/10/2022   10:00 AM 11/03/2021    9:24 AM 09/08/2021   10:29 AM  GAD 7 : Generalized Anxiety Score  Nervous, Anxious, on Edge 0 0 0 0  Control/stop worrying 0 0 0 0  Worry too much - different things 0 0 0 0  Trouble relaxing 0 0 0 0  Restless 0 0 0 0  Easily annoyed or irritable 0 0 0 0  Afraid - awful might happen 0 0 0 0  Total GAD 7 Score 0 0 0 0  Anxiety Difficulty Not difficult at all Not difficult at all Not difficult at all Not difficult at all     Past Medical History:  Diagnosis Date   Anemia    history ,  took iron for years   Arthritis    Bicuspid aortic valve    Cancer ([place]) 2017   endometrial cancer / cervical   Cellulitis 12/31/2017   BLE, ankles  Chronic back pain    DDD (degenerative disc disease), lumbar    Endometrial cancer ([facility])    GERD (gastroesophageal reflux disease)    History of radiation therapy 06/15/16-07/06/16   vaginal cuff 30 Gy in 5 fractions   Hypertension    Prediabetes    Spondylosis    lumbar      ROS As per HPI.    Objective:     BP (!) 106/50   Pulse 76   Temp 98.2 F (36.8 C) (Temporal)   Ht _0  (1.6 m)   Wt 173 lb 8 oz (78.7 kg)   SpO2 96%   BMI 30.73 kg/m  BP Readings from Last 3 Encounters:  05/14/22 (!) 106/50  05/12/22 133/81  04/05/22 117/74      Physical Exam Vitals and nursing note reviewed.  Constitutional:      General: She is not in acute distress.    Appearance: She is not ill-appearing, toxic-appearing or diaphoretic.  HENT:     Head: Normocephalic and atraumatic.     Mouth/Throat:     Mouth: Mucous membranes are moist.     Pharynx: Oropharynx is clear.  Eyes:     General:        Right eye: No discharge.        Left eye: No  discharge.     Pupils: Pupils are equal, round, and reactive to light.  Cardiovascular:     Rate and Rhythm: Normal rate and regular rhythm.     Heart sounds: Normal heart sounds. No murmur heard. Pulmonary:     Effort: Pulmonary effort is normal. No respiratory distress.     Breath sounds: Normal breath sounds.  Abdominal:     General: Bowel sounds are normal. There is no distension.     Palpations: Abdomen is soft.     Tenderness: There is no abdominal tenderness. There is no guarding or rebound.  Musculoskeletal:     Cervical back: No rigidity.     Right lower leg: 1+ Edema present.     Left lower leg: 1+ Edema present.     Comments: Clicking noted of right first finger DIP joint. Tenderness at base of middle and ring finger of right hand. No swelling or erythema.   Skin:    General: Skin is warm and dry.  Neurological:     General: No focal deficit present.     Mental Status: She is alert and oriented to person, place, and time.  Psychiatric:        Mood and Affect: Mood normal.        Behavior: Behavior normal.      No results found for any visits on 05/14/22.    The ASCVD Risk score (Arnett DK, et al., 2019) failed to calculate for the following reasons:   The 2019 ASCVD risk score is only valid for ages 72 to 12    Assessment & Plan:   Susan Haney was seen today for medical management of chronic issues and hypertension.  Diagnoses and all orders for this visit:  Primary hypertension Well controlled on current regimen. Continue lisinopril-HCTZ. Labs pending.  -     CBC with Differential/Platelet -     Lipid panel -     Bayer DCA Hb A1c Waived -     TSH -     CMP14+EGFR  Stage 3b chronic kidney disease ([place]) Labs pending. On lisinopril and farxiga.  -     CBC with Differential/Platelet -  CMP14+EGFR  Prediabetes A1c pending. On Farxiga for CKD. Sees podiatry. Fasting blood sugars are <100. On ACE.  -     CBC with Differential/Platelet -     Lipid  panel -     Bayer DCA Hb A1c Waived -     CMP14+EGFR  Gastroesophageal reflux disease without esophagitis Well controlled on current regimen. On prilosec.  -     CBC with Differential/Platelet -     CMP14+EGFR  Chronic idiopathic constipation Well controlled on current regimen. Managed by GI.   Degenerative disc disease, cervical Managed by neurology. Referral to PT has been placed.   Trigger middle finger of right hand Trigger ring finger of right hand She will schedule with [facility] to discuss possible injections.   Chronic maxillary sinusitis Continue zyrtec and singular.   Return in about 6 months (around 11/13/2022) for chronic follow up with me, schedule trigger finger injections with [facility].   The patient indicates understanding of these issues and agrees with the plan.  Gwenlyn Perking, FNP

## 2022-05-14 NOTE — Patient Instructions (Signed)
Trigger Finger  Trigger finger, also called stenosing tenosynovitis,  is a condition that causes a finger to get stuck in a bent position. Each finger has a tendon, which is a tough, cord-like tissue that connects muscle to bone, and each tendon passes through a tunnel of tissue called a tendon sheath. To move your finger, your tendon needs to glide freely through the sheath. Trigger finger happens when the tendon or the sheath thickens, making it difficult to move your finger. Trigger finger can affect any finger or a thumb. It may affect more than one finger. Mild cases may clear up with rest and medicine. Severe cases require more treatment. What are the causes? Trigger finger is caused by a thickened finger tendon or tendon sheath. The cause of this thickening is not known. What increases the risk? The following factors may make you more likely to develop this condition: Doing activities that require a strong grip. Having rheumatoid arthritis, gout, or diabetes. Being 40-60 years old. Being female. What are the signs or symptoms? Symptoms of this condition include: Pain when bending or straightening your finger. Tenderness or swelling where your finger attaches to the palm of your hand. A lump in the palm of your hand or on the inside of your finger. Hearing a noise like a pop or a snap when you try to straighten your finger. Feeling a catching or locking sensation when you try to straighten your finger. Being unable to straighten your finger. How is this diagnosed? This condition is diagnosed based on your symptoms and a physical exam. How is this treated? This condition may be treated by: Resting your finger and avoiding activities that make symptoms worse. Wearing a finger splint to keep your finger extended. Taking NSAIDs, such as ibuprofen, to relieve pain and swelling. Doing gentle exercises to stretch the finger as told by your health care provider. Having medicine that reduces  swelling and inflammation (steroids) injected into the tendon sheath. Injections may need to be repeated. Having surgery to open the tendon sheath. This may be done if other treatments do not work and you cannot straighten your finger. You may need physical therapy after surgery. Follow these instructions at home: If you have a splint: Wear the splint as told by your health care provider. Remove it only as told by your health care provider. Loosen it if your fingers tingle, become numb, or turn cold and blue. Keep it clean. If the splint is not waterproof: Do not let it get wet. Cover it with a watertight covering when you take a bath or shower. Managing pain, stiffness, and swelling     If directed, apply heat to the affected area as often as told by your health care provider. Use the heat source that your health care provider recommends, such as a moist heat pack or a heating pad. Place a towel between your skin and the heat source. Leave the heat on for 20-30 minutes. Remove the heat if your skin turns bright red. This is especially important if you are unable to feel pain, heat, or cold. You may have a greater risk of getting burned. If directed, put ice on the painful area. To do this: If you have a removable splint, remove it as told by your health care provider. Put ice in a plastic bag. Place a towel between your skin and the bag or between your splint and the bag. Leave the ice on for 20 minutes, 2-3 times a day.  Activity Rest   your finger as told by your health care provider. Avoid activities that make the pain worse. Return to your normal activities as told by your health care provider. Ask your health care provider what activities are safe for you. Do exercises as told by your health care provider. Ask your health care provider when it is safe to drive if you have a splint on your hand. General instructions Take over-the-counter and prescription medicines only as told by  your health care provider. Keep all follow-up visits as told by your health care provider. This is important. Contact a health care provider if: Your symptoms are not improving with home care. Summary Trigger finger, also called stenosing tenosynovitis, causes your finger to get stuck in a bent position. This can make it difficult and painful to straighten your finger. This condition develops when a finger tendon or tendon sheath thickens. Treatment may include resting your finger, wearing a splint, and taking medicines. In severe cases, surgery to open the tendon sheath may be needed. This information is not intended to replace advice given to you by your health care provider. Make sure you discuss any questions you have with your health care provider. Document Revised: 11/20/2018 Document Reviewed: 11/20/2018 Elsevier Patient Education  2023 [employer]  

## 2022-05-15 LAB — CBC WITH DIFFERENTIAL/PLATELET
Basophils Absolute: 0 10*3/uL (ref 0.0–0.2)
Basos: 1 %
EOS (ABSOLUTE): 0.2 10*3/uL (ref 0.0–0.4)
Eos: 3 %
Hematocrit: 36.6 % (ref 34.0–46.6)
Hemoglobin: 12 g/dL (ref 11.1–15.9)
Immature Grans (Abs): 0 10*3/uL (ref 0.0–0.1)
Immature Granulocytes: 0 %
Lymphocytes Absolute: 1.7 10*3/uL (ref 0.7–3.1)
Lymphs: 30 %
MCH: 28.8 pg (ref 26.6–33.0)
MCHC: 32.8 g/dL (ref 31.5–35.7)
MCV: 88 fL (ref 79–97)
Monocytes Absolute: 0.7 10*3/uL (ref 0.1–0.9)
Monocytes: 11 %
Neutrophils Absolute: 3.2 10*3/uL (ref 1.4–7.0)
Neutrophils: 55 %
Platelets: 265 10*3/uL (ref 150–450)
RBC: 4.16 x10E6/uL (ref 3.77–5.28)
RDW: 12.7 % (ref 11.7–15.4)
WBC: 5.8 10*3/uL (ref 3.4–10.8)

## 2022-05-15 LAB — CMP14+EGFR
ALT: 13 IU/L (ref 0–32)
AST: 17 IU/L (ref 0–40)
Albumin/Globulin Ratio: 1.8 (ref 1.2–2.2)
Albumin: 3.9 g/dL (ref 3.7–4.7)
Alkaline Phosphatase: 55 IU/L (ref 44–121)
BUN/Creatinine Ratio: 19 (ref 12–28)
BUN: 22 mg/dL (ref 8–27)
Bilirubin Total: 0.2 mg/dL (ref 0.0–1.2)
CO2: 22 mmol/L (ref 20–29)
Calcium: 9.2 mg/dL (ref 8.7–10.3)
Chloride: 104 mmol/L (ref 96–106)
Creatinine, Ser: 1.17 mg/dL — ABNORMAL HIGH (ref 0.57–1.00)
Globulin, Total: 2.2 g/dL (ref 1.5–4.5)
Glucose: 76 mg/dL (ref 70–99)
Potassium: 3.7 mmol/L (ref 3.5–5.2)
Sodium: 142 mmol/L (ref 134–144)
Total Protein: 6.1 g/dL (ref 6.0–8.5)
eGFR: 45 mL/min/{1.73_m2} — ABNORMAL LOW (ref >59)

## 2022-05-15 LAB — LIPID PANEL
Chol/HDL Ratio: 3.3 ratio (ref 0.0–4.4)
Cholesterol, Total: 172 mg/dL (ref 100–199)
HDL: 52 mg/dL (ref >39)
LDL Chol Calc (NIH): 97 mg/dL (ref 0–99)
Triglycerides: 132 mg/dL (ref 0–149)
VLDL Cholesterol Cal: 23 mg/dL (ref 5–40)

## 2022-05-15 LAB — TSH: TSH: 2.34 u[IU]/mL (ref 0.450–4.500)

## 2022-05-17 ENCOUNTER — Other Ambulatory Visit: Payer: Self-pay | Admitting: Family Medicine

## 2022-05-17 DIAGNOSIS — I10 Essential (primary) hypertension: Secondary | ICD-10-CM

## 2022-05-19 ENCOUNTER — Ambulatory Visit: Payer: Medicare Other | Admitting: Family Medicine

## 2022-05-20 ENCOUNTER — Telehealth: Payer: Self-pay | Admitting: Psychiatry

## 2022-05-20 NOTE — Telephone Encounter (Signed)
Sent mychart msg informing pt of appointment change from 12/08/22 to 10/27/22 - MD out

## 2022-05-27 ENCOUNTER — Ambulatory Visit: Payer: Medicare Other

## 2022-05-29 ENCOUNTER — Encounter (INDEPENDENT_AMBULATORY_CARE_PROVIDER_SITE_OTHER): Payer: Self-pay | Admitting: Gastroenterology

## 2022-06-03 ENCOUNTER — Other Ambulatory Visit: Payer: Self-pay

## 2022-06-03 ENCOUNTER — Ambulatory Visit: Payer: Medicare Other | Attending: Psychiatry

## 2022-06-03 DIAGNOSIS — M542 Cervicalgia: Secondary | ICD-10-CM | POA: Diagnosis not present

## 2022-06-03 NOTE — Therapy (Signed)
OUTPATIENT PHYSICAL THERAPY CERVICAL EVALUATION   Patient Name: Susan Haney MRN: [id number] DOB:10/12/1931, 88 y.o., female Today's Date: 06/03/2022  END OF SESSION:  PT End of Session - 06/03/22 0902     Visit Number 1    Number of Visits 8    Date for PT Re-Evaluation 07/16/22    PT Start Time 0903    PT Stop Time 0945    PT Time Calculation (min) 42 min    Activity Tolerance Patient tolerated treatment well    Behavior During Therapy [facility] for tasks assessed/performed             Past Medical History:  Diagnosis Date   Anemia    history ,  took iron for years   Arthritis    Bicuspid aortic valve    Cancer ([place]) 2017   endometrial cancer / cervical   Cellulitis 12/31/2017   BLE, ankles   Chronic back pain    DDD (degenerative disc disease), lumbar    Endometrial cancer ([place])    GERD (gastroesophageal reflux disease)    History of radiation therapy 06/15/16-07/06/16   vaginal cuff 30 Gy in 5 fractions   Hypertension    Prediabetes    Spondylosis    lumbar   Past Surgical History:  Procedure Laterality Date   BUNIONECTOMY     CARPAL TUNNEL RELEASE Right 05/30/2018   Procedure: RIGHT CARPAL TUNNEL RELEASE;  Surgeon: Daryll Brod, MD;  Location: [place];  Service: Orthopedics;  Laterality: Right;   COLONOSCOPY N/A 04/04/2014   Procedure: COLONOSCOPY;  Surgeon: Rogene Houston, MD;  [facility];  Service: Endoscopy;  Laterality: N/A;  100   EYE SURGERY     bil cataracts and lens implants   FOOT SURGERY     Left   Ovary removed: benign     REPLACEMENT TOTAL KNEE BILATERAL     2013   ROBOTIC ASSISTED TOTAL HYSTERECTOMY WITH BILATERAL SALPINGO OOPHERECTOMY Left 04/13/2016   Procedure: XI ROBOTIC ASSISTED TOTAL HYSTERECTOMY WITH LEFT SALPINGO OOPHORECTOMY SENTINEL LYMPH NODE BIOPSY;  Surgeon: Everitt Amber, MD;  Location: [facility];  Service: Gynecology;  Laterality: Left;   TOTAL KNEE REVISION Left 02/12/2016   Procedure: LEFT TOTAL KNEE  REVISION;  Surgeon: Rod Can, MD;  Location: [facility];  Service: Orthopedics;  Laterality: Left;   Patient Active Problem List   Diagnosis Date Noted   Prediabetes 05/14/2022   Chronic maxillary sinusitis 05/14/2022   Seasonal allergies 09/14/2021   Chronic eczema 09/14/2021   Burning sensation of lower extremity 09/14/2021   Degenerative disc disease, cervical 09/14/2021   Chronic idiopathic constipation 06/17/2021   Stage 3b chronic kidney disease ([place]) 05/06/2021   Perianal irritation 01/20/2021   Essential hypertension 08/06/2019   Vitamin D deficiency 08/06/2019   Family history of prostate cancer    Family history of cancer    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 However, they have been progressively getting worse. She has difficulty getting comfortable at night and she has been waking up with a headache. She had tried injections, but these did not help any. She had a MRI, but she was told that there was nothing significant findings. She has noticed that her familiar pain only really occurs when she leans her head back. She has noticed that she has begun to have a difficulty finding and remembering certain words when she's talking.   PERTINENT HISTORY:   History of carpal tunnel, OA, hypertension, history of cancer, chronic low back pain  PAIN:  Are you having pain? Yes: NPRS scale: 5-6/10 Pain location: head Pain description: "pressure", dull ache Aggravating factors: leaning her head on anything Relieving factors: medication  PRECAUTIONS: None  WEIGHT BEARING RESTRICTIONS: No  FALLS:  Has patient fallen in last 6 months?  No falls, but she has last her balance a few times.   LIVING ENVIRONMENT: Lives with: lives alone Lives in: House/apartment Has following equipment at home: Single point cane  OCCUPATION: retired  PLOF: Independent  PATIENT GOALS: reduced pain and be able to sleep easier  NEXT MD VISIT:   OBJECTIVE:   DIAGNOSTIC FINDINGS:  02/09/22 Brain MRI IMPRESSION: 1.  No acute intracranial process. 2. Findings suggestive of chronic right maxillary and sphenoid sinusitis, with additional air-fluid level in the right sphenoid sinus, which could indicate acute on chronic sinusitis. Correlate with symptoms.  COGNITION: Overall cognitive status: Within functional limits for tasks assessed  SENSATION: Patient reports that she has some numbness in both hands. Signs and symptoms are consistent with carpal tunnel.   POSTURE: rounded shoulders, forward head, and increased thoracic kyphosis  PALPATION: TTP: bilateral upper trapezius, cervical paraspinals, suboccipitals (reproduce familiar symptoms), and C3-5 spinous processes (familiar cervical pressure)   CERVICAL ROM:   Active ROM A/PROM (deg) eval  Flexion 38  Extension 40; cervical pain (not familiar "pressure")   Right lateral flexion 32  Left lateral flexion 30  Right rotation 79; "pressure"   Left rotation 58; "pressure"    (Blank rows = not tested)  UPPER EXTREMITY ROM: WFL for activities  UPPER EXTREMITY MMT:  MMT Right eval Left eval  Shoulder flexion 4/5 4/5  Shoulder extension    Shoulder abduction    Shoulder adduction    Shoulder  extension    Shoulder internal rotation    Shoulder external rotation    Middle trapezius    Lower trapezius    Elbow flexion 4/5; pain into her right hand 4+/5  Elbow extension 4+/5; pain into 4th and 5th fingers  4/5  Wrist flexion    Wrist extension    Wrist ulnar deviation    Wrist radial deviation    Wrist pronation    Wrist supination    Grip strength 15; hand pain 20   (Blank rows = not tested)  CERVICAL SPECIAL TESTS:  Spurling's test: positive bilaterally with the right side being more painful than the left (familiar symptoms), Distraction test: Positive, and Sharp pursor's test: Negative  TODAY'S TREATMENT:  DATE:    PATIENT EDUCATION:  Education details: Plan of care, anatomy, and goals for therapy Person educated: Patient Education method: Explanation Education comprehension: verbalized understanding  HOME EXERCISE PROGRAM:   ASSESSMENT:  CLINICAL IMPRESSION: Patient is a 88 y.o. female who was seen today for physical therapy evaluation and treatment for chronic cervical pain and headaches.  She presented with moderate pain severity and irritability with active cervical rotation reproducing her familiar pressure.  She also demonstrated increased tenderness to palpation throughout her cervical musculature and the spinous processes of C3-C5.  Recommend that she continue with skilled physical therapy to address her impairments to maximize her functional mobility.  OBJECTIVE IMPAIRMENTS: decreased activity tolerance, decreased knowledge of condition, decreased ROM, decreased strength, hypomobility, impaired flexibility, impaired tone, impaired UE functional use, postural dysfunction, and pain.   ACTIVITY LIMITATIONS: sleeping  PARTICIPATION LIMITATIONS: interpersonal relationship  PERSONAL FACTORS: Age, Behavior pattern, Fitness, Past/current  experiences, Time since onset of injury/illness/exacerbation, and 3+ comorbidities: History of carpal tunnel, OA, hypertension, history of cancer, chronic low back pain  are also affecting patient's functional outcome.   REHAB POTENTIAL: Fair    CLINICAL DECISION MAKING: Evolving/moderate complexity  EVALUATION COMPLEXITY: Moderate   GOALS: Goals reviewed with patient? Yes LONG TERM GOALS: Target date: 07/01/22  Patient will be independent with her HEP. Baseline:  Goal status: INITIAL  2.  Patient will be able to complete her daily activities without her familiar pain exceeding 2/10. Baseline:  Goal status: INITIAL  3.  Patient will report being able to sleep throughout the night without being awakened by her familiar headache. Baseline:  Goal status: INITIAL  4.  Patient will be able to demonstrate at least 65 degrees of left cervical rotation. Baseline:  Goal status: INITIAL  PLAN:  PT FREQUENCY: 2x/week  PT DURATION: 4 weeks  PLANNED INTERVENTIONS: Therapeutic exercises, Therapeutic activity, Neuromuscular re-education, Patient/Family education, Self Care, Joint mobilization, Dry Needling, Electrical stimulation, Spinal mobilization, Cryotherapy, Moist heat, Traction, Manual therapy, and Re-evaluation  PLAN FOR NEXT SESSION: UBE, chin tucks, postural reeducation, manual therapy, and modalities as needed   Darlin Coco, PT 06/03/2022, 5:28 PM

## 2022-06-08 ENCOUNTER — Ambulatory Visit: Payer: Medicare Other

## 2022-06-08 DIAGNOSIS — M542 Cervicalgia: Secondary | ICD-10-CM | POA: Diagnosis not present

## 2022-06-08 NOTE — Therapy (Signed)
OUTPATIENT PHYSICAL THERAPY CERVICAL TREATMENT   Patient Name: Susan Haney MRN: [id number] DOB:04/26/1932, 88 y.o., female Today's Date: 06/08/2022  END OF SESSION:  PT End of Session - 06/08/22 0856     Visit Number 2    Number of Visits 8    Date for PT Re-Evaluation 07/16/22    PT Start Time 0900    PT Stop Time 0948    PT Time Calculation (min) 48 min    Activity Tolerance Patient tolerated treatment well    Behavior During Therapy [facility] for tasks assessed/performed             Past Medical History:  Diagnosis Date   Anemia    history ,  took iron for years   Arthritis    Bicuspid aortic valve    Cancer ([place]) 2017   endometrial cancer / cervical   Cellulitis 12/31/2017   BLE, ankles   Chronic back pain    DDD (degenerative disc disease), lumbar    Endometrial cancer ([place])    GERD (gastroesophageal reflux disease)    History of radiation therapy 06/15/16-07/06/16   vaginal cuff 30 Gy in 5 fractions   Hypertension    Prediabetes    Spondylosis    lumbar   Past Surgical History:  Procedure Laterality Date   BUNIONECTOMY     CARPAL TUNNEL RELEASE Right 05/30/2018   Procedure: RIGHT CARPAL TUNNEL RELEASE;  Surgeon: Daryll Brod, MD;  Location: [place];  Service: Orthopedics;  Laterality: Right;   COLONOSCOPY N/A 04/04/2014   Procedure: COLONOSCOPY;  Surgeon: Rogene Houston, MD;  [facility];  Service: Endoscopy;  Laterality: N/A;  100   EYE SURGERY     bil cataracts and lens implants   FOOT SURGERY     Left   Ovary removed: benign     REPLACEMENT TOTAL KNEE BILATERAL     2013   ROBOTIC ASSISTED TOTAL HYSTERECTOMY WITH BILATERAL SALPINGO OOPHERECTOMY Left 04/13/2016   Procedure: XI ROBOTIC ASSISTED TOTAL HYSTERECTOMY WITH LEFT SALPINGO OOPHORECTOMY SENTINEL LYMPH NODE BIOPSY;  Surgeon: Everitt Amber, MD;  [facility];  Service: Gynecology;  Laterality: Left;   TOTAL KNEE REVISION Left 02/12/2016   Procedure: LEFT TOTAL KNEE  REVISION;  Surgeon: Rod Can, MD;  [facility];  Service: Orthopedics;  Laterality: Left;   Patient Active Problem List   Diagnosis Date Noted   Prediabetes 05/14/2022   Chronic maxillary sinusitis 05/14/2022   Seasonal allergies 09/14/2021   Chronic eczema 09/14/2021   Burning sensation of lower extremity 09/14/2021   Degenerative disc disease, cervical 09/14/2021   Chronic idiopathic constipation 06/17/2021   Stage 3b chronic kidney disease ([place]) 05/06/2021   Perianal irritation 01/20/2021   Essential hypertension 08/06/2019   Vitamin D deficiency 08/06/2019   Family history of prostate cancer    Family history of cancer    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   PERTINENT HISTORY:  History of carpal tunnel, OA, hypertension, history of cancer, chronic low back pain  PAIN:  Are you having pain? Yes: NPRS scale: 5-6/10 Pain location: head Pain description: "pressure", dull ache Aggravating factors: leaning her head on anything Relieving factors: medication  PRECAUTIONS: None  WEIGHT BEARING RESTRICTIONS: No  FALLS:  Has patient fallen in last 6 months?  No falls, but she has last her balance a few times.   LIVING ENVIRONMENT: Lives with: lives  alone Lives in: House/apartment Has following equipment at home: Single point cane  OCCUPATION: retired  PLOF: Independent  PATIENT GOALS: reduced pain and be able to sleep easier  NEXT MD VISIT:   OBJECTIVE: all objective assessments were performed at her initial evaluation on 06/03/22 unless otherwise noted  DIAGNOSTIC FINDINGS:  02/09/22 Brain MRI IMPRESSION: 1.  No acute intracranial process. 2. Findings suggestive of chronic right maxillary and sphenoid sinusitis, with additional air-fluid level in the right sphenoid sinus, which could indicate acute on chronic sinusitis. Correlate with symptoms.  COGNITION: Overall cognitive status: Within functional limits for tasks assessed  SENSATION: Patient reports that she has some numbness in both hands. Signs and symptoms are consistent with carpal tunnel.   POSTURE: rounded shoulders, forward head, and increased thoracic kyphosis  PALPATION: TTP: bilateral upper trapezius, cervical paraspinals, suboccipitals (reproduce familiar symptoms), and C3-5 spinous processes (familiar cervical pressure)   CERVICAL ROM:   Active ROM A/PROM (deg) eval  Flexion 38  Extension 40; cervical pain (not familiar "pressure")   Right lateral flexion 32  Left lateral flexion 30  Right rotation 79; "pressure"   Left rotation 58; "pressure"    (Blank rows = not tested)  UPPER EXTREMITY ROM: WFL for activities assessed  UPPER EXTREMITY MMT:  MMT Right eval Left eval  Shoulder flexion 4/5 4/5  Shoulder extension    Shoulder abduction    Shoulder adduction    Shoulder extension    Shoulder internal rotation    Shoulder external rotation    Middle trapezius    Lower trapezius    Elbow flexion 4/5; pain into her right hand 4+/5  Elbow extension 4+/5; pain into 4th and 5th fingers  4/5  Wrist flexion    Wrist extension    Wrist ulnar deviation    Wrist radial deviation    Wrist pronation    Wrist supination    Grip strength 15;  hand pain 20   (Blank rows = not tested)  CERVICAL SPECIAL TESTS:  Spurling's test: positive bilaterally with the right side being more painful than the left (familiar symptoms), Distraction test: Positive, and Sharp pursor's test: Negative  TODAY'S TREATMENT:                                                                                                                              DATE:  11/21 EXERCISE LOG  Exercise Repetitions and Resistance Comments  UBE  X8 minutes @ 120 RPM   Resisted shoulder extension Green t-band x 2 minutes   R upper trapezius stretch 3 x 30 seconds            Blank cell = exercise not performed today  Manual Therapy Soft Tissue Mobilization: right upper trapezius, levator scapulae, supraspinatus , for reduced pain and tone   Modalities: no redness or adverse reaction to today's modalities  Date:  Unattended Estim: right upper trapezius, pre mod @ 80-150 Hz, 10 mins, Pain and Tone  PATIENT EDUCATION:  Education details: Plan of care, anatomy, and goals for therapy Person educated: Patient Education method: Explanation Education comprehension: verbalized understanding  HOME EXERCISE PROGRAM:   ASSESSMENT:  CLINICAL IMPRESSION: Patient was introduced to multiple new interventions for reduced pain and tone. She required minimal cueing with today's new interventions for proper exercise performance and pacing. Manual therapy focused on soft tissue mobilization to her right upper trapezius and surrounding musculature as this was able to moderately reduce her familiar symptoms. She reported feeling better upon the conclusion of treatment. She continues to require skilled physical therapy to address her remaining impairments to maximize her functional mobility.   OBJECTIVE IMPAIRMENTS: decreased activity tolerance, decreased knowledge of condition, decreased ROM, decreased strength, hypomobility, impaired flexibility,  impaired tone, impaired UE functional use, postural dysfunction, and pain.   ACTIVITY LIMITATIONS: sleeping  PARTICIPATION LIMITATIONS: interpersonal relationship  PERSONAL FACTORS: Age, Behavior pattern, Fitness, Past/current experiences, Time since onset of injury/illness/exacerbation, and 3+ comorbidities: History of carpal tunnel, OA, hypertension, history of cancer, chronic low back pain  are also affecting patient's functional outcome.   REHAB POTENTIAL: Fair    CLINICAL DECISION MAKING: Evolving/moderate complexity  EVALUATION COMPLEXITY: Moderate   GOALS: Goals reviewed with patient? Yes LONG TERM GOALS: Target date: 07/01/22  Patient will be independent with her HEP. Baseline:  Goal status: INITIAL  2.  Patient will be able to complete her daily activities without her familiar pain exceeding 2/10. Baseline:  Goal status: INITIAL  3.  Patient will report being able to sleep throughout the night without being awakened by her familiar headache. Baseline:  Goal status: INITIAL  4.  Patient will be able to demonstrate at least 65 degrees of left cervical rotation. Baseline:  Goal status: INITIAL  PLAN:  PT FREQUENCY: 2x/week  PT DURATION: 4 weeks  PLANNED INTERVENTIONS: Therapeutic exercises, Therapeutic activity, Neuromuscular re-education, Patient/Family education, Self Care, Joint mobilization, Dry Needling, Electrical stimulation, Spinal mobilization, Cryotherapy, Moist heat, Traction, Manual therapy, and Re-evaluation  PLAN FOR NEXT SESSION: UBE, chin tucks, postural reeducation, manual therapy, and modalities as needed   Darlin Coco, PT 06/08/2022, 2:02 PM

## 2022-06-09 ENCOUNTER — Other Ambulatory Visit: Payer: Self-pay | Admitting: Family Medicine

## 2022-06-09 DIAGNOSIS — L309 Dermatitis, unspecified: Secondary | ICD-10-CM

## 2022-06-09 DIAGNOSIS — E119 Type 2 diabetes mellitus without complications: Secondary | ICD-10-CM

## 2022-06-15 ENCOUNTER — Encounter: Payer: Self-pay | Admitting: Physical Therapy

## 2022-06-15 ENCOUNTER — Ambulatory Visit: Payer: Medicare Other | Admitting: Physical Therapy

## 2022-06-15 DIAGNOSIS — M542 Cervicalgia: Secondary | ICD-10-CM | POA: Diagnosis not present

## 2022-06-15 NOTE — Therapy (Signed)
OUTPATIENT PHYSICAL THERAPY CERVICAL TREATMENT   Patient Name: ROSANA FARNELL MRN: [id number] DOB:1932/01/09, 88 y.o., female Today's Date: 06/15/2022  END OF SESSION:  PT End of Session - 06/15/22 0859     Visit Number 3    Number of Visits 8    Date for PT Re-Evaluation 07/16/22    PT Start Time 0902    PT Stop Time 0945    PT Time Calculation (min) 43 min    Activity Tolerance Patient tolerated treatment well    Behavior During Therapy [facility] for tasks assessed/performed             Past Medical History:  Diagnosis Date   Anemia    history ,  took iron for years   Arthritis    Bicuspid aortic valve    Cancer ([place]) 2017   endometrial cancer / cervical   Cellulitis 12/31/2017   BLE, ankles   Chronic back pain    DDD (degenerative disc disease), lumbar    Endometrial cancer ([place])    GERD (gastroesophageal reflux disease)    History of radiation therapy 06/15/16-07/06/16   vaginal cuff 30 Gy in 5 fractions   Hypertension    Prediabetes    Spondylosis    lumbar   Past Surgical History:  Procedure Laterality Date   BUNIONECTOMY     CARPAL TUNNEL RELEASE Right 05/30/2018   Procedure: RIGHT CARPAL TUNNEL RELEASE;  Surgeon: Daryll Brod, MD;  Location: [place];  Service: Orthopedics;  Laterality: Right;   COLONOSCOPY N/A 04/04/2014   Procedure: COLONOSCOPY;  Surgeon: Rogene Houston, MD;  [facility];  Service: Endoscopy;  Laterality: N/A;  100   EYE SURGERY     bil cataracts and lens implants   FOOT SURGERY     Left   Ovary removed: benign     REPLACEMENT TOTAL KNEE BILATERAL     2013   ROBOTIC ASSISTED TOTAL HYSTERECTOMY WITH BILATERAL SALPINGO OOPHERECTOMY Left 04/13/2016   Procedure: XI ROBOTIC ASSISTED TOTAL HYSTERECTOMY WITH LEFT SALPINGO OOPHORECTOMY SENTINEL LYMPH NODE BIOPSY;  Surgeon: Everitt Amber, MD;  [facility];  Service: Gynecology;  Laterality: Left;   TOTAL KNEE REVISION Left 02/12/2016   Procedure: LEFT TOTAL KNEE  REVISION;  Surgeon: Rod Can, MD;  [facility];  Service: Orthopedics;  Laterality: Left;   Patient Active Problem List   Diagnosis Date Noted   Prediabetes 05/14/2022   Chronic maxillary sinusitis 05/14/2022   Seasonal allergies 09/14/2021   Chronic eczema 09/14/2021   Burning sensation of lower extremity 09/14/2021   Degenerative disc disease, cervical 09/14/2021   Chronic idiopathic constipation 06/17/2021   Stage 3b chronic kidney disease ([place]) 05/06/2021   Perianal irritation 01/20/2021   Essential hypertension 08/06/2019   Vitamin D deficiency 08/06/2019   Family history of prostate cancer    Family history of cancer    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 Has pain into the back of her head. Patient states that her RUE more weak today. Had the latest COVID booster recently and had some sharp shoulder pain afterwards but has never been aftected before.  PERTINENT HISTORY:  History of carpal tunnel, OA, hypertension, history of cancer, chronic low back pain  PAIN:  Are you having pain? Yes: NPRS scale: 3/10 Pain location: head Pain description: "pressure", dull ache Aggravating factors: leaning her head on anything Relieving factors: medication  PRECAUTIONS:  None  PATIENT GOALS: reduced pain and be able to sleep easier  NEXT MD VISIT: TBD  OBJECTIVE: all objective assessments were performed at her initial evaluation on 06/03/22 unless otherwise noted  DIAGNOSTIC FINDINGS:  02/09/22 Brain MRI IMPRESSION: 1.  No acute intracranial process. 2. Findings suggestive of chronic right maxillary and sphenoid sinusitis, with additional air-fluid level in the right sphenoid sinus, which could indicate acute on chronic sinusitis. Correlate with symptoms.  CERVICAL ROM:   Active ROM A/PROM (deg) eval  Flexion 38  Extension 40; cervical pain (not familiar "pressure")   Right lateral flexion 32  Left lateral flexion 30  Right rotation 79; "pressure"   Left rotation 58; "pressure"    (Blank rows = not tested)  UPPER EXTREMITY ROM: WFL for activities assessed  UPPER EXTREMITY MMT:  MMT Right eval Left eval  Shoulder flexion 4/5 4/5  Shoulder extension    Shoulder abduction    Shoulder adduction    Shoulder extension    Shoulder internal rotation    Shoulder external rotation    Middle trapezius    Lower trapezius    Elbow flexion 4/5; pain into her right hand 4+/5  Elbow extension 4+/5; pain into 4th and 5th fingers  4/5  Wrist flexion    Wrist extension    Wrist ulnar deviation    Wrist radial deviation    Wrist pronation    Wrist supination    Grip strength 15; hand pain 20   (Blank rows = not tested)  CERVICAL SPECIAL TESTS:  Spurling's test: positive bilaterally with the right side being more painful than the left (familiar symptoms), Distraction test: Positive, and Sharp pursor's test: Negative  TODAY'S TREATMENT:                                                                                                                              DATE:                                     11/28 EXERCISE LOG  Exercise Repetitions and Resistance Comments  UBE  X8 minutes @ 120 RPM   Scapular retraction X15 reps   Shoulder shrugs  X15 reps   UT stretch 5x10 sec        Blank cell = exercise not performed today  Manual Therapy Soft Tissue Mobilization: left upper trapezius, levator scapulae, supraspinatus , for reduced pain and tone    Modalities: no redness or adverse reaction to today's modalities  Date: 11/28 Unattended Estim: right upper trapezius, pre mod @ 80-150 Hz, 10 mins, Pain and Tone  PATIENT EDUCATION:  Education details: Plan of care, anatomy, and goals for therapy Person educated: Patient Education method: Explanation Education comprehension: verbalized understanding  HOME EXERCISE PROGRAM:   ASSESSMENT:  CLINICAL IMPRESSION: Patient presented in clinic with reports of more cervical pain today. Patient states that she is doing some weights. Patient has more limitation and pain with L cervcial musclature today. Patient especially tender to L UT area and surrounding L scapula. Patient instructed to drink more water today. Normal stimulation response noted following removal of the modality.  OBJECTIVE IMPAIRMENTS: decreased activity tolerance, decreased knowledge of condition, decreased ROM, decreased strength, hypomobility, impaired flexibility, impaired tone, impaired UE functional use, postural dysfunction, and pain.   ACTIVITY LIMITATIONS: sleeping  PARTICIPATION LIMITATIONS: interpersonal relationship  PERSONAL FACTORS: Age, Behavior pattern, Fitness, Past/current experiences, Time since onset of injury/illness/exacerbation, and 3+ comorbidities: History of carpal tunnel, OA, hypertension, history of cancer, chronic low back pain  are also affecting patient's functional outcome.   REHAB POTENTIAL: Fair    CLINICAL DECISION MAKING: Evolving/moderate complexity  EVALUATION COMPLEXITY: Moderate   GOALS: Goals reviewed with patient? Yes LONG TERM GOALS: Target date: 07/01/22  Patient will be independent with her HEP. Baseline:  Goal status: INITIAL  2.  Patient will be able to  complete her daily activities without her familiar pain exceeding 2/10. Baseline:  Goal status: INITIAL  3.  Patient will report being able to sleep throughout the night without being awakened by her familiar headache. Baseline:  Goal status: INITIAL  4.  Patient will be able to demonstrate at least 65 degrees of left cervical rotation. Baseline:  Goal status: INITIAL  PLAN:  PT FREQUENCY: 2x/week  PT DURATION: 4 weeks  PLANNED INTERVENTIONS: Therapeutic exercises, Therapeutic activity, Neuromuscular re-education, Patient/Family education, Self Care, Joint mobilization, Dry Needling, Electrical stimulation, Spinal mobilization, Cryotherapy, Moist heat, Traction, Manual therapy, and Re-evaluation  PLAN FOR NEXT SESSION: UBE, chin tucks, postural reeducation, manual therapy, and modalities as needed   Standley Brooking, PTA 06/15/2022, 10:03 AM

## 2022-06-16 ENCOUNTER — Ambulatory Visit (INDEPENDENT_AMBULATORY_CARE_PROVIDER_SITE_OTHER): Payer: Medicare Other

## 2022-06-16 VITALS — Ht 63.0 in | Wt 173.0 lb

## 2022-06-16 DIAGNOSIS — Z0000 Encounter for general adult medical examination without abnormal findings: Secondary | ICD-10-CM

## 2022-06-16 NOTE — Progress Notes (Signed)
Subjective:   Susan Haney is a 88 y.o. female who presents for Medicare Annual (Subsequent) preventive examination. I connected with  Briscoe Burns on 06/16/22 by a audio enabled telemedicine application and verified that I am speaking with the correct person using two identifiers.  Patient Location: Home  Provider Location: Home Office  I discussed the limitations of evaluation and management by telemedicine. The patient expressed understanding and agreed to proceed.  Review of Systems     Cardiac Risk Factors include: advanced age (>19mn, >>44women);hypertension     Objective:    Today's Vitals   06/16/22 0818  Weight: 173 lb (78.5 kg)  Height: '5\' 3"'$  (1.6 m)   Body mass index is 30.65 kg/m.     06/16/2022    8:23 AM 06/03/2022    5:17 PM 07/30/2021    9:43 AM 07/07/2021   11:30 AM 05/28/2021    3:47 PM 07/24/2020   10:54 AM 07/23/2019   10:48 AM  Advanced Directives  Does Patient Have a Medical Advance Directive? Yes Yes Yes Yes Yes Yes Yes  Type of AParamedicof APompeys PillarLiving will  Healthcare Power of ABrentLiving will HKeithLiving will Living will  Does patient want to make changes to medical advance directive? No - Patient declined  No - Patient declined   No - Patient declined   Copy of HLindsayin Chart? Yes - validated most recent copy scanned in chart (See row information)  Yes - validated most recent copy scanned in chart (See row information)  No - copy requested Yes - validated most recent copy scanned in chart (See row information)     Current Medications (verified) Outpatient Encounter Medications as of 06/16/2022  Medication Sig   ACCU-CHEK AVIVA PLUS test strip USE TO CHECK BLOOD SUGARS 1-2 TIMES DAILY. DX E11.9   aspirin EC 81 MG tablet Take 81 mg by mouth daily.   Biotin 1000 MCG tablet Take 1,000 mcg by mouth daily.   cetirizine (ZYRTEC) 10 MG  tablet Take 1 tablet (10 mg total) by mouth daily.   cholecalciferol (VITAMIN D) 400 UNITS TABS tablet Take 2,000 Units by mouth.   Cyanocobalamin (B-12 PO) Take by mouth.   FARXIGA 10 MG TABS tablet TAKE 1 TABLET BY MOUTH DAILY   fluticasone (FLONASE) 50 MCG/ACT nasal spray SPRAY 2 SPRAYS INTO EACH NOSTRIL EVERY DAY   lidocaine (LIDODERM) 5 % lidocaine 5 % topical patch  PLACE 1 PATCH ONTO THE SKIN DAILY. REMOVE & DISCARD PATCH WITHIN 12 HOURS OR AS DIRECTED BY MD   lisinopril-hydrochlorothiazide (ZESTORETIC) 20-25 MG tablet TAKE 1 TABLET BY MOUTH DAILY   lubiprostone (AMITIZA) 8 MCG capsule TAKE 1 CAPSULE BY MOUTH  TWICE DAILY WITH A MEAL   Magnesium Oxide (MAG-200 PO) Take by mouth.   montelukast (SINGULAIR) 10 MG tablet TAKE 1 TABLET BY MOUTH AT  BEDTIME   Multiple Vitamins-Minerals (MULTIVITAMIN WITH MINERALS) tablet Take 1 tablet by mouth daily.   nystatin-triamcinolone (MYCOLOG II) cream Apply 1 application topically 2 (two) times daily.   Omega-3 Fatty Acids (MAXEPA PO) Take 500 mg by mouth daily at 6 (six) AM.   omeprazole (PRILOSEC) 20 MG capsule Take 20 mg by mouth. Takes as needed   psyllium (METAMUCIL) 58.6 % packet Take 1 packet by mouth daily.   triamcinolone cream (KENALOG) 0.5 % APPLY TOPICALLY 3 TIMES A DAY   No facility-administered encounter medications on file  as of 06/16/2022.    Allergies (verified) Patient has no known allergies.   History: Past Medical History:  Diagnosis Date   Anemia    history ,  took iron for years   Arthritis    Bicuspid aortic valve    Cancer ([place]) 2017   endometrial cancer / cervical   Cellulitis 12/31/2017   BLE, ankles   Chronic back pain    DDD (degenerative disc disease), lumbar    Endometrial cancer ([place])    GERD (gastroesophageal reflux disease)    History of radiation therapy 06/15/16-07/06/16   vaginal cuff 30 Gy in 5 fractions   Hypertension    Prediabetes    Spondylosis    lumbar   Past Surgical History:   Procedure Laterality Date   BUNIONECTOMY     CARPAL TUNNEL RELEASE Right 05/30/2018   Procedure: RIGHT CARPAL TUNNEL RELEASE;  Surgeon: Daryll Brod, MD;  Location: [facility];  Service: Orthopedics;  Laterality: Right;   COLONOSCOPY N/A 04/04/2014   Procedure: COLONOSCOPY;  Surgeon: Rogene Houston, MD;  [facility];  Service: Endoscopy;  Laterality: N/A;  100   EYE SURGERY     bil cataracts and lens implants   FOOT SURGERY     Left   Ovary removed: benign     REPLACEMENT TOTAL KNEE BILATERAL     2013   ROBOTIC ASSISTED TOTAL HYSTERECTOMY WITH BILATERAL SALPINGO OOPHERECTOMY Left 04/13/2016   Procedure: XI ROBOTIC ASSISTED TOTAL HYSTERECTOMY WITH LEFT SALPINGO OOPHORECTOMY SENTINEL LYMPH NODE BIOPSY;  Surgeon: Everitt Amber, MD;  Location: [facility];  Service: Gynecology;  Laterality: Left;   TOTAL KNEE REVISION Left 02/12/2016   Procedure: LEFT TOTAL KNEE REVISION;  Surgeon: Rod Can, MD;  Location: [facility];  Service: Orthopedics;  Laterality: Left;   Family History  Problem Relation Age of Onset   Prostate cancer Father 64   Arthritis Brother        back issues and knee    Diabetes Maternal Grandmother    Heart attack Maternal Grandfather    Liver cancer Sister    Cancer Other 73       possibly pancreatic   Cancer Other        possibly uterine or ovarian   Heart disease Son    Diabetes Son    Arthritis Son    Hypertension Son    Hyperlipidemia Son    Social History   Socioeconomic History   Marital status: Widowed    Spouse name: Not on file   Number of children: 2   Years of education: Not on file   Highest education level: Not on file  Occupational History   Occupation: retired  Tobacco Use   Smoking status: Never    Passive exposure: Past   Smokeless tobacco: Never  Vaping Use   Vaping Use: Never used  Substance and Sexual Activity   Alcohol use: No    Alcohol/week: 0.0 standard drinks of alcohol   Drug use: No   Sexual activity:  Not Currently  Other Topics Concern   Not on file  Social History Narrative   2 sons - in [place] and [place]   Disabled sister lives close by   Social Determinants of Health   Financial Resource Strain: [place]  (06/16/2022)   Overall Financial Resource Strain (CARDIA)    Difficulty of Paying Living Expenses: Not hard at all  Food Insecurity: No Food Insecurity (06/16/2022)   Hunger Vital Sign  Worried About Charity fundraiser in the Last Year: Never true    [place] in the Last Year: Never true  Transportation Needs: No Transportation Needs (06/16/2022)   PRAPARE - Hydrologist (Medical): No    Lack of Transportation (Non-Medical): No  Physical Activity: Insufficiently Active (06/16/2022)   Exercise Vital Sign    Days of Exercise per Week: 3 days    Minutes of Exercise per Session: 30 min  Stress: No Stress Concern Present (06/16/2022)   [place]    Feeling of Stress : Not at all  Social Connections: Moderately Integrated (06/16/2022)   Social Connection and Isolation Panel [NHANES]    Frequency of Communication with Friends and Family: More than three times a week    Frequency of Social Gatherings with Friends and Family: More than three times a week    Attends Religious Services: More than 4 times per year    Active Member of [employer] or Organizations: Yes    Attends Archivist Meetings: More than 4 times per year    Marital Status: Widowed    Tobacco Counseling Counseling given: Not Answered   Clinical Intake:  Pre-visit preparation completed: Yes  Pain : No/denies pain     Nutritional Risks: None Diabetes: No  How often do you need to have someone help you when you read instructions, pamphlets, or other written materials from your doctor or pharmacy?: 1 - Never  Diabetic?no   Interpreter Needed?: No  Information entered by :: Jadene Pierini,  LPN   Activities of Daily Living    06/16/2022    8:23 AM 06/15/2022   11:51 PM  In your present state of health, do you have any difficulty performing the following activities:  Hearing? 0 0  Vision? 0 0  Difficulty concentrating or making decisions? 0 0  Walking or climbing stairs? 0 1  Dressing or bathing? 0 0  Doing errands, shopping? 0 0  Preparing Food and eating ? N N  Using the Toilet? N N  In the past six months, have you accidently leaked urine? N N  Do you have problems with loss of bowel control? N Y  Managing your Medications? N N  Managing your Finances? N N  Housekeeping or managing your Housekeeping? N N    Patient Care Team: Gwenlyn Perking, FNP as PCP - General (Family Medicine) Celestia Khat, [place] (Optometry) Blanca Friend Royce Macadamia, [facility] as [facility] (Pharmacist)  Indicate any recent Medical Services you may have received from other than [facility] providers in the past year (date may be approximate).     Assessment:   This is a routine wellness examination for Channie.  Hearing/Vision screen Vision Screening - Comments:: Wears rx glasses - up to date with routine eye exams with  Dr.johnson   Dietary issues and exercise activities discussed: Current Exercise Habits: Home exercise routine, Type of exercise: walking, Time (Minutes): 30, Frequency (Times/Week): 3, Weekly Exercise (Minutes/Week): 90, Intensity: Mild, Exercise limited by: orthopedic condition(s)   Goals Addressed             This Visit's Progress    Prevent falls   On track      Depression Screen    06/16/2022    8:22 AM 05/14/2022    8:05 AM 04/05/2022    9:47 AM 02/10/2022    9:59 AM 11/03/2021    9:23 AM  09/08/2021   10:29 AM 06/02/2021   10:58 AM  PHQ 2/9 Scores  PHQ - 2 Score 0 0 0 0 0 0 0  PHQ- 9 Score 0 3 6 0 '2 4 1    '$ Fall Risk    06/16/2022    8:20 AM 06/15/2022   11:51 PM 05/14/2022    8:05 AM 04/05/2022    9:47 AM 02/10/2022    9:59 AM  Fall  Risk   Falls in the past year? 0 0 0 0 0  Number falls in past yr: 0 0     Injury with Fall? 0 0     Risk for fall due to : No Fall Risks      Follow up Falls prevention discussed        [place]:  Any stairs in or around the home? Yes  If so, are there any without handrails? No  Home free of loose throw rugs in walkways, pet beds, electrical cords, etc? Yes  Adequate lighting in your home to reduce risk of falls? No   ASSISTIVE DEVICES UTILIZED TO PREVENT FALLS:  Life alert? No  Use of a cane, walker or w/c? Yes  Grab bars in the bathroom? Yes  Shower chair or bench in shower? Yes  Elevated toilet seat or a handicapped toilet? Yes        05/12/2022    9:24 AM  Montreal Cognitive Assessment   Visuospatial/ Executive (0/5) 1  Naming (0/3) 3  Attention: Read list of digits (0/2) 2  Attention: Read list of letters (0/1) 1  Attention: Serial 7 subtraction starting at 100 (0/3) 2  Language: Repeat phrase (0/2) 2  Language : Fluency (0/1) 1  Abstraction (0/2) 2  Delayed Recall (0/5) 4  Orientation (0/6) 6  Total 24  Adjusted Score (based on education) 24      06/16/2022    8:24 AM 05/28/2021    3:57 PM  6CIT Screen  What Year? 0 points 0 points  What month? 0 points 0 points  What time? 0 points 0 points  Count back from 20 0 points 0 points  Months in reverse 0 points 0 points  Repeat phrase 0 points 2 points  Total Score 0 points 2 points    Immunizations Immunization History  Administered Date(s) Administered   Fluad Quad(high Dose 65+) 05/06/2021   Influenza, High Dose Seasonal PF 04/23/2017, 05/09/2018, 05/09/2020   Influenza,inj,Quad PF,6+ Mos 04/23/2019   Influenza,inj,quad, With Preservative 04/18/2017   Moderna Covid-19 Vaccine Bivalent Booster 71yr & up 04/29/2021   Moderna Sars-Covid-2 Vaccination 08/01/2019, 08/29/2019, 03/18/2020, 12/31/2020   Pneumococcal Conjugate-13 07/19/2016   Pneumococcal  Polysaccharide-23 07/19/2008   Tdap 02/11/2016   Zoster Recombinat (Shingrix) 11/03/2021    TDAP status: Up to date  Flu Vaccine status: Up to date  Pneumococcal vaccine status: Up to date  Covid-19 vaccine status: Completed vaccines  Qualifies for Shingles Vaccine? Yes   Zostavax completed Yes   Shingrix Completed?: Yes  Screening Tests Health Maintenance  Topic Date Due   Zoster Vaccines- Shingrix (2 of 2) 08/14/2022 (Originally 12/29/2021)   INFLUENZA VACCINE  10/17/2022 (Originally 02/16/2022)   COVID-19 Vaccine (6 - 2023-24 season) 05/31/2023 (Originally 03/19/2022)   DEXA SCAN  05/17/2023   Medicare Annual Wellness (AWV)  06/17/2023   Pneumonia Vaccine 88 Years old  Completed   HPV VACCINES  Aged Out    Health Maintenance  There are no preventive care reminders to  display for this patient.   Colorectal cancer screening: No longer required.   Mammogram status: No longer required due to age.  Bone Density status: Completed 05/16/2020. Results reflect: Bone density results: OSTEOPENIA. Repeat every 5 years.  Lung Cancer Screening: (Low Dose CT Chest recommended if Age 36-80 years, 30 pack-year currently smoking OR have quit w/in 15years.) does not qualify.   Lung Cancer Screening Referral: n/a  Additional Screening:  Hepatitis C Screening: does not qualify;   Vision Screening: Recommended annual ophthalmology exams for early detection of glaucoma and other disorders of the eye. Is the patient up to date with their annual eye exam?  Yes  Who is the provider or what is the name of the office in which the patient attends annual eye exams? Dr.johnson  If pt is not established with a provider, would they like to be referred to a provider to establish care? No .   Dental Screening: Recommended annual dental exams for proper oral hygiene  Community Resource Referral / Chronic Care Management: CRR required this visit?  No   CCM required this visit?  No      Plan:      I have personally reviewed and noted the following in the patient's chart:   Medical and social history Use of alcohol, tobacco or illicit drugs  Current medications and supplements including opioid prescriptions. Patient is not currently taking opioid prescriptions. Functional ability and status Nutritional status Physical activity Advanced directives List of other physicians Hospitalizations, surgeries, and ER visits in previous 12 months Vitals Screenings to include cognitive, depression, and falls Referrals and appointments  In addition, I have reviewed and discussed with patient certain preventive protocols, quality metrics, and best practice recommendations. A written personalized care plan for preventive services as well as general preventive health recommendations were provided to patient.     Daphane Shepherd, LPN   51/04/2110   Nurse Notes: none

## 2022-06-16 NOTE — Patient Instructions (Signed)
[name] , Thank you for taking time to come for your Medicare Wellness Visit. I appreciate your ongoing commitment to your health goals. Please review the following plan we discussed and let me know if I can assist you in the future.   These are the goals we discussed:  Goals      Prevent falls        This is a list of the screening recommended for you and due dates:  Health Maintenance  Topic Date Due   Zoster (Shingles) Vaccine (2 of 2) 08/14/2022*   Flu Shot  10/17/2022*   COVID-19 Vaccine (6 - 2023-24 season) 05/31/2023*   DEXA scan (bone density measurement)  05/17/2023   Medicare Annual Wellness Visit  06/17/2023   Pneumonia Vaccine  Completed   HPV Vaccine  Aged Out  *Topic was postponed. The date shown is not the original due date.    Advanced directives: in Chart   Conditions/risks identified: Aim for 30 minutes of exercise or brisk walking, 6-8 glasses of water, and 5 servings of fruits and vegetables each day.   Next appointment: Follow up in one year for your annual wellness visit    Preventive Care 65 Years and Older, Female Preventive care refers to lifestyle choices and visits with your health care provider that can promote health and wellness. What does preventive care include? A yearly physical exam. This is also called an annual well check. Dental exams once or twice a year. Routine eye exams. Ask your health care provider how often you should have your eyes checked. Personal lifestyle choices, including: Daily care of your teeth and gums. Regular physical activity. Eating a healthy diet. Avoiding tobacco and drug use. Limiting alcohol use. Practicing safe sex. Taking low-dose aspirin every day. Taking vitamin and mineral supplements as recommended by your health care provider. What happens during an annual well check? The services and screenings done by your health care provider during your annual well check will depend on your age, overall health,  lifestyle risk factors, and family history of disease. Counseling  Your health care provider may ask you questions about your: Alcohol use. Tobacco use. Drug use. Emotional well-being. Home and relationship well-being. Sexual activity. Eating habits. History of falls. Memory and ability to understand (cognition). Work and work Statistician. Reproductive health. Screening  You may have the following tests or measurements: Height, weight, and BMI. Blood pressure. Lipid and cholesterol levels. These may be checked every 5 years, or more frequently if you are over 27 years old. Skin check. Lung cancer screening. You may have this screening every year starting at age 30 if you have a 30-pack-year history of smoking and currently smoke or have quit within the past 15 years. Fecal occult blood test (FOBT) of the stool. You may have this test every year starting at age 60. Flexible sigmoidoscopy or colonoscopy. You may have a sigmoidoscopy every 5 years or a colonoscopy every 10 years starting at age 39. Hepatitis C blood test. Hepatitis B blood test. Sexually transmitted disease (STD) testing. Diabetes screening. This is done by checking your blood sugar (glucose) after you have not eaten for a while (fasting). You may have this done every 1-3 years. Bone density scan. This is done to screen for osteoporosis. You may have this done starting at age 35. Mammogram. This may be done every 1-2 years. Talk to your health care provider about how often you should have regular mammograms. Talk with your health care provider about your  test results, treatment options, and if necessary, the need for more tests. Vaccines  Your health care provider may recommend certain vaccines, such as: Influenza vaccine. This is recommended every year. Tetanus, diphtheria, and acellular pertussis (Tdap, Td) vaccine. You may need a Td booster every 10 years. Zoster vaccine. You may need this after age  25. Pneumococcal 13-valent conjugate (PCV13) vaccine. One dose is recommended after age 66. Pneumococcal polysaccharide (PPSV23) vaccine. One dose is recommended after age 19. Talk to your health care provider about which screenings and vaccines you need and how often you need them. This information is not intended to replace advice given to you by your health care provider. Make sure you discuss any questions you have with your health care provider. Document Released: 08/01/2015 Document Revised: 03/24/2016 Document Reviewed: 05/06/2015 Elsevier Interactive Patient Education  2017 [place] Prevention in the Home Falls can cause injuries. They can happen to people of all ages. There are many things you can do to make your home safe and to help prevent falls. What can I do on the outside of my home? Regularly fix the edges of walkways and driveways and fix any cracks. Remove anything that might make you trip as you walk through a door, such as a raised step or threshold. Trim any bushes or trees on the path to your home. Use bright outdoor lighting. Clear any walking paths of anything that might make someone trip, such as rocks or tools. Regularly check to see if handrails are loose or broken. Make sure that both sides of any steps have handrails. Any raised decks and porches should have guardrails on the edges. Have any leaves, snow, or ice cleared regularly. Use sand or salt on walking paths during winter. Clean up any spills in your garage right away. This includes oil or grease spills. What can I do in the bathroom? Use night lights. Install grab bars by the toilet and in the tub and shower. Do not use towel bars as grab bars. Use non-skid mats or decals in the tub or shower. If you need to sit down in the shower, use a plastic, non-slip stool. Keep the floor dry. Clean up any water that spills on the floor as soon as it happens. Remove soap buildup in the tub or shower  regularly. Attach bath mats securely with double-sided non-slip rug tape. Do not have throw rugs and other things on the floor that can make you trip. What can I do in the bedroom? Use night lights. Make sure that you have a light by your bed that is easy to reach. Do not use any sheets or blankets that are too big for your bed. They should not hang down onto the floor. Have a firm chair that has side arms. You can use this for support while you get dressed. Do not have throw rugs and other things on the floor that can make you trip. What can I do in the kitchen? Clean up any spills right away. Avoid walking on wet floors. Keep items that you use a lot in easy-to-reach places. If you need to reach something above you, use a strong step stool that has a grab bar. Keep electrical cords out of the way. Do not use floor polish or wax that makes floors slippery. If you must use wax, use non-skid floor wax. Do not have throw rugs and other things on the floor that can make you trip. What can I do with my  stairs? Do not leave any items on the stairs. Make sure that there are handrails on both sides of the stairs and use them. Fix handrails that are broken or loose. Make sure that handrails are as long as the stairways. Check any carpeting to make sure that it is firmly attached to the stairs. Fix any carpet that is loose or worn. Avoid having throw rugs at the top or bottom of the stairs. If you do have throw rugs, attach them to the floor with carpet tape. Make sure that you have a light switch at the top of the stairs and the bottom of the stairs. If you do not have them, ask someone to add them for you. What else can I do to help prevent falls? Wear shoes that: Do not have high heels. Have rubber bottoms. Are comfortable and fit you well. Are closed at the toe. Do not wear sandals. If you use a stepladder: Make sure that it is fully opened. Do not climb a closed stepladder. Make sure that  both sides of the stepladder are locked into place. Ask someone to hold it for you, if possible. Clearly mark and make sure that you can see: Any grab bars or handrails. First and last steps. Where the edge of each step is. Use tools that help you move around (mobility aids) if they are needed. These include: Canes. Walkers. Scooters. Crutches. Turn on the lights when you go into a dark area. Replace any light bulbs as soon as they burn out. Set up your furniture so you have a clear path. Avoid moving your furniture around. If any of your floors are uneven, fix them. If there are any pets around you, be aware of where they are. Review your medicines with your doctor. Some medicines can make you feel dizzy. This can increase your chance of falling. Ask your doctor what other things that you can do to help prevent falls. This information is not intended to replace advice given to you by your health care provider. Make sure you discuss any questions you have with your health care provider. Document Released: 05/01/2009 Document Revised: 12/11/2015 Document Reviewed: 08/09/2014 Elsevier Interactive Patient Education  2017 [employer].

## 2022-06-17 ENCOUNTER — Ambulatory Visit: Payer: Medicare Other

## 2022-06-17 DIAGNOSIS — M542 Cervicalgia: Secondary | ICD-10-CM | POA: Diagnosis not present

## 2022-06-17 NOTE — Therapy (Signed)
OUTPATIENT PHYSICAL THERAPY CERVICAL TREATMENT   Patient Name: Susan Haney MRN: [id number] DOB:08-10-1931, 88 y.o., female Today's Date: 06/17/2022  END OF SESSION:  PT End of Session - 06/17/22 1433     Visit Number 4    Number of Visits 8    Date for PT Re-Evaluation 07/16/22    PT Start Time 1430    PT Stop Time 9371    PT Time Calculation (min) 45 min    Activity Tolerance Patient tolerated treatment well    Behavior During Therapy [facility] for tasks assessed/performed             Past Medical History:  Diagnosis Date   Anemia    history ,  took iron for years   Arthritis    Bicuspid aortic valve    Cancer ([place]) 2017   endometrial cancer / cervical   Cellulitis 12/31/2017   BLE, ankles   Chronic back pain    DDD (degenerative disc disease), lumbar    Endometrial cancer ([place])    GERD (gastroesophageal reflux disease)    History of radiation therapy 06/15/16-07/06/16   vaginal cuff 30 Gy in 5 fractions   Hypertension    Prediabetes    Spondylosis    lumbar   Past Surgical History:  Procedure Laterality Date   BUNIONECTOMY     CARPAL TUNNEL RELEASE Right 05/30/2018   Procedure: RIGHT CARPAL TUNNEL RELEASE;  Surgeon: Daryll Brod, MD;  Location: [place];  Service: Orthopedics;  Laterality: Right;   COLONOSCOPY N/A 04/04/2014   Procedure: COLONOSCOPY;  Surgeon: Rogene Houston, MD;  [facility];  Service: Endoscopy;  Laterality: N/A;  100   EYE SURGERY     bil cataracts and lens implants   FOOT SURGERY     Left   Ovary removed: benign     REPLACEMENT TOTAL KNEE BILATERAL     2013   ROBOTIC ASSISTED TOTAL HYSTERECTOMY WITH BILATERAL SALPINGO OOPHERECTOMY Left 04/13/2016   Procedure: XI ROBOTIC ASSISTED TOTAL HYSTERECTOMY WITH LEFT SALPINGO OOPHORECTOMY SENTINEL LYMPH NODE BIOPSY;  Surgeon: Everitt Amber, MD;  [facility];  Service: Gynecology;  Laterality: Left;   TOTAL KNEE REVISION Left 02/12/2016   Procedure: LEFT TOTAL KNEE  REVISION;  Surgeon: Rod Can, MD;  [facility];  Service: Orthopedics;  Laterality: Left;   Patient Active Problem List   Diagnosis Date Noted   Prediabetes 05/14/2022   Chronic maxillary sinusitis 05/14/2022   Seasonal allergies 09/14/2021   Chronic eczema 09/14/2021   Burning sensation of lower extremity 09/14/2021   Degenerative disc disease, cervical 09/14/2021   Chronic idiopathic constipation 06/17/2021   Stage 3b chronic kidney disease ([place]) 05/06/2021   Perianal irritation 01/20/2021   Essential hypertension 08/06/2019   Vitamin D deficiency 08/06/2019   Family history of prostate cancer    Family history of cancer    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 10/20/2016   Endometrial cancer ([place]) 04/13/2016   Gastroesophageal reflux disease without esophagitis 06/30/2015   Chronic lower back pain 06/18/2014   History of prediabetes 02/21/2014   PCP: Gwenlyn Perking, FNP  REFERRING PROVIDER: Genia Harold, MD   REFERRING DIAG: Cervicalgia   THERAPY DIAG:  Cervicalgia  Rationale for Evaluation and Treatment: Rehabilitation  ONSET DATE: about 3 years ago  SUBJECTIVE:  SUBJECTIVE STATEMENT: Patient reports that she feels tired today. She notes that she feels about the same as she did prior to therapy.   PERTINENT HISTORY:  History of carpal tunnel, OA, hypertension, history of cancer, chronic low back pain  PAIN:  Are you having pain? Yes: NPRS scale: 4-5/10 Pain location: head Pain description: "pressure", dull ache Aggravating factors: leaning her head on anything Relieving factors: medication  PRECAUTIONS: None  PATIENT GOALS: reduced pain and be able to sleep easier  NEXT MD VISIT: TBD  OBJECTIVE: all objective assessments were  performed at her initial evaluation on 06/03/22 unless otherwise noted  DIAGNOSTIC FINDINGS:  02/09/22 Brain MRI IMPRESSION: 1.  No acute intracranial process. 2. Findings suggestive of chronic right maxillary and sphenoid sinusitis, with additional air-fluid level in the right sphenoid sinus, which could indicate acute on chronic sinusitis. Correlate with symptoms.  CERVICAL ROM:   Active ROM A/PROM (deg) eval  Flexion 38  Extension 40; cervical pain (not familiar "pressure")   Right lateral flexion 32  Left lateral flexion 30  Right rotation 79; "pressure"   Left rotation 58; "pressure"    (Blank rows = not tested)  UPPER EXTREMITY ROM: WFL for activities assessed  UPPER EXTREMITY MMT:  MMT Right eval Left eval  Shoulder flexion 4/5 4/5  Shoulder extension    Shoulder abduction    Shoulder adduction    Shoulder extension    Shoulder internal rotation    Shoulder external rotation    Middle trapezius    Lower trapezius    Elbow flexion 4/5; pain into her right hand 4+/5  Elbow extension 4+/5; pain into 4th and 5th fingers  4/5  Wrist flexion    Wrist extension    Wrist ulnar deviation    Wrist radial deviation    Wrist pronation    Wrist supination    Grip strength 15; hand pain 20   (Blank rows = not tested)  CERVICAL SPECIAL TESTS:  Spurling's test: positive bilaterally with the right side being more painful than the left (familiar symptoms), Distraction test: Positive, and Sharp pursor's test: Negative  TODAY'S TREATMENT:                                                                                                                              DATE:   Manual Therapy Soft Tissue Mobilization: left scapular stabilizers, levator scapulae, and upper trapezius, for reduced pain and tone Joint Mobilizations: cervical and thoracic, grade I-II for reduced pain   Modalities  Date:  Unattended Estim: left scapular stabilizers, pre mod @ 80-150 Hz, 15 mins, Pain  and Tone Hot Pack: Cervical, 15 mins, Pain and Tone                                   11/28 EXERCISE LOG  Exercise Repetitions and Resistance Comments  UBE  X8 minutes @  120 RPM   Scapular retraction X15 reps   Shoulder shrugs X15 reps   UT stretch 5x10 sec        Blank cell = exercise not performed today   Manual Therapy Soft Tissue Mobilization: left upper trapezius, levator scapulae, supraspinatus , for reduced pain and tone    Modalities: no redness or adverse reaction to today's modalities  Date: 11/28 Unattended Estim: right upper trapezius, pre mod @ 80-150 Hz, 10 mins, Pain and Tone  PATIENT EDUCATION:  Education details: Plan of care, anatomy, and goals for therapy Person educated: Patient Education method: Explanation Education comprehension: verbalized understanding  HOME EXERCISE PROGRAM:   ASSESSMENT:  CLINICAL IMPRESSION: Treatment focused on manual therapy for reduced pain and tone with moderate effectiveness. Soft tissue mobilization to the left scapular stabilizers was the most effective at reducing her familiar symptoms. She reported that she felt a little better upon the conclusion of treatment. She continues to require skilled physical therapy to address her remaining impairments to return to her prior level of function.   OBJECTIVE IMPAIRMENTS: decreased activity tolerance, decreased knowledge of condition, decreased ROM, decreased strength, hypomobility, impaired flexibility, impaired tone, impaired UE functional use, postural dysfunction, and pain.   ACTIVITY LIMITATIONS: sleeping  PARTICIPATION LIMITATIONS: interpersonal relationship  PERSONAL FACTORS: Age, Behavior pattern, Fitness, Past/current experiences, Time since onset of injury/illness/exacerbation, and 3+ comorbidities: History of carpal tunnel, OA, hypertension, history of cancer, chronic low back pain  are also affecting patient's functional outcome.   REHAB POTENTIAL: Fair    CLINICAL  DECISION MAKING: Evolving/moderate complexity  EVALUATION COMPLEXITY: Moderate   GOALS: Goals reviewed with patient? Yes LONG TERM GOALS: Target date: 07/01/22  Patient will be independent with her HEP. Baseline:  Goal status: INITIAL  2.  Patient will be able to complete her daily activities without her familiar pain exceeding 2/10. Baseline:  Goal status: INITIAL  3.  Patient will report being able to sleep throughout the night without being awakened by her familiar headache. Baseline:  Goal status: INITIAL  4.  Patient will be able to demonstrate at least 65 degrees of left cervical rotation. Baseline:  Goal status: INITIAL  PLAN:  PT FREQUENCY: 2x/week  PT DURATION: 4 weeks  PLANNED INTERVENTIONS: Therapeutic exercises, Therapeutic activity, Neuromuscular re-education, Patient/Family education, Self Care, Joint mobilization, Dry Needling, Electrical stimulation, Spinal mobilization, Cryotherapy, Moist heat, Traction, Manual therapy, and Re-evaluation  PLAN FOR NEXT SESSION: UBE, chin tucks, postural reeducation, manual therapy, and modalities as needed   Darlin Coco, PT 06/17/2022, 3:37 PM

## 2022-06-22 ENCOUNTER — Ambulatory Visit: Payer: Medicare Other | Admitting: *Deleted

## 2022-06-24 ENCOUNTER — Ambulatory Visit: Payer: Medicare Other | Attending: Psychiatry

## 2022-06-24 DIAGNOSIS — M542 Cervicalgia: Secondary | ICD-10-CM | POA: Insufficient documentation

## 2022-06-24 NOTE — Therapy (Signed)
OUTPATIENT PHYSICAL THERAPY CERVICAL TREATMENT   Patient Name: Susan Haney MRN: [id number] DOB:Mar 27, 1932, 88 y.o., female Today's Date: 06/24/2022  END OF SESSION:  PT End of Session - 06/24/22 0908     Visit Number 5    Number of Visits 8    Date for PT Re-Evaluation 07/16/22    PT Start Time 0900    Activity Tolerance Patient tolerated treatment well    Behavior During Therapy [facility] for tasks assessed/performed             Past Medical History:  Diagnosis Date   Anemia    history ,  took iron for years   Arthritis    Bicuspid aortic valve    Cancer ([place]) 2017   endometrial cancer / cervical   Cellulitis 12/31/2017   BLE, ankles   Chronic back pain    DDD (degenerative disc disease), lumbar    Endometrial cancer ([place])    GERD (gastroesophageal reflux disease)    History of radiation therapy 06/15/16-07/06/16   vaginal cuff 30 Gy in 5 fractions   Hypertension    Prediabetes    Spondylosis    lumbar   Past Surgical History:  Procedure Laterality Date   BUNIONECTOMY     CARPAL TUNNEL RELEASE Right 05/30/2018   Procedure: RIGHT CARPAL TUNNEL RELEASE;  Surgeon: Daryll Brod, MD;  Location: [place];  Service: Orthopedics;  Laterality: Right;   COLONOSCOPY N/A 04/04/2014   Procedure: COLONOSCOPY;  Surgeon: Rogene Houston, MD;  [facility];  Service: Endoscopy;  Laterality: N/A;  100   EYE SURGERY     bil cataracts and lens implants   FOOT SURGERY     Left   Ovary removed: benign     REPLACEMENT TOTAL KNEE BILATERAL     2013   ROBOTIC ASSISTED TOTAL HYSTERECTOMY WITH BILATERAL SALPINGO OOPHERECTOMY Left 04/13/2016   Procedure: XI ROBOTIC ASSISTED TOTAL HYSTERECTOMY WITH LEFT SALPINGO OOPHORECTOMY SENTINEL LYMPH NODE BIOPSY;  Surgeon: Everitt Amber, MD;  [facility];  Service: Gynecology;  Laterality: Left;   TOTAL KNEE REVISION Left 02/12/2016   Procedure: LEFT TOTAL KNEE REVISION;  Surgeon: Rod Can, MD;  [facility];   Service: Orthopedics;  Laterality: Left;   Patient Active Problem List   Diagnosis Date Noted   Prediabetes 05/14/2022   Chronic maxillary sinusitis 05/14/2022   Seasonal allergies 09/14/2021   Chronic eczema 09/14/2021   Burning sensation of lower extremity 09/14/2021   Degenerative disc disease, cervical 09/14/2021   Chronic idiopathic constipation 06/17/2021   Stage 3b chronic kidney disease ([place]) 05/06/2021   Perianal irritation 01/20/2021   Essential hypertension 08/06/2019   Vitamin D deficiency 08/06/2019   Family history of prostate cancer    Family history of cancer    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  PERTINENT HISTORY:  History of carpal tunnel, OA, hypertension, history of cancer, chronic low back pain  PAIN:  Are you having pain? Yes: NPRS scale: 4/10 Pain location: head and neck Pain description: "pressure", dull ache Aggravating factors: leaning her head on anything Relieving factors: medication  PRECAUTIONS: None  PATIENT GOALS: reduced pain and be able to sleep easier  NEXT MD VISIT: TBD  OBJECTIVE: all objective assessments were performed at her initial evaluation on 06/03/22 unless otherwise noted  DIAGNOSTIC FINDINGS:   02/09/22 Brain MRI IMPRESSION: 1.  No acute intracranial process. 2. Findings suggestive of chronic right maxillary and sphenoid sinusitis, with additional air-fluid level in the right sphenoid sinus, which could indicate acute on chronic sinusitis. Correlate with symptoms.  CERVICAL ROM:   Active ROM A/PROM (deg) eval  Flexion 38  Extension 40; cervical pain (not familiar "pressure")   Right lateral flexion 32  Left lateral flexion 30  Right rotation 79; "pressure"   Left rotation 58; "pressure"    (Blank rows = not tested)  UPPER EXTREMITY ROM: WFL for activities assessed  UPPER EXTREMITY MMT:  MMT Right eval Left eval  Shoulder flexion 4/5 4/5  Shoulder extension    Shoulder abduction    Shoulder adduction    Shoulder extension    Shoulder internal rotation    Shoulder external rotation    Middle trapezius    Lower trapezius    Elbow flexion 4/5; pain into her right hand 4+/5  Elbow extension 4+/5; pain into 4th and 5th fingers  4/5  Wrist flexion    Wrist extension    Wrist ulnar deviation    Wrist radial deviation    Wrist pronation    Wrist supination    Grip strength 15; hand pain 20   (Blank rows = not tested)  CERVICAL SPECIAL TESTS:  Spurling's test: positive bilaterally with the right side being more painful than the left (familiar symptoms), Distraction test: Positive, and Sharp pursor's test: Negative  TODAY'S TREATMENT:                                                                                                                              DATE:                                     12/7 EXERCISE LOG  Exercise Repetitions and Resistance Comments  UBE  X8 minutes @ 120 RPM   Scapular retraction X15 reps   Shoulder shrugs X15 reps   UT stretch 5x10 sec   Shoulder rolls Forward/backward x 10 reps each   Rows Yellow t-band x15 reps   Extensions Yellow t-band x15 reps   Horizontal Abduction Yellow t-band x15 reps    Blank cell = exercise not  performed today   Modalities: no  redness or adverse reaction to today's modalities  Date: 12/7 Unattended Estim: right upper trapezius, pre mod @ 80-150 Hz, 10 mins, Pain and Tone  PATIENT EDUCATION:  Education details: Plan of care, anatomy, and goals for therapy Person educated: Patient Education method: Explanation Education comprehension: verbalized understanding  HOME EXERCISE PROGRAM:   ASSESSMENT:  CLINICAL IMPRESSION: Pt arrives for today's treatment session reporting 4/10 neck and upper trap pain.  Pt reports that her neck pain has not changed since beginning therapy.  Pt requested to not perform STW today, due to increased soreness post treatments involving manual therapy.  Pt able to tolerate introduction to seated tband exercises with min cues required for proper technique and posture.  Normal responses to estim and MH noted upon removal. Pt reported decreased pain at completion of today's treatment session.   OBJECTIVE IMPAIRMENTS: decreased activity tolerance, decreased knowledge of condition, decreased ROM, decreased strength, hypomobility, impaired flexibility, impaired tone, impaired UE functional use, postural dysfunction, and pain.   ACTIVITY LIMITATIONS: sleeping  PARTICIPATION LIMITATIONS: interpersonal relationship  PERSONAL FACTORS: Age, Behavior pattern, Fitness, Past/current experiences, Time since onset of injury/illness/exacerbation, and 3+ comorbidities: History of carpal tunnel, OA, hypertension, history of cancer, chronic low back pain  are also affecting patient's functional outcome.   REHAB POTENTIAL: Fair    CLINICAL DECISION MAKING: Evolving/moderate complexity  EVALUATION COMPLEXITY: Moderate   GOALS: Goals reviewed with patient? Yes LONG TERM GOALS: Target date: 07/01/22  Patient will be independent with her HEP. Baseline:  Goal status: INITIAL  2.  Patient will be able to complete her daily activities without her familiar pain exceeding  2/10. Baseline:  Goal status: INITIAL  3.  Patient will report being able to sleep throughout the night without being awakened by her familiar headache. Baseline:  Goal status: INITIAL  4.  Patient will be able to demonstrate at least 65 degrees of left cervical rotation. Baseline:  Goal status: INITIAL  PLAN:  PT FREQUENCY: 2x/week  PT DURATION: 4 weeks  PLANNED INTERVENTIONS: Therapeutic exercises, Therapeutic activity, Neuromuscular re-education, Patient/Family education, Self Care, Joint mobilization, Dry Needling, Electrical stimulation, Spinal mobilization, Cryotherapy, Moist heat, Traction, Manual therapy, and Re-evaluation  PLAN FOR NEXT SESSION: UBE, chin tucks, postural reeducation, manual therapy, and modalities as needed   Kathrynn Ducking, PTA 06/24/2022, 9:09 AM

## 2022-06-29 ENCOUNTER — Ambulatory Visit: Payer: Medicare Other | Admitting: Physical Therapy

## 2022-06-29 DIAGNOSIS — M542 Cervicalgia: Secondary | ICD-10-CM

## 2022-06-29 NOTE — Therapy (Signed)
OUTPATIENT PHYSICAL THERAPY CERVICAL TREATMENT   Patient Name: Susan Haney MRN: [id number] DOB:[birth year], 88 y.o., female Today's Date: 06/29/2022  END OF SESSION:  PT End of Session - 06/29/22 0940     Visit Number 6    Number of Visits 8    Date for PT Re-Evaluation 07/16/22    PT Start Time 0900    Activity Tolerance Patient tolerated treatment well    Behavior During Therapy [name] for tasks assessed/performed             Past Medical History:  Diagnosis Date   Anemia    history ,  took iron for years   Arthritis    Bicuspid aortic valve    Cancer ([place]) 2017   endometrial cancer / cervical   Cellulitis 12/31/2017   BLE, ankles   Chronic back pain    DDD (degenerative disc disease), lumbar    Endometrial cancer ([place])    GERD (gastroesophageal reflux disease)    History of radiation therapy 06/15/16-07/06/16   vaginal cuff 30 Gy in 5 fractions   Hypertension    Prediabetes    Spondylosis    lumbar   Past Surgical History:  Procedure Laterality Date   BUNIONECTOMY     CARPAL TUNNEL RELEASE Right 05/30/2018   Procedure: RIGHT CARPAL TUNNEL RELEASE;  Surgeon: Daryll Brod, MD;  Location: [place];  Service: Orthopedics;  Laterality: Right;   COLONOSCOPY N/A 04/04/2014   Procedure: COLONOSCOPY;  Surgeon: Rogene Houston, MD;  [facility];  Service: Endoscopy;  Laterality: N/A;  100   EYE SURGERY     bil cataracts and lens implants   FOOT SURGERY     Left   Ovary removed: benign     REPLACEMENT TOTAL KNEE BILATERAL     2013   ROBOTIC ASSISTED TOTAL HYSTERECTOMY WITH BILATERAL SALPINGO OOPHERECTOMY Left 04/13/2016   Procedure: XI ROBOTIC ASSISTED TOTAL HYSTERECTOMY WITH LEFT SALPINGO OOPHORECTOMY SENTINEL LYMPH NODE BIOPSY;  Surgeon: Everitt Amber, MD;  [facility];  Service: Gynecology;  Laterality: Left;   TOTAL KNEE REVISION Left 02/12/2016   Procedure: LEFT TOTAL KNEE REVISION;  Surgeon: Rod Can, MD;  [facility];   Service: Orthopedics;  Laterality: Left;   Patient Active Problem List   Diagnosis Date Noted   Prediabetes 05/14/2022   Chronic maxillary sinusitis 05/14/2022   Seasonal allergies 09/14/2021   Chronic eczema 09/14/2021   Burning sensation of lower extremity 09/14/2021   Degenerative disc disease, cervical 09/14/2021   Chronic idiopathic constipation 06/17/2021   Stage 3b chronic kidney disease ([place]) 05/06/2021   Perianal irritation 01/20/2021   Essential hypertension 08/06/2019   Vitamin D deficiency 08/06/2019   Family history of prostate cancer    Family history of cancer    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 PERTINENT HISTORY:  History of carpal tunnel, OA, hypertension, history of cancer, chronic low back pain  PAIN:  Are you having pain? Yes: NPRS scale: 4/10 Pain location: head and neck Pain description: "pressure", dull ache Aggravating factors: leaning her head on anything Relieving factors: medication  PRECAUTIONS: None  PATIENT GOALS: reduced pain and be able to sleep easier  NEXT MD VISIT: TBD  OBJECTIVE:    TODAY'S TREATMENT:                                                                                                                              DATE:                                      12/7 EXERCISE LOG  Exercise Repetitions and Resistance Comments  Nustep Level 4 x 10 minutes.                               Manual:  In supine:  STW/M x 13 minutes to patient's bilateral upper cervical/suboccipital region f/b HMP and IFC at 80-150 Hz on 40% scan x 20 minutes.    ASSESSMENT:  CLINICAL IMPRESSION: Patient enjoyed soft tissue work especially to her suboccipital region.  We discussed dry needling and a consent form was provided.  Patient stated feeling better after treatment.    HEP:   HOME EXERCISE PROGRAM Created by [place] Ammon Muscatello Dec 12th, 2023 View at [email or web address] using code: [email or web address]  Page 1 of 1  2 Exercises RETRACTION / CHIN TUCK Slowly draw your head back so that your ears line up with your shoulders. Repeat 10 Times Hold 5 Seconds Complete 1 Set Perform 6 Times a Day CERVICAL EXTENSION Tilt your head upwards, then return back to looking straight ahead. Repeat 10 Times Hold 5 Seconds Complete 1 Set Perform 6 Times a Day    GOALS: Goals reviewed with patient? Yes LONG TERM GOALS: Target date: 07/01/22  Patient will be independent with her HEP. Baseline:  Goal status: INITIAL  2.  Patient will be able to complete her daily activities without her familiar pain exceeding 2/10. Baseline:  Goal status: INITIAL  3.  Patient will report being able to sleep throughout the night without being awakened by her familiar headache. Baseline:  Goal status: INITIAL  4.  Patient will be able to demonstrate at least 65 degrees of left cervical rotation. Baseline:  Goal status: INITIAL  PLAN:  PT FREQUENCY: 2x/week  PT DURATION: 4 weeks  PLANNED INTERVENTIONS: Therapeutic exercises, Therapeutic activity, Neuromuscular re-education, Patient/Family education, Self Care, Joint mobilization, Dry Needling, Electrical stimulation, Spinal mobilization, Cryotherapy, Moist heat, Traction, Manual therapy, and  Re-evaluation  PLAN FOR NEXT SESSION: UBE, chin tucks, postural reeducation, manual therapy, and modalities as needed   Loys [place]elton, [place], PT  06/29/2022, 10:13 AM

## 2022-07-01 ENCOUNTER — Ambulatory Visit: Payer: Medicare Other | Admitting: Physical Therapy

## 2022-07-01 ENCOUNTER — Encounter: Payer: Self-pay | Admitting: Physical Therapy

## 2022-07-01 DIAGNOSIS — M542 Cervicalgia: Secondary | ICD-10-CM

## 2022-07-01 NOTE — Therapy (Signed)
OUTPATIENT PHYSICAL THERAPY CERVICAL TREATMENT   Patient Name: Susan Haney MRN: [id number] DOB:Nov 03, 1931, 88 y.o., female Today's Date: 07/01/2022  END OF SESSION:  PT End of Session - 07/01/22 0902     Visit Number 7    Number of Visits 8    Date for PT Re-Evaluation 07/16/22    PT Start Time 0908    PT Stop Time 0949    PT Time Calculation (min) 41 min    Activity Tolerance Patient tolerated treatment well    Behavior During Therapy [facility] for tasks assessed/performed            Past Medical History:  Diagnosis Date   Anemia    history ,  took iron for years   Arthritis    Bicuspid aortic valve    Cancer ([place]) 2017   endometrial cancer / cervical   Cellulitis 12/31/2017   BLE, ankles   Chronic back pain    DDD (degenerative disc disease), lumbar    Endometrial cancer ([place])    GERD (gastroesophageal reflux disease)    History of radiation therapy 06/15/16-07/06/16   vaginal cuff 30 Gy in 5 fractions   Hypertension    Prediabetes    Spondylosis    lumbar   Past Surgical History:  Procedure Laterality Date   BUNIONECTOMY     CARPAL TUNNEL RELEASE Right 05/30/2018   Procedure: RIGHT CARPAL TUNNEL RELEASE;  Surgeon: Daryll Brod, MD;  Location: [place];  Service: Orthopedics;  Laterality: Right;   COLONOSCOPY N/A 04/04/2014   Procedure: COLONOSCOPY;  Surgeon: Rogene Houston, MD;  [facility];  Service: Endoscopy;  Laterality: N/A;  100   EYE SURGERY     bil cataracts and lens implants   FOOT SURGERY     Left   Ovary removed: benign     REPLACEMENT TOTAL KNEE BILATERAL     2013   ROBOTIC ASSISTED TOTAL HYSTERECTOMY WITH BILATERAL SALPINGO OOPHERECTOMY Left 04/13/2016   Procedure: XI ROBOTIC ASSISTED TOTAL HYSTERECTOMY WITH LEFT SALPINGO OOPHORECTOMY SENTINEL LYMPH NODE BIOPSY;  Surgeon: Everitt Amber, MD;  Location[facility];  Service: Gynecology;  Laterality: Left;   TOTAL KNEE REVISION Left 02/12/2016   Procedure: LEFT TOTAL KNEE  REVISION;  Surgeon: Rod Can, MD;  [facility];  Service: Orthopedics;  Laterality: Left;   Patient Active Problem List   Diagnosis Date Noted   Prediabetes 05/14/2022   Chronic maxillary sinusitis 05/14/2022   Seasonal allergies 09/14/2021   Chronic eczema 09/14/2021   Burning sensation of lower extremity 09/14/2021   Degenerative disc disease, cervical 09/14/2021   Chronic idiopathic constipation 06/17/2021   Stage 3b chronic kidney disease ([place]) 05/06/2021   Perianal irritation 01/20/2021   Essential hypertension 08/06/2019   Vitamin D deficiency 08/06/2019   Family history of prostate cancer    Family history of cancer    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 Marland Kitchen PERTINENT HISTORY:  History of carpal tunnel, OA, hypertension, history of cancer, chronic low back pain  PAIN:  Are you having pain? Yes: NPRS scale: 4/10 Pain location: head and neck Pain description: "pressure", dull ache Aggravating factors: leaning her head on anything Relieving factors: medication  PRECAUTIONS: None  PATIENT GOALS: reduced pain and be able to sleep easier  NEXT MD VISIT: TBD  OBJECTIVE:    TODAY'S TREATMENT:                                                                                                                               DATE:    Modalities  Date:  Unattended Estim: Cervical, Pre-Mod, 15 mins, Pain Hot Pack: Cervical, 15 mins, Pain  Manual Therapy Soft Tissue Mobilization: B suboccipitals release/ cervical paraspinals/ UT, light pressure to reduce pain Manual Traction: Cervical, light traction to assess response   **patient reported pressure at the back of her head   ASSESSMENT:  CLINICAL IMPRESSION: Patient presented in clinic with reports of continued pain and headaches even after the STW session at previous session. Patient had pain come on at around 3-4 pm after last PT session. Patient taken through light pressure STW for suboccipitals, cervical paraspinals, UT. Patient reporting tenderness intermittently through manual therapy. Light traction completed in supine to assess response. Initially patient reported pressure at the back of her head. Less pull had no complaints.  HEP:   HOME EXERCISE PROGRAM Created by [place]i Applegate Dec 12th, 2023 View at [email or web address] using code: [email or web address]  Page 1 of 1  2 Exercises RETRACTION / CHIN TUCK Slowly draw your head back so that your ears line up with your shoulders. Repeat 10 Times Hold 5 Seconds Complete 1 Set Perform 6 Times a Day CERVICAL EXTENSION Tilt your head upwards, then return back to looking straight ahead. Repeat 10 Times Hold 5 Seconds Complete 1 Set Perform 6 Times a Day    GOALS: Goals reviewed with patient? Yes LONG TERM GOALS: Target date: 07/01/22  Patient will be independent with her HEP. Baseline:  Goal status: INITIAL  2.  Patient will be able to complete her daily activities without her familiar pain exceeding 2/10. Baseline:  Goal status: INITIAL  3.  Patient will report being able to sleep throughout the night without being awakened by her familiar headache. Baseline:  Goal status: INITIAL  4.  Patient  will be able to demonstrate at least 65 degrees of left cervical rotation. Baseline:  Goal status: INITIAL  PLAN:  PT FREQUENCY: 2x/week  PT DURATION: 4 weeks  PLANNED INTERVENTIONS: Therapeutic exercises, Therapeutic activity, Neuromuscular re-education, Patient/Family education, Self Care, Joint mobilization, Dry Needling, Electrical stimulation, Spinal mobilization, Cryotherapy, Moist heat, Traction, Manual therapy, and Re-evaluation  PLAN FOR NEXT SESSION: Assess response; DC  Elizebeth Koller Manus Weedman, PTA 07/01/2022, 10:38 AM

## 2022-07-06 ENCOUNTER — Ambulatory Visit: Payer: Medicare Other | Admitting: Physical Therapy

## 2022-07-06 ENCOUNTER — Encounter: Payer: Self-pay | Admitting: Physical Therapy

## 2022-07-06 DIAGNOSIS — M542 Cervicalgia: Secondary | ICD-10-CM

## 2022-07-06 NOTE — Therapy (Addendum)
OUTPATIENT PHYSICAL THERAPY CERVICAL TREATMENT   Patient Name: Susan Haney MRN: [id number] DOB:25-Jan-1932, 88 y.o., female Today's Date: 07/06/2022  END OF SESSION:  PT End of Session - 07/06/22 0907     Visit Number 8    Number of Visits 8    Date for PT Re-Evaluation 07/16/22    PT Start Time 0855    Activity Tolerance Patient tolerated treatment well    Behavior During Therapy [facility] for tasks assessed/performed            Past Medical History:  Diagnosis Date   Anemia    history ,  took iron for years   Arthritis    Bicuspid aortic valve    Cancer ([place]) 2017   endometrial cancer / cervical   Cellulitis 12/31/2017   BLE, ankles   Chronic back pain    DDD (degenerative disc disease), lumbar    Endometrial cancer ([place])    GERD (gastroesophageal reflux disease)    History of radiation therapy 06/15/16-07/06/16   vaginal cuff 30 Gy in 5 fractions   Hypertension    Prediabetes    Spondylosis    lumbar   Past Surgical History:  Procedure Laterality Date   BUNIONECTOMY     CARPAL TUNNEL RELEASE Right 05/30/2018   Procedure: RIGHT CARPAL TUNNEL RELEASE;  Surgeon: Daryll Brod, MD;  Location: [facility];  Service: Orthopedics;  Laterality: Right;   COLONOSCOPY N/A 04/04/2014   Procedure: COLONOSCOPY;  Surgeon: Rogene Houston, MD;  [facility];  Service: Endoscopy;  Laterality: N/A;  100   EYE SURGERY     bil cataracts and lens implants   FOOT SURGERY     Left   Ovary removed: benign     REPLACEMENT TOTAL KNEE BILATERAL     2013   ROBOTIC ASSISTED TOTAL HYSTERECTOMY WITH BILATERAL SALPINGO OOPHERECTOMY Left 04/13/2016   Procedure: XI ROBOTIC ASSISTED TOTAL HYSTERECTOMY WITH LEFT SALPINGO OOPHORECTOMY SENTINEL LYMPH NODE BIOPSY;  Surgeon: Everitt Amber, MD;  [facility];  Service: Gynecology;  Laterality: Left;   TOTAL KNEE REVISION Left 02/12/2016   Procedure: LEFT TOTAL KNEE REVISION;  Surgeon: Rod Can, MD;  [facility];   Service: Orthopedics;  Laterality: Left;   Patient Active Problem List   Diagnosis Date Noted   Prediabetes 05/14/2022   Chronic maxillary sinusitis 05/14/2022   Seasonal allergies 09/14/2021   Chronic eczema 09/14/2021   Burning sensation of lower extremity 09/14/2021   Degenerative disc disease, cervical 09/14/2021   Chronic idiopathic constipation 06/17/2021   Stage 3b chronic kidney disease ([place]) 05/06/2021   Perianal irritation 01/20/2021   Essential hypertension 08/06/2019   Vitamin D deficiency 08/06/2019   Family history of prostate cancer    Family history of cancer    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 Having daily headaches. No difference but possibly a little worse after minimal cervical traction which she states is normal for PT days anyways. Marland Kitchen PERTINENT HISTORY:  History of carpal tunnel, OA, hypertension, history of cancer, chronic low back pain  PAIN:  Are you having pain? Yes: NPRS scale: 4-5/10 Pain location: head and neck Pain description: "pressure", dull ache Aggravating factors: leaning her head on anything Relieving factors: medication  PRECAUTIONS: None  PATIENT GOALS: reduced pain and be able to sleep easier  NEXT MD VISIT: TBD  OBJECTIVE:   CERVICAL ROM:    Active ROM A/PROM (deg)   Flexion   Extension   Right lateral flexion   Left lateral flexion   Right rotation 60  Left rotation 51   (Blank rows = not tested)   *more pull in central cervical spine with R cervical rotation  TODAY'S TREATMENT:                                                                                                                              DATE:  07/06/22                                    EXERCISE LOG  Exercise Repetitions and Resistance Comments  Nustep  L3 x17 min                    Blank cell = exercise not performed today   Manual Therapy Soft Tissue Mobilization: B cervical paraspinals/ R UT, to reduce pain and muscle tightness    Modalities  Date: 07/06/22 Unattended Estim: Cervical, Pre-Mod, 15 mins, Pain and Tone  ASSESSMENT:  CLINICAL IMPRESSION: Patient presented in clinic with continued cervical pain and muscle tightness. Continued headaches also reported by patient. More tone palpable in R cervical paraspinals and UT. Lack of L cervical rotation but greater pull in central cervical spine with R cervical rotation. Tenderness reported with palpation of the R cervical paraspinals. Normal stimulation response noted following removal of the modality. Goals unmet due to pain and lack of L cervical rotation.  PHYSICAL THERAPY DISCHARGE SUMMARY  Visits from Start of Care: 8  Current functional level related to goals / functional outcomes: Patient was unable to meet her goals for skilled physical therapy.    Remaining deficits: Pain and cervical AROM   Education / Equipment: HEP    Patient agrees to discharge. Patient goals were partially met. Patient is being discharged due to lack of progress.  Candi Leash, PT, DPT    HEP:   HOME EXERCISE PROGRAM Created by [place] Applegate Dec 12th, 2023 View at [email or web address] using code: [email or web address]  Page 1 of 1  2 Exercises RETRACTION / CHIN TUCK Slowly draw your head back so  that your ears line up with your shoulders. Repeat 10 Times Hold 5 Seconds Complete  1 Set Perform 6 Times a Day CERVICAL EXTENSION Tilt your head upwards, then return back to looking straight ahead. Repeat 10 Times Hold 5 Seconds Complete 1 Set Perform 6 Times a Day  GOALS: Goals reviewed with patient? Yes LONG TERM GOALS: Target date: 07/01/22  Patient will be independent with her HEP. Baseline:  Goal status: MET  2.  Patient will be able to complete her daily activities without her familiar pain exceeding 2/10. Baseline:  Goal status: NOT MET  3.  Patient will report being able to sleep throughout the night without being awakened by her familiar headache. Baseline:  Goal status: NOT MET  4.  Patient will be able to demonstrate at least 65 degrees of left cervical rotation. Baseline:  Goal status: NOT MET  PLAN:  PT FREQUENCY: 2x/week  PT DURATION: 4 weeks  PLANNED INTERVENTIONS: Therapeutic exercises, Therapeutic activity, Neuromuscular re-education, Patient/Family education, Self Care, Joint mobilization, Dry Needling, Electrical stimulation, Spinal mobilization, Cryotherapy, Moist heat, Traction, Manual therapy, and Re-evaluation  PLAN FOR NEXT SESSION: DC  Marvell Fuller, PTA 07/06/2022, 9:39 AM

## 2022-07-14 ENCOUNTER — Other Ambulatory Visit: Payer: Self-pay | Admitting: Psychiatry

## 2022-07-26 ENCOUNTER — Other Ambulatory Visit: Payer: Self-pay | Admitting: Family Medicine

## 2022-07-26 DIAGNOSIS — N1832 Chronic kidney disease, stage 3b: Secondary | ICD-10-CM

## 2022-07-29 ENCOUNTER — Ambulatory Visit (INDEPENDENT_AMBULATORY_CARE_PROVIDER_SITE_OTHER): Payer: Medicare Other | Admitting: Gastroenterology

## 2022-09-29 ENCOUNTER — Other Ambulatory Visit: Payer: Self-pay | Admitting: Family Medicine

## 2022-09-29 DIAGNOSIS — N1832 Chronic kidney disease, stage 3b: Secondary | ICD-10-CM

## 2022-09-30 ENCOUNTER — Other Ambulatory Visit: Payer: Self-pay | Admitting: Family Medicine

## 2022-09-30 DIAGNOSIS — I10 Essential (primary) hypertension: Secondary | ICD-10-CM

## 2022-10-04 ENCOUNTER — Ambulatory Visit (INDEPENDENT_AMBULATORY_CARE_PROVIDER_SITE_OTHER): Payer: Medicare Other | Admitting: Gastroenterology

## 2022-10-04 ENCOUNTER — Encounter (INDEPENDENT_AMBULATORY_CARE_PROVIDER_SITE_OTHER): Payer: Self-pay | Admitting: Gastroenterology

## 2022-10-04 VITALS — BP 121/72 | HR 87 | Temp 97.8°F | Ht 63.0 in | Wt 171.3 lb

## 2022-10-04 DIAGNOSIS — K5904 Chronic idiopathic constipation: Secondary | ICD-10-CM | POA: Diagnosis not present

## 2022-10-04 DIAGNOSIS — K219 Gastro-esophageal reflux disease without esophagitis: Secondary | ICD-10-CM | POA: Diagnosis not present

## 2022-10-04 MED ORDER — IBSRELA 50 MG PO TABS: 50.0000 mg | ORAL_TABLET | Freq: Two times a day (BID) | ORAL | 1 refills | Status: DC

## 2022-10-04 NOTE — Patient Instructions (Signed)
Please stop amitiza Increase water intake, aim for atleast 64 oz per day Increase fruits, veggies and whole grains, kiwi and prunes are especially good for constipation I am providing samples of ibsrela 50mg  to take twice daily just prior to a meal.  I have sent a prescription for this as well, it may take a week or two to receive as it goes through a speciality pharmacy Please let me know you are doing on this medication after 2 weeks You can continue to use omeprazole as needed  Follow up 1 year  It was a pleasure to see you today. I want to create trusting relationships with patients and provide genuine, compassionate, and quality care. I truly value your feedback! please be on the lookout for a survey regarding your visit with me today. I appreciate your input about our visit and your time in completing this!    Avanish Cerullo L. Alver Sorrow, MSN, APRN, AGNP-C Adult-Gerontology Nurse Practitioner [facility] at [facility]

## 2022-10-04 NOTE — Progress Notes (Signed)
Referring Provider: Gwenlyn Perking, FNP Primary Care Physician:  Gwenlyn Perking, FNP Primary GI Physician: Jenetta Downer   Chief Complaint  Patient presents with   Constipation    Follow up on gerd and constipation. Reports amitiza not helping much. Has a little stool every day and sometimes has watery stools. Reports reflux is doing well.    HPI:   Susan Haney is a 89 y.o. female with past medical history of  anemia, arthritis, biscuspid aortic valve, DDD, GERD, HTN, prediabetes.   Patient presenting today for follow up of GERD and Constipation   Lat seen July 2023, at that time previously on 67mcg of Amitiza before covid which was too strong for her. maintained on 49mcg BID at this time. having a small amount of stool almost daily. If she goes a couple of days without a good BM, she will take two Amitiza in the morning. She will use dulcolax gummies maybe twice per month if she feels more constipated or goes more than 2 days without a good BM. BMs are more soft and formed. She has some occasional RLQ pressure at times where she feels more constipated, this improves once she is able to defecate. She does note that on occasion she has she feels that she has a little irritation of her rectum, She was previously given a cream to use by Dr. Laural Golden but she dose not have to use it often. GERD is well controlled, she is only taking her omeprazole as needed, reports she requires this only a few times per month. She tries to avoid eating late.   Recommended to continue amitiza 33mcg BID, dulcolax PRN, increase water, fiber in diet, PPI PRN, boost/ensure protein shakes 1-2x/day.  Present:  She notes she is having atleast a very small BM daily but often does not feel that this is sufficiently. A lot of times having small stool balls. If she takes two at the same time in the morning if she has not had a BM, she will have more diarrhea, maybe 2-3 episodes. She denies abdominal discomfort but has a lot  of gurgling. She notes some fecal seepage at times. She has occasional nausea if she takes two amitiza at once. Denies rectal bleeding or melena.   GERD is well managed with diet, she takes omeprazole maybe twice per month is she eats something she knows she should not have. No dysphagia, odynophagia.   Last Colonoscopy:9/17/15Examination performed to cecum. Single left-sided diverticulum and external hemorrhoids otherwise normal colonoscopy.   Past Medical History:  Diagnosis Date   Anemia    history ,  took iron for years   Arthritis    Bicuspid aortic valve    Cancer ([place]) 2017   endometrial cancer / cervical   Cellulitis 12/31/2017   BLE, ankles   Chronic back pain    DDD (degenerative disc disease), lumbar    Endometrial cancer ([place])    GERD (gastroesophageal reflux disease)    History of radiation therapy 06/15/16-07/06/16   vaginal cuff 30 Gy in 5 fractions   Hypertension    Prediabetes    Spondylosis    lumbar    Past Surgical History:  Procedure Laterality Date   BUNIONECTOMY     CARPAL TUNNEL RELEASE Right 05/30/2018   Procedure: RIGHT CARPAL TUNNEL RELEASE;  Surgeon: Daryll Brod, MD;  Location: [facility];  Service: Orthopedics;  Laterality: Right;   COLONOSCOPY N/A 04/04/2014   Procedure: COLONOSCOPY;  Surgeon: Rogene Houston, MD;  [facility];  Service: Endoscopy;  Laterality: N/A;  100   EYE SURGERY     bil cataracts and lens implants   FOOT SURGERY     Left   Ovary removed: benign     REPLACEMENT TOTAL KNEE BILATERAL     2013   ROBOTIC ASSISTED TOTAL HYSTERECTOMY WITH BILATERAL SALPINGO OOPHERECTOMY Left 04/13/2016   Procedure: XI ROBOTIC ASSISTED TOTAL HYSTERECTOMY WITH LEFT SALPINGO OOPHORECTOMY SENTINEL LYMPH NODE BIOPSY;  Surgeon: Everitt Amber, MD;  Location: [facility];  Service: Gynecology;  Laterality: Left;   TOTAL KNEE REVISION Left 02/12/2016   Procedure: LEFT TOTAL KNEE REVISION;  Surgeon: Rod Can, MD;  Location: [facility];  Service: Orthopedics;  Laterality: Left;    Current Outpatient Medications  Medication Sig Dispense Refill   ACCU-CHEK AVIVA PLUS test strip USE TO CHECK BLOOD SUGARS 1-2 TIMES DAILY. DX E11.9 100 strip 12   aspirin EC 81 MG tablet Take 81 mg by mouth daily.     Biotin 1000 MCG tablet Take 1,000 mcg by mouth daily.     cetirizine (ZYRTEC) 10 MG tablet Take 1 tablet (10 mg total) by mouth daily. 90 tablet 1   Cholecalciferol 50 MCG (2000 UT) CAPS Take 2,000 Units by mouth.     Cyanocobalamin (B-12 PO) Take by mouth.     dapagliflozin propanediol (FARXIGA) 10 MG TABS tablet TAKE 1 TABLET BY MOUTH DAILY 90 tablet 1   fluticasone (FLONASE) 50 MCG/ACT nasal spray SPRAY 2 SPRAYS INTO EACH NOSTRIL EVERY DAY 48 mL 2   lidocaine (LIDODERM) 5 % lidocaine 5 % topical patch  PLACE 1 PATCH ONTO THE SKIN DAILY. REMOVE & DISCARD PATCH WITHIN 12 HOURS OR AS DIRECTED BY MD     lisinopril-hydrochlorothiazide (ZESTORETIC) 20-25 MG tablet Take 1 tablet by mouth daily. (NEEDS TO BE SEEN BEFORE NEXT REFILL) 100 tablet 0   lubiprostone (AMITIZA) 8 MCG capsule TAKE 1 CAPSULE BY MOUTH  TWICE DAILY WITH A MEAL 180 capsule 3   Magnesium Oxide (MAG-200 PO) Take by mouth.     Multiple Vitamins-Minerals (MULTIVITAMIN WITH MINERALS) tablet Take 1 tablet by mouth daily.     nystatin-triamcinolone (MYCOLOG II) cream Apply 1 application topically 2 (two) times daily. 30 g 1   Omega-3 Fatty Acids (MAXEPA PO) Take 500 mg by mouth daily at 6 (six) AM.     omeprazole (PRILOSEC) 20 MG capsule Take 20 mg by mouth. Takes as needed     psyllium (METAMUCIL) 58.6 % packet Take 1 packet by mouth daily.     triamcinolone cream (KENALOG) 0.5 % APPLY TOPICALLY 3 TIMES A DAY 45 g 2   montelukast (SINGULAIR) 10 MG tablet TAKE 1 TABLET BY MOUTH AT  BEDTIME (Patient not taking: Reported on 10/04/2022) 100 tablet 2   No current facility-administered medications for this visit.    Allergies as of 10/04/2022   (No Known Allergies)     Family History  Problem Relation Age of Onset   Prostate cancer Father 22   Arthritis Brother        back issues and knee    Diabetes Maternal Grandmother    Heart attack Maternal Grandfather    Liver cancer Sister    Cancer Other 62       possibly pancreatic   Cancer Other        possibly uterine or ovarian   Heart disease Son    Diabetes Son    Arthritis Son    Hypertension Son  Hyperlipidemia Son     Social History   Socioeconomic History   Marital status: Widowed    Spouse name: Not on file   Number of children: 2   Years of education: Not on file   Highest education level: Not on file  Occupational History   Occupation: retired  Tobacco Use   Smoking status: Never    Passive exposure: Past   Smokeless tobacco: Never  Vaping Use   Vaping Use: Never used  Substance and Sexual Activity   Alcohol use: No    Alcohol/week: 0.0 standard drinks of alcohol   Drug use: No   Sexual activity: Not Currently  Other Topics Concern   Not on file  Social History Narrative   2 sons - in [place] and [place]   Disabled sister lives close by   Social Determinants of Health   Financial Resource Strain: [place]  (06/16/2022)   Overall Financial Resource Strain (CARDIA)    Difficulty of Paying Living Expenses: Not hard at all  Food Insecurity: No Food Insecurity (06/16/2022)   Hunger Vital Sign    Worried About Running Out of Food in the Last Year: Never true    [place] in the Last Year: Never true  Transportation Needs: No Transportation Needs (06/16/2022)   PRAPARE - Hydrologist (Medical): No    Lack of Transportation (Non-Medical): No  Physical Activity: Insufficiently Active (06/16/2022)   Exercise Vital Sign    Days of Exercise per Week: 3 days    Minutes of Exercise per Session: 30 min  Stress: No Stress Concern Present (06/16/2022)   [place]     Feeling of Stress : Not at all  Social Connections: Moderately Integrated (06/16/2022)   Social Connection and Isolation Panel [NHANES]    Frequency of Communication with Friends and Family: More than three times a week    Frequency of Social Gatherings with Friends and Family: More than three times a week    Attends Religious Services: More than 4 times per year    Active Member of [employer] or Organizations: Yes    Attends Archivist Meetings: More than 4 times per year    Marital Status: Widowed   Review of systems General: negative for malaise, night sweats, fever, chills, weight loss Neck: Negative for lumps, goiter, pain and significant neck swelling Resp: Negative for cough, wheezing, dyspnea at rest CV: Negative for chest pain, leg swelling, palpitations, orthopnea GI: denies melena, hematochezia, nausea, vomiting, diarrhea, dysphagia, odyonophagia, early satiety or unintentional weight loss. +constipation  MSK: Negative for joint pain or swelling, back pain, and muscle pain. Derm: Negative for itching or rash Psych: Denies depression, anxiety, memory loss, confusion. No homicidal or suicidal ideation.  Heme: Negative for prolonged bleeding, bruising easily, and swollen nodes. Endocrine: Negative for cold or heat intolerance, polyuria, polydipsia and goiter. Neuro: negative for tremor, gait imbalance, syncope and seizures. The remainder of the review of systems is noncontributory.  Physical Exam: BP 121/72 (BP Location: Left Arm, Patient Position: Sitting, Cuff Size: Normal)   Pulse 87   Temp 97.8 F (36.6 C) (Oral)   Ht 5\' 3"  (1.6 m)   Wt 171 lb 4.8 oz (77.7 kg)   BMI 30.34 kg/m  General:   Alert and oriented. No distress noted. Pleasant and cooperative.  Head:  Normocephalic and atraumatic. Eyes:  Conjuctiva clear without scleral icterus. Mouth:  Oral mucosa pink  and moist. Good dentition. No lesions. Heart: Normal rate and rhythm, s1 and s2 heart sounds present.   Lungs: Clear lung sounds in all lobes. Respirations equal and unlabored. Abdomen:  +BS, soft, non-tender and non-distended. No rebound or guarding. No HSM or masses noted. Derm: No palmar erythema or jaundice Msk:  Symmetrical without gross deformities. Normal posture. Extremities:  Without edema. Neurologic:  Alert and  oriented x4 Psych:  Alert and cooperative. Normal mood and affect.  Invalid input(s): "6 MONTHS"   ASSESSMENT: Susan Haney is a 89 y.o. female presenting today for follow up of GERD and Constipation.  GERD: well managed with diet, needing PPI  maybe twice per month, has relief upon taking. No dysphagia or odynophagia. No early satiety. Will continue to avoid trigger foods, use PPI PRN.  Constipation: did not tolerate Amitiza 70mcg BID, taking 27mcg BID now with hard stool balls daily or diarrhea if she doubles her morning dose to and takes both pills at once. Does not feel she is emptying her bowels sufficiently.  Will stop amitiaz and try Ibsrela 50mg  BID, samples provided today. Ideally she should give this about 2 weeks to see if she responds well to it, I made her aware she may have some diarrhea or GI upset upon starting, which is normal and should improve, however, if she is having profuse diarrhea, >3-4 episodes per day, she needs to make me aware. Should Increase water intake, aim for atleast 64 oz per day Increase fruits, veggies and whole grains, especially kiwi and prunes.    PLAN:  Continue PPI PRN 2. Stop amitiza, start ibsrela 50mg  BID  3. Continue with reflux precautions  4. Increase water intake, fruits, veggies, whole grains   All questions were answered, patient verbalized understanding and is in agreement with plan as outlined above.   Follow Up: 1 year  Su Duma L. Alver Sorrow, MSN, APRN, AGNP-C Adult-Gerontology Nurse Practitioner [facility]  I have reviewed the note and agree with the APP's assessment as described in this  progress note  Maylon Peppers, MD Gastroenterology and Hepatology [facility]

## 2022-10-05 ENCOUNTER — Telehealth (INDEPENDENT_AMBULATORY_CARE_PROVIDER_SITE_OTHER): Payer: Self-pay

## 2022-10-05 NOTE — Telephone Encounter (Signed)
[facility] [address] [place], [place] [address] Hours of Operation: Address: 5 a.m. - 10 p.m. PT, Monday-Friday [address] [place], [place] [address] 6 a.m. - 3 p.m. PT, [place] [address] Date: 10/05/2022 To: Scherrie Gerlach From: [facility] Phone: [phone] Phone: [phone] Fax: [phone] Reference #: [id number]  RE: Prior Authorization Request __________________________________________________________________________________ Patient Name: Susan Haney Patient DOB: [birth year] Patient ID: [id number] Status of Request: Deny Medication Name: Orpah Cobb Tab 50mg  GPI/NDC: [id number] Decision Notes: Medicare allows [place] to cover a drug only when it is a Part D drug. A Part D drug is one that is used for a "medically accepted indication." A medically accepted indication means the use is approved by the Food and Drug Administration (FDA) OR the use is supported by one of the following accepted references: (1) [place]. (2) Micromedex [place]. IBSRELA TAB 50MG  is not FDA approved for your medical condition(s): Chronic idiopathic constipation. These condition(s) are not supported by one of the accepted references. Therefore your drug is denied because it is not being used for a "medically accepted indication." Reviewed by: Karlyn Agee, Jacqulyn Bath

## 2022-10-05 NOTE — Telephone Encounter (Signed)
I called [facility] spoke with [place] she says they are trying to reach the patient to set up patient assistance for her. Patient may call [phone] option 1.

## 2022-10-05 NOTE — Telephone Encounter (Signed)
I called the patient and she is aware to reach out to the pharmacy at the number below to get free medication.

## 2022-10-06 ENCOUNTER — Telehealth: Payer: Self-pay | Admitting: *Deleted

## 2022-10-06 ENCOUNTER — Other Ambulatory Visit: Payer: Self-pay | Admitting: *Deleted

## 2022-10-06 MED ORDER — MOTEGRITY 1 MG PO TABS: ORAL_TABLET | ORAL | 0 refills | Status: DC

## 2022-10-06 NOTE — Telephone Encounter (Signed)
Susan Haney from [employer] assist calling about the call they got from [place] yesterday to sign patient up for patient assist program. The med was denied by insurance. Denial reason was for off label indication. Diagnosis chronic idopathic constipation and it is currently approved for ibs -c or ibs -d or k58.1. he is hoping we can provide insurance with new information to overturn the decision if patient does have this diagnosis. Before going forward with this case they will need to hear back from [place] with a new diagnosis and resubmit prior auth [phone] option 1

## 2022-10-06 NOTE — Telephone Encounter (Signed)
Discussed with patient per [facility] - She does not technically have a diagnosis of IBS. We may have to try another medication, she has failed trulance and linzess in the past, amitiza was not working well for her. I can try to send motegrity 1mg  daily and see if insurance will cover this.  Patient did want to try motegrity. I sent to pharmacy.

## 2022-10-07 ENCOUNTER — Other Ambulatory Visit (INDEPENDENT_AMBULATORY_CARE_PROVIDER_SITE_OTHER): Payer: Self-pay | Admitting: *Deleted

## 2022-10-07 NOTE — Telephone Encounter (Signed)
Patient's co pay with motegrity is $100. Is there anything else that could be tried.

## 2022-10-07 NOTE — Telephone Encounter (Signed)
Discussed with patient per [facility] - She has tried most everything else. We could continue on the amitiza 59mcg BID and add Miralax.  can Start taking Miralax 1 capful every day for one week. If bowel movements do not improve, increase to 1 capful every 12 hours. If after two weeks there is no improvement, increase to 1 capful every 8 hours   Patient verbalized understanding.

## 2022-10-19 ENCOUNTER — Other Ambulatory Visit: Payer: Self-pay | Admitting: Family Medicine

## 2022-10-26 NOTE — Progress Notes (Signed)
   CC:  headaches  Follow-up Visit  Last visit: 05/12/22  Brief HPI: 89 year old female with a history of cervical and lumbar spondylosis, HTN, endometrial cancer, diabetes who follows in clinic for headaches. ESR/CRP were normal. Brain MRI 02/09/22 was unremarkable.   At her last visit she was referred to neck PT.  Interval History: She is continuing to have headaches. They are slightly worse than her less visit. Headaches are primarily sharp occipital pain which radiates up her scalp. She cannot lie back at night due to severe pain. Neck PT did not help and exacerbated her pain.  Memory has improved since stopping Pristiq.  Current Headache Regimen: Preventative: none Abortive: none   Prior Therapies                                  Lyrica 75 mg QHS - fatigue Gabapentin 100 mg QHS Amitriptyline 10 mg QHS Cymbalta 20 mg daily Pristiq 25 mg daily - anxiety, jitteriness Lisinopril Back injections Occipital nerve block - lack of efficacy  Physical Exam:   Vital Signs: BP (!) 145/75 (BP Location: Left Arm, Patient Position: Sitting, Cuff Size: Normal) Comment: pt takes blood pressure medication, readings have been elelvated at home per pt.  Pulse 76   Ht 5\' 3"  (1.6 m)   Wt 174 lb (78.9 kg)   BMI 30.82 kg/m  GENERAL:  well appearing, in no acute distress, alert  SKIN:  Color, texture, turgor normal. No rashes or lesions HEAD:  Normocephalic/atraumatic. RESP: normal respiratory effort MSK:  +tenderness to palpation over left neck and occiput  NEUROLOGICAL: Mental Status:     10/27/2022   10:15 AM 05/12/2022    9:24 AM  Montreal Cognitive Assessment   Visuospatial/ Executive (0/5) 5 1  Naming (0/3) 3 3  Attention: Read list of digits (0/2) 2 2  Attention: Read list of letters (0/1) 1 1  Attention: Serial 7 subtraction starting at 100 (0/3) 1 2  Language: Repeat phrase (0/2) 2 2  Language : Fluency (0/1) 1 1  Abstraction (0/2) 2 2  Delayed Recall (0/5) 4 4   Orientation (0/6) 6 6  Total 27 24  Adjusted Score (based on education)  24   Cranial Nerves: PERRL, face symmetric, no dysarthria, hearing grossly intact Motor: moves all extremities equally Gait: normal-based.  IMPRESSION: 89 year old female with a history of cervical and lumbar spondylosis, HTN, endometrial cancer, diabetes who presents for follow up of headaches and neck pain. MOCA today is improved at 27/30, and she has not had any further cognitive symptoms since discontinuing Pristiq. She continues to have pain despite physical therapy and multiple medications. Will refer to orthopedics to discuss alternate treatment options including possible neck injections. She would prefer not to take a daily medication. Will prescribe lose dose baclofen at night as needed for her neck pain.  PLAN: -Start baclofen 5 mg QHS PRN for neck pain/spasms -Referral to orthopedics for cervical spondylosis   Follow-up: 8 months  I spent a total of 29 minutes on the date of the service. Headache education was done. Discussed treatment options including preventive and acute medications, natural supplements, and physical therapy. Discussed medication side effects, adverse reactions and drug interactions. Written educational materials and patient instructions outlining all of the above were given.  Ocie Doyne, MD 10/27/22 10:54 AM

## 2022-10-27 ENCOUNTER — Encounter: Payer: Self-pay | Admitting: Psychiatry

## 2022-10-27 ENCOUNTER — Telehealth: Payer: Self-pay | Admitting: Psychiatry

## 2022-10-27 ENCOUNTER — Ambulatory Visit: Payer: Medicare Other | Admitting: Psychiatry

## 2022-10-27 VITALS — BP 131/74 | HR 80 | Ht 63.0 in | Wt 174.0 lb

## 2022-10-27 DIAGNOSIS — M4802 Spinal stenosis, cervical region: Secondary | ICD-10-CM

## 2022-10-27 DIAGNOSIS — G4486 Cervicogenic headache: Secondary | ICD-10-CM

## 2022-10-27 MED ORDER — BACLOFEN 10 MG PO TABS: 5.0000 mg | ORAL_TABLET | Freq: Every evening | ORAL | 6 refills | Status: DC | PRN

## 2022-10-27 NOTE — Telephone Encounter (Signed)
Referral sent to [facility], phone # [phone]. 

## 2022-11-08 NOTE — Telephone Encounter (Signed)
Received fax that pt is scheduled 11/16/22 with Dr. Shon Baton.

## 2022-11-15 ENCOUNTER — Encounter: Payer: Self-pay | Admitting: Family Medicine

## 2022-11-15 ENCOUNTER — Ambulatory Visit (INDEPENDENT_AMBULATORY_CARE_PROVIDER_SITE_OTHER): Payer: Medicare Other | Admitting: Family Medicine

## 2022-11-15 DIAGNOSIS — R7303 Prediabetes: Secondary | ICD-10-CM

## 2022-11-15 DIAGNOSIS — K219 Gastro-esophageal reflux disease without esophagitis: Secondary | ICD-10-CM

## 2022-11-15 DIAGNOSIS — K5904 Chronic idiopathic constipation: Secondary | ICD-10-CM | POA: Diagnosis not present

## 2022-11-15 DIAGNOSIS — I129 Hypertensive chronic kidney disease with stage 1 through stage 4 chronic kidney disease, or unspecified chronic kidney disease: Secondary | ICD-10-CM | POA: Diagnosis not present

## 2022-11-15 DIAGNOSIS — I10 Essential (primary) hypertension: Secondary | ICD-10-CM

## 2022-11-15 DIAGNOSIS — N1832 Chronic kidney disease, stage 3b: Secondary | ICD-10-CM | POA: Diagnosis not present

## 2022-11-15 LAB — BAYER DCA HB A1C WAIVED: HB A1C (BAYER DCA - WAIVED): 5.7 % — ABNORMAL HIGH (ref 4.8–5.6)

## 2022-11-15 LAB — CMP14+EGFR
ALT: 14 IU/L (ref 0–32)
AST: 16 IU/L (ref 0–40)
Albumin/Globulin Ratio: 1.6 (ref 1.2–2.2)
Albumin: 3.9 g/dL (ref 3.7–4.7)
Alkaline Phosphatase: 52 IU/L (ref 44–121)
BUN/Creatinine Ratio: 22 (ref 12–28)
BUN: 30 mg/dL — ABNORMAL HIGH (ref 8–27)
Bilirubin Total: 0.4 mg/dL (ref 0.0–1.2)
CO2: 22 mmol/L (ref 20–29)
Calcium: 9.6 mg/dL (ref 8.7–10.3)
Chloride: 105 mmol/L (ref 96–106)
Creatinine, Ser: 1.36 mg/dL — ABNORMAL HIGH (ref 0.57–1.00)
Globulin, Total: 2.5 g/dL (ref 1.5–4.5)
Glucose: 104 mg/dL — ABNORMAL HIGH (ref 70–99)
Potassium: 4.6 mmol/L (ref 3.5–5.2)
Sodium: 142 mmol/L (ref 134–144)
Total Protein: 6.4 g/dL (ref 6.0–8.5)
eGFR: 37 mL/min/{1.73_m2} — ABNORMAL LOW (ref >59)

## 2022-11-15 NOTE — Progress Notes (Signed)
Established Patient Office Visit  Subjective   Patient ID: Susan Haney, female    DOB: [birth year]  Age: 89 y.o. MRN: [id number]  Chief Complaint  Patient presents with   Medical Management of Chronic Issues   Hypertension   Chronic Kidney Disease   Prediabetes    HPI HTN Complaint with meds - Yes Current Medications - lisinopril-HCTZ Checking BP at home: 110s/70s Exercising Regularly - walking daily Pertinent ROS:  Headache - chronic headaches due to neck pain, managed by neurology Fatigue - No Visual Disturbances - No Chest pain - No Dyspnea - No Palpitations - No LE edema - mild, baseline  2. Prediabetes Last a1c was 5.7. She is on farxiga for CKD. Her fasting blood sugars are <110. Only checks blood sugar occasionally. She sees podiatry.  3.GERD/constipation Sees GI. Reports well controlled. She is on prilosec for GERD. She is on metamucil and amitiza for constipation. Denies nausea, vomiting, blood in stool, rectal bleeding, dysphagia.       11/15/2022    8:14 AM 06/16/2022    8:22 AM 05/14/2022    8:05 AM  Depression screen PHQ 2/9  Decreased Interest 0 0 0  Down, Depressed, Hopeless 0 0 0  PHQ - 2 Score 0 0 0  Altered sleeping 0 0 3  Tired, decreased energy 0 0 0  Change in appetite 0 0 0  Feeling bad or failure about yourself  0 0 0  Trouble concentrating 0 0 0  Moving slowly or fidgety/restless 0 0 0  Suicidal thoughts 0 0 0  PHQ-9 Score 0 0 3  Difficult doing work/chores Not difficult at all Not difficult at all Not difficult at all      11/15/2022    8:14 AM 05/14/2022    8:07 AM 02/10/2022   10:00 AM 11/03/2021    9:24 AM  GAD 7 : Generalized Anxiety Score  Nervous, Anxious, on Edge 0 0 0 0  Control/stop worrying 0 0 0 0  Worry too much - different things 0 0 0 0  Trouble relaxing 0 0 0 0  Restless 0 0 0 0  Easily annoyed or irritable 0 0 0 0  Afraid - awful might happen 0 0 0 0  Total GAD 7 Score 0 0 0 0  Anxiety Difficulty Not  difficult at all Not difficult at all Not difficult at all Not difficult at all     Past Medical History:  Diagnosis Date   Anemia    history ,  took iron for years   Arthritis    Bicuspid aortic valve    Cancer ([facility]) 2017   endometrial cancer / cervical   Cellulitis 12/31/2017   BLE, ankles   Chronic back pain    DDD (degenerative disc disease), lumbar    Endometrial cancer ([facility])    GERD (gastroesophageal reflux disease)    History of radiation therapy 06/15/16-07/06/16   vaginal cuff 30 Gy in 5 fractions   Hypertension    Prediabetes    Spondylosis    lumbar      ROS As per HPI.    Objective:     BP 113/65   Pulse 68   Temp 98.4 F (36.9 C) (Temporal)   Ht 5\' 3"  (1.6 m)   Wt 172 lb 2 oz (78.1 kg)   SpO2 96%   BMI 30.49 kg/m  BP Readings from Last 3 Encounters:  11/15/22 113/65  10/27/22 131/74  10/04/22 121/72  Physical Exam Vitals and nursing note reviewed.  Constitutional:      General: She is not in acute distress.    Appearance: She is not ill-appearing, toxic-appearing or diaphoretic.  HENT:     Head: Normocephalic and atraumatic.     Mouth/Throat:     Mouth: Mucous membranes are moist.     Pharynx: Oropharynx is clear.  Eyes:     General:        Right eye: No discharge.        Left eye: No discharge.     Pupils: Pupils are equal, round, and reactive to light.  Cardiovascular:     Rate and Rhythm: Normal rate and regular rhythm.     Heart sounds: Normal heart sounds. No murmur heard. Pulmonary:     Effort: Pulmonary effort is normal. No respiratory distress.     Breath sounds: Normal breath sounds.  Abdominal:     General: Bowel sounds are normal. There is no distension.     Palpations: Abdomen is soft.     Tenderness: There is no abdominal tenderness. There is no guarding or rebound.  Musculoskeletal:     Cervical back: No rigidity.     Right lower leg: 1+ Edema present.     Left lower leg: 1+ Edema present.  Skin:    General:  Skin is warm and dry.  Neurological:     General: No focal deficit present.     Mental Status: She is alert and oriented to person, place, and time.  Psychiatric:        Mood and Affect: Mood normal.        Behavior: Behavior normal.    No results found for any visits on 11/15/22.    The ASCVD Risk score (Arnett DK, et al., 2019) failed to calculate for the following reasons:   The 2019 ASCVD risk score is only valid for ages 69 to 75    Assessment & Plan:   Renae was seen today for medical management of chronic issues, hypertension, chronic kidney disease and prediabetes.  Diagnoses and all orders for this visit:  Prediabetes A1c 5.7 today. Continue diet and exercise.  -     Bayer DCA Hb A1c Waived  Primary hypertension Well controlled on current regimen.  -     CMP14+EGFR  Stage 3b chronic kidney disease (HCC) Labs pending. On farxiga and ACE. -     CMP14+EGFR -     Cancel: BMP8+EGFR  Gastroesophageal reflux disease without esophagitis Well controlled on current regimen.   Chronic idiopathic constipation Managed by GI. Well controlled on current regimen.   Return in about 6 months (around 05/17/2023) for CPE.   The patient indicates understanding of these issues and agrees with the plan.  Gabriel Earing, FNP

## 2022-11-16 DIAGNOSIS — M542 Cervicalgia: Secondary | ICD-10-CM | POA: Diagnosis not present

## 2022-11-17 ENCOUNTER — Other Ambulatory Visit: Payer: Self-pay | Admitting: *Deleted

## 2022-11-17 DIAGNOSIS — N1832 Chronic kidney disease, stage 3b: Secondary | ICD-10-CM

## 2022-11-24 ENCOUNTER — Other Ambulatory Visit: Payer: Medicare Other

## 2022-11-24 DIAGNOSIS — N1832 Chronic kidney disease, stage 3b: Secondary | ICD-10-CM | POA: Diagnosis not present

## 2022-11-24 LAB — BMP8+EGFR
BUN/Creatinine Ratio: 17 (ref 12–28)
BUN: 21 mg/dL (ref 8–27)
CO2: 24 mmol/L (ref 20–29)
Calcium: 10 mg/dL (ref 8.7–10.3)
Chloride: 105 mmol/L (ref 96–106)
Creatinine, Ser: 1.22 mg/dL — ABNORMAL HIGH (ref 0.57–1.00)
Glucose: 103 mg/dL — ABNORMAL HIGH (ref 70–99)
Potassium: 4.4 mmol/L (ref 3.5–5.2)
Sodium: 143 mmol/L (ref 134–144)
eGFR: 42 mL/min/{1.73_m2} — ABNORMAL LOW (ref >59)

## 2022-12-08 ENCOUNTER — Ambulatory Visit: Payer: Medicare Other | Admitting: Psychiatry

## 2022-12-10 ENCOUNTER — Other Ambulatory Visit: Payer: Self-pay | Admitting: Family Medicine

## 2022-12-10 DIAGNOSIS — I10 Essential (primary) hypertension: Secondary | ICD-10-CM

## 2022-12-14 ENCOUNTER — Other Ambulatory Visit: Payer: Self-pay | Admitting: Family Medicine

## 2022-12-14 DIAGNOSIS — N1832 Chronic kidney disease, stage 3b: Secondary | ICD-10-CM

## 2022-12-15 ENCOUNTER — Other Ambulatory Visit: Payer: Self-pay | Admitting: Family Medicine

## 2022-12-15 DIAGNOSIS — N1832 Chronic kidney disease, stage 3b: Secondary | ICD-10-CM

## 2022-12-15 MED ORDER — DAPAGLIFLOZIN PROPANEDIOL 10 MG PO TABS
10.0000 mg | ORAL_TABLET | Freq: Every day | ORAL | 0 refills | Status: DC
Start: 2022-12-15 — End: 2023-02-18

## 2023-01-06 DIAGNOSIS — B351 Tinea unguium: Secondary | ICD-10-CM | POA: Diagnosis not present

## 2023-01-06 DIAGNOSIS — L84 Corns and callosities: Secondary | ICD-10-CM | POA: Diagnosis not present

## 2023-01-06 DIAGNOSIS — M79676 Pain in unspecified toe(s): Secondary | ICD-10-CM | POA: Diagnosis not present

## 2023-01-06 DIAGNOSIS — E1142 Type 2 diabetes mellitus with diabetic polyneuropathy: Secondary | ICD-10-CM | POA: Diagnosis not present

## 2023-02-08 ENCOUNTER — Encounter: Payer: Self-pay | Admitting: Nurse Practitioner

## 2023-02-08 ENCOUNTER — Ambulatory Visit (INDEPENDENT_AMBULATORY_CARE_PROVIDER_SITE_OTHER): Payer: Medicare Other

## 2023-02-08 ENCOUNTER — Ambulatory Visit: Payer: Medicare Other | Admitting: Nurse Practitioner

## 2023-02-08 VITALS — BP 136/73 | HR 81 | Temp 97.8°F | Resp 20 | Ht 63.0 in | Wt 167.0 lb

## 2023-02-08 DIAGNOSIS — M47816 Spondylosis without myelopathy or radiculopathy, lumbar region: Secondary | ICD-10-CM | POA: Diagnosis not present

## 2023-02-08 DIAGNOSIS — M25552 Pain in left hip: Secondary | ICD-10-CM

## 2023-02-08 DIAGNOSIS — M160 Bilateral primary osteoarthritis of hip: Secondary | ICD-10-CM | POA: Diagnosis not present

## 2023-02-08 MED ORDER — METHYLPREDNISOLONE ACETATE 80 MG/ML IJ SUSP
80.0000 mg | Freq: Once | INTRAMUSCULAR | Status: AC
Start: 2023-02-08 — End: 2023-02-08
  Administered 2023-02-08: 80 mg via INTRAMUSCULAR

## 2023-02-08 MED ORDER — KETOROLAC TROMETHAMINE 60 MG/2ML IM SOLN
60.0000 mg | Freq: Once | INTRAMUSCULAR | Status: AC
Start: 2023-02-08 — End: 2023-02-08
  Administered 2023-02-08: 60 mg via INTRAMUSCULAR

## 2023-02-08 NOTE — Progress Notes (Signed)
Subjective:    Patient ID: Susan Haney, female    DOB: May 12, 1932, 89 y.o.   MRN: [id number]   Chief Complaint: Knee Pain (Left side/) and Hip Pain (Left side/)   Patient comes in c/o left  hip and knee pain. She has had knee pain for awhile and thinks that is causing hip pain.  Hip Pain  The pain is present in the left hip. The quality of the pain is described as stabbing. The pain is at a severity of 7/10. The pain is moderate. The pain has been Intermittent since onset. Associated symptoms include an inability to bear weight and a loss of motion. Pertinent negatives include no numbness or tingling. The symptoms are aggravated by movement and weight bearing. She has tried acetaminophen for the symptoms. The treatment provided mild relief.    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  Neurological:  Negative for tingling and numbness.       Objective:   Physical Exam Vitals reviewed.  Constitutional:      Appearance: Normal appearance.  Cardiovascular:     Rate and Rhythm: Normal rate and regular rhythm.     Heart sounds: Normal heart sounds.  Pulmonary:     Breath sounds: Normal  breath sounds.  Musculoskeletal:     Comments: FROM of hip with pain on internal and external rotation. Motor strength and sensation distally intact.  Skin:    General: Skin is warm.  Neurological:     General: No focal deficit present.     Mental Status: She is alert and oriented to person, place, and time.  Psychiatric:        Mood and Affect: Mood normal.        Behavior: Behavior normal.    BP 136/73   Pulse 81   Temp 97.8 F (36.6 C) (Temporal)   Resp 20   Ht 5\' 3"  (1.6 m)   Wt 167 lb (75.8 kg)   SpO2 96%   BMI 29.58 kg/m    Left hip pain- osteoarthritis-Preliminary reading by Paulene Floor, FNP  [facility]      Assessment & Plan:   Susan Haney in today with chief complaint of Knee Pain (Left side/) and Hip Pain (Left side/)   1. Left hip pain Ice Rest RTO prn - DG HIP UNILAT W OR W/O PELVIS 2-3 VIEWS LEFT - ketorolac (TORADOL) injection 60 mg - methylPREDNISolone acetate (DEPO-MEDROL) injection 80 mg    The above assessment and management plan was discussed with the patient. The patient verbalized understanding of and has agreed to the management plan. Patient is aware to call the clinic if symptoms persist or worsen. Patient is aware when to return to the  clinic for a follow-up visit. Patient educated on when it is appropriate to go to the emergency department.   Mary-Margaret Daphine Deutscher, FNP

## 2023-02-08 NOTE — Patient Instructions (Signed)
Hip Pain The hip is the joint between the upper legs and the lower pelvis. The bones, cartilage, tendons, and muscles of your hip joint support your body and allow you to move around. Hip pain can range from a minor ache to severe pain in one or both of your hips. The pain may be felt on the inside of the hip joint near the groin, or on the outside near the buttocks and upper thigh. You may also have swelling or stiffness in your hip area. Follow these instructions at home: Managing pain, stiffness, and swelling     If told, put ice on the painful area. Put ice in a plastic bag. Place a towel between your skin and the bag. Leave the ice on for 20 minutes, 2-3 times a day. If told, apply heat to the affected area as often as told by your health care provider. Use the heat source that your provider recommends, such as a moist heat pack or a heating pad. Place a towel between your skin and the heat source. Leave the heat on for 20-30 minutes. If your skin turns bright red, remove the ice or heat right away to prevent skin damage. The risk of damage is higher if you cannot feel pain, heat, or cold. Activity Do exercises as told by your provider. Avoid activities that cause pain. General instructions  Take over-the-counter and prescription medicines only as told by your provider. Keep a journal of your symptoms. Write down: How often you have hip pain. The location of your pain. What the pain feels like. What makes the pain worse. Sleep with a pillow between your legs on your most comfortable side. Keep all follow-up visits. Your provider will monitor your pain and activity. Contact a health care provider if: You cannot put weight on your leg. Your pain or swelling gets worse after a week. It gets harder to walk. You have a fever. Get help right away if: You fall. You have a sudden increase in pain and swelling in your hip. Your hip is red or swollen or very tender to touch. This  information is not intended to replace advice given to you by your health care provider. Make sure you discuss any questions you have with your health care provider. Document Revised: 03/09/2022 Document Reviewed: 03/09/2022 Elsevier Patient Education  2024 [employer].  

## 2023-02-17 ENCOUNTER — Other Ambulatory Visit: Payer: Self-pay | Admitting: Family Medicine

## 2023-02-17 DIAGNOSIS — N1832 Chronic kidney disease, stage 3b: Secondary | ICD-10-CM

## 2023-03-07 ENCOUNTER — Telehealth: Payer: Self-pay | Admitting: Psychiatry

## 2023-03-07 NOTE — Telephone Encounter (Signed)
Unable to reach pt over the phone, sent mychart msg informing pt of needing to reschedule 07/04/23 appt - MD leaving practice

## 2023-03-17 DIAGNOSIS — B351 Tinea unguium: Secondary | ICD-10-CM | POA: Diagnosis not present

## 2023-03-17 DIAGNOSIS — E1142 Type 2 diabetes mellitus with diabetic polyneuropathy: Secondary | ICD-10-CM | POA: Diagnosis not present

## 2023-03-17 DIAGNOSIS — L84 Corns and callosities: Secondary | ICD-10-CM | POA: Diagnosis not present

## 2023-03-17 DIAGNOSIS — M79676 Pain in unspecified toe(s): Secondary | ICD-10-CM | POA: Diagnosis not present

## 2023-04-08 DIAGNOSIS — Z96653 Presence of artificial knee joint, bilateral: Secondary | ICD-10-CM | POA: Diagnosis not present

## 2023-04-08 DIAGNOSIS — M7052 Other bursitis of knee, left knee: Secondary | ICD-10-CM | POA: Diagnosis not present

## 2023-04-08 DIAGNOSIS — M7062 Trochanteric bursitis, left hip: Secondary | ICD-10-CM | POA: Diagnosis not present

## 2023-04-08 DIAGNOSIS — S76312D Strain of muscle, fascia and tendon of the posterior muscle group at thigh level, left thigh, subsequent encounter: Secondary | ICD-10-CM | POA: Diagnosis not present

## 2023-04-21 ENCOUNTER — Other Ambulatory Visit: Payer: Self-pay | Admitting: Family Medicine

## 2023-04-21 DIAGNOSIS — N1832 Chronic kidney disease, stage 3b: Secondary | ICD-10-CM

## 2023-04-26 ENCOUNTER — Ambulatory Visit: Payer: Medicare Other | Admitting: Nurse Practitioner

## 2023-04-26 ENCOUNTER — Encounter: Payer: Self-pay | Admitting: Nurse Practitioner

## 2023-04-26 VITALS — BP 131/70 | HR 66 | Temp 97.8°F | Resp 20 | Ht 63.0 in | Wt 171.0 lb

## 2023-04-26 DIAGNOSIS — R519 Headache, unspecified: Secondary | ICD-10-CM

## 2023-04-26 DIAGNOSIS — Z23 Encounter for immunization: Secondary | ICD-10-CM | POA: Diagnosis not present

## 2023-04-26 DIAGNOSIS — I952 Hypotension due to drugs: Secondary | ICD-10-CM

## 2023-04-26 DIAGNOSIS — R0981 Nasal congestion: Secondary | ICD-10-CM

## 2023-04-26 MED ORDER — FLUTICASONE PROPIONATE 50 MCG/ACT NA SUSP
2.0000 | Freq: Every day | NASAL | 6 refills | Status: DC
Start: 2023-04-26 — End: 2023-12-27

## 2023-04-26 NOTE — Addendum Note (Signed)
Addended by: Bennie Pierini on: 04/26/2023 09:37 AM   Modules accepted: Level of Service

## 2023-04-26 NOTE — Patient Instructions (Signed)

## 2023-04-26 NOTE — Progress Notes (Signed)
Subjective:    Patient ID: [name], female    DOB: April 08, 1932, 89 y.o.   MRN: [id number]   Chief Complaint: Blood pressure low (Pain on right side of head)   HPI  Patient comes in today with 2 complaints: - pain in right side of head- started a month or so ago. Pain is intermittent and only lasts a couple of minutes. This morning pain wont go away. Rates pain 8/10 whne occurs. She has been seeing neurologist for headaches and has had sveral scans which have all been negative.  - blood pressure running low at home. Running 80-110 systolic. Averaging in 90's systolic. Has occasional dizziness.  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  Eyes:  Negative for pain.  Respiratory:  Negative for shortness of breath.   Cardiovascular:  Negative for chest pain, palpitations and leg swelling.  Gastrointestinal:  Negative for abdominal pain.  Endocrine: Negative for polydipsia.  Skin:  Negative for rash.  Neurological:  Positive for dizziness (occasionally). Negative for weakness and headaches.  Hematological:  Does not bruise/bleed easily.  All other systems reviewed and are negative.       Objective:   Physical Exam Constitutional:      Appearance: Normal appearance.  HENT:     Nose: Congestion and rhinorrhea present.     Mouth/Throat:     Pharynx: No oropharyngeal exudate or posterior oropharyngeal erythema.  Cardiovascular:     Rate and Rhythm: Normal rate and regular rhythm.     Heart sounds: Normal heart sounds.  Pulmonary:     Breath sounds: Normal breath sounds.  Skin:    General: Skin is warm.  Neurological:     General: No focal deficit present.     Mental Status: She is alert and oriented to person, place, and time.  Psychiatric:        Mood and Affect: Mood normal.        Behavior: Behavior normal.    BP 131/70   Pulse 66   Temp 97.8 F (36.6 C) (Temporal)   Resp 20   Ht 5\' 3"  (1.6 m)   Wt 171 lb (77.6 kg)   SpO2 97%   BMI 30.29 kg/m         Assessment & Plan:    [name] in today with chief complaint of Blood pressure low (Pain on right side of head)   1. Scalp pain monitor  2. Hypotension due to drugs Cut lisinopril /hydrochlorothiazide in half and take half tablet daily Continue to check blood pressure at home and bring with you at next appointment with PCP.  3. Nasal congestion 1. Take meds as prescribed 2. Use a cool mist humidifier especially during  the winter months and when heat has been humid. 3. Use saline nose sprays frequently 4. Saline irrigations of the nose can be very helpful if done frequently.  * 4X daily for 1 week*  * Use of a nettie pot can be helpful with this. Follow directions with this* 5. Drink plenty of fluids 6. Keep thermostat turn down low 7.For any cough or congestion- mucinex 8. For fever or aces or pains- take tylenol or ibuprofen appropriate for age and weight.  * for fevers greater than 101 orally you may alternate ibuprofen and tylenol every  3 hours.    - fluticasone (FLONASE) 50 MCG/ACT nasal spray; Place 2 sprays into both nostrils daily.  Dispense: 16 g; Refill: 6    The  above assessment and management plan was discussed with the patient. The patient verbalized understanding of and has agreed to the management plan. Patient is aware to call the clinic if symptoms persist or worsen. Patient is aware when to return to the clinic for a follow-up visit. Patient educated on when it is appropriate to go to the emergency department.   Mary-Margaret Daphine Deutscher, FNP

## 2023-04-27 ENCOUNTER — Ambulatory Visit: Payer: Medicare Other | Admitting: Physical Therapy

## 2023-05-03 ENCOUNTER — Ambulatory Visit: Payer: Medicare Other | Attending: Orthopedic Surgery | Admitting: Physical Therapy

## 2023-05-03 ENCOUNTER — Other Ambulatory Visit: Payer: Self-pay

## 2023-05-03 DIAGNOSIS — M25552 Pain in left hip: Secondary | ICD-10-CM | POA: Insufficient documentation

## 2023-05-03 DIAGNOSIS — M62838 Other muscle spasm: Secondary | ICD-10-CM | POA: Insufficient documentation

## 2023-05-03 NOTE — Therapy (Signed)
OUTPATIENT PHYSICAL THERAPY LOWER EXTREMITY EVALUATION   Patient Name: SHONTIA PIES MRN: [id number] DOB:[birth year], 89 y.o., female Today's Date: 05/03/2023  END OF SESSION:  PT End of Session - 05/03/23 1501     Visit Number 1    Number of Visits 12    Date for PT Re-Evaluation 06/14/23    Authorization Type FOTO.    PT Start Time 0231    PT Stop Time 0317    PT Time Calculation (min) 46 min    Activity Tolerance Patient tolerated treatment well    Behavior During Therapy [facility] for tasks assessed/performed             Past Medical History:  Diagnosis Date   Anemia    history ,  took iron for years   Arthritis    Bicuspid aortic valve    Cancer (HCC) 2017   endometrial cancer / cervical   Cellulitis 12/31/2017   BLE, ankles   Chronic back pain    DDD (degenerative disc disease), lumbar    Endometrial cancer (HCC)    GERD (gastroesophageal reflux disease)    History of radiation therapy 06/15/16-07/06/16   vaginal cuff 30 Gy in 5 fractions   Hypertension    Prediabetes    Spondylosis    lumbar   Past Surgical History:  Procedure Laterality Date   BUNIONECTOMY     CARPAL TUNNEL RELEASE Right 05/30/2018   Procedure: RIGHT CARPAL TUNNEL RELEASE;  Surgeon: Cindee Salt, MD;  Location: [facility];  Service: Orthopedics;  Laterality: Right;   COLONOSCOPY N/A 04/04/2014   Procedure: COLONOSCOPY;  Surgeon: Malissa Hippo, MD;  Location: [facility];  Service: Endoscopy;  Laterality: N/A;  100   EYE SURGERY     bil cataracts and lens implants   FOOT SURGERY     Left   Ovary removed: benign     REPLACEMENT TOTAL KNEE BILATERAL     2013   ROBOTIC ASSISTED TOTAL HYSTERECTOMY WITH BILATERAL SALPINGO OOPHERECTOMY Left 04/13/2016   Procedure: XI ROBOTIC ASSISTED TOTAL HYSTERECTOMY WITH LEFT SALPINGO OOPHORECTOMY SENTINEL LYMPH NODE BIOPSY;  Surgeon: Adolphus Birchwood, MD;  Location: [facility];  Service: Gynecology;  Laterality: Left;   TOTAL KNEE REVISION Left  02/12/2016   Procedure: LEFT TOTAL KNEE REVISION;  Surgeon: Samson Frederic, MD;  Location: [facility];  Service: Orthopedics;  Laterality: Left;   Patient Active Problem List   Diagnosis Date Noted   Prediabetes 05/14/2022   Chronic maxillary sinusitis 05/14/2022   Seasonal allergies 09/14/2021   Chronic eczema 09/14/2021   Burning sensation of lower extremity 09/14/2021   Degenerative disc disease, cervical 09/14/2021   Chronic idiopathic constipation 06/17/2021   Stage 3b chronic kidney disease (HCC) 05/06/2021   Perianal irritation 01/20/2021   Essential hypertension 08/06/2019   Vitamin D deficiency 08/06/2019   Family history of prostate cancer    Family history of cancer    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  REFERRING DIAG: Trochanteric bursitis of left hip.  THERAPY DIAG:  Pain in left hip  Other muscle spasm  Rationale for Evaluation and Treatment: Rehabilitation  ONSET DATE: Ongoing.  SUBJECTIVE:   SUBJECTIVE STATEMENT: The patient presents to the clinic with c/o chronic left hip pain.  Prolonged sitting and standing produces very high pain-levels  with a rating on an 8/10 today.  Walking tends to decrease her pain some.    PERTINENT HISTORY: Please see above. PAIN:  Are you having pain? Yes: NPRS scale: 8/10 Pain location: Left hip. Pain description: Ache, throb. Aggravating factors: As above. Relieving factors: as above.  PRECAUTIONS: None  RED FLAGS: None   WEIGHT BEARING RESTRICTIONS: No  FALLS:  Has patient fallen in last 6 months? No  LIVING ENVIRONMENT: Lives in: House/apartment Has following equipment at home: Cane.  OCCUPATION: Retired.  PLOF: Independent with basic ADLs  PATIENT GOALS: Decrease left hip  pain.   OBJECTIVE:  Note: Objective measures were completed at Evaluation unless otherwise noted.  DIAGNOSTIC FINDINGS: X-ray:  02/15/23:   Mild symmetric degenerative changes in the hips with joint space narrowing and spurring. No acute bony abnormality. Specifically, no fracture, subluxation, or dislocation. Degenerative changes in the visualized lower lumbar spine.   IMPRESSION: No acute bony abnormality.  PATIENT SURVEYS:  FOTO 40.2   POSTURE:  Equal leg lengths.  PALPATION: Tender to palpation over and posterior to the left greater trochanter and increased tone over left TFL and glu med.  LOWER EXTREMITY ROM:  WNL for left hip.  LOWER EXTREMITY MMT:  Left hip flexion is 4 to 4+/5 and abduction is 5/5.   GAIT/TRANSITION: Painful transition from sit to stand and gait antalgia observed with patient using a cane.  TODAY'S TREATMENT:                                                                                                                              DATE: HMP and IFC at 80-150 Hz on 40% scan x 20 minutes to patient's left lateral hip.   Normal modality response following removal of modality.   PATIENT EDUCATION:  Education details: Discussed dry needling as a potential intervention and educational and consent form provided Person educated: Patient Education method: Explanation Education comprehension: verbalized understanding  HOME EXERCISE PROGRAM:   ASSESSMENT:  CLINICAL IMPRESSION: The patient presents to [facility] with c/o chronic left hip pain that reaches very high pain levels with prolonged sitting and standing.  Her FOTO limitation score is a 40.2.  She has very good left hip range of motion and normal hip abduction strength.  Transition from sit to stand are painful and she has an antalgic gait pattern and she use a cane.  She is tender to palpation over and posterior to the left greater trochanter and increased tone over left TFL and glu med.  Patient  will benefit from skilled physical therapy intervention to address pain and deficits.  OBJECTIVE IMPAIRMENTS: Abnormal gait, decreased activity tolerance, decreased strength, increased muscle spasms, and pain.   ACTIVITY LIMITATIONS: standing, transfers, and locomotion level  PARTICIPATION LIMITATIONS: meal prep, cleaning, laundry, and community activity  PERSONAL FACTORS: Time since onset of injury/illness/exacerbation and 1 comorbidity: joint replacements  are also affecting patient's functional outcome.   REHAB POTENTIAL: Good  CLINICAL DECISION  MAKING: Stable/uncomplicated  EVALUATION COMPLEXITY: Low   GOALS:  LONG TERM GOALS: Target date: 06/14/23  Ind with an HEP.  Goal status: INITIAL  2.  Stand 20 minutes with left hip pain not > 3/10.  Goal status: INITIAL  3.  Sit 20 minutes with left hip pain not > 3/10.   Goal status: INITIAL  PLAN:  PT FREQUENCY: 2x/week  PT DURATION: 6 weeks  PLANNED INTERVENTIONS: 97110-Therapeutic exercises, 97530- Therapeutic activity, [id number]- Neuromuscular re-education, 97535- Self Care, [id number]- Manual therapy, 97014- Electrical stimulation (unattended), 97035- Ultrasound, Patient/Family education, Dry Needling, Cryotherapy, and Moist heat  PLAN FOR NEXT SESSION: Combo e'stim/US, STW/M, dry needling.  Resisted exercise to left hip.   Naiomy Watters, [place], PT 05/03/2023, 3:48 PM

## 2023-05-09 ENCOUNTER — Ambulatory Visit: Payer: Medicare Other | Admitting: Physical Therapy

## 2023-05-09 DIAGNOSIS — M62838 Other muscle spasm: Secondary | ICD-10-CM

## 2023-05-09 DIAGNOSIS — M25552 Pain in left hip: Secondary | ICD-10-CM | POA: Diagnosis not present

## 2023-05-09 NOTE — Therapy (Signed)
OUTPATIENT PHYSICAL THERAPY LOWER EXTREMITY EVALUATION   Patient Name: Susan Haney MRN: [id number] DOB:10-03-1931, 89 y.o., female Today's Date: 05/09/2023  END OF SESSION:  PT End of Session - 05/09/23 1237     Visit Number 2    Number of Visits 12    Authorization Type FOTO.    PT Start Time 1100    PT Stop Time 1156    PT Time Calculation (min) 56 min    Activity Tolerance Patient tolerated treatment well    Behavior During Therapy WFL for tasks assessed/performed             Past Medical History:  Diagnosis Date   Anemia    history ,  took iron for years   Arthritis    Bicuspid aortic valve    Cancer (HCC) 2017   endometrial cancer / cervical   Cellulitis 12/31/2017   BLE, ankles   Chronic back pain    DDD (degenerative disc disease), lumbar    Endometrial cancer (HCC)    GERD (gastroesophageal reflux disease)    History of radiation therapy 06/15/16-07/06/16   vaginal cuff 30 Gy in 5 fractions   Hypertension    Prediabetes    Spondylosis    lumbar   Past Surgical History:  Procedure Laterality Date   BUNIONECTOMY     CARPAL TUNNEL RELEASE Right 05/30/2018   Procedure: RIGHT CARPAL TUNNEL RELEASE;  Surgeon: Cindee Salt, MD;  Location: [facility];  Service: Orthopedics;  Laterality: Right;   COLONOSCOPY N/A 04/04/2014   Procedure: COLONOSCOPY;  Surgeon: Malissa Hippo, MD;  Location: [facility];  Service: Endoscopy;  Laterality: N/A;  100   EYE SURGERY     bil cataracts and lens implants   FOOT SURGERY     Left   Ovary removed: benign     REPLACEMENT TOTAL KNEE BILATERAL     2013   ROBOTIC ASSISTED TOTAL HYSTERECTOMY WITH BILATERAL SALPINGO OOPHERECTOMY Left 04/13/2016   Procedure: XI ROBOTIC ASSISTED TOTAL HYSTERECTOMY WITH LEFT SALPINGO OOPHORECTOMY SENTINEL LYMPH NODE BIOPSY;  Surgeon: Adolphus Birchwood, MD;  Location: [facility];  Service: Gynecology;  Laterality: Left;   TOTAL KNEE REVISION Left 02/12/2016   Procedure: LEFT TOTAL KNEE  REVISION;  Surgeon: Samson Frederic, MD;  Location: [facility];  Service: Orthopedics;  Laterality: Left;   Patient Active Problem List   Diagnosis Date Noted   Prediabetes 05/14/2022   Chronic maxillary sinusitis 05/14/2022   Seasonal allergies 09/14/2021   Chronic eczema 09/14/2021   Burning sensation of lower extremity 09/14/2021   Degenerative disc disease, cervical 09/14/2021   Chronic idiopathic constipation 06/17/2021   Stage 3b chronic kidney disease (HCC) 05/06/2021   Perianal irritation 01/20/2021   Essential hypertension 08/06/2019   Vitamin D deficiency 08/06/2019   Family history of prostate cancer    Family history of cancer    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  REFERRING DIAG: Trochanteric bursitis of left hip.  THERAPY DIAG:  Pain in left hip  Other muscle spasm  Rationale for Evaluation and Treatment: Rehabilitation  ONSET DATE: Ongoing.  SUBJECTIVE:   SUBJECTIVE STATEMENT: Hurting a lot yesterday while sitting in church.    PERTINENT HISTORY: Please see above. PAIN:  Are you having pain? Yes: NPRS scale: 8/10 Pain location: Left  hip. Pain description: Ache, throb. Aggravating factors: As above. Relieving factors: as above.  PRECAUTIONS: None  RED FLAGS: None   WEIGHT BEARING RESTRICTIONS: No  FALLS:  Has patient fallen in last 6 months? No  LIVING ENVIRONMENT: Lives in: House/apartment Has following equipment at home: Cane.  OCCUPATION: Retired.  PLOF: Independent with basic ADLs  PATIENT GOALS: Decrease left hip pain.   OBJECTIVE:  Note: Objective measures were completed at Evaluation unless otherwise noted.  DIAGNOSTIC FINDINGS: X-ray:  02/15/23:   Mild symmetric degenerative changes in the hips with  joint space narrowing and spurring. No acute bony abnormality. Specifically, no fracture, subluxation, or dislocation. Degenerative changes in the visualized lower lumbar spine.   IMPRESSION: No acute bony abnormality.  PATIENT SURVEYS:  FOTO 40.2   POSTURE:  Equal leg lengths.  PALPATION: Tender to palpation over and posterior to the left greater trochanter and increased tone over left TFL and glu med.  LOWER EXTREMITY ROM:  WNL for left hip.  LOWER EXTREMITY MMT:  Left hip flexion is 4 to 4+/5 and abduction is 5/5.   GAIT/TRANSITION: Painful transition from sit to stand and gait antalgia observed with patient using a cane.  TODAY'S TREATMENT:                                                                                                                              DATE: 05/09/23:  Trigger Point Dry-Needling  Treatment instructions: Expect mild to moderate muscle soreness. S/S of pneumothorax if dry needled over a lung field, and to seek immediate medical attention should they occur. Patient verbalized understanding of these instructions and education. Patient Consent Given: Yes Education handout provided: Yes Muscles treated: Left TFL and glut medius. F/b Combo e'stim/US at 1.50 W/CM2 x 12 minutes f/b  STW/M x 11 minutes including ischemic release technique to decrease tone f/b HMP and IFC at 80-150 Hz on 40% scan x 20 minutes.  Normal modality response following removal of modality.  PATIENT EDUCATION:  Education details: Discussed dry needling as a potential intervention and educational and consent form provided Person educated: Patient Education method: Explanation Education comprehension: verbalized understanding  HOME EXERCISE PROGRAM:   ASSESSMENT:  CLINICAL IMPRESSION: Patient with a high pain-level upon presentation to the clinic today.  She did very well with dry needling to her left TFL and glut med and tolerated without complaint.  She reported feeling  better after treatment.  OBJECTIVE IMPAIRMENTS: Abnormal gait, decreased activity tolerance, decreased strength, increased muscle spasms, and pain.   ACTIVITY LIMITATIONS: standing, transfers, and locomotion level  PARTICIPATION LIMITATIONS: meal prep, cleaning, laundry, and community activity  PERSONAL FACTORS: Time since onset of injury/illness/exacerbation and 1 comorbidity: joint replacements  are also affecting patient's functional outcome.   REHAB POTENTIAL: Good  CLINICAL DECISION MAKING: Stable/uncomplicated  EVALUATION COMPLEXITY: Low   GOALS:  LONG TERM GOALS: Target date: 06/14/23  Ind with an HEP.  Goal status: INITIAL  2.  Stand  20 minutes with left hip pain not > 3/10.  Goal status: INITIAL  3.  Sit 20 minutes with left hip pain not > 3/10.   Goal status: INITIAL  PLAN:  PT FREQUENCY: 2x/week  PT DURATION: 6 weeks  PLANNED INTERVENTIONS: 97110-Therapeutic exercises, 97530- Therapeutic activity, [id number]- Neuromuscular re-education, 97535- Self Care, 16109- Manual therapy, 97014- Electrical stimulation (unattended), 97035- Ultrasound, Patient/Family education, Dry Needling, Cryotherapy, and Moist heat  PLAN FOR NEXT SESSION: Combo e'stim/US, STW/M, dry needling.  Resisted exercise to left hip.   Margot Oriordan, [place], PT 05/09/2023, 12:44 PM

## 2023-05-12 ENCOUNTER — Ambulatory Visit: Payer: Medicare Other | Admitting: Physical Therapy

## 2023-05-12 DIAGNOSIS — M25552 Pain in left hip: Secondary | ICD-10-CM

## 2023-05-12 DIAGNOSIS — M62838 Other muscle spasm: Secondary | ICD-10-CM | POA: Diagnosis not present

## 2023-05-12 NOTE — Therapy (Signed)
OUTPATIENT PHYSICAL THERAPY LOWER EXTREMITY EVALUATION   Patient Name: Susan Haney MRN: [id number] DOB:Sep 24, 1931, 89 y.o., female Today's Date: 05/12/2023  END OF SESSION:  PT End of Session - 05/12/23 1341     Visit Number 3    Date for PT Re-Evaluation 06/14/23    Authorization Type FOTO.    PT Start Time 0104    PT Stop Time 0157    PT Time Calculation (min) 53 min    Activity Tolerance Patient tolerated treatment well    Behavior During Therapy WFL for tasks assessed/performed             Past Medical History:  Diagnosis Date   Anemia    history ,  took iron for years   Arthritis    Bicuspid aortic valve    Cancer (HCC) 2017   endometrial cancer / cervical   Cellulitis 12/31/2017   BLE, ankles   Chronic back pain    DDD (degenerative disc disease), lumbar    Endometrial cancer (HCC)    GERD (gastroesophageal reflux disease)    History of radiation therapy 06/15/16-07/06/16   vaginal cuff 30 Gy in 5 fractions   Hypertension    Prediabetes    Spondylosis    lumbar   Past Surgical History:  Procedure Laterality Date   BUNIONECTOMY     CARPAL TUNNEL RELEASE Right 05/30/2018   Procedure: RIGHT CARPAL TUNNEL RELEASE;  Surgeon: Cindee Salt, MD;  Location: [facility];  Service: Orthopedics;  Laterality: Right;   COLONOSCOPY N/A 04/04/2014   Procedure: COLONOSCOPY;  Surgeon: Malissa Hippo, MD;  Location: [facility];  Service: Endoscopy;  Laterality: N/A;  100   EYE SURGERY     bil cataracts and lens implants   FOOT SURGERY     Left   Ovary removed: benign     REPLACEMENT TOTAL KNEE BILATERAL     2013   ROBOTIC ASSISTED TOTAL HYSTERECTOMY WITH BILATERAL SALPINGO OOPHERECTOMY Left 04/13/2016   Procedure: XI ROBOTIC ASSISTED TOTAL HYSTERECTOMY WITH LEFT SALPINGO OOPHORECTOMY SENTINEL LYMPH NODE BIOPSY;  Surgeon: Adolphus Birchwood, MD;  Location: [facility];  Service: Gynecology;  Laterality: Left;   TOTAL KNEE REVISION Left 02/12/2016   Procedure:  LEFT TOTAL KNEE REVISION;  Surgeon: Samson Frederic, MD;  Location: [facility];  Service: Orthopedics;  Laterality: Left;   Patient Active Problem List   Diagnosis Date Noted   Prediabetes 05/14/2022   Chronic maxillary sinusitis 05/14/2022   Seasonal allergies 09/14/2021   Chronic eczema 09/14/2021   Burning sensation of lower extremity 09/14/2021   Degenerative disc disease, cervical 09/14/2021   Chronic idiopathic constipation 06/17/2021   Stage 3b chronic kidney disease (HCC) 05/06/2021   Perianal irritation 01/20/2021   Essential hypertension 08/06/2019   Vitamin D deficiency 08/06/2019   Family history of prostate cancer    Family history of cancer    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  REFERRING DIAG: Trochanteric bursitis of left hip.  THERAPY DIAG:  Pain in left hip  Other muscle spasm  Rationale for Evaluation and Treatment: Rehabilitation  ONSET DATE: Ongoing.  SUBJECTIVE:   SUBJECTIVE STATEMENT: Pain still at an 8.  PERTINENT HISTORY: Please see above. PAIN:  Are you having pain? Yes: NPRS scale: 8/10 Pain location: Left hip. Pain description: Ache,  throb. Aggravating factors: As above. Relieving factors: as above.  PRECAUTIONS: None  RED FLAGS: None   WEIGHT BEARING RESTRICTIONS: No  FALLS:  Has patient fallen in last 6 months? No  LIVING ENVIRONMENT: Lives in: House/apartment Has following equipment at home: Cane.  OCCUPATION: Retired.  PLOF: Independent with basic ADLs  PATIENT GOALS: Decrease left hip pain.   OBJECTIVE:  Note: Objective measures were completed at Evaluation unless otherwise noted.  DIAGNOSTIC FINDINGS: X-ray:  02/15/23:   Mild symmetric degenerative changes in the hips with joint  space narrowing and spurring. No acute bony abnormality. Specifically, no fracture, subluxation, or dislocation. Degenerative changes in the visualized lower lumbar spine.   IMPRESSION: No acute bony abnormality.  PATIENT SURVEYS:  FOTO 40.2   POSTURE:  Equal leg lengths.  PALPATION: Tender to palpation over and posterior to the left greater trochanter and increased tone over left TFL and glu med.  LOWER EXTREMITY ROM:  WNL for left hip.  LOWER EXTREMITY MMT:  Left hip flexion is 4 to 4+/5 and abduction is 5/5.   GAIT/TRANSITION: Painful transition from sit to stand and gait antalgia observed with patient using a cane.  TODAY'S TREATMENT:                                                                                                                              DATE:   05/12/23:  DN to left TFL and glut med f/b combo e'stim/US at 1.50 W/CM2 x 12 minutes f/b  STW/M x 11 minutes including ischemic release technique to decrease tone f/b HMP and IFC at 80-150 Hz on 40% scan x 20 minutes.  Normal modality response following removal of modality.    05/09/23:  Trigger Point Dry-Needling  Treatment instructions: Expect mild to moderate muscle soreness. S/S of pneumothorax if dry needled over a lung field, and to seek immediate medical attention should they occur. Patient verbalized understanding of these instructions and education. Patient Consent Given: Yes Education handout provided: Yes Muscles treated: Left TFL and glut medius. F/b Combo e'stim/US at 1.50 W/CM2 x 12 minutes f/b  STW/M x 11 minutes including ischemic release technique to decrease tone f/b HMP and IFC at 80-150 Hz on 40% scan x 20 minutes.  Normal modality response following removal of modality.  PATIENT EDUCATION:  Education details: Discussed dry needling as a potential intervention and educational and consent form provided Person educated: Patient Education method: Explanation Education comprehension:  verbalized understanding  HOME EXERCISE PROGRAM:   ASSESSMENT:  CLINICAL IMPRESSION: Patient continues with a high pain-level.  She tolerated treatment without complaint today but left feeling about the same.   OBJECTIVE IMPAIRMENTS: Abnormal gait, decreased activity tolerance, decreased strength, increased muscle spasms, and pain.   ACTIVITY LIMITATIONS: standing, transfers, and locomotion level  PARTICIPATION LIMITATIONS: meal prep, cleaning, laundry, and community activity  PERSONAL FACTORS: Time since onset of injury/illness/exacerbation and 1 comorbidity: joint replacements  are also affecting patient's functional outcome.  REHAB POTENTIAL: Good  CLINICAL DECISION MAKING: Stable/uncomplicated  EVALUATION COMPLEXITY: Low   GOALS:  LONG TERM GOALS: Target date: 06/14/23  Ind with an HEP.  Goal status: INITIAL  2.  Stand 20 minutes with left hip pain not > 3/10.  Goal status: INITIAL  3.  Sit 20 minutes with left hip pain not > 3/10.   Goal status: INITIAL  PLAN:  PT FREQUENCY: 2x/week  PT DURATION: 6 weeks  PLANNED INTERVENTIONS: 97110-Therapeutic exercises, 97530- Therapeutic activity, [id number]- Neuromuscular re-education, 97535- Self Care, 32440- Manual therapy, 97014- Electrical stimulation (unattended), 97035- Ultrasound, Patient/Family education, Dry Needling, Cryotherapy, and Moist heat  PLAN FOR NEXT SESSION: Combo e'stim/US, STW/M, dry needling.  Resisted exercise to left hip.   Dayla Gasca, [place], PT 05/12/2023, 2:28 PM

## 2023-05-16 ENCOUNTER — Encounter: Payer: Self-pay | Admitting: *Deleted

## 2023-05-16 ENCOUNTER — Ambulatory Visit: Payer: Medicare Other | Admitting: *Deleted

## 2023-05-16 DIAGNOSIS — M62838 Other muscle spasm: Secondary | ICD-10-CM

## 2023-05-16 DIAGNOSIS — M25552 Pain in left hip: Secondary | ICD-10-CM | POA: Diagnosis not present

## 2023-05-16 NOTE — Therapy (Signed)
OUTPATIENT PHYSICAL THERAPY LOWER EXTREMITY EVALUATION   Patient Name: Susan Haney MRN: [id number] DOB:Nov 28, 1931, 89 y.o., female Today's Date: 05/16/2023  END OF SESSION:  PT End of Session - 05/16/23 1321     Visit Number 4    Number of Visits 12    Date for PT Re-Evaluation 06/14/23    Authorization Type FOTO.    PT Start Time 1100    PT Stop Time 1150    PT Time Calculation (min) 50 min              Past Medical History:  Diagnosis Date   Anemia    history ,  took iron for years   Arthritis    Bicuspid aortic valve    Cancer (HCC) 2017   endometrial cancer / cervical   Cellulitis 12/31/2017   BLE, ankles   Chronic back pain    DDD (degenerative disc disease), lumbar    Endometrial cancer (HCC)    GERD (gastroesophageal reflux disease)    History of radiation therapy 06/15/16-07/06/16   vaginal cuff 30 Gy in 5 fractions   Hypertension    Prediabetes    Spondylosis    lumbar   Past Surgical History:  Procedure Laterality Date   BUNIONECTOMY     CARPAL TUNNEL RELEASE Right 05/30/2018   Procedure: RIGHT CARPAL TUNNEL RELEASE;  Surgeon: Cindee Salt, MD;  Location: [facility];  Service: Orthopedics;  Laterality: Right;   COLONOSCOPY N/A 04/04/2014   Procedure: COLONOSCOPY;  Surgeon: Malissa Hippo, MD;  Location: [facility];  Service: Endoscopy;  Laterality: N/A;  100   EYE SURGERY     bil cataracts and lens implants   FOOT SURGERY     Left   Ovary removed: benign     REPLACEMENT TOTAL KNEE BILATERAL     2013   ROBOTIC ASSISTED TOTAL HYSTERECTOMY WITH BILATERAL SALPINGO OOPHERECTOMY Left 04/13/2016   Procedure: XI ROBOTIC ASSISTED TOTAL HYSTERECTOMY WITH LEFT SALPINGO OOPHORECTOMY SENTINEL LYMPH NODE BIOPSY;  Surgeon: Adolphus Birchwood, MD;  Location: [facility];  Service: Gynecology;  Laterality: Left;   TOTAL KNEE REVISION Left 02/12/2016   Procedure: LEFT TOTAL KNEE REVISION;  Surgeon: Samson Frederic, MD;  Location: [facility];  Service:  Orthopedics;  Laterality: Left;   Patient Active Problem List   Diagnosis Date Noted   Prediabetes 05/14/2022   Chronic maxillary sinusitis 05/14/2022   Seasonal allergies 09/14/2021   Chronic eczema 09/14/2021   Burning sensation of lower extremity 09/14/2021   Degenerative disc disease, cervical 09/14/2021   Chronic idiopathic constipation 06/17/2021   Stage 3b chronic kidney disease (HCC) 05/06/2021   Perianal irritation 01/20/2021   Essential hypertension 08/06/2019   Vitamin D deficiency 08/06/2019   Family history of prostate cancer    Family history of cancer    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  REFERRING DIAG: Trochanteric bursitis of left hip.  THERAPY DIAG:  Pain in left hip  Other muscle spasm  Rationale for Evaluation and Treatment: Rehabilitation  ONSET DATE: Ongoing.  SUBJECTIVE:   SUBJECTIVE STATEMENT: Pain LT hip still 7-8/10. DN didn't help  PERTINENT HISTORY: Please see above. PAIN:  Are you having pain? Yes: NPRS scale: 7-8/10 Pain location: Left hip. Pain description: Ache, throb. Aggravating factors: As above. Relieving factors: as  above.  PRECAUTIONS: None  RED FLAGS: None   WEIGHT BEARING RESTRICTIONS: No  FALLS:  Has patient fallen in last 6 months? No  LIVING ENVIRONMENT: Lives in: House/apartment Has following equipment at home: Cane.  OCCUPATION: Retired.  PLOF: Independent with basic ADLs  PATIENT GOALS: Decrease left hip pain.   OBJECTIVE:  Note: Objective measures were completed at Evaluation unless otherwise noted.  DIAGNOSTIC FINDINGS: X-ray:  02/15/23:   Mild symmetric degenerative changes in the hips with joint space narrowing and spurring. No acute bony abnormality. Specifically,  no fracture, subluxation, or dislocation. Degenerative changes in the visualized lower lumbar spine.   IMPRESSION: No acute bony abnormality.  PATIENT SURVEYS:  FOTO 40.2   POSTURE:  Equal leg lengths.  PALPATION: Tender to palpation over and posterior to the left greater trochanter and increased tone over left TFL and glu med.  LOWER EXTREMITY ROM:  WNL for left hip.  LOWER EXTREMITY MMT:  Left hip flexion is 4 to 4+/5 and abduction is 5/5.   GAIT/TRANSITION: Painful transition from sit to stand and gait antalgia observed with patient using a cane.  TODAY'S TREATMENT:                                                                                                                              DATE:   05/16/23: combo e'stim/US at 1.50 W/CM2 x 12 minutes   STW/M x 12 minutes including ischemic release technique to decrease tone   HMP and IFC at 80-150 Hz on 40% scan x 15 minutes.  Normal modality response following removal of modality.    05/09/23:  Trigger Point Dry-Needling  Treatment instructions: Expect mild to moderate muscle soreness. S/S of pneumothorax if dry needled over a lung field, and to seek immediate medical attention should they occur. Patient verbalized understanding of these instructions and education. Patient Consent Given: Yes Education handout provided: Yes Muscles treated: Left TFL and glut medius. F/b Combo e'stim/US at 1.50 W/CM2 x 12 minutes f/b  STW/M x 11 minutes including ischemic release technique to decrease tone f/b HMP and IFC at 80-150 Hz on 40% scan x 20 minutes.  Normal modality response following removal of modality.  PATIENT EDUCATION:  Education details: Discussed dry needling as a potential intervention and educational and consent form provided Person educated: Patient Education method: Explanation Education comprehension: verbalized understanding  HOME EXERCISE PROGRAM:   ASSESSMENT:  CLINICAL IMPRESSION: Patient  continues with a high pain-level and soreness LT hip/glut. Rx focused on decreasing LT hip pain/soreness with [place] combo, STW/TPR, and IFC and HMP. Pt with notable tenderness around hip jt as well as piriformis area.   She tolerated treatment with complaints of soreness during STW, but felt better end of session.   OBJECTIVE IMPAIRMENTS: Abnormal gait, decreased activity tolerance, decreased strength, increased muscle spasms, and pain.   ACTIVITY LIMITATIONS: standing, transfers, and locomotion level  PARTICIPATION LIMITATIONS: meal prep, cleaning, laundry, and community activity  PERSONAL  FACTORS: Time since onset of injury/illness/exacerbation and 1 comorbidity: joint replacements  are also affecting patient's functional outcome.   REHAB POTENTIAL: Good  CLINICAL DECISION MAKING: Stable/uncomplicated  EVALUATION COMPLEXITY: Low   GOALS:  LONG TERM GOALS: Target date: 06/14/23  Ind with an HEP.  Goal status: INITIAL  2.  Stand 20 minutes with left hip pain not > 3/10.  Goal status: INITIAL  3.  Sit 20 minutes with left hip pain not > 3/10.   Goal status: INITIAL  PLAN:  PT FREQUENCY: 2x/week  PT DURATION: 6 weeks  PLANNED INTERVENTIONS: 97110-Therapeutic exercises, 97530- Therapeutic activity, [id number]- Neuromuscular re-education, 97535- Self Care, [id number]- Manual therapy, 97014- Electrical stimulation (unattended), 97035- Ultrasound, Patient/Family education, Dry Needling, Cryotherapy, and Moist heat  PLAN FOR NEXT SESSION: Combo e'stim/US, STW/M, dry needling.  Resisted exercise to left hip.   Leeta Grimme,CHRIS, PTA 05/16/2023, 1:22 PM

## 2023-05-18 ENCOUNTER — Ambulatory Visit: Payer: Medicare Other | Admitting: Family Medicine

## 2023-05-18 ENCOUNTER — Encounter: Payer: Self-pay | Admitting: Family Medicine

## 2023-05-18 VITALS — BP 107/64 | HR 65 | Temp 98.5°F | Ht 63.0 in | Wt 166.4 lb

## 2023-05-18 DIAGNOSIS — M8580 Other specified disorders of bone density and structure, unspecified site: Secondary | ICD-10-CM

## 2023-05-18 DIAGNOSIS — R7303 Prediabetes: Secondary | ICD-10-CM

## 2023-05-18 DIAGNOSIS — I10 Essential (primary) hypertension: Secondary | ICD-10-CM

## 2023-05-18 DIAGNOSIS — Z0001 Encounter for general adult medical examination with abnormal findings: Secondary | ICD-10-CM

## 2023-05-18 DIAGNOSIS — N1832 Chronic kidney disease, stage 3b: Secondary | ICD-10-CM

## 2023-05-18 DIAGNOSIS — Z0000 Encounter for general adult medical examination without abnormal findings: Secondary | ICD-10-CM

## 2023-05-18 LAB — BAYER DCA HB A1C WAIVED: HB A1C (BAYER DCA - WAIVED): 5.8 % — ABNORMAL HIGH (ref 4.8–5.6)

## 2023-05-18 NOTE — Progress Notes (Signed)
Complete physical exam  Patient: Susan Haney   DOB: Feb 14, 1932   89 y.o. Female  MRN: [id number]  Subjective:    Chief Complaint  Patient presents with   Annual Exam    Susan Haney is a 89 y.o. female who presents today for a complete physical exam. She reports consuming a general diet.  Currently doing PT daily for hip and knee bursitis.  She generally feels poorly due to pain from orthopedic issues. She reports sleeping poorly, again due to pain. She is currently being treated by ortho for bursitis in her hip and knee. She does not have additional problems to discuss today.    She did have some coffee with a little cream and honey about 2 hours ago.   Most recent fall risk assessment:    05/18/2023    8:07 AM  Fall Risk   Falls in the past year? 0     Most recent depression screenings:    05/18/2023    8:07 AM 04/26/2023    9:25 AM  PHQ 2/9 Scores  PHQ - 2 Score 0 0  PHQ- 9 Score 3 0    Vision:Within last year and Dental: No current dental problems and Receives regular dental care  Past Medical History:  Diagnosis Date   Anemia    history ,  took iron for years   Arthritis    Bicuspid aortic valve    Cancer (HCC) 2017   endometrial cancer / cervical   Cellulitis 12/31/2017   BLE, ankles   Chronic back pain    DDD (degenerative disc disease), lumbar    Endometrial cancer (HCC)    GERD (gastroesophageal reflux disease)    History of radiation therapy 06/15/16-07/06/16   vaginal cuff 30 Gy in 5 fractions   Hypertension    Prediabetes    Spondylosis    lumbar      Patient Care Team: Gabriel Earing, FNP as PCP - General (Family Medicine) Delora Fuel, OD (Optometry) Danella Maiers, [facility] as [facility] Care Management (Pharmacist)   Outpatient Medications Prior to Visit  Medication Sig   ACCU-CHEK AVIVA PLUS test strip USE TO CHECK BLOOD SUGARS 1-2 TIMES DAILY. DX E11.9   aspirin EC 81 MG tablet Take 81 mg by mouth daily.    Biotin 1000 MCG tablet Take 1,000 mcg by mouth daily.   cetirizine (ZYRTEC) 10 MG tablet Take 1 tablet (10 mg total) by mouth daily.   Cholecalciferol 50 MCG (2000 UT) CAPS Take 2,000 Units by mouth.   Cyanocobalamin (B-12 PO) Take by mouth.   FARXIGA 10 MG TABS tablet TAKE 1 TABLET BY MOUTH DAILY   fluticasone (FLONASE) 50 MCG/ACT nasal spray Place 2 sprays into both nostrils daily.   lidocaine (LIDODERM) 5 % lidocaine 5 % topical patch  PLACE 1 PATCH ONTO THE SKIN DAILY. REMOVE & DISCARD PATCH WITHIN 12 HOURS OR AS DIRECTED BY MD   lisinopril-hydrochlorothiazide (ZESTORETIC) 20-25 MG tablet TAKE 1 TABLET BY MOUTH DAILY (Patient taking differently: Take 0.5 tablets by mouth daily.)   lubiprostone (AMITIZA) 8 MCG capsule TAKE 1 CAPSULE BY MOUTH  TWICE DAILY WITH A MEAL   Magnesium Oxide (MAG-200 PO) Take by mouth.   montelukast (SINGULAIR) 10 MG tablet TAKE 1 TABLET BY MOUTH AT  BEDTIME   Multiple Vitamins-Minerals (MULTIVITAMIN WITH MINERALS) tablet Take 1 tablet by mouth daily.   nystatin-triamcinolone (MYCOLOG II) cream Apply 1 application topically 2 (two) times daily.   Omega-3 Fatty  Acids (MAXEPA PO) Take 500 mg by mouth daily at 6 (six) AM.   omeprazole (PRILOSEC) 20 MG capsule Take 20 mg by mouth. Takes as needed   psyllium (METAMUCIL) 58.6 % packet Take 1 packet by mouth daily.   triamcinolone cream (KENALOG) 0.5 % APPLY TOPICALLY 3 TIMES A DAY   No facility-administered medications prior to visit.    ROS        Objective:     BP 107/64   Pulse 65   Temp 98.5 F (36.9 C) (Temporal)   Ht 5\' 3"  (1.6 m)   Wt 166 lb 6 oz (75.5 kg)   SpO2 95%   BMI 29.47 kg/m    Physical Exam Vitals and nursing note reviewed.  Constitutional:      General: She is not in acute distress.    Appearance: Normal appearance. She is obese. She is not ill-appearing, toxic-appearing or diaphoretic.  HENT:     Head: Normocephalic.     Right Ear: Tympanic membrane, ear canal and external ear  normal.     Left Ear: Tympanic membrane, ear canal and external ear normal.     Nose: Nose normal.     Mouth/Throat:     Mouth: Mucous membranes are moist.     Pharynx: Oropharynx is clear.  Eyes:     Extraocular Movements: Extraocular movements intact.     Conjunctiva/sclera: Conjunctivae normal.     Pupils: Pupils are equal, round, and reactive to light.  Neck:     Thyroid: No thyroid mass, thyromegaly or thyroid tenderness.  Cardiovascular:     Rate and Rhythm: Normal rate and regular rhythm.     Pulses: Normal pulses.     Heart sounds: Normal heart sounds. No murmur heard.    No friction rub. No gallop.  Pulmonary:     Effort: Pulmonary effort is normal.     Breath sounds: Normal breath sounds.  Abdominal:     General: Bowel sounds are normal. There is no distension.     Palpations: Abdomen is soft. There is no mass.     Tenderness: There is no abdominal tenderness. There is no guarding.  Musculoskeletal:     Cervical back: Normal range of motion and neck supple.     Right lower leg: No edema.     Left lower leg: No edema.  Skin:    General: Skin is warm and dry.     Capillary Refill: Capillary refill takes less than 2 seconds.     Findings: No lesion or rash.  Neurological:     General: No focal deficit present.     Mental Status: She is alert and oriented to person, place, and time.     Cranial Nerves: No cranial nerve deficit.     Motor: No weakness.     Gait: Gait normal.  Psychiatric:        Mood and Affect: Mood normal.        Behavior: Behavior normal.        Thought Content: Thought content normal.        Judgment: Judgment normal.      No results found for any visits on 05/18/23.     Assessment & Plan:    Routine Health Maintenance and Physical Exam  Trystyn was seen today for annual exam.  Diagnoses and all orders for this visit:  Routine general medical examination at a health care facility  Prediabetes A1c pending.  -     Bayer DCA  Hb A1c  Waived  Primary hypertension Well controlled on current regimen. Labs pending. -     CBC with Differential/Platelet -     CMP14+EGFR -     Lipid panel -     TSH  Stage 3b chronic kidney disease (HCC) Avoid NSAIDS. Labs pending. On ACE -     CBC with Differential/Platelet -     CMP14+EGFR -     VITAMIN D 25 Hydroxy (Vit-D Deficiency, Fractures)  Osteopenia, unspecified location Dexa unavailable today. She will return at a later date for this.    Immunization History  Administered Date(s) Administered   Fluad Quad(high Dose 65+) 05/06/2021   Fluad Trivalent(High Dose 65+) 04/26/2023   Influenza, High Dose Seasonal PF 04/23/2017, 05/09/2018, 05/09/2020   Influenza,inj,Quad PF,6+ Mos 04/23/2019   Influenza,inj,quad, With Preservative 04/18/2017   Moderna Covid-19 Vaccine Bivalent Booster 52yrs & up 04/29/2021   Moderna Sars-Covid-2 Vaccination 08/01/2019, 08/29/2019, 03/18/2020, 12/31/2020   Pneumococcal Conjugate-13 07/19/2016   Pneumococcal Polysaccharide-23 07/19/2008   Tdap 02/11/2016   Zoster Recombinant(Shingrix) 11/03/2021    Health Maintenance  Topic Date Due   DEXA SCAN  05/17/2023   Medicare Annual Wellness (AWV)  06/17/2023   COVID-19 Vaccine (6 - 2023-24 season) 06/03/2023 (Originally 03/20/2023)   Zoster Vaccines- Shingrix (2 of 2) 08/18/2023 (Originally 12/29/2021)   DTaP/Tdap/Td (2 - Td or Tdap) 02/10/2026   Pneumonia Vaccine 61+ Years old  Completed   INFLUENZA VACCINE  Completed   HPV VACCINES  Aged Out    Discussed health benefits of physical activity, and encouraged her to engage in regular exercise appropriate for her age and condition.  Problem List Items Addressed This Visit       Cardiovascular and Mediastinum   Primary hypertension   Relevant Orders   CBC with Differential/Platelet   CMP14+EGFR   Lipid panel   TSH     Genitourinary   Stage 3b chronic kidney disease (HCC)   Relevant Orders   CBC with Differential/Platelet   CMP14+EGFR    VITAMIN D 25 Hydroxy (Vit-D Deficiency, Fractures)     Other   Prediabetes   Relevant Orders   Bayer DCA Hb A1c Waived   Other Visit Diagnoses     Routine general medical examination at a health care facility    -  Primary   Osteopenia, unspecified location          Return in about 6 months (around 11/16/2023) for chronic follow up.   The patient indicates understanding of these issues and agrees with the plan.   Gabriel Earing, FNP

## 2023-05-18 NOTE — Patient Instructions (Signed)

## 2023-05-19 ENCOUNTER — Encounter: Payer: Self-pay | Admitting: *Deleted

## 2023-05-19 ENCOUNTER — Ambulatory Visit: Payer: Medicare Other | Admitting: *Deleted

## 2023-05-19 DIAGNOSIS — M25552 Pain in left hip: Secondary | ICD-10-CM | POA: Diagnosis not present

## 2023-05-19 DIAGNOSIS — M62838 Other muscle spasm: Secondary | ICD-10-CM | POA: Diagnosis not present

## 2023-05-19 LAB — LIPID PANEL
Chol/HDL Ratio: 3.1 ratio (ref 0.0–4.4)
Cholesterol, Total: 178 mg/dL (ref 100–199)
HDL: 57 mg/dL (ref >39)
LDL Chol Calc (NIH): 104 mg/dL — ABNORMAL HIGH (ref 0–99)
Triglycerides: 94 mg/dL (ref 0–149)
VLDL Cholesterol Cal: 17 mg/dL (ref 5–40)

## 2023-05-19 LAB — CBC WITH DIFFERENTIAL/PLATELET
Basophils Absolute: 0.1 10*3/uL (ref 0.0–0.2)
Basos: 1 %
EOS (ABSOLUTE): 0.3 10*3/uL (ref 0.0–0.4)
Eos: 5 %
Hematocrit: 37.8 % (ref 34.0–46.6)
Hemoglobin: 11.9 g/dL (ref 11.1–15.9)
Immature Grans (Abs): 0 10*3/uL (ref 0.0–0.1)
Immature Granulocytes: 0 %
Lymphocytes Absolute: 2.2 10*3/uL (ref 0.7–3.1)
Lymphs: 30 %
MCH: 29.3 pg (ref 26.6–33.0)
MCHC: 31.5 g/dL (ref 31.5–35.7)
MCV: 93 fL (ref 79–97)
Monocytes Absolute: 0.7 10*3/uL (ref 0.1–0.9)
Monocytes: 9 %
Neutrophils Absolute: 4 10*3/uL (ref 1.4–7.0)
Neutrophils: 55 %
Platelets: 282 10*3/uL (ref 150–450)
RBC: 4.06 x10E6/uL (ref 3.77–5.28)
RDW: 12.3 % (ref 11.7–15.4)
WBC: 7.3 10*3/uL (ref 3.4–10.8)

## 2023-05-19 LAB — CMP14+EGFR
ALT: 9 [IU]/L (ref 0–32)
AST: 14 [IU]/L (ref 0–40)
Albumin: 4 g/dL (ref 3.7–4.7)
Alkaline Phosphatase: 54 [IU]/L (ref 44–121)
BUN/Creatinine Ratio: 21 (ref 12–28)
BUN: 23 mg/dL (ref 8–27)
Bilirubin Total: 0.3 mg/dL (ref 0.0–1.2)
CO2: 23 mmol/L (ref 20–29)
Calcium: 9.5 mg/dL (ref 8.7–10.3)
Chloride: 103 mmol/L (ref 96–106)
Creatinine, Ser: 1.12 mg/dL — ABNORMAL HIGH (ref 0.57–1.00)
Globulin, Total: 2.4 g/dL (ref 1.5–4.5)
Glucose: 92 mg/dL (ref 70–99)
Potassium: 4 mmol/L (ref 3.5–5.2)
Sodium: 141 mmol/L (ref 134–144)
Total Protein: 6.4 g/dL (ref 6.0–8.5)
eGFR: 47 mL/min/{1.73_m2} — ABNORMAL LOW (ref >59)

## 2023-05-19 LAB — TSH: TSH: 2.81 u[IU]/mL (ref 0.450–4.500)

## 2023-05-19 LAB — VITAMIN D 25 HYDROXY (VIT D DEFICIENCY, FRACTURES): Vit D, 25-Hydroxy: 45.1 ng/mL (ref 30.0–100.0)

## 2023-05-19 NOTE — Therapy (Signed)
OUTPATIENT PHYSICAL THERAPY LOWER EXTREMITY TREATMENT   Patient Name: Susan Haney MRN: [id number] DOB:Jul 25, 1931, 89 y.o., female Today's Date: 05/19/2023  END OF SESSION:  PT End of Session - 05/19/23 1128     Visit Number 5    Number of Visits 12    Date for PT Re-Evaluation 06/14/23    Authorization Type FOTO.    PT Start Time 1103    PT Stop Time 1151    PT Time Calculation (min) 48 min               Past Medical History:  Diagnosis Date   Anemia    history ,  took iron for years   Arthritis    Bicuspid aortic valve    Cancer (HCC) 2017   endometrial cancer / cervical   Cellulitis 12/31/2017   BLE, ankles   Chronic back pain    DDD (degenerative disc disease), lumbar    Endometrial cancer (HCC)    GERD (gastroesophageal reflux disease)    History of radiation therapy 06/15/16-07/06/16   vaginal cuff 30 Gy in 5 fractions   Hypertension    Prediabetes    Spondylosis    lumbar   Past Surgical History:  Procedure Laterality Date   BUNIONECTOMY     CARPAL TUNNEL RELEASE Right 05/30/2018   Procedure: RIGHT CARPAL TUNNEL RELEASE;  Surgeon: Cindee Salt, MD;  Location: [facility];  Service: Orthopedics;  Laterality: Right;   COLONOSCOPY N/A 04/04/2014   Procedure: COLONOSCOPY;  Surgeon: Malissa Hippo, MD;  Location[facility];  Service: Endoscopy;  Laterality: N/A;  100   EYE SURGERY     bil cataracts and lens implants   FOOT SURGERY     Left   Ovary removed: benign     REPLACEMENT TOTAL KNEE BILATERAL     2013   ROBOTIC ASSISTED TOTAL HYSTERECTOMY WITH BILATERAL SALPINGO OOPHERECTOMY Left 04/13/2016   Procedure: XI ROBOTIC ASSISTED TOTAL HYSTERECTOMY WITH LEFT SALPINGO OOPHORECTOMY SENTINEL LYMPH NODE BIOPSY;  Surgeon: Adolphus Birchwood, MD;  Location: [facility];  Service: Gynecology;  Laterality: Left;   TOTAL KNEE REVISION Left 02/12/2016   Procedure: LEFT TOTAL KNEE REVISION;  Surgeon: Samson Frederic, MD;  Location: [facility];  Service:  Orthopedics;  Laterality: Left;   Patient Active Problem List   Diagnosis Date Noted   Prediabetes 05/14/2022   Chronic maxillary sinusitis 05/14/2022   Seasonal allergies 09/14/2021   Chronic eczema 09/14/2021   Burning sensation of lower extremity 09/14/2021   Degenerative disc disease, cervical 09/14/2021   Chronic idiopathic constipation 06/17/2021   Stage 3b chronic kidney disease (HCC) 05/06/2021   Perianal irritation 01/20/2021   Primary hypertension 08/06/2019   Vitamin D deficiency 08/06/2019   Family history of prostate cancer    Family history of cancer    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  REFERRING DIAG: Trochanteric bursitis of left hip.  THERAPY DIAG:  Pain in left hip  Other muscle spasm  Rationale for Evaluation and Treatment: Rehabilitation  ONSET DATE: Ongoing.  SUBJECTIVE:   SUBJECTIVE STATEMENT: Pain LT hip still 8-9/10.   PERTINENT HISTORY: Please see above. PAIN:  Are you having pain? Yes: NPRS scale: 8-9/10 Pain location: Left hip. Pain description: Ache, throb. Aggravating factors: As above. Relieving factors: as above.  PRECAUTIONS: None  RED FLAGS: None   WEIGHT BEARING RESTRICTIONS: No  FALLS:  Has patient fallen in last 6 months? No  LIVING ENVIRONMENT: Lives in: House/apartment Has following equipment at home: Cane.  OCCUPATION: Retired.  PLOF: Independent with basic ADLs  PATIENT GOALS: Decrease left hip pain.   OBJECTIVE:  Note: Objective measures were completed at Evaluation unless otherwise noted.  DIAGNOSTIC FINDINGS: X-ray:  02/15/23:   Mild symmetric degenerative changes in the hips with joint space narrowing and spurring. No acute bony abnormality. Specifically, no fracture,  subluxation, or dislocation. Degenerative changes in the visualized lower lumbar spine.   IMPRESSION: No acute bony abnormality.  PATIENT SURVEYS:  FOTO 40.2   POSTURE:  Equal leg lengths.  PALPATION: Tender to palpation over and posterior to the left greater trochanter and increased tone over left TFL and glu med.  LOWER EXTREMITY ROM:  WNL for left hip.  LOWER EXTREMITY MMT:  Left hip flexion is 4 to 4+/5 and abduction is 5/5.   GAIT/TRANSITION: Painful transition from sit to stand and gait antalgia observed with patient using a cane.  TODAY'S TREATMENT:                                                                                                                              DATE:                                                                           LT hip  05/19/23: combo e'stim/US at 1.50 W/CM2 x 12 minutes to LT hip lateral aspect  STW/M x 12 minutes including ischemic release technique to decrease tone LT hip musculature  HMP and IFC at 80-150 Hz on 40% scan x 15 minutes. Hook lying  Normal modality response following removal of modality.    05/09/23:  Trigger Point Dry-Needling  Treatment instructions: Expect mild to moderate muscle soreness. S/S of pneumothorax if dry needled over a lung field, and to seek immediate medical attention should they occur. Patient verbalized understanding of these instructions and education. Patient Consent Given: Yes Education handout provided: Yes Muscles treated: Left TFL and glut medius. F/b Combo e'stim/US at 1.50 W/CM2 x 12 minutes f/b  STW/M x 11 minutes including ischemic release technique to decrease tone f/b HMP and IFC at 80-150 Hz on 40% scan x 20 minutes.  Normal modality response following removal of modality.  PATIENT EDUCATION:  Education details: Discussed dry needling as a potential intervention and educational and consent form provided Person educated: Patient Education method: Explanation Education  comprehension: verbalized understanding  HOME EXERCISE PROGRAM:   ASSESSMENT:  CLINICAL IMPRESSION: Patient arrived today with a high pain-level and soreness again  LT hip/glut area.  Rx focused on decreasing LT hip pain/soreness with [place] combo, STW/TPR, and IFC and HMP with Pt in semi RT side lying. Pt with notable tenderness around hip jt as well as piriformis area.   She tolerated treatment with complaints of soreness during STW, but felt better end of session.Mainly short term relief with PT sessions   OBJECTIVE IMPAIRMENTS: Abnormal gait, decreased activity tolerance, decreased strength, increased muscle spasms, and pain.   ACTIVITY LIMITATIONS: standing, transfers, and locomotion level  PARTICIPATION LIMITATIONS: meal prep, cleaning, laundry, and community activity  PERSONAL FACTORS: Time since onset of injury/illness/exacerbation and 1 comorbidity: joint replacements  are also affecting patient's functional outcome.   REHAB POTENTIAL: Good  CLINICAL DECISION MAKING: Stable/uncomplicated  EVALUATION COMPLEXITY: Low   GOALS:  LONG TERM GOALS: Target date: 06/14/23  Ind with an HEP.  Goal status: MET  2.  Stand 20 minutes with left hip pain not > 3/10.  Goal status: On going  3.  Sit 20 minutes with left hip pain not > 3/10.   Goal status: On going  PLAN:  PT FREQUENCY: 2x/week  PT DURATION: 6 weeks  PLANNED INTERVENTIONS: 97110-Therapeutic exercises, 97530- Therapeutic activity, [id number]- Neuromuscular re-education, 97535- Self Care, [id number]- Manual therapy, 97014- Electrical stimulation (unattended), 97035- Ultrasound, Patient/Family education, Dry Needling, Cryotherapy, and Moist heat  PLAN FOR NEXT SESSION: Combo e'stim/US, STW/M, dry needling.  Resisted exercise to left hip.   Dequante Tremaine,CHRIS, PTA 05/19/2023, 6:19 PM

## 2023-05-23 ENCOUNTER — Ambulatory Visit: Payer: Medicare Other | Attending: Orthopedic Surgery | Admitting: *Deleted

## 2023-05-23 DIAGNOSIS — M62838 Other muscle spasm: Secondary | ICD-10-CM | POA: Insufficient documentation

## 2023-05-23 DIAGNOSIS — M25552 Pain in left hip: Secondary | ICD-10-CM | POA: Diagnosis not present

## 2023-05-23 NOTE — Therapy (Signed)
OUTPATIENT PHYSICAL THERAPY LOWER EXTREMITY TREATMENT   Patient Name: LAYLAMARIE MEUSER MRN: [id number] DOB:[birth year], 89 y.o., female Today's Date: 05/23/2023  END OF SESSION:  PT End of Session - 05/23/23 0937     Visit Number 6    Number of Visits 12    Date for PT Re-Evaluation 06/14/23    Authorization Type FOTO.    PT Start Time 0930    PT Stop Time 1020    PT Time Calculation (min) 50 min               Past Medical History:  Diagnosis Date   Anemia    history ,  took iron for years   Arthritis    Bicuspid aortic valve    Cancer (HCC) 2017   endometrial cancer / cervical   Cellulitis 12/31/2017   BLE, ankles   Chronic back pain    DDD (degenerative disc disease), lumbar    Endometrial cancer (HCC)    GERD (gastroesophageal reflux disease)    History of radiation therapy 06/15/16-07/06/16   vaginal cuff 30 Gy in 5 fractions   Hypertension    Prediabetes    Spondylosis    lumbar   Past Surgical History:  Procedure Laterality Date   BUNIONECTOMY     CARPAL TUNNEL RELEASE Right 05/30/2018   Procedure: RIGHT CARPAL TUNNEL RELEASE;  Surgeon: Cindee Salt, MD;  Location: [facility];  Service: Orthopedics;  Laterality: Right;   COLONOSCOPY N/A 04/04/2014   Procedure: COLONOSCOPY;  Surgeon: Malissa Hippo, MD;  Location: [facility];  Service: Endoscopy;  Laterality: N/A;  100   EYE SURGERY     bil cataracts and lens implants   FOOT SURGERY     Left   Ovary removed: benign     REPLACEMENT TOTAL KNEE BILATERAL     2013   ROBOTIC ASSISTED TOTAL HYSTERECTOMY WITH BILATERAL SALPINGO OOPHERECTOMY Left 04/13/2016   Procedure: XI ROBOTIC ASSISTED TOTAL HYSTERECTOMY WITH LEFT SALPINGO OOPHORECTOMY SENTINEL LYMPH NODE BIOPSY;  Surgeon: Adolphus Birchwood, MD;  Location: [facility];  Service: Gynecology;  Laterality: Left;   TOTAL KNEE REVISION Left 02/12/2016   Procedure: LEFT TOTAL KNEE REVISION;  Surgeon: Samson Frederic, MD;  Location: [facility];  Service:  Orthopedics;  Laterality: Left;   Patient Active Problem List   Diagnosis Date Noted   Prediabetes 05/14/2022   Chronic maxillary sinusitis 05/14/2022   Seasonal allergies 09/14/2021   Chronic eczema 09/14/2021   Burning sensation of lower extremity 09/14/2021   Degenerative disc disease, cervical 09/14/2021   Chronic idiopathic constipation 06/17/2021   Stage 3b chronic kidney disease (HCC) 05/06/2021   Perianal irritation 01/20/2021   Primary hypertension 08/06/2019   Vitamin D deficiency 08/06/2019   Family history of prostate cancer    Family history of cancer    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  REFERRING DIAG: Trochanteric bursitis of left hip.  THERAPY DIAG:  Pain in left hip  Rationale for Evaluation and Treatment: Rehabilitation  ONSET DATE: Ongoing.  SUBJECTIVE:   SUBJECTIVE STATEMENT: Pain LT hip felt some better after last Rx, but still 8-9/10 today   PERTINENT HISTORY: Please see above. PAIN:  Are you having pain? Yes: NPRS scale: 8-9/10 Pain location: Left hip. Pain description: Ache, throb. Aggravating factors: As above.  Relieving factors: as above.  PRECAUTIONS: None  RED FLAGS: None   WEIGHT BEARING RESTRICTIONS: No  FALLS:  Has patient fallen in last 6 months? No  LIVING ENVIRONMENT: Lives in: House/apartment Has following equipment at home: Cane.  OCCUPATION: Retired.  PLOF: Independent with basic ADLs  PATIENT GOALS: Decrease left hip pain.   OBJECTIVE:  Note: Objective measures were completed at Evaluation unless otherwise noted.  DIAGNOSTIC FINDINGS: X-ray:  02/15/23:   Mild symmetric degenerative changes in the hips with joint space narrowing and spurring. No acute bony abnormality. Specifically,  no fracture, subluxation, or dislocation. Degenerative changes in the visualized lower lumbar spine.   IMPRESSION: No acute bony abnormality.  PATIENT SURVEYS:  FOTO 40.2   POSTURE:  Equal leg lengths.  PALPATION: Tender to palpation over and posterior to the left greater trochanter and increased tone over left TFL and glu med.  LOWER EXTREMITY ROM:  WNL for left hip.  LOWER EXTREMITY MMT:  Left hip flexion is 4 to 4+/5 and abduction is 5/5.   GAIT/TRANSITION: Painful transition from sit to stand and gait antalgia observed with patient using a cane.  TODAY'S TREATMENT:                                                                                                                              DATE:                                                                           LT hip  05-23-23: Combo e'stim/US at 1.50 W/CM2 x 12 minutes to LT hip lateral aspect in hook lying  STW/M x 12 minutes including ischemic release technique to decrease tone LT hip musculature  HMP and IFC at 80-150 Hz on 40% scan x 15 minutes. Hook lying   Normal modality response following removal of modality.    05/09/23:  Trigger Point Dry-Needling  Treatment instructions: Expect mild to moderate muscle soreness. S/S of pneumothorax if dry needled over a lung field, and to seek immediate medical attention should they occur. Patient verbalized understanding of these instructions and education. Patient Consent Given: Yes Education handout provided: Yes Muscles treated: Left TFL and glut medius. F/b Combo e'stim/US at 1.50 W/CM2 x 12 minutes f/b  STW/M x 11 minutes including ischemic release technique to decrease tone f/b HMP and IFC at 80-150 Hz on 40% scan x 20 minutes.  Normal modality response following removal of modality.  PATIENT EDUCATION:  Education details: Discussed dry needling as a potential intervention and educational and consent form provided Person educated: Patient Education method:  Explanation Education comprehension: verbalized understanding  HOME EXERCISE PROGRAM:   ASSESSMENT:  CLINICAL IMPRESSION: Patient  arrived today with a high pain-level and soreness again LT hip/glut area.  Rx focused on decreasing LT hip pain/soreness with [place] combo, STW/TPR, and IFC and HMP with Pt in semi RT side lying. Pt with notable tenderness around hip jt as well as piriformis area.   She tolerated treatment with complaints of soreness during STW, but felt better end of session.Mainly short term relief with PT sessions   OBJECTIVE IMPAIRMENTS: Abnormal gait, decreased activity tolerance, decreased strength, increased muscle spasms, and pain.   ACTIVITY LIMITATIONS: standing, transfers, and locomotion level  PARTICIPATION LIMITATIONS: meal prep, cleaning, laundry, and community activity  PERSONAL FACTORS: Time since onset of injury/illness/exacerbation and 1 comorbidity: joint replacements  are also affecting patient's functional outcome.   REHAB POTENTIAL: Good  CLINICAL DECISION MAKING: Stable/uncomplicated  EVALUATION COMPLEXITY: Low   GOALS:  LONG TERM GOALS: Target date: 06/14/23  Ind with an HEP.  Goal status: MET  2.  Stand 20 minutes with left hip pain not > 3/10.  Goal status: On going  3.  Sit 20 minutes with left hip pain not > 3/10.   Goal status: On going  PLAN:  PT FREQUENCY: 2x/week  PT DURATION: 6 weeks  PLANNED INTERVENTIONS: 97110-Therapeutic exercises, 97530- Therapeutic activity, [id number]- Neuromuscular re-education, 97535- Self Care, [id number]- Manual therapy, 97014- Electrical stimulation (unattended), 97035- Ultrasound, Patient/Family education, Dry Needling, Cryotherapy, and Moist heat  PLAN FOR NEXT SESSION: Combo e'stim/US, STW/M, dry needling.  Resisted exercise to left hip.   Yancy Hascall,CHRIS, PTA 05/23/2023, 1:13 PM

## 2023-05-26 ENCOUNTER — Ambulatory Visit: Payer: Medicare Other | Admitting: *Deleted

## 2023-05-26 ENCOUNTER — Encounter: Payer: Self-pay | Admitting: *Deleted

## 2023-05-26 DIAGNOSIS — B351 Tinea unguium: Secondary | ICD-10-CM | POA: Diagnosis not present

## 2023-05-26 DIAGNOSIS — L84 Corns and callosities: Secondary | ICD-10-CM | POA: Diagnosis not present

## 2023-05-26 DIAGNOSIS — M62838 Other muscle spasm: Secondary | ICD-10-CM | POA: Diagnosis not present

## 2023-05-26 DIAGNOSIS — M25552 Pain in left hip: Secondary | ICD-10-CM | POA: Diagnosis not present

## 2023-05-26 DIAGNOSIS — M79676 Pain in unspecified toe(s): Secondary | ICD-10-CM | POA: Diagnosis not present

## 2023-05-26 DIAGNOSIS — E1142 Type 2 diabetes mellitus with diabetic polyneuropathy: Secondary | ICD-10-CM | POA: Diagnosis not present

## 2023-05-26 NOTE — Therapy (Addendum)
OUTPATIENT PHYSICAL THERAPY LOWER EXTREMITY TREATMENT   Patient Name: Susan Haney MRN: [id number] DOB:1932/03/12, 89 y.o., female Today's Date: 05/26/2023  END OF SESSION:  PT End of Session - 05/26/23 1608     Visit Number 7    Number of Visits 12    Date for PT Re-Evaluation 06/14/23    Authorization Type FOTO.    PT Start Time 1600               Past Medical History:  Diagnosis Date   Anemia    history ,  took iron for years   Arthritis    Bicuspid aortic valve    Cancer (HCC) 2017   endometrial cancer / cervical   Cellulitis 12/31/2017   BLE, ankles   Chronic back pain    DDD (degenerative disc disease), lumbar    Endometrial cancer (HCC)    GERD (gastroesophageal reflux disease)    History of radiation therapy 06/15/16-07/06/16   vaginal cuff 30 Gy in 5 fractions   Hypertension    Prediabetes    Spondylosis    lumbar   Past Surgical History:  Procedure Laterality Date   BUNIONECTOMY     CARPAL TUNNEL RELEASE Right 05/30/2018   Procedure: RIGHT CARPAL TUNNEL RELEASE;  Surgeon: Cindee Salt, MD;  Location: [facility];  Service: Orthopedics;  Laterality: Right;   COLONOSCOPY N/A 04/04/2014   Procedure: COLONOSCOPY;  Surgeon: Malissa Hippo, MD;  [facility];  Service: Endoscopy;  Laterality: N/A;  100   EYE SURGERY     bil cataracts and lens implants   FOOT SURGERY     Left   Ovary removed: benign     REPLACEMENT TOTAL KNEE BILATERAL     2013   ROBOTIC ASSISTED TOTAL HYSTERECTOMY WITH BILATERAL SALPINGO OOPHERECTOMY Left 04/13/2016   Procedure: XI ROBOTIC ASSISTED TOTAL HYSTERECTOMY WITH LEFT SALPINGO OOPHORECTOMY SENTINEL LYMPH NODE BIOPSY;  Surgeon: Adolphus Birchwood, MD;  Location: [facility];  Service: Gynecology;  Laterality: Left;   TOTAL KNEE REVISION Left 02/12/2016   Procedure: LEFT TOTAL KNEE REVISION;  Surgeon: Samson Frederic, MD;  Location: [facility];  Service: Orthopedics;  Laterality: Left;   Patient Active Problem List    Diagnosis Date Noted   Prediabetes 05/14/2022   Chronic maxillary sinusitis 05/14/2022   Seasonal allergies 09/14/2021   Chronic eczema 09/14/2021   Burning sensation of lower extremity 09/14/2021   Degenerative disc disease, cervical 09/14/2021   Chronic idiopathic constipation 06/17/2021   Stage 3b chronic kidney disease (HCC) 05/06/2021   Perianal irritation 01/20/2021   Primary hypertension 08/06/2019   Vitamin D deficiency 08/06/2019   Family history of prostate cancer    Family history of cancer    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  REFERRING DIAG: Trochanteric bursitis of left hip.  THERAPY DIAG:  Pain in left hip  Other muscle spasm  Rationale for Evaluation and Treatment: Rehabilitation  ONSET DATE: Ongoing.  SUBJECTIVE:   SUBJECTIVE STATEMENT: Now both hips are hurting. Put on hold after today and will call MD about getting an injection   PERTINENT HISTORY: Please see above. PAIN:  Are you having pain? Yes: NPRS scale: 8-9/10 Pain location: Left hip. Pain description: Ache, throb. Aggravating factors: As above. Relieving factors: as above.  PRECAUTIONS: None  RED FLAGS: None   WEIGHT BEARING RESTRICTIONS: No  FALLS:  Has patient fallen in last 6 months? No  LIVING ENVIRONMENT: Lives in: House/apartment Has following equipment at home: Cane.  OCCUPATION: Retired.  PLOF: Independent with basic ADLs  PATIENT GOALS: Decrease left hip pain.   OBJECTIVE:  Note: Objective measures were completed at Evaluation unless otherwise noted.  DIAGNOSTIC FINDINGS: X-ray:  02/15/23:   Mild symmetric degenerative changes in the hips with joint space narrowing and spurring. No acute bony abnormality. Specifically, no fracture,  subluxation, or dislocation. Degenerative changes in the visualized lower lumbar spine.   IMPRESSION: No acute bony abnormality.  PATIENT SURVEYS:  FOTO 40.2   POSTURE:  Equal leg lengths.  PALPATION: Tender to palpation over and posterior to the left greater trochanter and increased tone over left TFL and glu med.  LOWER EXTREMITY ROM:  WNL for left hip.  LOWER EXTREMITY MMT:  Left hip flexion is 4 to 4+/5 and abduction is 5/5.   GAIT/TRANSITION: Painful transition from sit to stand and gait antalgia observed with patient using a cane.  TODAY'S TREATMENT:                                                                                                                              DATE:                                                                           LT hip  05-26-23: Combo e'stim/US at 1.50 W/CM2 x 12 minutes to LT hip lateral aspect in hook lying  STW/M x 13 minutes including ischemic release technique to decrease tone LT hip musculature  HMP and IFC at 80-150 Hz on 40% scan x 15 minutes. Hook lying   Normal modality response following removal of modality.    05/09/23:  Trigger Point Dry-Needling  Treatment instructions: Expect mild to moderate muscle soreness. S/S of pneumothorax if dry needled over a lung field, and to seek immediate medical attention should they occur. Patient verbalized understanding of these instructions and education. Patient Consent Given: Yes Education handout provided: Yes Muscles treated: Left TFL and glut medius. F/b Combo e'stim/US at 1.50 W/CM2 x 12 minutes f/b  STW/M x 11 minutes including ischemic release technique to decrease tone f/b HMP and IFC at 80-150 Hz on 40% scan x 20 minutes.  Normal modality response following removal of modality.  PATIENT EDUCATION:  Education details: Discussed dry needling as a potential intervention and educational and consent form provided Person educated: Patient Education method:  Explanation Education comprehension: verbalized understanding  HOME EXERCISE PROGRAM:   ASSESSMENT:  CLINICAL IMPRESSION: Patient arrived today reporting that she is hurting in  both hips today.  Rx focused on pain reduction with STW , [place] combo, as well as IFC. She wants to be put on hold at this time and f/u with MD about possibly getting an injection. Pt with notable tenderness around hip jt as well as piriformis area.   She tolerated treatment with complaints of soreness during STW, but felt better end of session, but Mainly short term relief with PT . 1/3 LTGs MET   OBJECTIVE IMPAIRMENTS: Abnormal gait, decreased activity tolerance, decreased strength, increased muscle spasms, and pain.   ACTIVITY LIMITATIONS: standing, transfers, and locomotion level  PARTICIPATION LIMITATIONS: meal prep, cleaning, laundry, and community activity  PERSONAL FACTORS: Time since onset of injury/illness/exacerbation and 1 comorbidity: joint replacements  are also affecting patient's functional outcome.   REHAB POTENTIAL: Good  CLINICAL DECISION MAKING: Stable/uncomplicated  EVALUATION COMPLEXITY: Low   GOALS:  LONG TERM GOALS: Target date: 06/14/23  Ind with an HEP.  Goal status: MET  2.  Stand 20 minutes with left hip pain not > 3/10.  Goal status: NM due to pain    7-8/10  3.  Sit 20 minutes with left hip pain not > 3/10.   Goal status: NM  due to pain  7-8/10  PLAN:  PT FREQUENCY: 2x/week  PT DURATION: 6 weeks  PLANNED INTERVENTIONS: 97110-Therapeutic exercises, 97530- Therapeutic activity, [id number]- Neuromuscular re-education, 97535- Self Care, 16109- Manual therapy, 97014- Electrical stimulation (unattended), 97035- Ultrasound, Patient/Family education, Dry Needling, Cryotherapy, and Moist heat  PLAN FOR NEXT SESSION: On hold. MD note   Shalaine Payson,CHRIS, PTA 05/26/2023, 4:42 PM   PHYSICAL THERAPY DISCHARGE SUMMARY  Visits from Start of Care: 7.  Current functional level related  to goals / functional outcomes: See above.   Remaining deficits: Continued pain.   Education / Equipment: HEP.   Patient agrees to discharge. Patient goals were not met. Patient is being discharged due to lack of progress.    [place] Applegate MPT

## 2023-06-20 ENCOUNTER — Telehealth: Payer: Self-pay

## 2023-06-20 ENCOUNTER — Ambulatory Visit (INDEPENDENT_AMBULATORY_CARE_PROVIDER_SITE_OTHER): Payer: Medicare Other

## 2023-06-20 VITALS — Ht 63.0 in | Wt 166.0 lb

## 2023-06-20 DIAGNOSIS — Z780 Asymptomatic menopausal state: Secondary | ICD-10-CM | POA: Diagnosis not present

## 2023-06-20 DIAGNOSIS — Z0000 Encounter for general adult medical examination without abnormal findings: Secondary | ICD-10-CM | POA: Diagnosis not present

## 2023-06-20 NOTE — Telephone Encounter (Signed)
Pt aware of provider feedback and voiced understanding. 

## 2023-06-20 NOTE — Progress Notes (Signed)
Subjective:   Susan Haney is a 89 y.o. female who presents for Medicare Annual (Subsequent) preventive examination.  Visit Complete: Virtual I connected with  [name] on 06/20/23 by a audio enabled telemedicine application and verified that I am speaking with the correct person using two identifiers.  Patient Location: Home  Provider Location: Home Office  I discussed the limitations of evaluation and management by telemedicine. The patient expressed understanding and agreed to proceed.  Vital Signs: Because this visit was a virtual/telehealth visit, some criteria may be missing or patient reported. Any vitals not documented were not able to be obtained and vitals that have been documented are patient reported.  Patient Medicare AWV questionnaire was completed by the patient on 06/17/23; I have confirmed that all information answered by patient is correct and no changes since this date.  Cardiac Risk Factors include: advanced age (>7men, >47 women);hypertension     Objective:    Today's Vitals   06/20/23 1048  Weight: 166 lb (75.3 kg)  Height: 5\' 3"  (1.6 m)   Body mass index is 29.41 kg/m.     06/20/2023   10:56 AM 06/16/2022    8:23 AM 06/03/2022    5:17 PM 07/30/2021    9:43 AM 07/07/2021   11:30 AM 05/28/2021    3:47 PM 07/24/2020   10:54 AM  Advanced Directives  Does Patient Have a Medical Advance Directive? No Yes Yes Yes Yes Yes Yes  Type of Furniture conservator/restorer;Living will  Healthcare Power of [employer] of [place];Living will Healthcare Power of [place];Living will  Does patient want to make changes to medical advance directive?  No - Patient declined  No - Patient declined   No - Patient declined  Copy of Healthcare Power of Attorney in Chart?  Yes - validated most recent copy scanned in chart (See row information)  Yes - validated most recent copy scanned in chart (See row information)  No - copy requested  Yes - validated most recent copy scanned in chart (See row information)  Would patient like information on creating a medical advance directive? Yes (MAU/Ambulatory/Procedural Areas - Information given)          Current Medications (verified) Outpatient Encounter Medications as of 06/20/2023  Medication Sig   ACCU-CHEK AVIVA PLUS test strip USE TO CHECK BLOOD SUGARS 1-2 TIMES DAILY. DX E11.9   aspirin EC 81 MG tablet Take 81 mg by mouth daily.   Biotin 1000 MCG tablet Take 1,000 mcg by mouth daily.   cetirizine (ZYRTEC) 10 MG tablet Take 1 tablet (10 mg total) by mouth daily.   Cholecalciferol 50 MCG (2000 UT) CAPS Take 2,000 Units by mouth.   Cyanocobalamin (B-12 PO) Take by mouth.   FARXIGA 10 MG TABS tablet TAKE 1 TABLET BY MOUTH DAILY   fluticasone (FLONASE) 50 MCG/ACT nasal spray Place 2 sprays into both nostrils daily.   lidocaine (LIDODERM) 5 % lidocaine 5 % topical patch  PLACE 1 PATCH ONTO THE SKIN DAILY. REMOVE & DISCARD PATCH WITHIN 12 HOURS OR AS DIRECTED BY MD   lisinopril-hydrochlorothiazide (ZESTORETIC) 20-25 MG tablet TAKE 1 TABLET BY MOUTH DAILY (Patient taking differently: Take 0.5 tablets by mouth daily.)   lubiprostone (AMITIZA) 8 MCG capsule TAKE 1 CAPSULE BY MOUTH  TWICE DAILY WITH A MEAL   Magnesium Oxide (MAG-200 PO) Take by mouth.   montelukast (SINGULAIR) 10 MG tablet TAKE 1 TABLET BY MOUTH AT  BEDTIME  Multiple Vitamins-Minerals (MULTIVITAMIN WITH MINERALS) tablet Take 1 tablet by mouth daily.   nystatin-triamcinolone (MYCOLOG II) cream Apply 1 application topically 2 (two) times daily.   Omega-3 Fatty Acids (MAXEPA PO) Take 500 mg by mouth daily at 6 (six) AM.   omeprazole (PRILOSEC) 20 MG capsule Take 20 mg by mouth. Takes as needed   psyllium (METAMUCIL) 58.6 % packet Take 1 packet by mouth daily.   triamcinolone cream (KENALOG) 0.5 % APPLY TOPICALLY 3 TIMES A DAY   No facility-administered encounter medications on file as of 06/20/2023.    Allergies  (verified) Patient has no known allergies.   History: Past Medical History:  Diagnosis Date   Allergy    Anemia    history ,  took iron for years   Arthritis    Bicuspid aortic valve    Cancer (HCC) 2017   endometrial cancer / cervical   Cataract    Cellulitis 12/31/2017   BLE, ankles   Chronic back pain    Chronic kidney disease    DDD (degenerative disc disease), lumbar    Diabetes mellitus without complication (HCC)    Endometrial cancer (HCC)    GERD (gastroesophageal reflux disease)    History of radiation therapy 06/15/16-07/06/16   vaginal cuff 30 Gy in 5 fractions   Hypertension    Neuromuscular disorder (HCC)    Prediabetes    Spondylosis    lumbar   Past Surgical History:  Procedure Laterality Date   ABDOMINAL HYSTERECTOMY     BUNIONECTOMY     CARPAL TUNNEL RELEASE Right 05/30/2018   Procedure: RIGHT CARPAL TUNNEL RELEASE;  Surgeon: Cindee Salt, MD;  Location: [facility];  Service: Orthopedics;  Laterality: Right;   COLONOSCOPY N/A 04/04/2014   Procedure: COLONOSCOPY;  Surgeon: Malissa Hippo, MD;  [facility];  Service: Endoscopy;  Laterality: N/A;  100   EYE SURGERY     bil cataracts and lens implants   FOOT SURGERY     Left   FRACTURE SURGERY     JOINT REPLACEMENT     Ovary removed: benign     REPLACEMENT TOTAL KNEE BILATERAL     2013   ROBOTIC ASSISTED TOTAL HYSTERECTOMY WITH BILATERAL SALPINGO OOPHERECTOMY Left 04/13/2016   Procedure: XI ROBOTIC ASSISTED TOTAL HYSTERECTOMY WITH LEFT SALPINGO OOPHORECTOMY SENTINEL LYMPH NODE BIOPSY;  Surgeon: Adolphus Birchwood, MD;  Location: [facility];  Service: Gynecology;  Laterality: Left;   TOTAL KNEE REVISION Left 02/12/2016   Procedure: LEFT TOTAL KNEE REVISION;  Surgeon: Samson Frederic, MD;  Location: [facility];  Service: Orthopedics;  Laterality: Left;   Family History  Problem Relation Age of Onset   Prostate cancer Father 61   Arthritis Brother        back issues and knee    Diabetes  Maternal Grandmother    Heart attack Maternal Grandfather    Liver cancer Sister    Cancer Other 37       possibly pancreatic   Cancer Other        possibly uterine or ovarian   Heart disease Son    Diabetes Son    Arthritis Son    Hypertension Son    Hyperlipidemia Son    COPD Son    Social History   Socioeconomic History   Marital status: Widowed    Spouse name: Not on file   Number of children: 2   Years of education: Not on file   Highest education level: Associate degree: occupational,  technical, or vocational program  Occupational History   Occupation: retired  Tobacco Use   Smoking status: Never    Passive exposure: Past   Smokeless tobacco: Never  Vaping Use   Vaping status: Never Used  Substance and Sexual Activity   Alcohol use: No   Drug use: No   Sexual activity: Not Currently    Birth control/protection: None  Other Topics Concern   Not on file  Social History Narrative   2 sons - in [place] and [place]   Disabled sister lives close by   Social Determinants of Health   Financial Resource Strain: Low Risk  (11/14/2022)   Overall Financial Resource Strain (CARDIA)    Difficulty of Paying Living Expenses: Not hard at all  Food Insecurity: No Food Insecurity (11/14/2022)   Hunger Vital Sign    Worried About Running Out of Food in the Last Year: Never true    Ran Out of Food in the Last Year: Never true  Transportation Needs: No Transportation Needs (11/14/2022)   PRAPARE - Administrator, Civil Service (Medical): No    Lack of Transportation (Non-Medical): No  Physical Activity: Sufficiently Active (11/14/2022)   Exercise Vital Sign    Days of Exercise per Week: 6 days    Minutes of Exercise per Session: 40 min  Stress: No Stress Concern Present (11/14/2022)   [employer] of Occupational Health - Occupational Stress Questionnaire    Feeling of Stress : Only a little  Social Connections: Moderately Integrated (11/14/2022)   Social  Connection and Isolation Panel [NHANES]    Frequency of Communication with Friends and Family: More than three times a week    Frequency of Social Gatherings with Friends and Family: Twice a week    Attends Religious Services: More than 4 times per year    Active Member of [employer] or Organizations: Yes    Attends Banker Meetings: More than 4 times per year    Marital Status: Widowed    Tobacco Counseling Counseling given: Not Answered   Clinical Intake:  Pre-visit preparation completed: Yes  Pain : No/denies pain  Diabetes: No  How often do you need to have someone help you when you read instructions, pamphlets, or other written materials from your doctor or pharmacy?: 1 - Never  Interpreter Needed?: No  Information entered by :: Kandis Fantasia LPN   Activities of Daily Living    06/17/2023    9:29 PM  In your present state of health, do you have any difficulty performing the following activities:  Hearing? 0  Vision? 0  Difficulty concentrating or making decisions? 0  Walking or climbing stairs? 1  Dressing or bathing? 0  Doing errands, shopping? 0  Preparing Food and eating ? N  Using the Toilet? N  In the past six months, have you accidently leaked urine? N  Managing your Medications? N  Managing your Finances? N  Housekeeping or managing your Housekeeping? N    Patient Care Team: Gabriel Earing, FNP as PCP - General (Family Medicine) Delora Fuel, OD (Optometry) Cresenciano Genre Lilla Shook, [facility] as [facility] Care Management (Pharmacist) Samson Frederic, MD as Consulting Physician (Orthopedic Surgery) Raquel James, NP as Nurse Practitioner (Gastroenterology)  Indicate any recent Medical Services you may have received from other than [facility] providers in the past year (date may be approximate).     Assessment:   This is a routine wellness examination for Katianne.  Hearing/Vision screen Hearing Screening -  Comments:: Denies hearing  difficulties   Vision Screening - Comments::  up to date with routine eye exams with [facility]    Goals Addressed             This Visit's Progress    Remain active and independent        Depression Screen    05/18/2023    8:07 AM 04/26/2023    9:25 AM 02/08/2023   12:18 PM 11/15/2022    8:14 AM 06/16/2022    8:22 AM 05/14/2022    8:05 AM 04/05/2022    9:47 AM  PHQ 2/9 Scores  PHQ - 2 Score 0 0 0 0 0 0 0  PHQ- 9 Score 3 0 0 0 0 3 6    Fall Risk    06/20/2023   10:53 AM 06/17/2023    9:29 PM 05/18/2023    8:07 AM 04/26/2023    9:25 AM 11/15/2022    8:14 AM  Fall Risk   Falls in the past year? 0 0 0 0 0  Number falls in past yr: 0      Injury with Fall? 0      Risk for fall due to : No Fall Risks      Follow up Falls prevention discussed;Education provided;Falls evaluation completed        MEDICARE RISK AT HOME: Medicare Risk at Home Any stairs in or around the home?: Yes If so, are there any without handrails?: No Home free of loose throw rugs in walkways, pet beds, electrical cords, etc?: Yes Adequate lighting in your home to reduce risk of falls?: Yes Life alert?: Yes Use of a cane, walker or w/c?: Yes Grab bars in the bathroom?: No Shower chair or bench in shower?: Yes Elevated toilet seat or a handicapped toilet?: Yes  TIMED UP AND GO:  Was the test performed?  No    Cognitive Function:      10/27/2022   10:15 AM 05/12/2022    9:24 AM  Montreal Cognitive Assessment   Visuospatial/ Executive (0/5) 5 1  Naming (0/3) 3 3  Attention: Read list of digits (0/2) 2 2  Attention: Read list of letters (0/1) 1 1  Attention: Serial 7 subtraction starting at 100 (0/3) 1 2  Language: Repeat phrase (0/2) 2 2  Language : Fluency (0/1) 1 1  Abstraction (0/2) 2 2  Delayed Recall (0/5) 4 4  Orientation (0/6) 6 6  Total 27 24  Adjusted Score (based on education)  24      06/20/2023   10:56 AM 06/16/2022    8:24 AM 05/28/2021    3:57 PM  6CIT Screen   What Year? 0 points 0 points 0 points  What month? 0 points 0 points 0 points  What time? 0 points 0 points 0 points  Count back from 20 0 points 0 points 0 points  Months in reverse 0 points 0 points 0 points  Repeat phrase 0 points 0 points 2 points  Total Score 0 points 0 points 2 points    Immunizations Immunization History  Administered Date(s) Administered   Fluad Quad(high Dose 65+) 05/06/2021   Fluad Trivalent(High Dose 65+) 04/26/2023   Influenza, High Dose Seasonal PF 04/23/2017, 05/09/2018, 05/09/2020   Influenza,inj,Quad PF,6+ Mos 04/23/2019   Influenza,inj,quad, With Preservative 04/18/2017   Moderna Covid-19 Vaccine Bivalent Booster 52yrs & up 04/29/2021   Moderna Sars-Covid-2 Vaccination 08/01/2019, 08/29/2019, 03/18/2020, 12/31/2020   Pneumococcal Conjugate-13 07/19/2016   Pneumococcal Polysaccharide-23 07/19/2008  Tdap 02/11/2016   Zoster Recombinant(Shingrix) 11/03/2021    TDAP status: Up to date  Flu Vaccine status: Up to date  Pneumococcal vaccine status: Up to date  Covid-19 vaccine status: Information provided on how to obtain vaccines.   Qualifies for Shingles Vaccine? Yes   Zostavax completed No   Shingrix Completed?: No.    Education has been provided regarding the importance of this vaccine. Patient has been advised to call insurance company to determine out of pocket expense if they have not yet received this vaccine. Advised may also receive vaccine at local pharmacy or Health Dept. Verbalized acceptance and understanding.  Screening Tests Health Maintenance  Topic Date Due   COVID-19 Vaccine (6 - 2023-24 season) 03/20/2023   DEXA SCAN  05/17/2023   Zoster Vaccines- Shingrix (2 of 2) 08/18/2023 (Originally 12/29/2021)   Medicare Annual Wellness (AWV)  06/19/2024   DTaP/Tdap/Td (2 - Td or Tdap) 02/10/2026   Pneumonia Vaccine 85+ Years old  Completed   INFLUENZA VACCINE  Completed   HPV VACCINES  Aged Out    Health Maintenance  Health  Maintenance Due  Topic Date Due   COVID-19 Vaccine (6 - 2023-24 season) 03/20/2023   DEXA SCAN  05/17/2023    Colorectal cancer screening: No longer required.   Mammogram status: No longer required due to age and preference.  Bone Density status: Ordered today. Pt provided with contact info and advised to call to schedule appt.  Lung Cancer Screening: (Low Dose CT Chest recommended if Age 71-80 years, 20 pack-year currently smoking OR have quit w/in 15years.) does not qualify.   Lung Cancer Screening Referral: n/a  Additional Screening:  Hepatitis C Screening: does not qualify  Vision Screening: Recommended annual ophthalmology exams for early detection of glaucoma and other disorders of the eye. Is the patient up to date with their annual eye exam?  Yes  Who is the provider or what is the name of the office in which the patient attends annual eye exams? [facility] If pt is not established with a provider, would they like to be referred to a provider to establish care? No .   Dental Screening: Recommended annual dental exams for proper oral hygiene  Community Resource Referral / Chronic Care Management: CRR required this visit?  No   CCM required this visit?  No     Plan:     I have personally reviewed and noted the following in the patient's chart:   Medical and social history Use of alcohol, tobacco or illicit drugs  Current medications and supplements including opioid prescriptions. Patient is not currently taking opioid prescriptions. Functional ability and status Nutritional status Physical activity Advanced directives List of other physicians Hospitalizations, surgeries, and ER visits in previous 12 months Vitals Screenings to include cognitive, depression, and falls Referrals and appointments  In addition, I have reviewed and discussed with patient certain preventive protocols, quality metrics, and best practice recommendations. A written personalized  care plan for preventive services as well as general preventive health recommendations were provided to patient.     Kandis Fantasia [place], [place]   16/07/958   After Visit Summary: (MyChart) Due to this being a telephonic visit, the after visit summary with patients personalized plan was offered to patient via MyChart   Nurse Notes: See telephone note with patient concern

## 2023-06-20 NOTE — Telephone Encounter (Signed)
Recommend following up with ortho for further treatment options.

## 2023-06-20 NOTE — Patient Instructions (Addendum)
Susan Haney , Thank you for taking time to come for your Medicare Wellness Visit. I appreciate your ongoing commitment to your health goals. Please review the following plan we discussed and let me know if I can assist you in the future.   You have an order for:  []   2D Mammogram  []   3D Mammogram  [x]   Bone Density    Referrals/Orders/Follow-Ups/Clinician Recommendations: Aim for 30 minutes of exercise or brisk walking, 6-8 glasses of water, and 5 servings of fruits and vegetables each day.  This is a list of the screening recommended for you and due dates:  Health Maintenance  Topic Date Due   COVID-19 Vaccine (6 - 2023-24 season) 03/20/2023   DEXA scan (bone density measurement)  05/17/2023   Zoster (Shingles) Vaccine (2 of 2) 08/18/2023*   Medicare Annual Wellness Visit  06/19/2024   DTaP/Tdap/Td vaccine (2 - Td or Tdap) 02/10/2026   Pneumonia Vaccine  Completed   Flu Shot  Completed   HPV Vaccine  Aged Out  *Topic was postponed. The date shown is not the original due date.    Advanced directives: (ACP Link)Information on Advanced Care Planning can be found at [facility] [place] Advance Health Care Directives Advance Health Care Directives ([email or web address]   Next Medicare Annual Wellness Visit scheduled for next year: Yes

## 2023-06-20 NOTE — Telephone Encounter (Signed)
Patient seen for AWV and states that she continues to have bilateral hip pain.  She recently completed PT with no relief from pain. Would like to know what other options she has.

## 2023-07-04 ENCOUNTER — Ambulatory Visit: Payer: Medicare Other | Admitting: Psychiatry

## 2023-07-11 DIAGNOSIS — M62838 Other muscle spasm: Secondary | ICD-10-CM | POA: Diagnosis not present

## 2023-07-15 ENCOUNTER — Telehealth: Payer: Self-pay

## 2023-07-15 NOTE — Telephone Encounter (Signed)
Patient states that she was given prednisone for her shoulder at urgent care. She took 3 days and states that she became very constipated and stopped the meds. Package states to contact your Dr if you stop the meds so she wanted to let us  know. Patient is not having any side effects from medication and her constipation has became some better since stopping the med. Advised patient to contact the office with any further needs. Patient verbalized understanding

## 2023-07-15 NOTE — Telephone Encounter (Signed)
Copied from CRM [id number]. Topic: Clinical - Medication Question >> Jul 15, 2023  8:36 AM Geroge Baseman wrote: Reason for CRM: Patient is supposed to be going on a trip this weekend and she needs to ask some question about prednisone. That was given to by urgent care clinic.

## 2023-08-16 DIAGNOSIS — M7062 Trochanteric bursitis, left hip: Secondary | ICD-10-CM | POA: Diagnosis not present

## 2023-08-16 DIAGNOSIS — Z96652 Presence of left artificial knee joint: Secondary | ICD-10-CM | POA: Diagnosis not present

## 2023-08-16 DIAGNOSIS — M545 Low back pain, unspecified: Secondary | ICD-10-CM | POA: Diagnosis not present

## 2023-08-16 DIAGNOSIS — M7632 Iliotibial band syndrome, left leg: Secondary | ICD-10-CM | POA: Diagnosis not present

## 2023-08-17 ENCOUNTER — Other Ambulatory Visit: Payer: Self-pay | Admitting: Family Medicine

## 2023-08-17 ENCOUNTER — Other Ambulatory Visit: Payer: Self-pay | Admitting: *Deleted

## 2023-08-17 ENCOUNTER — Telehealth: Payer: Self-pay | Admitting: Family Medicine

## 2023-08-17 DIAGNOSIS — N1832 Chronic kidney disease, stage 3b: Secondary | ICD-10-CM

## 2023-08-17 DIAGNOSIS — R0981 Nasal congestion: Secondary | ICD-10-CM

## 2023-08-17 MED ORDER — LUBIPROSTONE 8 MCG PO CAPS: 8.0000 ug | ORAL_CAPSULE | Freq: Two times a day (BID) | ORAL | 2 refills | Status: DC

## 2023-08-17 MED ORDER — DAPAGLIFLOZIN PROPANEDIOL 10 MG PO TABS: 10.0000 mg | ORAL_TABLET | Freq: Every day | ORAL | 2 refills | Status: DC

## 2023-08-17 MED ORDER — MONTELUKAST SODIUM 10 MG PO TABS: 10.0000 mg | ORAL_TABLET | Freq: Every day | ORAL | 2 refills | Status: DC

## 2023-08-17 NOTE — Telephone Encounter (Signed)
Patient aware she should not be taking expired medications.

## 2023-08-17 NOTE — Telephone Encounter (Unsigned)
Copied from CRM [id number]. Topic: Clinical - Prescription Issue >> Aug 17, 2023  2:54 PM Carlatta H wrote: Reason for CRM: Patients  montelukast (SINGULAIR) 10 MG tablet Prescription is expired as of yesterday and she would like to know if she should still take it//

## 2023-08-17 NOTE — Telephone Encounter (Signed)
Copied from CRM [id number]. Topic: Clinical - Medication Refill >> Aug 17, 2023  2:50 PM Carlatta H wrote: Most Recent Primary Care Visit:  Provider: Anthoney Harada  Department: [facility]  Visit Type: MEDICARE AWV, SEQUENTIAL  Date: 06/20/2023  Medication: FARXIGA 10 MG TABS tablet [[id number]] lubiprostone (AMITIZA) 8 MCG capsule [[id number]] montelukast (SINGULAIR) 10 MG tablet [[id number]]  Has the patient contacted their pharmacy? No (Agent: If no, request that the patient contact the pharmacy for the refill. If patient does not wish to contact the pharmacy document the reason why and proceed with request.) (Agent: If yes, when and what did the pharmacy advise?)  Is this the correct pharmacy for this prescription? Yes If no, delete pharmacy and type the correct one.  This is the patient's preferred pharmacy:    [facility] - [place], [place] - [address] [place] [address] Phone: [phone] Fax: [phone]   Has the prescription been filled recently? No  Is the patient out of the medication? No  Has the patient been seen for an appointment in the last year OR does the patient have an upcoming appointment? Yes  Can we respond through MyChart? No  Agent: Please be advised that Rx refills may take up to 3 business days. We ask that you follow-up with your pharmacy.

## 2023-08-24 ENCOUNTER — Other Ambulatory Visit: Payer: Self-pay | Admitting: Family Medicine

## 2023-08-24 DIAGNOSIS — K5900 Constipation, unspecified: Secondary | ICD-10-CM | POA: Diagnosis not present

## 2023-08-24 DIAGNOSIS — M4186 Other forms of scoliosis, lumbar region: Secondary | ICD-10-CM | POA: Diagnosis not present

## 2023-08-24 DIAGNOSIS — M51369 Other intervertebral disc degeneration, lumbar region without mention of lumbar back pain or lower extremity pain: Secondary | ICD-10-CM | POA: Diagnosis not present

## 2023-08-24 DIAGNOSIS — I10 Essential (primary) hypertension: Secondary | ICD-10-CM

## 2023-08-24 DIAGNOSIS — G2581 Restless legs syndrome: Secondary | ICD-10-CM | POA: Diagnosis not present

## 2023-08-24 DIAGNOSIS — M48061 Spinal stenosis, lumbar region without neurogenic claudication: Secondary | ICD-10-CM | POA: Diagnosis not present

## 2023-08-24 DIAGNOSIS — M5416 Radiculopathy, lumbar region: Secondary | ICD-10-CM | POA: Diagnosis not present

## 2023-08-24 DIAGNOSIS — I700 Atherosclerosis of aorta: Secondary | ICD-10-CM | POA: Diagnosis not present

## 2023-08-25 DIAGNOSIS — B351 Tinea unguium: Secondary | ICD-10-CM | POA: Diagnosis not present

## 2023-08-25 DIAGNOSIS — M79676 Pain in unspecified toe(s): Secondary | ICD-10-CM | POA: Diagnosis not present

## 2023-08-25 DIAGNOSIS — L84 Corns and callosities: Secondary | ICD-10-CM | POA: Diagnosis not present

## 2023-08-25 DIAGNOSIS — E1142 Type 2 diabetes mellitus with diabetic polyneuropathy: Secondary | ICD-10-CM | POA: Diagnosis not present

## 2023-09-09 DIAGNOSIS — M545 Low back pain, unspecified: Secondary | ICD-10-CM | POA: Diagnosis not present

## 2023-09-09 DIAGNOSIS — M25552 Pain in left hip: Secondary | ICD-10-CM | POA: Diagnosis not present

## 2023-09-14 ENCOUNTER — Encounter: Payer: Self-pay | Admitting: Family Medicine

## 2023-09-14 ENCOUNTER — Ambulatory Visit (INDEPENDENT_AMBULATORY_CARE_PROVIDER_SITE_OTHER): Payer: Medicare Other | Admitting: Family Medicine

## 2023-09-14 ENCOUNTER — Ambulatory Visit: Payer: Self-pay | Admitting: Family Medicine

## 2023-09-14 VITALS — BP 125/66 | HR 100 | Temp 98.9°F | Ht 63.0 in | Wt 164.0 lb

## 2023-09-14 DIAGNOSIS — I10 Essential (primary) hypertension: Secondary | ICD-10-CM | POA: Diagnosis not present

## 2023-09-14 DIAGNOSIS — I952 Hypotension due to drugs: Secondary | ICD-10-CM

## 2023-09-14 DIAGNOSIS — I129 Hypertensive chronic kidney disease with stage 1 through stage 4 chronic kidney disease, or unspecified chronic kidney disease: Secondary | ICD-10-CM | POA: Diagnosis not present

## 2023-09-14 DIAGNOSIS — N1832 Chronic kidney disease, stage 3b: Secondary | ICD-10-CM

## 2023-09-14 DIAGNOSIS — M545 Low back pain, unspecified: Secondary | ICD-10-CM | POA: Diagnosis not present

## 2023-09-14 DIAGNOSIS — M25552 Pain in left hip: Secondary | ICD-10-CM | POA: Diagnosis not present

## 2023-09-14 DIAGNOSIS — D649 Anemia, unspecified: Secondary | ICD-10-CM

## 2023-09-14 MED ORDER — LISINOPRIL 10 MG PO TABS
10.0000 mg | ORAL_TABLET | Freq: Every day | ORAL | 2 refills | Status: DC
Start: 2023-09-14 — End: 2023-09-20

## 2023-09-14 NOTE — Progress Notes (Signed)
 Subjective:  Patient ID: Susan Haney, female    DOB: 10-01-1931, 89 y.o.   MRN: [id number]  Patient Care Team: Gabriel Earing, FNP as PCP - General (Family Medicine) Delora Fuel, OD (Optometry) Cresenciano Genre Lilla Shook, [facility] as [facility] Care Management (Pharmacist) Samson Frederic, MD as Consulting Physician (Orthopedic Surgery) Raquel James, NP as Nurse Practitioner (Gastroenterology)   Chief Complaint:  Hypotension  HPI: Susan Haney is a 89 y.o. female presenting on 09/14/2023 for Hypotension  States that she noticed lower BP about 2 months ago. She was seen by [facility] and decreased dose of lisinopril-hydrochlorothiazide. She is currently taking a half of a pill. She has a wrist cuff at home and averaging 85-107/55-70. States that she was feeling very "jittery" and that was why she was taking her BP so often. She was recently taking hydrocodone due to pain, but she stopped it as she was having side effects of dizziness. Reports that she checks her BG during episodes of jitteriness and it averages 105.   Relevant past medical, surgical, family, and social history reviewed and updated as indicated.  Allergies and medications reviewed and updated. Data reviewed: Chart in Epic.   Past Medical History:  Diagnosis Date   Allergy    Anemia    history ,  took iron for years   Arthritis    Bicuspid aortic valve    Cancer (HCC) 2017   endometrial cancer / cervical   Cataract    Cellulitis 12/31/2017   BLE, ankles   Chronic back pain    Chronic kidney disease    DDD (degenerative disc disease), lumbar    Diabetes mellitus without complication (HCC)    Endometrial cancer (HCC)    GERD (gastroesophageal reflux disease)    History of radiation therapy 06/15/16-07/06/16   vaginal cuff 30 Gy in 5 fractions   Hypertension    Neuromuscular disorder (HCC)    Prediabetes    Spondylosis    lumbar    Past Surgical History:  Procedure Laterality Date   ABDOMINAL  HYSTERECTOMY     BUNIONECTOMY     CARPAL TUNNEL RELEASE Right 05/30/2018   Procedure: RIGHT CARPAL TUNNEL RELEASE;  Surgeon: Cindee Salt, MD;  Location: [facility];  Service: Orthopedics;  Laterality: Right;   COLONOSCOPY N/A 04/04/2014   Procedure: COLONOSCOPY;  Surgeon: Malissa Hippo, MD;  [facility];  Service: Endoscopy;  Laterality: N/A;  100   EYE SURGERY     bil cataracts and lens implants   FOOT SURGERY     Left   FRACTURE SURGERY     JOINT REPLACEMENT     Ovary removed: benign     REPLACEMENT TOTAL KNEE BILATERAL     2013   ROBOTIC ASSISTED TOTAL HYSTERECTOMY WITH BILATERAL SALPINGO OOPHERECTOMY Left 04/13/2016   Procedure: XI ROBOTIC ASSISTED TOTAL HYSTERECTOMY WITH LEFT SALPINGO OOPHORECTOMY SENTINEL LYMPH NODE BIOPSY;  Surgeon: Adolphus Birchwood, MD;  Location: [facility];  Service: Gynecology;  Laterality: Left;   TOTAL KNEE REVISION Left 02/12/2016   Procedure: LEFT TOTAL KNEE REVISION;  Surgeon: Samson Frederic, MD;  Location: [facility];  Service: Orthopedics;  Laterality: Left;    Social History   Socioeconomic History   Marital status: Widowed    Spouse name: Not on file   Number of children: 2   Years of education: Not on file   Highest education level: Associate degree: occupational, Scientist, product/process development, or vocational program  Occupational History  Occupation: retired  Tobacco Use   Smoking status: Never    Passive exposure: Past   Smokeless tobacco: Never  Vaping Use   Vaping status: Never Used  Substance and Sexual Activity   Alcohol use: No   Drug use: No   Sexual activity: Not Currently    Birth control/protection: None  Other Topics Concern   Not on file  Social History Narrative   2 sons - in [place] and [place]   Disabled sister lives close by   Social Drivers of [employer] Strain: Low Risk  (11/14/2022)   Overall Financial Resource Strain (CARDIA)    Difficulty of Paying Living Expenses: Not hard at all  Food  Insecurity: No Food Insecurity (11/14/2022)   Hunger Vital Sign    Worried About Running Out of Food in the Last Year: Never true    Haney Out of Food in the Last Year: Never true  Transportation Needs: No Transportation Needs (11/14/2022)   PRAPARE - Administrator, Civil Service (Medical): No    Lack of Transportation (Non-Medical): No  Physical Activity: Sufficiently Active (11/14/2022)   Exercise Vital Sign    Days of Exercise per Week: 6 days    Minutes of Exercise per Session: 40 min  Stress: No Stress Concern Present (11/14/2022)   [employer] of Occupational Health - Occupational Stress Questionnaire    Feeling of Stress : Only a little  Social Connections: Moderately Integrated (11/14/2022)   Social Connection and Isolation Panel [NHANES]    Frequency of Communication with Friends and Family: More than three times a week    Frequency of Social Gatherings with Friends and Family: Twice a week    Attends Religious Services: More than 4 times per year    Active Member of [employer] or Organizations: Yes    Attends Banker Meetings: More than 4 times per year    Marital Status: Widowed  Intimate Partner Violence: Not At Risk (06/16/2022)   Humiliation, Afraid, Rape, and Kick questionnaire    Fear of Current or Ex-Partner: No    Emotionally Abused: No    Physically Abused: No    Sexually Abused: No    Outpatient Encounter Medications as of 09/14/2023  Medication Sig   ACCU-CHEK AVIVA PLUS test strip USE TO CHECK BLOOD SUGARS 1-2 TIMES DAILY. DX E11.9   aspirin EC 81 MG tablet Take 81 mg by mouth daily.   Biotin 1000 MCG tablet Take 1,000 mcg by mouth daily.   cetirizine (ZYRTEC) 10 MG tablet Take 1 tablet (10 mg total) by mouth daily.   Cholecalciferol 50 MCG (2000 UT) CAPS Take 2,000 Units by mouth.   Cyanocobalamin (B-12 PO) Take by mouth.   dapagliflozin propanediol (FARXIGA) 10 MG TABS tablet Take 1 tablet (10 mg total) by mouth daily.   fluticasone  (FLONASE) 50 MCG/ACT nasal spray Place 2 sprays into both nostrils daily.   lidocaine (LIDODERM) 5 % lidocaine 5 % topical patch  PLACE 1 PATCH ONTO THE SKIN DAILY. REMOVE & DISCARD PATCH WITHIN 12 HOURS OR AS DIRECTED BY MD   lisinopril-hydrochlorothiazide (ZESTORETIC) 20-25 MG tablet TAKE 1 TABLET BY MOUTH DAILY   lubiprostone (AMITIZA) 8 MCG capsule Take 1 capsule (8 mcg total) by mouth 2 (two) times daily with a meal.   Magnesium Oxide (MAG-200 PO) Take by mouth.   montelukast (SINGULAIR) 10 MG tablet Take 1 tablet (10 mg total) by mouth at bedtime.   Multiple Vitamins-Minerals (MULTIVITAMIN WITH  MINERALS) tablet Take 1 tablet by mouth daily.   nystatin-triamcinolone (MYCOLOG II) cream Apply 1 application topically 2 (two) times daily.   Omega-3 Fatty Acids (MAXEPA PO) Take 500 mg by mouth daily at 6 (six) AM.   omeprazole (PRILOSEC) 20 MG capsule Take 20 mg by mouth. Takes as needed   psyllium (METAMUCIL) 58.6 % packet Take 1 packet by mouth daily.   triamcinolone cream (KENALOG) 0.5 % APPLY TOPICALLY 3 TIMES A DAY   No facility-administered encounter medications on file as of 09/14/2023.    No Known Allergies  Review of Systems As per HPI  Objective:  BP 125/66   Pulse 100   Temp 98.9 F (37.2 C)   Ht 5\' 3"  (1.6 m)   Wt 164 lb (74.4 kg)   SpO2 96%   BMI 29.05 kg/m    Wt Readings from Last 3 Encounters:  09/14/23 164 lb (74.4 kg)  06/20/23 166 lb (75.3 kg)  05/18/23 166 lb 6 oz (75.5 kg)   Physical Exam Constitutional:      General: She is awake. She is not in acute distress.    Appearance: Normal appearance. She is well-developed and well-groomed. She is obese. She is not ill-appearing, toxic-appearing or diaphoretic.  Cardiovascular:     Rate and Rhythm: Normal rate and regular rhythm.     Pulses: Normal pulses.          Radial pulses are 2+ on the right side and 2+ on the left side.       Posterior tibial pulses are 2+ on the right side and 2+ on the left side.      Heart sounds: Normal heart sounds. No murmur heard.    No gallop.  Pulmonary:     Effort: Pulmonary effort is normal. No respiratory distress.     Breath sounds: Normal breath sounds. No stridor. No wheezing, rhonchi or rales.  Musculoskeletal:     Cervical back: Full passive range of motion without pain and neck supple.     Right lower leg: No edema.     Left lower leg: No edema.     Comments: Generalized weakness, walks with cane   Skin:    General: Skin is warm.     Capillary Refill: Capillary refill takes less than 2 seconds.  Neurological:     General: No focal deficit present.     Mental Status: She is alert, oriented to person, place, and time and easily aroused. Mental status is at baseline.     GCS: GCS eye subscore is 4. GCS verbal subscore is 5. GCS motor subscore is 6.     Motor: No weakness.  Psychiatric:        Attention and Perception: Attention and perception normal.        Mood and Affect: Mood and affect normal.        Speech: Speech normal.        Behavior: Behavior normal. Behavior is cooperative.        Thought Content: Thought content normal. Thought content does not include homicidal or suicidal ideation. Thought content does not include homicidal or suicidal plan.        Cognition and Memory: Cognition and memory normal.        Judgment: Judgment normal.     Results for orders placed or performed in visit on 05/18/23  Bayer DCA Hb A1c Waived   Collection Time: 05/18/23  8:29 AM  Result Value Ref Range   HB A1C (  BAYER DCA - WAIVED) 5.8 (H) 4.8 - 5.6 %  CBC with Differential/Platelet   Collection Time: 05/18/23  8:30 AM  Result Value Ref Range   WBC 7.3 3.4 - 10.8 x10E3/uL   RBC 4.06 3.77 - 5.28 x10E6/uL   Hemoglobin 11.9 11.1 - 15.9 g/dL   Hematocrit 59.5 63.8 - 46.6 %   MCV 93 79 - 97 fL   MCH 29.3 26.6 - 33.0 pg   MCHC 31.5 31.5 - 35.7 g/dL   RDW 75.6 43.3 - 29.5 %   Platelets 282 150 - 450 x10E3/uL   Neutrophils 55 Not Estab. %   Lymphs 30 Not  Estab. %   Monocytes 9 Not Estab. %   Eos 5 Not Estab. %   Basos 1 Not Estab. %   Neutrophils Absolute 4.0 1.4 - 7.0 x10E3/uL   Lymphocytes Absolute 2.2 0.7 - 3.1 x10E3/uL   Monocytes Absolute 0.7 0.1 - 0.9 x10E3/uL   EOS (ABSOLUTE) 0.3 0.0 - 0.4 x10E3/uL   Basophils Absolute 0.1 0.0 - 0.2 x10E3/uL   Immature Granulocytes 0 Not Estab. %   Immature Grans (Abs) 0.0 0.0 - 0.1 x10E3/uL  CMP14+EGFR   Collection Time: 05/18/23  8:30 AM  Result Value Ref Range   Glucose 92 70 - 99 mg/dL   BUN 23 8 - 27 mg/dL   Creatinine, Ser 1.88 (H) 0.57 - 1.00 mg/dL   eGFR 47 (L) >41 YS/AYT/0.16   BUN/Creatinine Ratio 21 12 - 28   Sodium 141 134 - 144 mmol/L   Potassium 4.0 3.5 - 5.2 mmol/L   Chloride 103 96 - 106 mmol/L   CO2 23 20 - 29 mmol/L   Calcium 9.5 8.7 - 10.3 mg/dL   Total Protein 6.4 6.0 - 8.5 g/dL   Albumin 4.0 3.7 - 4.7 g/dL   Globulin, Total 2.4 1.5 - 4.5 g/dL   Bilirubin Total 0.3 0.0 - 1.2 mg/dL   Alkaline Phosphatase 54 44 - 121 IU/L   AST 14 0 - 40 IU/L   ALT 9 0 - 32 IU/L  Lipid panel   Collection Time: 05/18/23  8:30 AM  Result Value Ref Range   Cholesterol, Total 178 100 - 199 mg/dL   Triglycerides 94 0 - 149 mg/dL   HDL 57 >01 mg/dL   VLDL Cholesterol Cal 17 5 - 40 mg/dL   LDL Chol Calc (NIH) 093 (H) 0 - 99 mg/dL   Chol/HDL Ratio 3.1 0.0 - 4.4 ratio  TSH   Collection Time: 05/18/23  8:30 AM  Result Value Ref Range   TSH 2.810 0.450 - 4.500 uIU/mL  VITAMIN D 25 Hydroxy (Vit-D Deficiency, Fractures)   Collection Time: 05/18/23  8:30 AM  Result Value Ref Range   Vit D, 25-Hydroxy 45.1 30.0 - 100.0 ng/mL       09/14/2023    4:06 PM 05/18/2023    8:07 AM 04/26/2023    9:25 AM 02/08/2023   12:18 PM 11/15/2022    8:14 AM  Depression screen PHQ 2/9  Decreased Interest 0 0 0 0 0  Down, Depressed, Hopeless 0 0 0 0 0  PHQ - 2 Score 0 0 0 0 0  Altered sleeping 1 3 0 0 0  Tired, decreased energy 0 0 0 0 0  Change in appetite 0 0 0 0 0  Feeling bad or failure about  yourself  0 0 0 0 0  Trouble concentrating 0 0 0 0 0  Moving slowly or fidgety/restless 0 0  0 0 0  Suicidal thoughts 0 0 0 0 0  PHQ-9 Score 1 3 0 0 0  Difficult doing work/chores  Somewhat difficult Not difficult at all Not difficult at all Not difficult at all       09/14/2023    4:06 PM 05/18/2023    8:08 AM 04/26/2023    9:25 AM 02/08/2023   12:21 PM  GAD 7 : Generalized Anxiety Score  Nervous, Anxious, on Edge 1 1 0 0  Control/stop worrying 0 0 0 0  Worry too much - different things 0 0 0 0  Trouble relaxing 0 0 0 0  Restless 0 0 0 0  Easily annoyed or irritable 0 0 0 0  Afraid - awful might happen 0 0 0 0  Total GAD 7 Score 1 1 0 0  Anxiety Difficulty Somewhat difficult Not difficult at all Not difficult at all Not difficult at all   Pertinent labs & imaging results that were available during my care of the patient were reviewed by me and considered in my medical decision making.  Assessment & Plan:  Eveleigh was seen today for hypotension.  Diagnoses and all orders for this visit: 1. Hypotension due to drugs (Primary) Will stop lisinopril-hydrochlorothiazide and start lisinopril only given CKD. Patient to continue to monitor BP at home with cuff. Bring cuff to triage RN in 2 weeks with home log to follow up on BP and to make sure BP is accurate. Labs as below. Will communicate results to patient once available. Will await results to determine next steps.  - CBC with Differential/Platelet - CMP14+EGFR - TSH - lisinopril (ZESTRIL) 10 MG tablet; Take 1 tablet (10 mg total) by mouth daily.  Dispense: 30 tablet; Refill: 2  2. Primary hypertension As above.  - CBC with Differential/Platelet - CMP14+EGFR - TSH - lisinopril (ZESTRIL) 10 MG tablet; Take 1 tablet (10 mg total) by mouth daily.  Dispense: 30 tablet; Refill: 2  3. Stage 3b chronic kidney disease (HCC) As above.  - CBC with Differential/Platelet - CMP14+EGFR - TSH - lisinopril (ZESTRIL) 10 MG tablet; Take 1  tablet (10 mg total) by mouth daily.  Dispense: 30 tablet; Refill: 2   Continue all other maintenance medications.  Follow up plan: Return for w/ PCP in April .   Continue healthy lifestyle choices, including diet (rich in fruits, vegetables, and lean proteins, and low in salt and simple carbohydrates) and exercise (at least 30 minutes of moderate physical activity daily).  Written and verbal instructions provided   The above assessment and management plan was discussed with the patient. The patient verbalized understanding of and has agreed to the management plan. Patient is aware to call the clinic if they develop any new symptoms or if symptoms persist or worsen. Patient is aware when to return to the clinic for a follow-up visit. Patient educated on when it is appropriate to go to the emergency department.   Neale Burly, DNP-FNP [facility] [address] [place], [place] [address] [phone]

## 2023-09-14 NOTE — Telephone Encounter (Signed)
 Copied from CRM [id number]. Topic: Clinical - Red Word Triage >> Sep 14, 2023  2:00 PM Dondra Prader E wrote: [facility] that prompted transfer to Nurse Triage: Blood pressure 85/52  Chief Complaint: Low BP Symptoms: Jitteriness Frequency: Past 2 weeks Pertinent Negatives: Patient denies dizziness Disposition: [] ED /[] Urgent Care (no appt availability in office) / [x] Appointment(In office/virtual)/ []  [facility]/ [] Home Care/ [] Refused Recommended Disposition /[] [facility]/ []  Follow-up with PCP Additional Notes: Patient called in to report low BP. Patient took her BP right before this call and it measured 85/52, with a HR of 91. Patient stated that her BP has been fluctuating for a few months, since adjusting her BP medication at a recent appointment. Patient stated she is jittery, but denied dizziness and lightheadedness at this time. This RN advised patient to be seen within 4 hours, per protocol. No availability with PCP. This RN scheduled same day appointment with DOD in office. This RN advised patient to call back if symptoms worsen. Patient complied.   Reason for Disposition  [1] Systolic BP < 90 AND [2] NOT dizzy, lightheaded or weak  Answer Assessment - Initial Assessment Questions 1. BLOOD PRESSURE: "What is the blood pressure?" "Did you take at least two measurements 5 minutes apart?"     85/52 + HR 91 taken right before this call, 100/57 + HR 71 yesterday 2. ONSET: "When did you take your blood pressure?"     States BP has been fluctuating for several months, low readings started around December 3. HOW: "How did you obtain the blood pressure?" (e.g., visiting nurse, automatic home BP monitor)     At home with BP monitor 4. HISTORY: "Do you have a history of low blood pressure?" "What is your blood pressure normally?"     States she does not have a history of low BP 5. MEDICINES: "Are you taking any medications for blood pressure?" If Yes, ask: "Have they been  changed recently?"     Lisinopril- recently started taking half a pill 6. PULSE RATE: "Do you know what your pulse rate is?"      HR 91 right before this call 7. OTHER SYMPTOMS: "Have you been sick recently?" "Have you had a recent injury?"     States she feels jittery, not sleeping well, recent problems with hip and back, denies dizziness, denies lightheadedness  Protocols used: Blood Pressure - Low-A-AH

## 2023-09-15 ENCOUNTER — Encounter: Payer: Self-pay | Admitting: Family Medicine

## 2023-09-15 LAB — CMP14+EGFR
ALT: 9 [IU]/L (ref 0–32)
AST: 18 [IU]/L (ref 0–40)
Albumin: 4 g/dL (ref 3.7–4.7)
Alkaline Phosphatase: 59 [IU]/L (ref 44–121)
BUN/Creatinine Ratio: 24 (ref 12–28)
BUN: 29 mg/dL — ABNORMAL HIGH (ref 8–27)
Bilirubin Total: <0.2 mg/dL (ref 0.0–1.2)
CO2: 22 mmol/L (ref 20–29)
Calcium: 9.3 mg/dL (ref 8.7–10.3)
Chloride: 105 mmol/L (ref 96–106)
Creatinine, Ser: 1.23 mg/dL — ABNORMAL HIGH (ref 0.57–1.00)
Globulin, Total: 2.3 g/dL (ref 1.5–4.5)
Glucose: 99 mg/dL (ref 70–99)
Potassium: 4.2 mmol/L (ref 3.5–5.2)
Sodium: 143 mmol/L (ref 134–144)
Total Protein: 6.3 g/dL (ref 6.0–8.5)
eGFR: 42 mL/min/{1.73_m2} — ABNORMAL LOW (ref >59)

## 2023-09-15 LAB — CBC WITH DIFFERENTIAL/PLATELET
Basophils Absolute: 0 10*3/uL (ref 0.0–0.2)
Basos: 1 %
EOS (ABSOLUTE): 0.2 10*3/uL (ref 0.0–0.4)
Eos: 3 %
Hematocrit: 34.9 % (ref 34.0–46.6)
Hemoglobin: 10.9 g/dL — ABNORMAL LOW (ref 11.1–15.9)
Immature Grans (Abs): 0 10*3/uL (ref 0.0–0.1)
Immature Granulocytes: 0 %
Lymphocytes Absolute: 1.6 10*3/uL (ref 0.7–3.1)
Lymphs: 22 %
MCH: 27.4 pg (ref 26.6–33.0)
MCHC: 31.2 g/dL — ABNORMAL LOW (ref 31.5–35.7)
MCV: 88 fL (ref 79–97)
Monocytes Absolute: 0.8 10*3/uL (ref 0.1–0.9)
Monocytes: 10 %
Neutrophils Absolute: 4.8 10*3/uL (ref 1.4–7.0)
Neutrophils: 64 %
Platelets: 325 10*3/uL (ref 150–450)
RBC: 3.98 x10E6/uL (ref 3.77–5.28)
RDW: 13 % (ref 11.7–15.4)
WBC: 7.4 10*3/uL (ref 3.4–10.8)

## 2023-09-15 LAB — TSH: TSH: 1.91 u[IU]/mL (ref 0.450–4.500)

## 2023-09-15 NOTE — Addendum Note (Signed)
 Addended by: Neale Burly on: 09/15/2023 10:15 AM   Modules accepted: Orders

## 2023-09-15 NOTE — Progress Notes (Signed)
 Slightly anemic, would like for patient to return in one week to complete anemia panel. CMP stable. Recommend patient follow up with PCP.

## 2023-09-19 ENCOUNTER — Other Ambulatory Visit

## 2023-09-19 DIAGNOSIS — D649 Anemia, unspecified: Secondary | ICD-10-CM

## 2023-09-20 ENCOUNTER — Ambulatory Visit (INDEPENDENT_AMBULATORY_CARE_PROVIDER_SITE_OTHER): Admitting: Family Medicine

## 2023-09-20 ENCOUNTER — Encounter: Payer: Self-pay | Admitting: Family Medicine

## 2023-09-20 VITALS — BP 148/64 | HR 76 | Temp 98.5°F | Ht 63.0 in | Wt 167.0 lb

## 2023-09-20 DIAGNOSIS — I10 Essential (primary) hypertension: Secondary | ICD-10-CM

## 2023-09-20 DIAGNOSIS — N1832 Chronic kidney disease, stage 3b: Secondary | ICD-10-CM

## 2023-09-20 DIAGNOSIS — R600 Localized edema: Secondary | ICD-10-CM | POA: Diagnosis not present

## 2023-09-20 DIAGNOSIS — D509 Iron deficiency anemia, unspecified: Secondary | ICD-10-CM | POA: Diagnosis not present

## 2023-09-20 LAB — ANEMIA PROFILE B
Basophils Absolute: 0.1 10*3/uL (ref 0.0–0.2)
Basos: 1 %
EOS (ABSOLUTE): 0.2 10*3/uL (ref 0.0–0.4)
Eos: 3 %
Ferritin: 23 ng/mL (ref 15–150)
Folate: 13.6 ng/mL (ref >3.0)
Hematocrit: 33.4 % — ABNORMAL LOW (ref 34.0–46.6)
Hemoglobin: 10.6 g/dL — ABNORMAL LOW (ref 11.1–15.9)
Immature Grans (Abs): 0 10*3/uL (ref 0.0–0.1)
Immature Granulocytes: 0 %
Iron Saturation: 8 % — CL (ref 15–55)
Iron: 28 ug/dL (ref 27–139)
Lymphocytes Absolute: 2.6 10*3/uL (ref 0.7–3.1)
Lymphs: 35 %
MCH: 27.7 pg (ref 26.6–33.0)
MCHC: 31.7 g/dL (ref 31.5–35.7)
MCV: 87 fL (ref 79–97)
Monocytes Absolute: 0.6 10*3/uL (ref 0.1–0.9)
Monocytes: 9 %
Neutrophils Absolute: 3.9 10*3/uL (ref 1.4–7.0)
Neutrophils: 52 %
Platelets: 327 10*3/uL (ref 150–450)
RBC: 3.83 x10E6/uL (ref 3.77–5.28)
RDW: 13.1 % (ref 11.7–15.4)
Retic Ct Pct: 1.1 % (ref 0.6–2.6)
Total Iron Binding Capacity: 356 ug/dL (ref 250–450)
UIBC: 328 ug/dL (ref 118–369)
Vitamin B-12: 728 pg/mL (ref 232–1245)
WBC: 7.4 10*3/uL (ref 3.4–10.8)

## 2023-09-20 MED ORDER — LISINOPRIL-HYDROCHLOROTHIAZIDE 20-25 MG PO TABS: 0.5000 | ORAL_TABLET | Freq: Every day | ORAL | Status: DC

## 2023-09-20 NOTE — Patient Instructions (Signed)
Ferrous sulfate 325 mg every other day with Loma juice

## 2023-09-20 NOTE — Progress Notes (Signed)
 Discussed with patient in office today. Start supplementation 325 mg of ferrous sulfate at least every other day.

## 2023-09-20 NOTE — Progress Notes (Signed)
 Subjective:  Patient ID: Susan Haney, female    DOB: [birth year], 89 y.o.   MRN: [id number]  Patient Care Team: Gabriel Earing, FNP as PCP - General (Family Medicine) Delora Fuel, OD (Optometry) Cresenciano Genre Lilla Shook, [facility] as [facility] Care Management (Pharmacist) Samson Frederic, MD as Consulting Physician (Orthopedic Surgery) Raquel James, NP as Nurse Practitioner (Gastroenterology)   Chief Complaint:  Leg Swelling (Bilateral legs and feet)   HPI: Susan Haney is a 89 y.o. female presenting on 09/20/2023 for Leg Swelling (Bilateral legs and feet) Reports that since changing her medications last week that she has had bilateral lower extremity swelling. Reports soreness at the site. She continues to monitor her BP at home. Reports 120-133/80. States that she has always been in the 70s diastolic and 80s is concerning her.  Denies shortness of breath or dizziness.   Relevant past medical, surgical, family, and social history reviewed and updated as indicated.  Allergies and medications reviewed and updated. Data reviewed: Chart in Epic.   Past Medical History:  Diagnosis Date   Allergy    Anemia    history ,  took iron for years   Arthritis    Bicuspid aortic valve    Cancer (HCC) 2017   endometrial cancer / cervical   Cataract    Cellulitis 12/31/2017   BLE, ankles   Chronic back pain    Chronic kidney disease    DDD (degenerative disc disease), lumbar    Diabetes mellitus without complication (HCC)    Endometrial cancer (HCC)    GERD (gastroesophageal reflux disease)    History of radiation therapy 06/15/16-07/06/16   vaginal cuff 30 Gy in 5 fractions   Hypertension    Neuromuscular disorder (HCC)    Prediabetes    Spondylosis    lumbar    Past Surgical History:  Procedure Laterality Date   ABDOMINAL HYSTERECTOMY     BUNIONECTOMY     CARPAL TUNNEL RELEASE Right 05/30/2018   Procedure: RIGHT CARPAL TUNNEL RELEASE;  Surgeon: Cindee Salt,  MD;  Location: [facility];  Service: Orthopedics;  Laterality: Right;   COLONOSCOPY N/A 04/04/2014   Procedure: COLONOSCOPY;  Surgeon: Malissa Hippo, MD;  Location: [facility];  Service: Endoscopy;  Laterality: N/A;  100   EYE SURGERY     bil cataracts and lens implants   FOOT SURGERY     Left   FRACTURE SURGERY     JOINT REPLACEMENT     Ovary removed: benign     REPLACEMENT TOTAL KNEE BILATERAL     2013   ROBOTIC ASSISTED TOTAL HYSTERECTOMY WITH BILATERAL SALPINGO OOPHERECTOMY Left 04/13/2016   Procedure: XI ROBOTIC ASSISTED TOTAL HYSTERECTOMY WITH LEFT SALPINGO OOPHORECTOMY SENTINEL LYMPH NODE BIOPSY;  Surgeon: Adolphus Birchwood, MD;  Location: [facility];  Service: Gynecology;  Laterality: Left;   TOTAL KNEE REVISION Left 02/12/2016   Procedure: LEFT TOTAL KNEE REVISION;  Surgeon: Samson Frederic, MD;  Location: [facility];  Service: Orthopedics;  Laterality: Left;    Social History   Socioeconomic History   Marital status: Widowed    Spouse name: Not on file   Number of children: 2   Years of education: Not on file   Highest education level: Associate degree: occupational, Scientist, product/process development, or vocational program  Occupational History   Occupation: retired  Tobacco Use   Smoking status: Never    Passive exposure: Past   Smokeless tobacco: Never  Vaping Use   Vaping  status: Never Used  Substance and Sexual Activity   Alcohol use: No   Drug use: No   Sexual activity: Not Currently    Birth control/protection: None  Other Topics Concern   Not on file  Social History Narrative   2 sons - in [place] and [place]   Disabled sister lives close by   Social Drivers of Health   Financial Resource Strain: Low Risk  (09/19/2023)   Overall Financial Resource Strain (CARDIA)    Difficulty of Paying Living Expenses: Not hard at all  Food Insecurity: No Food Insecurity (09/19/2023)   Hunger Vital Sign    Worried About Running Out of Food in the Last Year: Never true    Haney Out of  Food in the Last Year: Never true  Transportation Needs: No Transportation Needs (09/19/2023)   PRAPARE - Administrator, Civil Service (Medical): No    Lack of Transportation (Non-Medical): No  Physical Activity: Insufficiently Active (09/19/2023)   Exercise Vital Sign    Days of Exercise per Week: 4 days    Minutes of Exercise per Session: 30 min  Stress: No Stress Concern Present (09/19/2023)   [employer] of Occupational Health - Occupational Stress Questionnaire    Feeling of Stress : Only a little  Social Connections: Moderately Integrated (09/19/2023)   Social Connection and Isolation Panel [NHANES]    Frequency of Communication with Friends and Family: More than three times a week    Frequency of Social Gatherings with Friends and Family: Once a week    Attends Religious Services: More than 4 times per year    Active Member of [employer] or Organizations: Yes    Attends Banker Meetings: More than 4 times per year    Marital Status: Widowed  Intimate Partner Violence: Not At Risk (06/16/2022)   Humiliation, Afraid, Rape, and Kick questionnaire    Fear of Current or Ex-Partner: No    Emotionally Abused: No    Physically Abused: No    Sexually Abused: No    Outpatient Encounter Medications as of 09/20/2023  Medication Sig   ACCU-CHEK AVIVA PLUS test strip USE TO CHECK BLOOD SUGARS 1-2 TIMES DAILY. DX E11.9   aspirin EC 81 MG tablet Take 81 mg by mouth daily.   Biotin 1000 MCG tablet Take 1,000 mcg by mouth daily.   cetirizine (ZYRTEC) 10 MG tablet Take 1 tablet (10 mg total) by mouth daily.   Cholecalciferol 50 MCG (2000 UT) CAPS Take 2,000 Units by mouth.   Cyanocobalamin (B-12 PO) Take by mouth.   dapagliflozin propanediol (FARXIGA) 10 MG TABS tablet Take 1 tablet (10 mg total) by mouth daily.   fluticasone (FLONASE) 50 MCG/ACT nasal spray Place 2 sprays into both nostrils daily.   lidocaine (LIDODERM) 5 % lidocaine 5 % topical patch  PLACE 1 PATCH ONTO  THE SKIN DAILY. REMOVE & DISCARD PATCH WITHIN 12 HOURS OR AS DIRECTED BY MD   lisinopril (ZESTRIL) 10 MG tablet Take 1 tablet (10 mg total) by mouth daily.   lubiprostone (AMITIZA) 8 MCG capsule Take 1 capsule (8 mcg total) by mouth 2 (two) times daily with a meal.   Magnesium Oxide (MAG-200 PO) Take by mouth.   montelukast (SINGULAIR) 10 MG tablet Take 1 tablet (10 mg total) by mouth at bedtime.   Multiple Vitamins-Minerals (MULTIVITAMIN WITH MINERALS) tablet Take 1 tablet by mouth daily.   nystatin-triamcinolone (MYCOLOG II) cream Apply 1 application topically 2 (two) times daily.  Omega-3 Fatty Acids (MAXEPA PO) Take 500 mg by mouth daily at 6 (six) AM.   omeprazole (PRILOSEC) 20 MG capsule Take 20 mg by mouth. Takes as needed   psyllium (METAMUCIL) 58.6 % packet Take 1 packet by mouth daily.   triamcinolone cream (KENALOG) 0.5 % APPLY TOPICALLY 3 TIMES A DAY   No facility-administered encounter medications on file as of 09/20/2023.    No Known Allergies  Review of Systems As per HPI  Objective:  BP (!) 151/72   Pulse 76   Temp 98.5 F (36.9 C)   Ht 5\' 3"  (1.6 m)   Wt 167 lb (75.8 kg)   SpO2 97%   BMI 29.58 kg/m    Wt Readings from Last 3 Encounters:  09/20/23 167 lb (75.8 kg)  09/14/23 164 lb (74.4 kg)  06/20/23 166 lb (75.3 kg)   Physical Exam Constitutional:      General: She is awake. She is not in acute distress.    Appearance: Normal appearance. She is well-developed, well-groomed and overweight. She is not ill-appearing, toxic-appearing or diaphoretic.  Cardiovascular:     Rate and Rhythm: Normal rate and regular rhythm.     Pulses: Normal pulses.          Radial pulses are 2+ on the right side and 2+ on the left side.       Posterior tibial pulses are 2+ on the right side and 2+ on the left side.     Heart sounds: Normal heart sounds. No murmur heard.    No gallop.  Pulmonary:     Effort: Pulmonary effort is normal. No respiratory distress.     Breath sounds:  Normal breath sounds. No stridor. No wheezing, rhonchi or rales.  Musculoskeletal:     Cervical back: Full passive range of motion without pain and neck supple.     Right lower leg: 3+ Edema present.     Left lower leg: 4+ Edema present.  Skin:    General: Skin is warm.     Capillary Refill: Capillary refill takes less than 2 seconds.  Neurological:     General: No focal deficit present.     Mental Status: She is alert, oriented to person, place, and time and easily aroused. Mental status is at baseline.     GCS: GCS eye subscore is 4. GCS verbal subscore is 5. GCS motor subscore is 6.     Motor: No weakness.  Psychiatric:        Attention and Perception: Attention and perception normal.        Mood and Affect: Mood and affect normal.        Speech: Speech normal.        Behavior: Behavior normal. Behavior is cooperative.        Thought Content: Thought content normal. Thought content does not include homicidal or suicidal ideation. Thought content does not include homicidal or suicidal plan.        Cognition and Memory: Cognition and memory normal.        Judgment: Judgment normal.     Results for orders placed or performed in visit on 09/19/23  Anemia Profile B   Collection Time: 09/19/23  2:59 PM  Result Value Ref Range   Total Iron Binding Capacity 356 250 - 450 ug/dL   UIBC 732 202 - 542 ug/dL   Iron 28 27 - 706 ug/dL   Iron Saturation 8 (LL) 15 - 55 %   Ferritin 23 15 -  150 ng/mL   Vitamin B-12 728 232 - 1,245 pg/mL   Folate 13.6 >3.0 ng/mL   WBC 7.4 3.4 - 10.8 x10E3/uL   RBC 3.83 3.77 - 5.28 x10E6/uL   Hemoglobin 10.6 (L) 11.1 - 15.9 g/dL   Hematocrit 16.1 (L) 09.6 - 46.6 %   MCV 87 79 - 97 fL   MCH 27.7 26.6 - 33.0 pg   MCHC 31.7 31.5 - 35.7 g/dL   RDW 04.5 40.9 - 81.1 %   Platelets 327 150 - 450 x10E3/uL   Neutrophils 52 Not Estab. %   Lymphs 35 Not Estab. %   Monocytes 9 Not Estab. %   Eos 3 Not Estab. %   Basos 1 Not Estab. %   Neutrophils Absolute 3.9 1.4  - 7.0 x10E3/uL   Lymphocytes Absolute 2.6 0.7 - 3.1 x10E3/uL   Monocytes Absolute 0.6 0.1 - 0.9 x10E3/uL   EOS (ABSOLUTE) 0.2 0.0 - 0.4 x10E3/uL   Basophils Absolute 0.1 0.0 - 0.2 x10E3/uL   Immature Granulocytes 0 Not Estab. %   Immature Grans (Abs) 0.0 0.0 - 0.1 x10E3/uL   Retic Ct Pct 1.1 0.6 - 2.6 %       09/14/2023    4:06 PM 05/18/2023    8:07 AM 04/26/2023    9:25 AM 02/08/2023   12:18 PM 11/15/2022    8:14 AM  Depression screen PHQ 2/9  Decreased Interest 0 0 0 0 0  Down, Depressed, Hopeless 0 0 0 0 0  PHQ - 2 Score 0 0 0 0 0  Altered sleeping 1 3 0 0 0  Tired, decreased energy 0 0 0 0 0  Change in appetite 0 0 0 0 0  Feeling bad or failure about yourself  0 0 0 0 0  Trouble concentrating 0 0 0 0 0  Moving slowly or fidgety/restless 0 0 0 0 0  Suicidal thoughts 0 0 0 0 0  PHQ-9 Score 1 3 0 0 0  Difficult doing work/chores  Somewhat difficult Not difficult at all Not difficult at all Not difficult at all       09/14/2023    4:06 PM 05/18/2023    8:08 AM 04/26/2023    9:25 AM 02/08/2023   12:21 PM  GAD 7 : Generalized Anxiety Score  Nervous, Anxious, on Edge 1 1 0 0  Control/stop worrying 0 0 0 0  Worry too much - different things 0 0 0 0  Trouble relaxing 0 0 0 0  Restless 0 0 0 0  Easily annoyed or irritable 0 0 0 0  Afraid - awful might happen 0 0 0 0  Total GAD 7 Score 1 1 0 0  Anxiety Difficulty Somewhat difficult Not difficult at all Not difficult at all Not difficult at all   Pertinent labs & imaging results that were available during my care of the patient were reviewed by me and considered in my medical decision making.  Assessment & Plan:  Susan Haney was seen today for leg swelling.  Diagnoses and all orders for this visit:  Primary hypertension Will resume hydrochlorothiazide. Patient to take one half dose. Continue to monitor BP and side effects. Recommend close follow up in 2 weeks to monitor BP and edema. Cautious with hydrochlorothiazide given  history of CKD.  -     lisinopril-hydrochlorothiazide (ZESTORETIC) 20-25 MG tablet; Take 0.5 tablets by mouth daily.  Stage 3b chronic kidney disease (HCC) As above.  -     lisinopril-hydrochlorothiazide (ZESTORETIC)  20-25 MG tablet; Take 0.5 tablets by mouth daily.  Bilateral lower extremity edema As above.  -     lisinopril-hydrochlorothiazide (ZESTORETIC) 20-25 MG tablet; Take 0.5 tablets by mouth daily.  Iron deficiency anemia, unspecified iron deficiency anemia type Reviewed recent labs with patient. Patient to start supplementation. Will repeat labs to monitor. Discussed best ways to improve absorption and limit side effects of medications.   Continue all other maintenance medications.  Follow up plan: Return in about 2 weeks (around 10/04/2023) for with PCP for edema and BP check. .   Continue healthy lifestyle choices, including diet (rich in fruits, vegetables, and lean proteins, and low in salt and simple carbohydrates) and exercise (at least 30 minutes of moderate physical activity daily).  Written and verbal instructions provided   The above assessment and management plan was discussed with the patient. The patient verbalized understanding of and has agreed to the management plan. Patient is aware to call the clinic if they develop any new symptoms or if symptoms persist or worsen. Patient is aware when to return to the clinic for a follow-up visit. Patient educated on when it is appropriate to go to the emergency department.   Neale Burly, DNP-FNP [facility] [address] [place], [place] [address] [phone]

## 2023-09-22 DIAGNOSIS — M25552 Pain in left hip: Secondary | ICD-10-CM | POA: Diagnosis not present

## 2023-09-22 DIAGNOSIS — M545 Low back pain, unspecified: Secondary | ICD-10-CM | POA: Diagnosis not present

## 2023-09-28 ENCOUNTER — Ambulatory Visit: Payer: Medicare Other

## 2023-09-29 DIAGNOSIS — M545 Low back pain, unspecified: Secondary | ICD-10-CM | POA: Diagnosis not present

## 2023-09-29 DIAGNOSIS — M25552 Pain in left hip: Secondary | ICD-10-CM | POA: Diagnosis not present

## 2023-10-04 ENCOUNTER — Encounter (INDEPENDENT_AMBULATORY_CARE_PROVIDER_SITE_OTHER): Payer: Self-pay | Admitting: Gastroenterology

## 2023-10-04 ENCOUNTER — Ambulatory Visit (INDEPENDENT_AMBULATORY_CARE_PROVIDER_SITE_OTHER): Payer: Medicare Other | Admitting: Gastroenterology

## 2023-10-04 VITALS — BP 116/73 | HR 79 | Temp 97.8°F | Ht 63.0 in | Wt 166.0 lb

## 2023-10-04 DIAGNOSIS — K5900 Constipation, unspecified: Secondary | ICD-10-CM

## 2023-10-04 DIAGNOSIS — K219 Gastro-esophageal reflux disease without esophagitis: Secondary | ICD-10-CM

## 2023-10-04 DIAGNOSIS — K5904 Chronic idiopathic constipation: Secondary | ICD-10-CM

## 2023-10-04 DIAGNOSIS — D509 Iron deficiency anemia, unspecified: Secondary | ICD-10-CM | POA: Diagnosis not present

## 2023-10-04 MED ORDER — OMEPRAZOLE 20 MG PO CPDR
20.0000 mg | DELAYED_RELEASE_CAPSULE | Freq: Every day | ORAL | 1 refills | Status: DC | PRN
Start: 2023-10-04 — End: 2023-10-27

## 2023-10-04 NOTE — Progress Notes (Addendum)
 Referring Provider: Gabriel Earing, FNP Primary Care Physician:  Gabriel Earing, FNP Primary GI Physician: Dr. Levon Hedger   Chief Complaint  Patient presents with   Follow-up    Patient here today for a follow up on her Genella Rife and issues with constipation. Genella Rife doing well with omeprazole 20 mg prn, she says she is having some issues with being hoarse,but thinks related to pollen and Constipation. Patient is taking Amitzia 8 mcg and it works occasionally and other times she does not feel it is working that well. She also takes metamucil prn.    HPI:   Susan Haney is a 89 y.o. female with past medical history of anemia, arthritis, biscuspid aortic valve, DDD, GERD, HTN, prediabetes.   Patient presenting today for:  Follow up of GERD and constipation  Last seen march 2024, at that time reported having at least very small bowel movement daily, often did not feel she was emptying out sufficiently.  Having some harder stool balls.  Having diarrhea on occasion.  Some abdominal discomfort/gurgling.  Some fecal seepage at times.  Occasional nausea if she takes two Amitiza at once.  GERD well-managed with diet, taking omeprazole maybe twice per month if she eats something she knows she should not.  Patient recommended to continue PPI as needed, stop Amitiza, start Ibsrela 50 mg twice daily, continue good reflux precautions  Present: States she has had more gas the last few days. She is taking amitiza BID, she notes that she may go 2 days without a BM but notes she may have 1 good BM and then will have a watery BM thereafter but generally only has 1-2 BM. She is unsure if Allena Napoleon worked or not, appears per chart review her insruance did not cover this. She took linzess in the past that caused a lot of diarrhea. She denies any abdominal pain. Marland Kitchen Appetite is good.   She reports history of IDA in the past, most recently in 2017 though states no cause was ever found. She notably did not have  any endoscopic evaluations at that time.  Most recent labs she had done in February with hgb 10.9 and iron studies on 3/3 with TIBC 356, Iron 28, iron sat 8, ferritin 23, B12 728, folate 13.6. She denies melena, did see a swipe of blood on toilet tissue a few weeks back after she notes she cleaned her rectal area vigorously, no blood mixed in with stools or any further episodes of bleeding. No dizziness, sob or fatigue. She does not take any NSAIDs. Denies any nausea or vomiting. No abdominal pain, early satiety or overt weight loss.   She is taking omeprazole 20mg  PRN, maybe once per month with good control over her GERD.   Last Colonoscopy:04/04/14 Examination performed to cecum. Single left-sided diverticulum and external hemorrhoids otherwise normal colonoscopy.   Filed Weights   10/04/23 1330  Weight: 166 lb (75.3 kg)     Past Medical History:  Diagnosis Date   Allergy    Anemia    history ,  took iron for years   Arthritis    Bicuspid aortic valve    Cancer (HCC) 2017   endometrial cancer / cervical   Cataract    Cellulitis 12/31/2017   BLE, ankles   Chronic back pain    Chronic kidney disease    DDD (degenerative disc disease), lumbar    Diabetes mellitus without complication (HCC)    Endometrial cancer (HCC)    GERD (gastroesophageal  reflux disease)    History of radiation therapy 06/15/16-07/06/16   vaginal cuff 30 Gy in 5 fractions   Hypertension    Neuromuscular disorder (HCC)    Prediabetes    Spondylosis    lumbar    Past Surgical History:  Procedure Laterality Date   ABDOMINAL HYSTERECTOMY     BUNIONECTOMY     CARPAL TUNNEL RELEASE Right 05/30/2018   Procedure: RIGHT CARPAL TUNNEL RELEASE;  Surgeon: Cindee Salt, MD;  Location: [facility];  Service: Orthopedics;  Laterality: Right;   COLONOSCOPY N/A 04/04/2014   Procedure: COLONOSCOPY;  Surgeon: Malissa Hippo, MD;  Location: [facility];  Service: Endoscopy;  Laterality: N/A;  100   EYE  SURGERY     bil cataracts and lens implants   FOOT SURGERY     Left   FRACTURE SURGERY     JOINT REPLACEMENT     Ovary removed: benign     REPLACEMENT TOTAL KNEE BILATERAL     2013   ROBOTIC ASSISTED TOTAL HYSTERECTOMY WITH BILATERAL SALPINGO OOPHERECTOMY Left 04/13/2016   Procedure: XI ROBOTIC ASSISTED TOTAL HYSTERECTOMY WITH LEFT SALPINGO OOPHORECTOMY SENTINEL LYMPH NODE BIOPSY;  Surgeon: Adolphus Birchwood, MD;  Location: [facility];  Service: Gynecology;  Laterality: Left;   TOTAL KNEE REVISION Left 02/12/2016   Procedure: LEFT TOTAL KNEE REVISION;  Surgeon: Samson Frederic, MD;  Location: [facility];  Service: Orthopedics;  Laterality: Left;    Current Outpatient Medications  Medication Sig Dispense Refill   ACCU-CHEK AVIVA PLUS test strip USE TO CHECK BLOOD SUGARS 1-2 TIMES DAILY. DX E11.9 100 strip 12   aspirin EC 81 MG tablet Take 81 mg by mouth daily.     Biotin 1000 MCG tablet Take 1,000 mcg by mouth daily.     cetirizine (ZYRTEC) 10 MG tablet Take 1 tablet (10 mg total) by mouth daily. 90 tablet 1   Cholecalciferol 50 MCG (2000 UT) CAPS Take 2,000 Units by mouth.     Cyanocobalamin (B-12 PO) Take by mouth.     dapagliflozin propanediol (FARXIGA) 10 MG TABS tablet Take 1 tablet (10 mg total) by mouth daily. 100 tablet 2   fluticasone (FLONASE) 50 MCG/ACT nasal spray Place 2 sprays into both nostrils daily. 16 g 6   lidocaine (LIDODERM) 5 % lidocaine 5 % topical patch  PLACE 1 PATCH ONTO THE SKIN DAILY. REMOVE & DISCARD PATCH WITHIN 12 HOURS OR AS DIRECTED BY MD     lisinopril-hydrochlorothiazide (ZESTORETIC) 20-25 MG tablet Take 0.5 tablets by mouth daily.     lubiprostone (AMITIZA) 8 MCG capsule Take 1 capsule (8 mcg total) by mouth 2 (two) times daily with a meal. 200 capsule 2   Magnesium Oxide (MAG-200 PO) Take by mouth daily at 6 (six) AM.     montelukast (SINGULAIR) 10 MG tablet Take 1 tablet (10 mg total) by mouth at bedtime. 100 tablet 2   Multiple Vitamins-Minerals (MULTIVITAMIN WITH  MINERALS) tablet Take 1 tablet by mouth daily.     nystatin-triamcinolone (MYCOLOG II) cream Apply 1 application topically 2 (two) times daily. 30 g 1   Omega-3 Fatty Acids (MAXEPA PO) Take 500 mg by mouth daily at 6 (six) AM.     omeprazole (PRILOSEC) 20 MG capsule Take 20 mg by mouth. Takes as needed     psyllium (METAMUCIL) 58.6 % packet Take 1 packet by mouth daily.     triamcinolone cream (KENALOG) 0.5 % APPLY TOPICALLY 3 TIMES A DAY 45 g 2  No current facility-administered medications for this visit.    Allergies as of 10/04/2023   (No Known Allergies)    Social History   Socioeconomic History   Marital status: Widowed    Spouse name: Not on file   Number of children: 2   Years of education: Not on file   Highest education level: Associate degree: occupational, Scientist, product/process development, or vocational program  Occupational History   Occupation: retired  Tobacco Use   Smoking status: Never    Passive exposure: Past   Smokeless tobacco: Never  Vaping Use   Vaping status: Never Used  Substance and Sexual Activity   Alcohol use: No   Drug use: No   Sexual activity: Not Currently    Birth control/protection: None  Other Topics Concern   Not on file  Social History Narrative   2 sons - in [place] and [place]   Disabled sister lives close by   Social Drivers of [employer] Strain: Low Risk  (09/19/2023)   Overall Financial Resource Strain (CARDIA)    Difficulty of Paying Living Expenses: Not hard at all  Food Insecurity: No Food Insecurity (09/19/2023)   Hunger Vital Sign    Worried About Running Out of Food in the Last Year: Never true    Ran Out of Food in the Last Year: Never true  Transportation Needs: No Transportation Needs (09/19/2023)   PRAPARE - Administrator, Civil Service (Medical): No    Lack of Transportation (Non-Medical): No  Physical Activity: Insufficiently Active (09/19/2023)   Exercise Vital Sign    Days of Exercise per Week: 4 days     Minutes of Exercise per Session: 30 min  Stress: No Stress Concern Present (09/19/2023)   [employer] of Occupational Health - Occupational Stress Questionnaire    Feeling of Stress : Only a little  Social Connections: Moderately Integrated (09/19/2023)   Social Connection and Isolation Panel [NHANES]    Frequency of Communication with Friends and Family: More than three times a week    Frequency of Social Gatherings with Friends and Family: Once a week    Attends Religious Services: More than 4 times per year    Active Member of [employer] or Organizations: Yes    Attends Banker Meetings: More than 4 times per year    Marital Status: Widowed    Review of systems General: negative for malaise, night sweats, fever, chills, weight loss Neck: Negative for lumps, goiter, pain and significant neck swelling Resp: Negative for cough, wheezing, dyspnea at rest CV: Negative for chest pain, leg swelling, palpitations, orthopnea GI: denies melena, hematochezia, nausea, vomiting, diarrhea, constipation, dysphagia, odyonophagia, early satiety or unintentional weight loss.  The remainder of the review of systems is noncontributory.  Physical Exam: BP 116/73 (BP Location: Left Arm, Patient Position: Sitting, Cuff Size: Normal)   Pulse 79   Temp 97.8 F (36.6 C) (Temporal)   Ht 5\' 3"  (1.6 m)   Wt 166 lb (75.3 kg)   BMI 29.41 kg/m  General:   Alert and oriented. No distress noted. Pleasant and cooperative.  Head:  Normocephalic and atraumatic. Eyes:  Conjuctiva clear without scleral icterus. Mouth:  Oral mucosa pink and moist. Good dentition. No lesions. Heart: Normal rate and rhythm, s1 and s2 heart sounds present.  Lungs: Clear lung sounds in all lobes. Respirations equal and unlabored. Abdomen:  +BS, soft, non-tender and non-distended. No rebound or guarding. No HSM or masses noted. Neurologic:  Alert  and  oriented x4 Psych:  Alert and cooperative. Normal mood and  affect.  Invalid input(s): "6 MONTHS"   ASSESSMENT: MACHEL VIOLANTE is a 89 y.o. female presenting today for follow up of GERD, constipation and new onset IDA  GERD: Well-managed with PPI as needed, maybe once or twice per month.  She denies any upper GI symptoms.  Will continue with PPI as needed.  Continue with good reflux precautions.  Constipation: Mostly well-managed on Amitiza 8 mcg twice daily, noting usually 1-2 bowel movements per day with solid stool starting out and sometimes more watery stools or after food no more than 2 BMs per day.  Sometimes she will skip 2 or 3 days between BMs, notably did not tolerate Amitiza 24 mcg twice daily in the past.  We tried [place] that her insurance would not cover this.  Linzess in the past gave her diarrhea.  She denies any abdominal pain.  At this time would recommend we continue with Amitiza 8 mcg twice daily, we discussed that as long as she is not having more than 3-4 episodes of diarrhea per day she is moderate risk for dehydration or electrolyte abnormalities.  New onset IDA: New onset iron deficiency anemia as noted PCP on recent labs done by them.  Hemoglobin 10.9 with low iron saturation of 8, iron 28, ferritin 23, B12 and folate within normal limits.  She had 1 episode of some toilet tissue hematochezia after vigorous cleaning though no other rectal bleeding or melena.  She denies shortness of breath, fatigue, dizziness.  She denies any associated GI symptoms.  She does not take any NSAIDs.  She is seeing PCP for follow-up regarding IDA again tomorrow.  I will forward my note to them as well, however we discussed typical approach to new onset IDA is endoscopic evaluation with both EGD and colonoscopy though given her advanced age, would recommend starting out more conservatively with occult stool cards x 3, pending results of these if they are positive we will need to have further conversations regarding risk versus benefits of proceeding with EGD  versus colonoscopy to evaluate for GI sources of acute blood loss.   PLAN:  -continue daily iron -occult stool cards x3 -may consider EGD/Colonoscopy pending occult stool card results -continue amitiza BID -continue omeprazole 20mg  PRN   All questions were answered, patient verbalized understanding and is in agreement with plan as outlined above.    Follow Up: 3 months   Susan Erven L. Jeanmarie Hubert, MSN, APRN, AGNP-C Adult-Gerontology Nurse Practitioner [facility]  I have reviewed the note and agree with the APP's assessment as described in this progress note  If positive stool cards or if presenting worsening anemia, will need to proceed with EGD and colonoscopy.  Katrinka Blazing, MD Gastroenterology and Hepatology [facility]

## 2023-10-04 NOTE — Patient Instructions (Signed)
-  continue daily iron -complete stool cards x3 and return to [place] -may consider EGD/Colonoscopy pending stool card results, we will discuss this further after I review the results  -continue amitiza twice daily  -continue omeprazole 20mg  as needed -let me know if you have any blood in your stools  Follow up 3 months  It was a pleasure to see you today. I want to create trusting relationships with patients and provide genuine, compassionate, and quality care. I truly value your feedback! please be on the lookout for a survey regarding your visit with me today. I appreciate your input about our visit and your time in completing this!    Susan Haney L. Jeanmarie Hubert, MSN, APRN, AGNP-C Adult-Gerontology Nurse Practitioner [facility]astroenterology at [facility]

## 2023-10-05 ENCOUNTER — Other Ambulatory Visit

## 2023-10-05 ENCOUNTER — Ambulatory Visit (INDEPENDENT_AMBULATORY_CARE_PROVIDER_SITE_OTHER): Admitting: Family Medicine

## 2023-10-05 VITALS — BP 123/77 | HR 82 | Temp 98.6°F | Ht 63.0 in | Wt 166.0 lb

## 2023-10-05 DIAGNOSIS — D509 Iron deficiency anemia, unspecified: Secondary | ICD-10-CM

## 2023-10-05 DIAGNOSIS — I10 Essential (primary) hypertension: Secondary | ICD-10-CM | POA: Diagnosis not present

## 2023-10-05 DIAGNOSIS — R002 Palpitations: Secondary | ICD-10-CM

## 2023-10-05 DIAGNOSIS — R600 Localized edema: Secondary | ICD-10-CM

## 2023-10-05 DIAGNOSIS — N1832 Chronic kidney disease, stage 3b: Secondary | ICD-10-CM

## 2023-10-05 NOTE — Progress Notes (Signed)
   Acute Office Visit  Subjective:     Patient ID: Susan Haney, female    DOB: Dec 19, 1931, 89 y.o.   MRN: [id number]  Chief Complaint  Patient presents with   Blood Pressure Check    HPI Patient is in today for follow up of BP. Hydrochlorothiazide was held a few weeks ago due to hypotension. She then developed peripheral edema. Hydrochlorothiazide was restarted at a half dose. She has been monitoring BP at home and it has been well controlled overall. She denies chest pain or shortness of breath she does reports palpitations daily. Sometimes feels like racing or skipping beats.  She just started on an iron supplement. She has seen GI and has stool cards ordered. Denies bleeding.      ROS Negative unless specially indicated above in HPI.     Objective:    BP 123/77   Pulse 82   Temp 98.6 F (37 C) (Temporal)   Ht 5\' 3"  (1.6 m)   Wt 166 lb (75.3 kg)   SpO2 98%   BMI 29.41 kg/m  BP Readings from Last 3 Encounters:  10/05/23 123/77  10/04/23 116/73  09/20/23 (!) 148/64      Physical Exam Vitals and nursing note reviewed.  Constitutional:      General: She is not in acute distress.    Appearance: Normal appearance. She is not ill-appearing.  Cardiovascular:     Rate and Rhythm: Normal rate and regular rhythm.     Pulses: Normal pulses.     Heart sounds: Normal heart sounds. No murmur heard. Pulmonary:     Effort: Pulmonary effort is normal. No respiratory distress.     Breath sounds: Normal breath sounds.  Abdominal:     General: Bowel sounds are normal. There is no distension.     Palpations: Abdomen is soft. There is no mass.     Tenderness: There is no abdominal tenderness. There is no guarding or rebound.  Musculoskeletal:     Cervical back: Neck supple. No tenderness.     Right lower leg: No edema.     Left lower leg: No edema.  Lymphadenopathy:     Cervical: No cervical adenopathy.  Skin:    General: Skin is warm and dry.  Neurological:      General: No focal deficit present.     Mental Status: She is alert and oriented to person, place, and time.  Psychiatric:        Mood and Affect: Mood normal.        Behavior: Behavior normal.     No results found for any visits on 10/05/23.      Assessment & Plan:   Hendel was seen today for blood pressure check.  Diagnoses and all orders for this visit:  Primary hypertension Well controlled on current regimen.   Palpitations Zio applied today. Will notify patient of report when available.  -     LONG TERM MONITOR (3-14 DAYS); Future  Bilateral lower extremity edema Well controlled with hydrochlorothiazide. Elevation, compression socks.   Iron deficiency anemia, unspecified iron deficiency anemia type Continue iron supplement and follow up with GI.   Stage 3b chronic kidney disease (HCC) Recent GFR stable. On ACE and farxiga.    Return if symptoms worsen or fail to improve.  Gabriel Earing, FNP

## 2023-10-06 ENCOUNTER — Telehealth: Payer: Self-pay | Admitting: Family Medicine

## 2023-10-06 NOTE — Telephone Encounter (Signed)
 Copied from CRM [id number]. Topic: Clinical - Medical Advice >> Oct 06, 2023  1:41 PM Franchot Heidelberg wrote: Reason for CRM: Pt has a question about her heart monitor and wants to speak to the clinic. Please advise   Best contact: [phone]

## 2023-10-06 NOTE — Telephone Encounter (Signed)
 Patient wanted to know if she could take vitals with her home equipment while wearing monitor. I informed yes it will be okay.

## 2023-10-10 ENCOUNTER — Encounter: Payer: Self-pay | Admitting: Family Medicine

## 2023-10-21 ENCOUNTER — Other Ambulatory Visit (INDEPENDENT_AMBULATORY_CARE_PROVIDER_SITE_OTHER): Payer: Self-pay | Admitting: *Deleted

## 2023-10-21 ENCOUNTER — Telehealth (INDEPENDENT_AMBULATORY_CARE_PROVIDER_SITE_OTHER): Payer: Self-pay | Admitting: *Deleted

## 2023-10-21 DIAGNOSIS — D509 Iron deficiency anemia, unspecified: Secondary | ICD-10-CM

## 2023-10-21 LAB — POC HEMOCCULT BLD/STL (HOME/3-CARD/SCREEN)
Card #2 Fecal Occult Blod, POC: POSITIVE
Card #3 Fecal Occult Blood, POC: POSITIVE
Fecal Occult Blood, POC: POSITIVE — AB

## 2023-10-21 NOTE — Telephone Encounter (Addendum)
 Patient dropped off hemoccult cards x 3 today. All three positive. Entered in results but sending message due to being positive. Patient was still here in office when I tested them and I let her know they were all three positive. She said she has not seen any more blood since her visit. Not having any dizziness or sob. She was advised if she starts to having dizziness, sob or fatigue she should go to ED and I would be in touch with her once you reviewed the results. She is taking iron once daily.   She told me I can reply on mychart to her or call [phone]

## 2023-10-25 NOTE — Telephone Encounter (Signed)
 Patient called back today states she had the understanding someone would call her on Monday on the recommendations. I advised I would check with the provider to see if there are any recommendations. Please advise.

## 2023-10-27 ENCOUNTER — Encounter: Payer: Self-pay | Admitting: Nurse Practitioner

## 2023-10-27 ENCOUNTER — Other Ambulatory Visit (INDEPENDENT_AMBULATORY_CARE_PROVIDER_SITE_OTHER): Payer: Self-pay | Admitting: Gastroenterology

## 2023-10-27 ENCOUNTER — Ambulatory Visit: Admitting: Nurse Practitioner

## 2023-10-27 VITALS — BP 133/77 | HR 95 | Temp 97.9°F | Ht 63.0 in | Wt 164.0 lb

## 2023-10-27 DIAGNOSIS — L02415 Cutaneous abscess of right lower limb: Secondary | ICD-10-CM | POA: Diagnosis not present

## 2023-10-27 DIAGNOSIS — R002 Palpitations: Secondary | ICD-10-CM | POA: Diagnosis not present

## 2023-10-27 MED ORDER — SULFAMETHOXAZOLE-TRIMETHOPRIM 800-160 MG PO TABS: 1.0000 | ORAL_TABLET | Freq: Two times a day (BID) | ORAL | 0 refills | Status: DC

## 2023-10-27 NOTE — Progress Notes (Signed)
 Subjective:    Patient ID: Susan Haney, female    DOB: February 26, 1932, 89 y.o.   MRN: [id number]   Chief Complaint: Open area to right lower leg (Hit on dishwasher 2 weeks ago/)   HPI  Patient hit her leg on dishwasher 2 weeks ago. Started as a bruise. But now has turned into a sore. Red a weeping. 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  Eyes:  Negative for pain.  Respiratory:  Negative for shortness of breath.   Cardiovascular:  Negative for chest pain, palpitations and leg swelling.  Gastrointestinal:  Negative for abdominal pain.  Endocrine: Negative for polydipsia.  Skin:  Negative for rash.  Neurological:  Negative for dizziness, weakness and headaches.  Hematological:  Does not bruise/bleed easily.  All other systems reviewed and are negative.      Objective:   Physical Exam Constitutional:      Appearance: Normal appearance. She is obese.  Cardiovascular:     Rate and Rhythm: Normal rate and regular rhythm.     Heart sounds: Normal heart sounds.  Pulmonary:     Effort: Pulmonary effort is normal.     Breath sounds: Normal breath sounds.   Skin:    General: Skin is warm.  Neurological:     General: No focal deficit present.     Mental Status: She is alert and oriented to person, place, and time.  Psychiatric:        Mood and Affect: Mood normal.        Behavior: Behavior normal.      BP 133/77   Pulse 95   Temp 97.9 F (36.6 C) (Temporal)   Ht 5\' 3"  (1.6 m)   Wt 164 lb (74.4 kg)   BMI 29.05 kg/m   I & D  Date/Time: 10/27/2023 11:36 AM  Performed by: Bennie Pierini, FNP Authorized by: Bennie Pierini, FNP   Consent:    Consent obtained:  Verbal   Consent given by:  Patient   Risks, benefits, and alternatives were discussed: yes     Risks discussed:  Infection Location:    Type:  Abscess   Size:  5cm annular Pre-procedure details:    Skin preparation:  Antiseptic wash Sedation:    Sedation type:  None Anesthesia:    Anesthesia method:  None Procedure type:    Complexity:  Simple Procedure details:    Needle aspiration: no     Drainage:  Serous   Drainage amount:  Scant   Packing materials:  None Comments:     MEPILEX dressing        Assessment & Plan:   Susan Haney in today with chief complaint of  Open area to right lower leg (Hit on dishwasher 2 weeks ago/)   1. Abscess of right leg (Primary) Leave dressing until Saturday Clean with antibacterial soap bid Kep covered RTO prn - sulfamethoxazole-trimethoprim (BACTRIM DS) 800-160 MG tablet; Take 1 tablet by mouth 2 (two) times daily.  Dispense: 20 tablet; Refill: 0    The above assessment and management plan was discussed with the patient. The patient verbalized understanding of and has agreed to the management plan. Patient is aware to call the clinic if symptoms persist or worsen. Patient is aware when to return to the clinic for a follow-up visit. Patient educated on when it is appropriate to go to the emergency department.   Mary-Margaret Daphine Deutscher, FNP

## 2023-10-27 NOTE — Patient Instructions (Signed)

## 2023-11-01 ENCOUNTER — Other Ambulatory Visit: Payer: Self-pay | Admitting: Family Medicine

## 2023-11-01 DIAGNOSIS — N1832 Chronic kidney disease, stage 3b: Secondary | ICD-10-CM

## 2023-11-01 DIAGNOSIS — R600 Localized edema: Secondary | ICD-10-CM

## 2023-11-01 DIAGNOSIS — I10 Essential (primary) hypertension: Secondary | ICD-10-CM

## 2023-11-04 ENCOUNTER — Emergency Department (HOSPITAL_COMMUNITY)
Admission: EM | Admit: 2023-11-04 | Discharge: 2023-11-04 | Disposition: A | Discharge status: Home / Self Care | Attending: Emergency Medicine | Admitting: Emergency Medicine

## 2023-11-04 ENCOUNTER — Ambulatory Visit: Payer: Self-pay | Admitting: Family Medicine

## 2023-11-04 ENCOUNTER — Other Ambulatory Visit: Payer: Self-pay

## 2023-11-04 DIAGNOSIS — R110 Nausea: Secondary | ICD-10-CM | POA: Diagnosis not present

## 2023-11-04 DIAGNOSIS — Z7982 Long term (current) use of aspirin: Secondary | ICD-10-CM | POA: Insufficient documentation

## 2023-11-04 DIAGNOSIS — R112 Nausea with vomiting, unspecified: Secondary | ICD-10-CM | POA: Diagnosis not present

## 2023-11-04 DIAGNOSIS — Z743 Need for continuous supervision: Secondary | ICD-10-CM | POA: Diagnosis not present

## 2023-11-04 DIAGNOSIS — I499 Cardiac arrhythmia, unspecified: Secondary | ICD-10-CM | POA: Diagnosis not present

## 2023-11-04 DIAGNOSIS — R197 Diarrhea, unspecified: Secondary | ICD-10-CM | POA: Insufficient documentation

## 2023-11-04 DIAGNOSIS — R1111 Vomiting without nausea: Secondary | ICD-10-CM | POA: Diagnosis not present

## 2023-11-04 DIAGNOSIS — R6889 Other general symptoms and signs: Secondary | ICD-10-CM | POA: Diagnosis not present

## 2023-11-04 LAB — CBC WITH DIFFERENTIAL/PLATELET
Abs Immature Granulocytes: 0.05 10*3/uL (ref 0.00–0.07)
Basophils Absolute: 0 10*3/uL (ref 0.0–0.1)
Basophils Relative: 0 %
Eosinophils Absolute: 0 10*3/uL (ref 0.0–0.5)
Eosinophils Relative: 0 %
HCT: 34 % — ABNORMAL LOW (ref 36.0–46.0)
Hemoglobin: 10.6 g/dL — ABNORMAL LOW (ref 12.0–15.0)
Immature Granulocytes: 0 %
Lymphocytes Relative: 2 %
Lymphs Abs: 0.2 10*3/uL — ABNORMAL LOW (ref 0.7–4.0)
MCH: 27.5 pg (ref 26.0–34.0)
MCHC: 31.2 g/dL (ref 30.0–36.0)
MCV: 88.1 fL (ref 80.0–100.0)
Monocytes Absolute: 0.3 10*3/uL (ref 0.1–1.0)
Monocytes Relative: 2 %
Neutro Abs: 11.1 10*3/uL — ABNORMAL HIGH (ref 1.7–7.7)
Neutrophils Relative %: 96 %
Platelets: 296 10*3/uL (ref 150–400)
RBC: 3.86 MIL/uL — ABNORMAL LOW (ref 3.87–5.11)
RDW: 16.4 % — ABNORMAL HIGH (ref 11.5–15.5)
WBC: 11.7 10*3/uL — ABNORMAL HIGH (ref 4.0–10.5)
nRBC: 0 % (ref 0.0–0.2)

## 2023-11-04 LAB — COMPREHENSIVE METABOLIC PANEL WITH GFR
ALT: 13 U/L (ref 0–44)
AST: 18 U/L (ref 15–41)
Albumin: 3.4 g/dL — ABNORMAL LOW (ref 3.5–5.0)
Alkaline Phosphatase: 44 U/L (ref 38–126)
Anion gap: 7 (ref 5–15)
BUN: 34 mg/dL — ABNORMAL HIGH (ref 8–23)
CO2: 19 mmol/L — ABNORMAL LOW (ref 22–32)
Calcium: 9.2 mg/dL (ref 8.9–10.3)
Chloride: 110 mmol/L (ref 98–111)
Creatinine, Ser: 1.67 mg/dL — ABNORMAL HIGH (ref 0.44–1.00)
GFR, Estimated: 29 mL/min — ABNORMAL LOW (ref >60)
Glucose, Bld: 127 mg/dL — ABNORMAL HIGH (ref 70–99)
Potassium: 4.2 mmol/L (ref 3.5–5.1)
Sodium: 136 mmol/L (ref 135–145)
Total Bilirubin: 0.5 mg/dL (ref 0.0–1.2)
Total Protein: 6.7 g/dL (ref 6.5–8.1)

## 2023-11-04 MED ORDER — ONDANSETRON 4 MG PO TBDP: ORAL_TABLET | ORAL | 0 refills | Status: DC

## 2023-11-04 MED ORDER — SODIUM CHLORIDE 0.9 % IV BOLUS
1000.0000 mL | Freq: Once | INTRAVENOUS | Status: AC
Start: 2023-11-04 — End: 2023-11-04
  Administered 2023-11-04: 1000 mL via INTRAVENOUS

## 2023-11-04 MED ORDER — KETOROLAC TROMETHAMINE 30 MG/ML IJ SOLN
15.0000 mg | Freq: Once | INTRAMUSCULAR | Status: AC
  Administered 2023-11-04: 15 mg via INTRAVENOUS
  Filled 2023-11-04: qty 1

## 2023-11-04 MED ORDER — ACETAMINOPHEN 325 MG PO TABS
650.0000 mg | ORAL_TABLET | Freq: Once | ORAL | Status: AC
  Administered 2023-11-04: 650 mg via ORAL
  Filled 2023-11-04: qty 2

## 2023-11-04 NOTE — Discharge Instructions (Signed)
 Take tylenol  for aches and drink plenty of fluids.  Follow up next week with your md.  Return sooner if problems

## 2023-11-04 NOTE — ED Triage Notes (Signed)
 Pt arrived via [facility] c/o N/V/D x 0230 this morning , pain 0/10 pt is on day 6 of 10 of ABX for R ankle wound. Pt took 4 mg of zofran   at 0941 with some improvement

## 2023-11-04 NOTE — Telephone Encounter (Signed)
 Patient's friend Ellouise not on [place] and not with patient now calling  Chief Complaint: patient called caller to report V/D since 2:30 am  Symptoms: V/D feels weak, lightheaded. BP 148/91 pulse 107.  Frequency: this am 2:30 Pertinent Negatives: Patient denies na  Disposition: [x] ED /[] Urgent Care (no appt availability in office) / [] Appointment(In office/virtual)/ []  [place] [facility]/ [] Home Care/ [] Refused Recommended Disposition /[] [facility]/ []  Follow-up with PCP Additional Notes:   Instructed caller to take patient to ED when she arrives at patient home or call 911 if patient too weak to walk.      Copied from CRM [id number]. Topic: Clinical - Red Word Triage >> Nov 04, 2023  8:30 AM Bascom RAMAN wrote: Red Word that prompted transfer to Nurse Triage: throwing up and diarrhea since 2:30 this morning. feeling weak, light headed and voice shaky. Ellouise BIRCH, friend. Not with patient. Not on DPR Reason for Disposition  Patient sounds very sick or weak to the triager  Answer Assessment - Initial Assessment Questions 1. DIARRHEA SEVERITY: How bad is the diarrhea? How many more stools have you had in the past 24 hours than normal?    - NO DIARRHEA (SCALE 0)   - MILD (SCALE 1-3): Few loose or mushy BMs; increase of 1-3 stools over normal daily number of stools; mild increase in ostomy output.   -  MODERATE (SCALE 4-7): Increase of 4-6 stools daily over normal; moderate increase in ostomy output.   -  SEVERE (SCALE 8-10; OR WORST POSSIBLE): Increase of 7 or more stools daily over normal; moderate increase in ostomy output; incontinence.     Unknown from caller but reports patient sitting on toilet now  2. ONSET: When did the diarrhea begin?      2;30 am  3. BM CONSISTENCY: How loose or watery is the diarrhea?      Na  4. VOMITING: Are you also vomiting? If Yes, ask: How many times in the past 24 hours?      Yes vomiting uknown how many times 5. ABDOMEN PAIN: Are you  having any abdomen pain? If Yes, ask: What does it feel like? (e.g., crampy, dull, intermittent, constant)      unknown 6. ABDOMEN PAIN SEVERITY: If present, ask: How bad is the pain?  (e.g., Scale 1-10; mild, moderate, or severe)   - MILD (1-3): doesn't interfere with normal activities, abdomen soft and not tender to touch    - MODERATE (4-7): interferes with normal activities or awakens from sleep, abdomen tender to touch    - SEVERE (8-10): excruciating pain, doubled over, unable to do any normal activities       na 7. ORAL INTAKE: If vomiting, Have you been able to drink liquids? How much liquids have you had in the past 24 hours?     Na  8. HYDRATION: Any signs of dehydration? (e.g., dry mouth [not just dry lips], too weak to stand, dizziness, new weight loss) When did you last urinate?     Feels weak, light headed, voice shaky 9. EXPOSURE: Have you traveled to a foreign country recently? Have you been exposed to anyone with diarrhea? Could you have eaten any food that was spoiled?     na 10. ANTIBIOTIC USE: Are you taking antibiotics now or have you taken antibiotics in the past 2 months?       na 11. OTHER SYMPTOMS: Do you have any other symptoms? (e.g., fever, blood in stool)  See above  12. PREGNANCY: Is there any chance you are pregnant? When was your last menstrual period?       na  Protocols used: [facility]

## 2023-11-07 NOTE — Telephone Encounter (Signed)
 Patient was seen in ER 11/04/23 as directed.

## 2023-11-08 ENCOUNTER — Ambulatory Visit: Payer: Self-pay

## 2023-11-08 NOTE — ED Provider Notes (Signed)
 [place] EMERGENCY DEPARTMENT AT [facility] Provider Note   CSN: [id number] Arrival date & time: 11/04/23  1021     History  Chief Complaint  Patient presents with   Emesis    Susan Haney is a 89 y.o. female.  Pt complains of nvd  The history is provided by the patient and medical records. No language interpreter was used.  Emesis Severity:  Moderate Quality:  Bilious material Able to tolerate:  Liquids Progression:  Worsening Chronicity:  New Recent urination:  Normal Relieved by:  Nothing Worsened by:  Nothing Ineffective treatments:  None tried Associated symptoms: no abdominal pain, no cough, no diarrhea and no headaches        Home Medications Prior to Admission medications   Medication Sig Start Date End Date Taking? Authorizing Provider  ondansetron  (ZOFRAN -ODT) 4 MG disintegrating tablet 4mg  ODT q4 hours prn nausea/vomit 11/04/23  Yes Lacara Dunsworth, MD  ACCU-CHEK AVIVA PLUS test strip USE TO CHECK BLOOD SUGARS 1-2 TIMES DAILY. DX E11.9 06/09/22   Albertha Huger, FNP  aspirin EC 81 MG tablet Take 81 mg by mouth daily.    [provider]  Biotin 1000 MCG tablet Take 1,000 mcg by mouth daily.    [provider]  cetirizine  (ZYRTEC ) 10 MG tablet Take 1 tablet (10 mg total) by mouth daily. 11/03/21   Zorita Hiss, FNP  Cholecalciferol 50 MCG (2000 UT) CAPS Take 2,000 Units by mouth.    [provider]  Cyanocobalamin (B-12 PO) Take by mouth.    [provider]  dapagliflozin  propanediol (FARXIGA ) 10 MG TABS tablet Take 1 tablet (10 mg total) by mouth daily. 08/17/23   Albertha Huger, FNP  ferrous sulfate  324 MG TBEC Take 324 mg by mouth.    [provider]  fluticasone  (FLONASE ) 50 MCG/ACT nasal spray Place 2 sprays into both nostrils daily. 04/26/23   Gaylyn Keas, Mary-Margaret, FNP  lidocaine  (LIDODERM ) 5 % lidocaine  5 % topical patch  PLACE 1 PATCH ONTO THE SKIN DAILY. REMOVE & DISCARD PATCH WITHIN  12 HOURS OR AS DIRECTED BY MD    [provider]  lisinopril -hydrochlorothiazide  (ZESTORETIC ) 20-25 MG tablet TAKE 1 TABLET BY MOUTH DAILY 11/02/23   Milian, Winda Hastings, FNP  lubiprostone  (AMITIZA ) 8 MCG capsule Take 1 capsule (8 mcg total) by mouth 2 (two) times daily with a meal. 08/17/23   Albertha Huger, FNP  Magnesium  Oxide (MAG-200 PO) Take by mouth daily at 6 (six) AM.    [provider]  montelukast  (SINGULAIR ) 10 MG tablet Take 1 tablet (10 mg total) by mouth at bedtime. 08/17/23   Albertha Huger, FNP  Multiple Vitamins-Minerals (MULTIVITAMIN WITH MINERALS) tablet Take 1 tablet by mouth daily.    [provider]  nystatin -triamcinolone  (MYCOLOG II) cream Apply 1 application topically 2 (two) times daily. 01/20/21   Rehman, Mathews Solomons, MD  Omega-3 Fatty Acids (MAXEPA PO) Take 500 mg by mouth daily at 6 (six) AM.    [provider]  omeprazole  (PRILOSEC) 20 MG capsule TAKE 1 CAPSULE (20 MG TOTAL) BY MOUTH DAILY AS NEEDED. TAKES AS NEEDED 10/27/23   Carlan, Chelsea L, NP  psyllium (METAMUCIL) 58.6 % packet Take 1 packet by mouth daily.    [provider]  sulfamethoxazole -trimethoprim  (BACTRIM  DS) 800-160 MG tablet Take 1 tablet by mouth 2 (two) times daily. 10/27/23   Delfina Feller, FNP  triamcinolone  cream (KENALOG ) 0.5 % APPLY TOPICALLY 3 TIMES A DAY 06/09/22  Albertha Huger, FNP      Allergies    Patient has no known allergies.    Review of Systems   Review of Systems  Constitutional:  Negative for appetite change and fatigue.  HENT:  Negative for congestion, ear discharge and sinus pressure.   Eyes:  Negative for discharge.  Respiratory:  Negative for cough.   Cardiovascular:  Negative for chest pain.  Gastrointestinal:  Positive for vomiting. Negative for abdominal pain and diarrhea.  Genitourinary:  Negative for frequency and hematuria.  Musculoskeletal:  Negative for back pain.  Skin:  Negative for rash.   Neurological:  Negative for seizures and headaches.  Psychiatric/Behavioral:  Negative for hallucinations.     Physical Exam Updated Vital Signs BP (!) 141/109   Pulse (!) 101   Temp 98.2 F (36.8 C) (Oral)   Resp 18   SpO2 95%  Physical Exam Vitals and nursing note reviewed.  Constitutional:      Appearance: She is well-developed.  HENT:     Head: Normocephalic.     Nose: Nose normal.  Eyes:     General: No scleral icterus.    Conjunctiva/sclera: Conjunctivae normal.  Neck:     Thyroid : No thyromegaly.  Cardiovascular:     Rate and Rhythm: Normal rate and regular rhythm.     Heart sounds: No murmur heard.    No friction rub. No gallop.  Pulmonary:     Breath sounds: No stridor. No wheezing or rales.  Chest:     Chest wall: No tenderness.  Abdominal:     General: There is no distension.     Tenderness: There is no abdominal tenderness. There is no rebound.  Musculoskeletal:        General: Normal range of motion.     Cervical back: Neck supple.  Lymphadenopathy:     Cervical: No cervical adenopathy.  Skin:    Findings: No erythema or rash.  Neurological:     Mental Status: She is alert and oriented to person, place, and time.     Motor: No abnormal muscle tone.     Coordination: Coordination normal.  Psychiatric:        Behavior: Behavior normal.     ED Results / Procedures / Treatments   Labs (all labs ordered are listed, but only abnormal results are displayed) Labs Reviewed  CBC WITH DIFFERENTIAL/PLATELET - Abnormal; Notable for the following components:      Result Value   WBC 11.7 (*)    RBC 3.86 (*)    Hemoglobin 10.6 (*)    HCT 34.0 (*)    RDW 16.4 (*)    Neutro Abs 11.1 (*)    Lymphs Abs 0.2 (*)    All other components within normal limits  COMPREHENSIVE METABOLIC PANEL WITH GFR - Abnormal; Notable for the following components:   CO2 19 (*)    Glucose, Bld 127 (*)    BUN 34 (*)    Creatinine, Ser 1.67 (*)    Albumin 3.4 (*)    GFR,  Estimated 29 (*)    All other components within normal limits    EKG None  Radiology No results found.  Procedures Procedures    Medications Ordered in ED Medications  sodium chloride  0.9 % bolus 1,000 mL (0 mLs Intravenous Stopped 11/04/23 1431)  acetaminophen  (TYLENOL ) tablet 650 mg (650 mg Oral Given 11/04/23 1443)  ketorolac  (TORADOL ) 30 MG/ML injection 15 mg (15 mg Intravenous Given 11/04/23 1458)    ED  Course/ Medical Decision Making/ A&P                                 Medical Decision Making Amount and/or Complexity of Data Reviewed Labs: ordered.  Risk OTC drugs. Prescription drug management.   Pt with no abd pain.  She has been hydrated and will follow up for nvd.  Pt sent home with zofran         Final Clinical Impression(s) / ED Diagnoses Final diagnoses:  Nausea vomiting and diarrhea    Rx / DC Orders ED Discharge Orders          Ordered    ondansetron  (ZOFRAN -ODT) 4 MG disintegrating tablet        11/04/23 1504              Cheyenne Cotta, MD 11/08/23 [phone]

## 2023-11-08 NOTE — Telephone Encounter (Signed)
 Chief Complaint: Lip swelling Symptoms: Upper lip swelling Frequency: Onset Monday Pertinent Negatives: Patient denies pain, itching Disposition: [] ED /[] Urgent Care (no appt availability in office) / [x] Appointment(In office/virtual)/ []  [place]/ [] Home Care/ [] Refused Recommended Disposition /[] [place]/ []  Follow-up with PCP Additional Notes: Patient says she was getting over a virus last week and noticed her upper lip felt funny on Sunday, then noticed the swelling on Monday. She says it appears to be on one side of the lip when she looks in the mirror, but it's the entire upper lip that's swollen. Advised OV tomorrow with DOD provider, she agrees.    Copied from CRM [id number]. Topic: Clinical - Red Word Triage >> Nov 08, 2023  3:48 PM Ivette P wrote: Red Word that prompted transfer to Nurse Triage: swelling in the lop. Fever blister not coming out Reason for Disposition  [1] Mild lip swelling from food reaction AND [2] diagnosis never confirmed by a doctor (or NP/PA)  Answer Assessment - Initial Assessment Questions 1. ONSET: "When did the swelling start?" (e.g., minutes, hours, days)     Sunday night it was feeling funny, Monday it was swollen  2. SEVERITY: "How swollen is it?"     Upper lip part is swollen, not able to determine severity 3. ITCHING: "Is there any itching?" If Yes, ask: "How much?"   (Scale 1-10; mild, moderate or severe)     No 4. PAIN: "Is the swelling painful to touch?" If Yes, ask: "How painful is it?"   (Scale 1-10; mild, moderate or severe)     No 5. CAUSE: "What do you think is causing the lip swelling?"     Possible fever blister 6. RECURRENT SYMPTOM: "Have you had lip swelling before?" If Yes, ask: "When was the last time?" "What happened that time?"     No 7. OTHER SYMPTOMS: "Do you have any other symptoms?" (e.g., toothache)     No  Protocols used: Lip Swelling-A-AH

## 2023-11-09 ENCOUNTER — Ambulatory Visit (INDEPENDENT_AMBULATORY_CARE_PROVIDER_SITE_OTHER): Admitting: Family Medicine

## 2023-11-09 ENCOUNTER — Encounter: Payer: Self-pay | Admitting: Family Medicine

## 2023-11-09 VITALS — BP 120/68 | HR 65 | Temp 98.4°F | Ht 63.0 in | Wt 161.0 lb

## 2023-11-09 DIAGNOSIS — R220 Localized swelling, mass and lump, head: Secondary | ICD-10-CM

## 2023-11-09 DIAGNOSIS — I10 Essential (primary) hypertension: Secondary | ICD-10-CM | POA: Diagnosis not present

## 2023-11-09 MED ORDER — LOSARTAN POTASSIUM 25 MG PO TABS: 25.0000 mg | ORAL_TABLET | Freq: Every day | ORAL | 0 refills | Status: DC

## 2023-11-09 NOTE — Patient Instructions (Addendum)
 STOP LISINOPRIL  -hydrochlorothiazide 

## 2023-11-09 NOTE — Progress Notes (Signed)
 Subjective:  Patient ID: Susan Haney, female    DOB: 05-27-1932, 89 y.o.   MRN: [id number]  Patient Care Team: Albertha Huger, FNP as PCP - General (Family Medicine) Alexia Idler, OD (Optometry) Alpheus Arvin Donata Fryer, [facility] as [facility] Care Management (Pharmacist) Adonica Hoose, MD as Consulting Physician (Orthopedic Surgery) Patterson Bora, NP as Nurse Practitioner (Gastroenterology)   Chief Complaint:  Oral Swelling (X 2 days/)   HPI: Susan Haney is a 89 y.o. female presenting on 11/09/2023 for Oral Swelling (X 2 days/)  HPI States that she woke up 2 days ago with her upper lip swelling. Believed that it was fever blister starting. She put some medication on it. A fever blister has not improved. Her right side of upper lip has improved. Reports that this morning she woke up and her lower lip felt swollen as well.  Denies pain or trouble breathing. States that the "entrance to her nose" feels dry.  Reports that this past weekend she had decreased appetite due to virus and was seen in ED or dehydration. She took zofran , this is the only new medication she has taken. She is taking lisinopril -hydrochlorothiazide . She also notes that she just completed a course of sulfamethozazole-TMP.   Relevant past medical, surgical, family, and social history reviewed and updated as indicated.  Allergies and medications reviewed and updated. Data reviewed: Chart in Epic.   Past Medical History:  Diagnosis Date   Allergy    Anemia    history ,  took iron for years   Arthritis    Bicuspid aortic valve    Cancer (HCC) 2017   endometrial cancer / cervical   Cataract    Cellulitis 12/31/2017   BLE, ankles   Chronic back pain    Chronic kidney disease    DDD (degenerative disc disease), lumbar    Diabetes mellitus without complication (HCC)    Endometrial cancer (HCC)    GERD (gastroesophageal reflux disease)    History of radiation therapy 06/15/16-07/06/16    vaginal cuff 30 Gy in 5 fractions   Hypertension    Neuromuscular disorder (HCC)    Prediabetes    Spondylosis    lumbar    Past Surgical History:  Procedure Laterality Date   ABDOMINAL HYSTERECTOMY     BUNIONECTOMY     CARPAL TUNNEL RELEASE Right 05/30/2018   Procedure: RIGHT CARPAL TUNNEL RELEASE;  Surgeon: Lyanne Sample, MD;  Location: [facility];  Service: Orthopedics;  Laterality: Right;   COLONOSCOPY N/A 04/04/2014   Procedure: COLONOSCOPY;  Surgeon: Ruby Corporal, MD;  Location: [facility];  Service: Endoscopy;  Laterality: N/A;  100   EYE SURGERY     bil cataracts and lens implants   FOOT SURGERY     Left   FRACTURE SURGERY     JOINT REPLACEMENT     Ovary removed: benign     REPLACEMENT TOTAL KNEE BILATERAL     2013   ROBOTIC ASSISTED TOTAL HYSTERECTOMY WITH BILATERAL SALPINGO OOPHERECTOMY Left 04/13/2016   Procedure: XI ROBOTIC ASSISTED TOTAL HYSTERECTOMY WITH LEFT SALPINGO OOPHORECTOMY SENTINEL LYMPH NODE BIOPSY;  Surgeon: Alphonso Aschoff, MD;  Location: [facility];  Service: Gynecology;  Laterality: Left;   TOTAL KNEE REVISION Left 02/12/2016   Procedure: LEFT TOTAL KNEE REVISION;  Surgeon: Adonica Hoose, MD;  Location: [facility];  Service: Orthopedics;  Laterality: Left;    Social History   Socioeconomic History   Marital status: Widowed  Spouse name: Not on file   Number of children: 2   Years of education: Not on file   Highest education level: Associate degree: occupational, Scientist, product/process development, or vocational program  Occupational History   Occupation: retired  Tobacco Use   Smoking status: Never    Passive exposure: Past   Smokeless tobacco: Never  Vaping Use   Vaping status: Never Used  Substance and Sexual Activity   Alcohol use: No   Drug use: No   Sexual activity: Not Currently    Birth control/protection: None  Other Topics Concern   Not on file  Social History Narrative   2 sons - in [place] and [place]   Disabled sister lives close by    Social Drivers of [employer] Strain: Low Risk  (09/19/2023)   Overall Financial Resource Strain (CARDIA)    Difficulty of Paying Living Expenses: Not hard at all  Food Insecurity: No Food Insecurity (09/19/2023)   Hunger Vital Sign    Worried About Running Out of Food in the Last Year: Never true    Ran Out of Food in the Last Year: Never true  Transportation Needs: No Transportation Needs (09/19/2023)   PRAPARE - Administrator, Civil Service (Medical): No    Lack of Transportation (Non-Medical): No  Physical Activity: Insufficiently Active (09/19/2023)   Exercise Vital Sign    Days of Exercise per Week: 4 days    Minutes of Exercise per Session: 30 min  Stress: No Stress Concern Present (09/19/2023)   [employer] of Occupational Health - Occupational Stress Questionnaire    Feeling of Stress : Only a little  Social Connections: Moderately Integrated (09/19/2023)   Social Connection and Isolation Panel [NHANES]    Frequency of Communication with Friends and Family: More than three times a week    Frequency of Social Gatherings with Friends and Family: Once a week    Attends Religious Services: More than 4 times per year    Active Member of [employer] or Organizations: Yes    Attends Banker Meetings: More than 4 times per year    Marital Status: Widowed  Intimate Partner Violence: Not At Risk (06/16/2022)   Humiliation, Afraid, Rape, and Kick questionnaire    Fear of Current or Ex-Partner: No    Emotionally Abused: No    Physically Abused: No    Sexually Abused: No    Outpatient Encounter Medications as of 11/09/2023  Medication Sig   ACCU-CHEK AVIVA PLUS test strip USE TO CHECK BLOOD SUGARS 1-2 TIMES DAILY. DX E11.9   aspirin EC 81 MG tablet Take 81 mg by mouth daily.   Biotin 1000 MCG tablet Take 1,000 mcg by mouth daily.   cetirizine  (ZYRTEC ) 10 MG tablet Take 1 tablet (10 mg total) by mouth daily.   Cholecalciferol 50 MCG (2000 UT) CAPS  Take 2,000 Units by mouth.   Cyanocobalamin (B-12 PO) Take by mouth.   dapagliflozin  propanediol (FARXIGA ) 10 MG TABS tablet Take 1 tablet (10 mg total) by mouth daily.   ferrous sulfate  324 MG TBEC Take 324 mg by mouth.   fluticasone  (FLONASE ) 50 MCG/ACT nasal spray Place 2 sprays into both nostrils daily.   lidocaine  (LIDODERM ) 5 % lidocaine  5 % topical patch  PLACE 1 PATCH ONTO THE SKIN DAILY. REMOVE & DISCARD PATCH WITHIN 12 HOURS OR AS DIRECTED BY MD   lisinopril -hydrochlorothiazide  (ZESTORETIC ) 20-25 MG tablet TAKE 1 TABLET BY MOUTH DAILY   lubiprostone  (AMITIZA ) 8 MCG  capsule Take 1 capsule (8 mcg total) by mouth 2 (two) times daily with a meal.   Magnesium  Oxide (MAG-200 PO) Take by mouth daily at 6 (six) AM.   montelukast  (SINGULAIR ) 10 MG tablet Take 1 tablet (10 mg total) by mouth at bedtime.   Multiple Vitamins-Minerals (MULTIVITAMIN WITH MINERALS) tablet Take 1 tablet by mouth daily.   nystatin -triamcinolone  (MYCOLOG II) cream Apply 1 application topically 2 (two) times daily.   Omega-3 Fatty Acids (MAXEPA PO) Take 500 mg by mouth daily at 6 (six) AM.   omeprazole  (PRILOSEC) 20 MG capsule TAKE 1 CAPSULE (20 MG TOTAL) BY MOUTH DAILY AS NEEDED. TAKES AS NEEDED   ondansetron  (ZOFRAN -ODT) 4 MG disintegrating tablet 4mg  ODT q4 hours prn nausea/vomit   psyllium (METAMUCIL) 58.6 % packet Take 1 packet by mouth daily.   triamcinolone  cream (KENALOG ) 0.5 % APPLY TOPICALLY 3 TIMES A DAY   sulfamethoxazole -trimethoprim  (BACTRIM  DS) 800-160 MG tablet Take 1 tablet by mouth 2 (two) times daily. (Patient not taking: Reported on 11/09/2023)   No facility-administered encounter medications on file as of 11/09/2023.    No Known Allergies  Review of Systems As per HPI  Objective:  BP 120/68   Pulse 65   Temp 98.4 F (36.9 C)   Ht 5\' 3"  (1.6 m)   Wt 161 lb (73 kg)   SpO2 98%   BMI 28.52 kg/m    Wt Readings from Last 3 Encounters:  11/09/23 161 lb (73 kg)  10/27/23 164 lb (74.4 kg)   10/05/23 166 lb (75.3 kg)   Physical Exam Constitutional:      General: She is awake. She is not in acute distress.    Appearance: Normal appearance.  HENT:     Mouth/Throat:     Comments: Left upper lip and lower lip swollen, not erythematous Neurological:     Mental Status: She is alert and easily aroused.  Psychiatric:        Behavior: Behavior is cooperative.    Results for orders placed or performed during the hospital encounter of 11/04/23  CBC with Differential   Collection Time: 11/04/23  1:20 PM  Result Value Ref Range   WBC 11.7 (H) 4.0 - 10.5 K/uL   RBC 3.86 (L) 3.87 - 5.11 MIL/uL   Hemoglobin 10.6 (L) 12.0 - 15.0 g/dL   HCT 10.2 (L) 72.5 - 36.6 %   MCV 88.1 80.0 - 100.0 fL   MCH 27.5 26.0 - 34.0 pg   MCHC 31.2 30.0 - 36.0 g/dL   RDW 44.0 (H) 34.7 - 42.5 %   Platelets 296 150 - 400 K/uL   nRBC 0.0 0.0 - 0.2 %   Neutrophils Relative % 96 %   Neutro Abs 11.1 (H) 1.7 - 7.7 K/uL   Lymphocytes Relative 2 %   Lymphs Abs 0.2 (L) 0.7 - 4.0 K/uL   Monocytes Relative 2 %   Monocytes Absolute 0.3 0.1 - 1.0 K/uL   Eosinophils Relative 0 %   Eosinophils Absolute 0.0 0.0 - 0.5 K/uL   Basophils Relative 0 %   Basophils Absolute 0.0 0.0 - 0.1 K/uL   Immature Granulocytes 0 %   Abs Immature Granulocytes 0.05 0.00 - 0.07 K/uL  Comprehensive metabolic panel   Collection Time: 11/04/23  1:20 PM  Result Value Ref Range   Sodium 136 135 - 145 mmol/L   Potassium 4.2 3.5 - 5.1 mmol/L   Chloride 110 98 - 111 mmol/L   CO2 19 (L) 22 - 32  mmol/L   Glucose, Bld 127 (H) 70 - 99 mg/dL   BUN 34 (H) 8 - 23 mg/dL   Creatinine, Ser 1.61 (H) 0.44 - 1.00 mg/dL   Calcium 9.2 8.9 - 09.6 mg/dL   Total Protein 6.7 6.5 - 8.1 g/dL   Albumin 3.4 (L) 3.5 - 5.0 g/dL   AST 18 15 - 41 U/L   ALT 13 0 - 44 U/L   Alkaline Phosphatase 44 38 - 126 U/L   Total Bilirubin 0.5 0.0 - 1.2 mg/dL   GFR, Estimated 29 (L) >60 mL/min   Anion gap 7 5 - 15       11/09/2023   10:44 AM 10/27/2023   11:25 AM  10/05/2023   11:13 AM 09/20/2023   11:51 AM 09/14/2023    4:06 PM  Depression screen PHQ 2/9  Decreased Interest 0 0 0 0 0  Down, Depressed, Hopeless 0 0 0 0 0  PHQ - 2 Score 0 0 0 0 0  Altered sleeping 0  3 3 1   Tired, decreased energy 1  2 2  0  Change in appetite 0  0 0 0  Feeling bad or failure about yourself  0  0 0 0  Trouble concentrating 0  0 0 0  Moving slowly or fidgety/restless 0  0 0 0  Suicidal thoughts 0  0 0 0  PHQ-9 Score 1  5 5 1   Difficult doing work/chores Somewhat difficult  Somewhat difficult Not difficult at all        11/09/2023   10:44 AM 10/05/2023   11:14 AM 09/20/2023   11:51 AM 09/14/2023    4:06 PM  GAD 7 : Generalized Anxiety Score  Nervous, Anxious, on Edge 1 1 2 1   Control/stop worrying 0 0 0 0  Worry too much - different things 0 0 0 0  Trouble relaxing 0 0 0 0  Restless 0 0 0 0  Easily annoyed or irritable 0 0 0 0  Afraid - awful might happen 0 0 0 0  Total GAD 7 Score 1 1 2 1   Anxiety Difficulty Somewhat difficult Somewhat difficult Not difficult at all Somewhat difficult   Pertinent labs & imaging results that were available during my care of the patient were reviewed by me and considered in my medical decision making.  Assessment & Plan:  Rolonda was seen today for oral swelling.  Diagnoses and all orders for this visit:  Swelling of both lips Neuro evaluation reassuring. Discussed red flag symptoms with patient. Discussed that angioedema with sulfa  abx typically happens within the first few hours of taking. Will stop lisinopril  and switch to losartan  as below. Patient has follow up with PCP on Monday. Will have her keep follow up for BP evaluation and lip evaluation.  -     losartan  (COZAAR ) 25 MG tablet; Take 1 tablet (25 mg total) by mouth daily.  Primary hypertension As above.  -     losartan  (COZAAR ) 25 MG tablet; Take 1 tablet (25 mg total) by mouth daily.     Continue all other maintenance medications.  Follow up plan: Return  for has appt with PCP on monday .   Continue healthy lifestyle choices, including diet (rich in fruits, vegetables, and lean proteins, and low in salt and simple carbohydrates) and exercise (at least 30 minutes of moderate physical activity daily).  Written and verbal instructions provided   The above assessment and management plan was discussed with the patient. The patient  verbalized understanding of and has agreed to the management plan. Patient is aware to call the clinic if they develop any new symptoms or if symptoms persist or worsen. Patient is aware when to return to the clinic for a follow-up visit. Patient educated on when it is appropriate to go to the emergency department.   Jacqualyn Mates, DNP-FNP [facility] [address] [place], [place] [address] [phone]

## 2023-11-14 ENCOUNTER — Ambulatory Visit (INDEPENDENT_AMBULATORY_CARE_PROVIDER_SITE_OTHER): Payer: Medicare Other

## 2023-11-14 ENCOUNTER — Encounter: Payer: Self-pay | Admitting: Family Medicine

## 2023-11-14 ENCOUNTER — Ambulatory Visit (INDEPENDENT_AMBULATORY_CARE_PROVIDER_SITE_OTHER): Payer: Medicare Other | Admitting: Family Medicine

## 2023-11-14 VITALS — BP 119/76 | HR 70 | Temp 98.0°F | Ht 63.0 in | Wt 163.2 lb

## 2023-11-14 DIAGNOSIS — R220 Localized swelling, mass and lump, head: Secondary | ICD-10-CM

## 2023-11-14 DIAGNOSIS — K219 Gastro-esophageal reflux disease without esophagitis: Secondary | ICD-10-CM

## 2023-11-14 DIAGNOSIS — Z780 Asymptomatic menopausal state: Secondary | ICD-10-CM

## 2023-11-14 DIAGNOSIS — R7303 Prediabetes: Secondary | ICD-10-CM | POA: Diagnosis not present

## 2023-11-14 DIAGNOSIS — S80811D Abrasion, right lower leg, subsequent encounter: Secondary | ICD-10-CM | POA: Diagnosis not present

## 2023-11-14 DIAGNOSIS — N1832 Chronic kidney disease, stage 3b: Secondary | ICD-10-CM

## 2023-11-14 DIAGNOSIS — I10 Essential (primary) hypertension: Secondary | ICD-10-CM

## 2023-11-14 DIAGNOSIS — E559 Vitamin D deficiency, unspecified: Secondary | ICD-10-CM

## 2023-11-14 LAB — CBC WITH DIFFERENTIAL/PLATELET
Basophils Absolute: 0.1 10*3/uL (ref 0.0–0.2)
Basos: 1 %
EOS (ABSOLUTE): 0.4 10*3/uL (ref 0.0–0.4)
Eos: 7 %
Hematocrit: 33.8 % — ABNORMAL LOW (ref 34.0–46.6)
Hemoglobin: 10.7 g/dL — ABNORMAL LOW (ref 11.1–15.9)
Immature Grans (Abs): 0 10*3/uL (ref 0.0–0.1)
Immature Granulocytes: 0 %
Lymphocytes Absolute: 1.9 10*3/uL (ref 0.7–3.1)
Lymphs: 29 %
MCH: 27.7 pg (ref 26.6–33.0)
MCHC: 31.7 g/dL (ref 31.5–35.7)
MCV: 88 fL (ref 79–97)
Monocytes Absolute: 0.5 10*3/uL (ref 0.1–0.9)
Monocytes: 8 %
Neutrophils Absolute: 3.6 10*3/uL (ref 1.4–7.0)
Neutrophils: 55 %
Platelets: 357 10*3/uL (ref 150–450)
RBC: 3.86 x10E6/uL (ref 3.77–5.28)
RDW: 14.6 % (ref 11.7–15.4)
WBC: 6.5 10*3/uL (ref 3.4–10.8)

## 2023-11-14 LAB — BMP8+EGFR
BUN/Creatinine Ratio: 17 (ref 12–28)
BUN: 17 mg/dL (ref 10–36)
CO2: 21 mmol/L (ref 20–29)
Calcium: 9.1 mg/dL (ref 8.7–10.3)
Chloride: 108 mmol/L — ABNORMAL HIGH (ref 96–106)
Creatinine, Ser: 0.98 mg/dL (ref 0.57–1.00)
Glucose: 103 mg/dL — ABNORMAL HIGH (ref 70–99)
Potassium: 4.8 mmol/L (ref 3.5–5.2)
Sodium: 144 mmol/L (ref 134–144)
eGFR: 55 mL/min/{1.73_m2} — ABNORMAL LOW (ref >59)

## 2023-11-14 LAB — BAYER DCA HB A1C WAIVED: HB A1C (BAYER DCA - WAIVED): 5.3 % (ref 4.8–5.6)

## 2023-11-14 MED ORDER — LOSARTAN POTASSIUM 25 MG PO TABS
12.5000 mg | ORAL_TABLET | Freq: Every day | ORAL | 3 refills | Status: AC
Start: 2023-11-14 — End: ?

## 2023-11-14 NOTE — Progress Notes (Signed)
 Established Patient Office Visit  Subjective   Patient ID: Susan Haney, female    DOB: May 10, 1932  Age: 89 y.o. MRN: [id number]  Chief Complaint  Patient presents with   Medical Management of Chronic Issues    HPI HTN Complaint with meds - Yes Current Medications - losartan  Checking BP at home: 80/50s-100s/70s. Was good yesterday and today. Switched from lisinopril  last week after swelling of lips. Swelling has now resolved.  Exercising Regularly - she was walking daily- not this past week after being ill Pertinent ROS:  Fatigue - yes, since being ill last week Visual Disturbances - No Chest pain - No Dyspnea - No Palpitations - No LE edema - mild, baseline  2. Prediabetes Last a1c was 5.7. She is on farxiga  for CKD. Fasting blood sugars have been <120.  3.GERD/constipation Saw GI about 3 weeks ago. Did have positive fecal occult blood. They have discussed EGD/colonoscopy. She doesn't wish to do this however. Denies lightheadedness, dizziness, syncope. Taking iron supplement.   4. Right lower leg Bumped into dishwater door about 3 weeks ago. Pink around the scab. Denies pain or tenderness. No drainage. No fever.       11/14/2023    7:55 AM 11/09/2023   10:44 AM 10/27/2023   11:25 AM  Depression screen PHQ 2/9  Decreased Interest 0 0 0  Down, Depressed, Hopeless 0 0 0  PHQ - 2 Score 0 0 0  Altered sleeping 0 0   Tired, decreased energy 0 1   Change in appetite 0 0   Feeling bad or failure about yourself  0 0   Trouble concentrating 0 0   Moving slowly or fidgety/restless 0 0   Suicidal thoughts 0 0   PHQ-9 Score 0 1   Difficult doing work/chores Not difficult at all Somewhat difficult       11/14/2023    7:55 AM 11/09/2023   10:44 AM 10/05/2023   11:14 AM 09/20/2023   11:51 AM  GAD 7 : Generalized Anxiety Score  Nervous, Anxious, on Edge 0 1 1 2   Control/stop worrying 0 0 0 0  Worry too much - different things 0 0 0 0  Trouble relaxing 0 0 0 0  Restless 0  0 0 0  Easily annoyed or irritable 0 0 0 0  Afraid - awful might happen 0 0 0 0  Total GAD 7 Score 0 1 1 2   Anxiety Difficulty Not difficult at all Somewhat difficult Somewhat difficult Not difficult at all     Past Medical History:  Diagnosis Date   Allergy    Anemia    history ,  took iron for years   Arthritis    Bicuspid aortic valve    Cancer (HCC) 2017   endometrial cancer / cervical   Cataract    Cellulitis 12/31/2017   BLE, ankles   Chronic back pain    Chronic kidney disease    DDD (degenerative disc disease), lumbar    Diabetes mellitus without complication (HCC)    Endometrial cancer (HCC)    GERD (gastroesophageal reflux disease)    History of radiation therapy 06/15/16-07/06/16   vaginal cuff 30 Gy in 5 fractions   Hypertension    Neuromuscular disorder (HCC)    Prediabetes    Spondylosis    lumbar      ROS As per HPI.    Objective:     BP 119/76 Comment: at home reading per pt  Pulse 70  Temp 98 F (36.7 C) (Temporal)   Ht 5\' 3"  (1.6 m)   Wt 163 lb 3.2 oz (74 kg)   SpO2 99%   BMI 28.91 kg/m  BP Readings from Last 3 Encounters:  11/14/23 119/76  11/09/23 120/68  11/04/23 (!) 141/109   BP Readings from Last 3 Encounters:  11/14/23 119/76  11/09/23 120/68  11/04/23 (!) 141/109     Physical Exam Vitals and nursing note reviewed.  Constitutional:      General: She is not in acute distress.    Appearance: She is not ill-appearing, toxic-appearing or diaphoretic.  HENT:     Head: Normocephalic and atraumatic.     Mouth/Throat:     Mouth: Mucous membranes are moist. No angioedema.     Pharynx: Oropharynx is clear.  Cardiovascular:     Rate and Rhythm: Normal rate and regular rhythm.     Heart sounds: Normal heart sounds. No murmur heard. Pulmonary:     Effort: Pulmonary effort is normal. No respiratory distress.     Breath sounds: Normal breath sounds.  Abdominal:     General: Bowel sounds are normal. There is no distension.      Palpations: Abdomen is soft.     Tenderness: There is no abdominal tenderness. There is no guarding or rebound.  Musculoskeletal:     Cervical back: No rigidity.     Right lower leg: No edema.     Left lower leg: No edema.  Skin:    General: Skin is warm and dry.     Findings: Abrasion (right anterior lower leg with scabbing present. No erythema, tenderness, warmth or exudate.) present.  Neurological:     General: No focal deficit present.     Mental Status: She is alert and oriented to person, place, and time.  Psychiatric:        Mood and Affect: Mood normal.        Behavior: Behavior normal.    No results found for any visits on 11/14/23.    The ASCVD Risk score (Arnett DK, et al., 2019) failed to calculate for the following reasons:   The 2019 ASCVD risk score is only valid for ages 75 to 44    Assessment & Plan:   [name] was seen today for medical management of chronic issues.  Diagnoses and all orders for this visit:  Prediabetes A1c well controlled at 5.3. -     Bayer DCA Hb A1c Waived  Primary hypertension BP has been soft. Reduce losartan  to 0.5 tablet. Monitor BP at home and notify and for high or low readings.  -     BMP8+EGFR -     CBC with Differential/Platelet -     losartan  (COZAAR ) 25 MG tablet; Take 0.5 tablets (12.5 mg total) by mouth daily.  Stage 3b chronic kidney disease (HCC) On farxiga  and ARB.   Vitamin D  deficiency Discussed OTC supplement.   Gastroesophageal reflux disease, unspecified whether esophagitis present Well controlled on current regimen.   Swelling of both lips Resolved.   Abrasion of right lower leg, subsequent encounter No signs of infection. Discussed home wound care.    Return in about 3 months (around 02/13/2024) for chronic follow up.   The patient indicates understanding of these issues and agrees with the plan.  Albertha Huger, FNP

## 2023-11-16 ENCOUNTER — Ambulatory Visit: Payer: Self-pay

## 2023-11-16 NOTE — Telephone Encounter (Signed)
 Copied from CRM [id number]. Topic: Clinical - Red Word Triage >> Nov 16, 2023  2:09 PM Blair Bumpers wrote: Red Word that prompted transfer to Nurse Triage: Patient states this morning she was doing somethings around the house and she thinks she may have pulled something. States her left leg & hip has pain radiating down the leg & her hips is still hurting. Wants to see provider.   Chief Complaint: Muscle Pain, Hip and Leg Symptoms: Shooting pain down the leg, numbness,  Frequency: This Morning  Pertinent Negatives: Patient denies inability to walk  Disposition: [] ED /[] Urgent Care (no appt availability in office) / [x] Appointment(In office/virtual)/ []  [facility]/ [] Home Care/ [] Refused Recommended Disposition /[] [facility]/ []  Follow-up with PCP  Additional Notes: This morning, the patient was gardening and believes she pulled something in her hip. Experiencing sharp pain from her hip down to the ankle.  Made an appointment for tomorrow morning.   Reason for Disposition  Numbness in a leg or foot (i.e., loss of sensation)  Answer Assessment - Initial Assessment Questions 1. LOCATION and RADIATION: "Where is the pain located?"      Left Hip, radiating down to the ankle.   2. QUALITY: "What does the pain feel like?"  (e.g., sharp, dull, aching, burning)     Sharp  3. SEVERITY: "How bad is the pain?" "What does it keep you from doing?"   (Scale 1-10; or mild, moderate, severe)   -  MILD (1-3): doesn't interfere with normal activities    -  MODERATE (4-7): interferes with normal activities (e.g., work or school) or awakens from sleep, limping    -  SEVERE (8-10): excruciating pain, unable to do any normal activities, unable to walk     8  4. ONSET: "When did the pain start?" "Does it come and go, or is it there all the time?"     This morning  5. WORK OR EXERCISE: "Has there been any recent work or exercise that involved this part of the body?"      Gardening,  pullling a bag of soil.  6. CAUSE: "What do you think is causing the hip pain?"      Overextension  7. AGGRAVATING FACTORS: "What makes the hip pain worse?" (e.g., walking, climbing stairs, running)     Walking, Standing  8. OTHER SYMPTOMS: "Do you have any other symptoms?" (e.g., back pain, pain shooting down leg,  fever, rash)     Numbness and Tingling, Shooting Leg Pain  Protocols used: Hip Pain-A-AH

## 2023-11-16 NOTE — Telephone Encounter (Signed)
 Appt made

## 2023-11-17 ENCOUNTER — Encounter: Payer: Self-pay | Admitting: Family Medicine

## 2023-11-17 ENCOUNTER — Ambulatory Visit (INDEPENDENT_AMBULATORY_CARE_PROVIDER_SITE_OTHER): Admitting: Family Medicine

## 2023-11-17 VITALS — BP 133/59 | HR 66 | Temp 98.3°F | Ht 63.0 in | Wt 164.8 lb

## 2023-11-17 DIAGNOSIS — N1832 Chronic kidney disease, stage 3b: Secondary | ICD-10-CM | POA: Diagnosis not present

## 2023-11-17 DIAGNOSIS — M5442 Lumbago with sciatica, left side: Secondary | ICD-10-CM | POA: Diagnosis not present

## 2023-11-17 DIAGNOSIS — Z780 Asymptomatic menopausal state: Secondary | ICD-10-CM | POA: Diagnosis not present

## 2023-11-17 DIAGNOSIS — R600 Localized edema: Secondary | ICD-10-CM

## 2023-11-17 DIAGNOSIS — M85832 Other specified disorders of bone density and structure, left forearm: Secondary | ICD-10-CM | POA: Diagnosis not present

## 2023-11-17 DIAGNOSIS — M25552 Pain in left hip: Secondary | ICD-10-CM

## 2023-11-17 DIAGNOSIS — I10 Essential (primary) hypertension: Secondary | ICD-10-CM

## 2023-11-17 DIAGNOSIS — R002 Palpitations: Secondary | ICD-10-CM

## 2023-11-17 MED ORDER — PREDNISONE 20 MG PO TABS: 20.0000 mg | ORAL_TABLET | Freq: Every day | ORAL | 0 refills | Status: AC

## 2023-11-17 MED ORDER — FUROSEMIDE 20 MG PO TABS: 20.0000 mg | ORAL_TABLET | Freq: Every day | ORAL | 3 refills | Status: DC | PRN

## 2023-11-17 NOTE — Progress Notes (Unsigned)
   Acute Office Visit  Subjective:     Patient ID: Susan Haney, female    DOB: [birth year], 89 y.o.   MRN: [id number]  Chief Complaint  Patient presents with  . Back Pain    Back Pain This is a new problem. The current episode started yesterday. The problem occurs constantly. The problem is unchanged. The pain is present in the lumbar spine and gluteal. The quality of the pain is described as stabbing, aching and burning. The pain radiates to the left thigh and left knee (left lower calf). The symptoms are aggravated by sitting, twisting and bending. Associated symptoms include leg pain and tingling (left knee). Pertinent negatives include no bladder incontinence, bowel incontinence, dysuria, pelvic pain, perianal numbness or weakness. She has tried bed rest, ice and analgesics for the symptoms. The treatment provided no relief.   Pain started after trying to lift a hand truck with bag of soil up a few steps. Recently had injection in left hip for bursitis without relief. Hx of arthritis in hip and DDD in lower back. No fall.   Home BP has been 110-120/50-60s since starting half dose of losartan . She has had bilateral peripheral edema in both lower legs. This has been a fluctuating issue for her. She isn't able to get on compression socks right now due to back/hip pain. Previously was on hydrochlorothiazide  but this was d/c due hypotension. She does have intermittent palpitations with jitteriness. Gets anxious about this. Wore zio recently that did show multiple short runs of SVT that were asymptomatic.   Review of Systems  Gastrointestinal:  Negative for bowel incontinence.  Genitourinary:  Negative for bladder incontinence, dysuria and pelvic pain.  Musculoskeletal:  Positive for back pain.  Neurological:  Positive for tingling (left knee). Negative for weakness.        Objective:    BP (!) 133/59   Pulse 66   Temp 98.3 F (36.8 C) (Temporal)   Ht 5\' 3"  (1.6 m)   Wt 164 lb 12.8  oz (74.8 kg)   SpO2 98%   BMI 29.19 kg/m  {Vitals History (Optional):23777}  Physical Exam Vitals and nursing note reviewed.  Constitutional:      General: She is not in acute distress.    Appearance: She is not ill-appearing, toxic-appearing or diaphoretic.  Cardiovascular:     Rate and Rhythm: Normal rate and regular rhythm.     Heart sounds: Normal heart sounds. No murmur heard. Musculoskeletal:     Right lower leg: 1+ Pitting Edema present.     Left lower leg: 1+ Pitting Edema present.  Skin:    General: Skin is warm and dry.  Neurological:     Mental Status: She is alert and oriented to person, place, and time. Mental status is at baseline.     Gait: Gait abnormal (using cane, antalgic).  Psychiatric:        Mood and Affect: Mood normal.        Behavior: Behavior normal.    No results found for any visits on 11/17/23.      Assessment & Plan:   Problem List Items Addressed This Visit   None   No orders of the defined types were placed in this encounter.   No follow-ups on file.  Albertha Huger, FNP

## 2023-11-21 ENCOUNTER — Encounter: Payer: Self-pay | Admitting: Family Medicine

## 2023-11-24 DIAGNOSIS — B351 Tinea unguium: Secondary | ICD-10-CM | POA: Diagnosis not present

## 2023-11-24 DIAGNOSIS — E1142 Type 2 diabetes mellitus with diabetic polyneuropathy: Secondary | ICD-10-CM | POA: Diagnosis not present

## 2023-11-24 DIAGNOSIS — M79675 Pain in left toe(s): Secondary | ICD-10-CM | POA: Diagnosis not present

## 2023-11-24 DIAGNOSIS — L84 Corns and callosities: Secondary | ICD-10-CM | POA: Diagnosis not present

## 2023-11-24 DIAGNOSIS — M79674 Pain in right toe(s): Secondary | ICD-10-CM | POA: Diagnosis not present

## 2023-12-02 ENCOUNTER — Other Ambulatory Visit: Payer: Self-pay | Admitting: Family Medicine

## 2023-12-02 DIAGNOSIS — N1832 Chronic kidney disease, stage 3b: Secondary | ICD-10-CM

## 2023-12-02 MED ORDER — DAPAGLIFLOZIN PROPANEDIOL 10 MG PO TABS: 10.0000 mg | ORAL_TABLET | Freq: Every day | ORAL | 0 refills | Status: DC

## 2023-12-02 NOTE — Telephone Encounter (Signed)
 Copied from CRM [id number]. Topic: Clinical - Medication Refill >> Dec 02, 2023 11:58 AM Rosamond Comes wrote: Medication: dapagliflozin  propanediol (FARXIGA ) 10 MG TABS tablet   Has the patient contacted their pharmacy? Yes (Agent: If no, request that the patient contact the pharmacy for the refill. If patient does not wish to contact the pharmacy document the reason why and proceed with request.) (Agent: If yes, when and what did the pharmacy advise?)  This is the patient's preferred pharmacy:  [id number] - [place], [place] - [address] [place] [address] Phone: [phone] Fax: [phone]   Is this the correct pharmacy for this prescription? Yes If no, delete pharmacy and type the correct one.   Has the prescription been filled recently? Yes  Is the patient out of the medication? No  Has the patient been seen for an appointment in the last year OR does the patient have an upcoming appointment? Yes  Can we respond through MyChart? No  Agent: Please be advised that Rx refills may take up to 3 business days. We ask that you follow-up with your pharmacy.

## 2023-12-07 ENCOUNTER — Ambulatory Visit: Admitting: Family Medicine

## 2023-12-07 VITALS — BP 124/76 | HR 75 | Temp 98.3°F | Ht 63.0 in | Wt 161.8 lb

## 2023-12-07 DIAGNOSIS — N1832 Chronic kidney disease, stage 3b: Secondary | ICD-10-CM

## 2023-12-07 DIAGNOSIS — I10 Essential (primary) hypertension: Secondary | ICD-10-CM | POA: Diagnosis not present

## 2023-12-07 DIAGNOSIS — L989 Disorder of the skin and subcutaneous tissue, unspecified: Secondary | ICD-10-CM

## 2023-12-07 DIAGNOSIS — R600 Localized edema: Secondary | ICD-10-CM | POA: Diagnosis not present

## 2023-12-07 LAB — HEMOGLOBIN, FINGERSTICK: Hemoglobin: 10.5 g/dL — ABNORMAL LOW (ref 11.1–15.9)

## 2023-12-07 LAB — BMP8+EGFR
BUN/Creatinine Ratio: 16 (ref 12–28)
BUN: 17 mg/dL (ref 10–36)
CO2: 22 mmol/L (ref 20–29)
Calcium: 9.3 mg/dL (ref 8.7–10.3)
Chloride: 106 mmol/L (ref 96–106)
Creatinine, Ser: 1.04 mg/dL — ABNORMAL HIGH (ref 0.57–1.00)
Glucose: 102 mg/dL — ABNORMAL HIGH (ref 70–99)
Potassium: 4.5 mmol/L (ref 3.5–5.2)
Sodium: 143 mmol/L (ref 134–144)
eGFR: 51 mL/min/{1.73_m2} — ABNORMAL LOW (ref >59)

## 2023-12-07 MED ORDER — FUROSEMIDE 20 MG PO TABS: 20.0000 mg | ORAL_TABLET | Freq: Every day | ORAL | 3 refills | Status: DC | PRN

## 2023-12-07 NOTE — Patient Instructions (Signed)
 Peripheral Edema  Peripheral edema is swelling that is caused by a buildup of fluid. Peripheral edema most often affects the lower legs, ankles, and feet. It can also develop in the arms, hands, and face. The area of the body that has peripheral edema will look swollen. It may also feel heavy or warm. Your clothes may start to feel tight. Pressing on the area may make a temporary dent in your skin (pitting edema). You may not be able to move your swollen arm or leg as much as usual. There are many causes of peripheral edema. It can happen because of a complication of other conditions such as heart failure, kidney disease, or a problem with your circulation. It also can be a side effect of certain medicines or happen because of an infection. It often happens to women during pregnancy. Sometimes, the cause is not known. Follow these instructions at home: Managing pain, stiffness, and swelling  Raise (elevate) your legs while you are sitting or lying down. Move around often to prevent stiffness and to reduce swelling. Do not sit or stand for long periods of time. Do not wear tight clothing. Do not wear garters on your upper legs. Exercise your legs to get your circulation going. This helps to move the fluid back into your blood vessels, and it may help the swelling go down. Wear compression stockings as told by your health care provider. These stockings help to prevent blood clots and reduce swelling in your legs. It is important that these are the correct size. These stockings should be prescribed by your doctor to prevent possible injuries. If elastic bandages or wraps are recommended, use them as told by your health care provider. Medicines Take over-the-counter and prescription medicines only as told by your health care provider. Your health care provider may prescribe medicine to help your body get rid of excess water (diuretic). Take this medicine if you are told to take it. General  instructions Eat a low-salt (low-sodium) diet as told by your health care provider. Sometimes, eating less salt may reduce swelling. Pay attention to any changes in your symptoms. Moisturize your skin daily to help prevent skin from cracking and draining. Keep all follow-up visits. This is important. Contact a health care provider if: You have a fever. You have swelling in only one leg. You have increased swelling, redness, or pain in one or both of your legs. You have drainage or sores at the area where you have edema. Get help right away if: You have edema that starts suddenly or is getting worse, especially if you are pregnant or have a medical condition. You develop shortness of breath, especially when you are lying down. You have pain in your chest or abdomen. You feel weak. You feel like you will faint. These symptoms may be an emergency. Get help right away. Call 911. Do not wait to see if the symptoms will go away. Do not drive yourself to the hospital. Summary Peripheral edema is swelling that is caused by a buildup of fluid. Peripheral edema most often affects the lower legs, ankles, and feet. Move around often to prevent stiffness and to reduce swelling. Do not sit or stand for long periods of time. Pay attention to any changes in your symptoms. Contact a health care provider if you have edema that starts suddenly or is getting worse, especially if you are pregnant or have a medical condition. Get help right away if you develop shortness of breath, especially when lying down.  This information is not intended to replace advice given to you by your health care provider. Make sure you discuss any questions you have with your health care provider. Document Revised: 03/09/2021 Document Reviewed: 03/09/2021 Elsevier Patient Education  2024 [employer].

## 2023-12-07 NOTE — Progress Notes (Signed)
   Acute Office Visit  Subjective:     Patient ID: Susan Haney, female    DOB: Oct 12, 1931, 89 y.o.   MRN: [id number]  Chief Complaint  Patient presents with   Edema    HPI Patient is in today for follow up of edema. She has been taking lasix  daily with some improvement. She does not currently have on compression socks but wears these some days. Denies orthopnea, shortness of breath, chest pain.   She does have a lesion the right side of her chest that has been present for some time. It will scab over, flake off, then scab over again. She would like a referral to dermatology.    ROS As per HPI.     Objective:    BP 124/76   Pulse 75   Temp 98.3 F (36.8 C) (Temporal)   Ht 5\' 3"  (1.6 m)   Wt 161 lb 12.8 oz (73.4 kg)   SpO2 97%   BMI 28.66 kg/m    Physical Exam Vitals and nursing note reviewed.  Constitutional:      General: She is not in acute distress.    Appearance: She is not ill-appearing, toxic-appearing or diaphoretic.  Cardiovascular:     Rate and Rhythm: Normal rate and regular rhythm.     Heart sounds: Normal heart sounds. No murmur heard. Pulmonary:     Effort: Pulmonary effort is normal. No respiratory distress.     Breath sounds: Normal breath sounds. No wheezing, rhonchi or rales.  Musculoskeletal:     Right lower leg: 1+ Edema present.     Left lower leg: 1+ Edema present.  Skin:    Findings: Lesion (right anterior chest. See picture below.) present.  Neurological:     Mental Status: She is alert and oriented to person, place, and time. Mental status is at baseline.     Gait: Gait abnormal (using cane).  Psychiatric:        Mood and Affect: Mood normal.        Behavior: Behavior normal.      No results found for any visits on 12/07/23.      Assessment & Plan:   Silver was seen today for edema.  Diagnoses and all orders for this visit:  Peripheral edema Stable. No signs of cellulitis. Discussed compliance with compression socks,  elevate, low salt. Continue lasix  daily. Will recheck BMP since she has been taking this daily.  -     BMP8+EGFR -     furosemide  (LASIX ) 20 MG tablet; Take 1 tablet (20 mg total) by mouth daily as needed (leg swelling).  Primary hypertension Well controlled on current regimen.  -     BMP8+EGFR  Stage 3b chronic kidney disease (HCC) Labs pending.  -     BMP8+EGFR -     Hemoglobin, fingerstick  Skin lesion Referral to dermatology discussed and placed.   Return if symptoms worsen or fail to improve.  The patient indicates understanding of these issues and agrees with the plan.  Albertha Huger, FNP

## 2023-12-08 ENCOUNTER — Ambulatory Visit: Payer: Self-pay | Admitting: Family Medicine

## 2023-12-08 ENCOUNTER — Encounter: Payer: Self-pay | Admitting: Family Medicine

## 2023-12-27 ENCOUNTER — Other Ambulatory Visit: Payer: Self-pay | Admitting: Family Medicine

## 2023-12-27 ENCOUNTER — Other Ambulatory Visit: Payer: Self-pay | Admitting: Nurse Practitioner

## 2023-12-27 DIAGNOSIS — N1832 Chronic kidney disease, stage 3b: Secondary | ICD-10-CM

## 2023-12-27 DIAGNOSIS — R0981 Nasal congestion: Secondary | ICD-10-CM

## 2024-01-10 ENCOUNTER — Other Ambulatory Visit: Payer: Self-pay | Admitting: Family Medicine

## 2024-01-10 DIAGNOSIS — I10 Essential (primary) hypertension: Secondary | ICD-10-CM

## 2024-01-10 DIAGNOSIS — R600 Localized edema: Secondary | ICD-10-CM

## 2024-01-10 DIAGNOSIS — N1832 Chronic kidney disease, stage 3b: Secondary | ICD-10-CM

## 2024-01-12 ENCOUNTER — Encounter (INDEPENDENT_AMBULATORY_CARE_PROVIDER_SITE_OTHER): Payer: Self-pay

## 2024-01-12 ENCOUNTER — Ambulatory Visit (INDEPENDENT_AMBULATORY_CARE_PROVIDER_SITE_OTHER): Admitting: Gastroenterology

## 2024-01-30 ENCOUNTER — Ambulatory Visit: Payer: Self-pay

## 2024-01-30 NOTE — Telephone Encounter (Signed)
 FYI Only or Action Required?: FYI only for provider.  Patient was last seen in primary care on 12/07/2023 by Joesph Annabella HERO, FNP.  Called Nurse Triage reporting Tingling.  Symptoms began several years ago.  Interventions attempted: Nothing.  Symptoms are: gradually worsening.  Triage Disposition: See PCP When Office is Open (Within 3 Days)  Patient/caregiver understands and will follow disposition?: Yes, will follow disposition  Pt states that she has been having leg swelling, has talked to PCP about this. Pt states that she was advised to get compression stockings. Pt was told that she needs an rx for the stockings.  Pt states that she is having BUE numbness and tingling. Pt states that she has had carpal tunnel surgery and feels similar to the past carpal tunnel. Pt states that she is having HA for about 2 years, worsening, and has been occurring when she lays down, states the HA is dull, not sharp.   Copied from CRM [id number]. Topic: Clinical - Red Word Triage >> Jan 30, 2024  3:27 PM Tobias L wrote: Red Word that prompted transfer to Nurse Triage: legs swelling, tingling in hands, headache Reason for Disposition  [1] Weakness of arm / hand, or leg / foot AND [2] is a chronic symptom (recurrent or ongoing AND present > 4 weeks)  Answer Assessment - Initial Assessment Questions 1. SYMPTOM: What is the main symptom you are concerned about? (e.g., weakness, numbness)     BUE tingling, numbness, similar to her carpal tunnel in the past 2. ONSET: When did this start? (e.g., minutes, hours, days; while sleeping)     2019 4. PATTERN Does this come and go, or has it been constant since it started?  Is it present now?     Constant, worse at noc 5. CARDIAC SYMPTOMS: Have you had any of the following symptoms: chest pain, difficulty breathing, palpitations?     denies 6. NEUROLOGIC SYMPTOMS: Have you had any of the following symptoms: headache, dizziness, vision loss, double  vision, changes in speech, unsteady on your feet?     denies 7. OTHER SYMPTOMS: Do you have any other symptoms?    denies  Protocols used: Neurologic Deficit-A-AH

## 2024-01-30 NOTE — Telephone Encounter (Signed)
 E2C2 scheduled appointment.

## 2024-01-31 ENCOUNTER — Ambulatory Visit: Admitting: Nurse Practitioner

## 2024-01-31 ENCOUNTER — Encounter: Payer: Self-pay | Admitting: Nurse Practitioner

## 2024-01-31 VITALS — BP 143/67 | HR 70 | Temp 98.1°F | Ht 63.0 in | Wt 162.0 lb

## 2024-01-31 DIAGNOSIS — R600 Localized edema: Secondary | ICD-10-CM

## 2024-01-31 DIAGNOSIS — L03116 Cellulitis of left lower limb: Secondary | ICD-10-CM | POA: Diagnosis not present

## 2024-01-31 DIAGNOSIS — G5602 Carpal tunnel syndrome, left upper limb: Secondary | ICD-10-CM

## 2024-01-31 MED ORDER — FUROSEMIDE 40 MG PO TABS: 40.0000 mg | ORAL_TABLET | Freq: Every day | ORAL | 3 refills | Status: DC

## 2024-01-31 MED ORDER — V-2 HIGH COMPRESSION HOSE MISC: 2.0000 | Freq: Every day | 0 refills | Status: AC

## 2024-01-31 MED ORDER — SULFAMETHOXAZOLE-TRIMETHOPRIM 800-160 MG PO TABS: 1.0000 | ORAL_TABLET | Freq: Two times a day (BID) | ORAL | 0 refills | Status: DC

## 2024-01-31 NOTE — Patient Instructions (Signed)
 Preventing Problems Caused by Pinched Nerve in the Wrist (Carpal Tunnel Syndrome)  A pinched nerve in the wrist (carpal tunnel syndrome, or CTS) is a nerve problem that causes pain, numbness, and weakness in the wrist, hand, and fingers. The carpal tunnel is a narrow, rigid space in the wrist. Tendons and the median nerve pass through this tunnel. The median nerve gives sensation or feeling to the thumb, the muscles at the base of the thumb, and the first three fingers. CTS happens when the median nerve gets squeezed in this tunnel. In some cases, it may not be possible to prevent CTS. But you can take steps to relieve pressure on your wrist and reduce the risk of getting CTS. How can CTS affect me? CTS can make it hard to use your hands, wrists, and fingers do jobs or activities. It can cause symptoms such as: Pain in the wrist, hand, and fingers. Burning, tingling, or numbness in the affected area. A weak feeling in your hands. You may have trouble grabbing and holding items. Symptoms may get worse over time. For some people, symptoms get worse at night. What can increase my risk? The following factors may make you more likely to get CTS: Having a job where you often bend or move your wrist a lot, or use tools that vibrate. This can include jobs like using computers, working on an Theatre stage manager, or using power tools like Radiographer, therapeutic. Being a woman. Having a family history of CTS. Having certain conditions, such as: Diabetes. Pregnancy. Obesity. Thyroid disease. Rheumatoid arthritis. What actions can I take to help prevent CTS? Change how you work     If you use your hands and wrists for many hours at work, make changes to your work space to ease pressure on your wrists. You may want to use: A padded wrist rest for computer work. Use this to lightly rest your wrist and hands when you are not actively keying. A keyboard at a height in which your wrists are straight when typing. You  may need to flatten the keyboard or even tilt it away from you. Hand tools with padded handles or work gloves with padding to reduce vibrations. When using a computer keyboard or mouse, keep your wrists straight. Do not bend them down or to the side. Keep your arms and shoulders relaxed, and your elbows close to your body. Make other changes   Try not to make the same hand and wrist movements over and over. Avoid doing anything that makes your wrist bend, get stiff, or painful. Take breaks often if you use your hands and wrists for many hours at a time. Take breaks every 30 minutes or so. Avoid sitting for too long. Try to get up and move every 30 minutes. Stretch your hands and fingers often to increase blood flow and relieve tension. Talk to your health care provider about wearing a wrist brace or support. This will not prevent CTS but it may keep it from getting worse. A wrist brace may help reduce bending and stress. Manage any medical conditions that can put you at risk for CTS. Have your blood sugar checked to make sure you do not have diabetes. If you have diabetes, work with your provider to keep your blood sugar under control. Do nerve gliding or flossing exercises. These help keep nerves moving smoothly through the tissues around them. You'll be taught how to do these. Follow instructions from your provider. Contact a health care provider if:  You have numbness or tingling in your wrist, hand, or fingers. You have pain or a burning sensation in your wrist, hand, or fingers. Pain, tingling, or burning wakes you up at night. Your hand becomes weak and clumsy. You often drop objects. You feel pain every time you use your wrists and hands. This information is not intended to replace advice given to you by your health care provider. Make sure you discuss any questions you have with your health care provider. Document Revised: 03/04/2023 Document Reviewed: 03/04/2023 Elsevier Patient Education   2024 [employer].

## 2024-01-31 NOTE — Progress Notes (Signed)
 Subjective:    Patient ID: Susan Haney, female    DOB: June 15, 1932, 89 y.o.   MRN: [id number]   Chief Complaint: bilateral legs swelling (? Carpal tunnel left arm/wrist)   HPI  Patient come sin with 2 complaints: - bilateral  leg swelling- started several months ago and has gradually worsened.  - bil carpal- she has had carpal tunnel for years- had surgery on right hand. Still having issues. The left hand goes dead at night and aches. 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       Objective:   Physical Exam Constitutional:      Appearance: Normal appearance.  Cardiovascular:     Rate and Rhythm: Normal rate and regular rhythm.     Pulses:          Dorsalis pedis pulses are 2+ on the right side and 2+ on the left side.     Heart sounds: Normal heart sounds.  Pulmonary:     Effort: Pulmonary effort is normal.     Breath sounds: Normal breath sounds.  Musculoskeletal:     Right lower leg: Edema (1+) present.     Left lower leg: Edema (2+) present.     Comments: Positive phalen left greater the right  Skin:    Findings: Erythema (left lower ext) present.  Neurological:     Mental Status: She is alert.     BP  (!) 143/67   Pulse 70   Temp 98.1 F (36.7 C) (Temporal)   Ht 5' 3 (1.6 m)   Wt 162 lb (73.5 kg)   SpO2 98%   BMI 28.70 kg/m        Assessment & Plan:   Susan Haney in today with chief complaint of bilateral legs swelling (? Carpal tunnel left arm/wrist)   1. Peripheral edema Elevate legs when sitting Wear compression hose during the day - furosemide  (LASIX ) 40 MG tablet; Take 1 tablet (40 mg total) by mouth daily.  Dispense: 30 tablet; Refill: 3 - Elastic Bandages & Supports (V-2 HIGH COMPRESSION HOSE) MISC; 2 each by Does not apply route daily.  Dispense: 2 each; Refill: 0  2. Cellulitis of left lower extremity (Primary) Elevate when sitting - sulfamethoxazole -trimethoprim  (BACTRIM  DS) 800-160 MG tablet; Take 1 tablet by mouth 2 (two) times daily.  Dispense: 20 tablet; Refill: 0  3. Carpal tunnel syndrome on left Wear brace at night    The above assessment and management plan was discussed with the patient. The patient verbalized understanding of and has agreed to the management plan. Patient is aware to call the clinic if symptoms persist or worsen. Patient is aware when to return to the clinic for a follow-up visit. Patient educated on when it  is appropriate to go to the emergency department.   Mary-Margaret Gladis, FNP

## 2024-02-13 ENCOUNTER — Encounter: Payer: Self-pay | Admitting: Family Medicine

## 2024-02-13 ENCOUNTER — Ambulatory Visit (INDEPENDENT_AMBULATORY_CARE_PROVIDER_SITE_OTHER): Admitting: Family Medicine

## 2024-02-13 VITALS — BP 135/77 | HR 72 | Temp 98.3°F | Ht 63.0 in | Wt 162.0 lb

## 2024-02-13 DIAGNOSIS — R7303 Prediabetes: Secondary | ICD-10-CM | POA: Diagnosis not present

## 2024-02-13 DIAGNOSIS — G5602 Carpal tunnel syndrome, left upper limb: Secondary | ICD-10-CM | POA: Diagnosis not present

## 2024-02-13 DIAGNOSIS — I10 Essential (primary) hypertension: Secondary | ICD-10-CM | POA: Diagnosis not present

## 2024-02-13 DIAGNOSIS — K219 Gastro-esophageal reflux disease without esophagitis: Secondary | ICD-10-CM

## 2024-02-13 DIAGNOSIS — N1832 Chronic kidney disease, stage 3b: Secondary | ICD-10-CM

## 2024-02-13 LAB — BAYER DCA HB A1C WAIVED: HB A1C (BAYER DCA - WAIVED): 5.3 % (ref 4.8–5.6)

## 2024-02-13 NOTE — Progress Notes (Signed)
 Established Patient Office Visit  Subjective   Patient ID: Susan Haney, female    DOB: [birth year]  Age: 89 y.o. MRN: [id number]  Chief Complaint  Patient presents with   Medical Management of Chronic Issues    HPI HTN Complaint with meds - Yes Current Medications - losartan . Lasix   Checking BP at home: 130s/70s Pertinent ROS:  Fatigue - yes, since being ill last week Visual Disturbances - No Chest pain - No Dyspnea - No Palpitations - No LE edema - baseline. Swelling resolves overnight.  2. Prediabetes Susan Haney is on farxiga  for CKD. Fasting blood sugars have been <110.  3. Wrist pain Hx of carpal tunnel of left. Was supposed to have surgery but did not go through with this as it was not of benefit on her right wrist. Susan Haney had company over and did more activity in the kitchen than Susan Haney usually would. Had had increased wrist pain since. Has numbness in pointer through ring finger. Symptoms worse at night. Susan Haney has been wearing a brace with some relief.      02/13/2024   10:17 AM 01/31/2024   10:27 AM 12/07/2023   10:05 AM  Depression screen PHQ 2/9  Decreased Interest 0 0 0  Down, Depressed, Hopeless 0 0 0  PHQ - 2 Score 0 0 0  Altered sleeping 3  3  Tired, decreased energy 1  0  Change in appetite 0  0  Feeling bad or failure about yourself  0  0  Trouble concentrating 0  0  Moving slowly or fidgety/restless 0  0  Suicidal thoughts 0  0  PHQ-9 Score 4  3  Difficult doing work/chores Somewhat difficult  Not difficult at all      02/13/2024   10:17 AM 12/07/2023   10:06 AM 11/17/2023    8:13 AM 11/14/2023    7:55 AM  GAD 7 : Generalized Anxiety Score  Nervous, Anxious, on Edge 1 0 0 0  Control/stop worrying 0 0 0 0  Worry too much - different things 0 0 1 0  Trouble relaxing 1 0 1 0  Restless 0 0 0 0  Easily annoyed or irritable 0 0 0 0  Afraid - awful might happen 0 0 0 0  Total GAD 7 Score 2 0 2 0  Anxiety Difficulty Somewhat difficult Not difficult at all  Somewhat difficult Not difficult at all     Past Medical History:  Diagnosis Date   Allergy    Anemia    history ,  took iron for years   Arthritis    Bicuspid aortic valve    Cancer (HCC) 2017   endometrial cancer / cervical   Cataract    Cellulitis 12/31/2017   BLE, ankles   Chronic back pain    Chronic kidney disease    DDD (degenerative disc disease), lumbar    Diabetes mellitus without complication (HCC)    Endometrial cancer (HCC)    GERD (gastroesophageal reflux disease)    History of radiation therapy 06/15/16-07/06/16   vaginal cuff 30 Gy in 5 fractions   Hypertension    Neuromuscular disorder (HCC)    Prediabetes    Spondylosis    lumbar      ROS As per HPI.    Objective:     BP 135/77   Pulse 72   Temp 98.3 F (36.8 C) (Temporal)   Ht 5' 3 (1.6 m)   Wt 162 lb (73.5 kg)   SpO2 98%  BMI 28.70 kg/m  BP Readings from Last 3 Encounters:  02/13/24 135/77  01/31/24 (!) 143/67  12/07/23 124/76   BP Readings from Last 3 Encounters:  02/13/24 135/77  01/31/24 (!) 143/67  12/07/23 124/76     Physical Exam Vitals and nursing note reviewed.  Constitutional:      General: Susan Haney is not in acute distress.    Appearance: Susan Haney is not ill-appearing, toxic-appearing or diaphoretic.  HENT:     Head: Normocephalic and atraumatic.     Mouth/Throat:     Mouth: Mucous membranes are moist. No angioedema.     Pharynx: Oropharynx is clear.  Cardiovascular:     Rate and Rhythm: Normal rate and regular rhythm.     Heart sounds: Normal heart sounds. No murmur heard. Pulmonary:     Effort: Pulmonary effort is normal. No respiratory distress.     Breath sounds: Normal breath sounds.  Abdominal:     General: Bowel sounds are normal. There is no distension.     Palpations: Abdomen is soft.     Tenderness: There is no abdominal tenderness. There is no guarding or rebound.  Musculoskeletal:     Cervical back: No rigidity.     Right lower leg: No edema.     Left  lower leg: No edema.  Skin:    General: Skin is warm and dry.  Neurological:     General: No focal deficit present.     Mental Status: Susan Haney is alert and oriented to person, place, and time.  Psychiatric:        Mood and Affect: Mood normal.        Behavior: Behavior normal.    No results found for any visits on 02/13/24.    The ASCVD Risk score (Arnett DK, et al., 2019) failed to calculate for the following reasons:   The 2019 ASCVD risk score is only valid for ages 58 to 56    Assessment & Plan:   Susan Haney was seen today for medical management of chronic issues.  Diagnoses and all orders for this visit:  Prediabetes A1c is well controlled.  -     Bayer DCA Hb A1c Waived  Stage 3b chronic kidney disease (HCC) On ARB and farxiga .  -     CMP14+EGFR  Primary hypertension BP at goal.   Carpal tunnel syndrome on left Continue bracing. Discussed referral to ortho if symptoms persist or worsen.   Return in about 6 months (around 08/15/2024) for chronic follow up.   The patient indicates understanding of these issues and agrees with the plan.  Susan CHRISTELLA Search, FNP

## 2024-02-14 LAB — CMP14+EGFR
ALT: 12 IU/L (ref 0–32)
AST: 15 IU/L (ref 0–40)
Albumin: 3.9 g/dL (ref 3.6–4.6)
Alkaline Phosphatase: 48 IU/L (ref 44–121)
BUN/Creatinine Ratio: 19 (ref 12–28)
BUN: 26 mg/dL (ref 10–36)
Bilirubin Total: <0.2 mg/dL (ref 0.0–1.2)
CO2: 21 mmol/L (ref 20–29)
Calcium: 9 mg/dL (ref 8.7–10.3)
Chloride: 104 mmol/L (ref 96–106)
Creatinine, Ser: 1.38 mg/dL — AB (ref 0.57–1.00)
Globulin, Total: 2 g/dL (ref 1.5–4.5)
Glucose: 90 mg/dL (ref 70–99)
Potassium: 4.4 mmol/L (ref 3.5–5.2)
Sodium: 140 mmol/L (ref 134–144)
Total Protein: 5.9 g/dL — AB (ref 6.0–8.5)
eGFR: 36 mL/min/1.73 — AB (ref >59)

## 2024-02-15 ENCOUNTER — Ambulatory Visit: Payer: Self-pay | Admitting: Family Medicine

## 2024-02-15 DIAGNOSIS — N1832 Chronic kidney disease, stage 3b: Secondary | ICD-10-CM

## 2024-02-20 ENCOUNTER — Other Ambulatory Visit

## 2024-02-20 DIAGNOSIS — N1832 Chronic kidney disease, stage 3b: Secondary | ICD-10-CM

## 2024-02-21 LAB — BASIC METABOLIC PANEL WITH GFR
BUN/Creatinine Ratio: 22 (ref 12–28)
BUN: 27 mg/dL (ref 10–36)
CO2: 21 mmol/L (ref 20–29)
Calcium: 9.2 mg/dL (ref 8.7–10.3)
Chloride: 100 mmol/L (ref 96–106)
Creatinine, Ser: 1.21 mg/dL — ABNORMAL HIGH (ref 0.57–1.00)
Glucose: 97 mg/dL (ref 70–99)
Potassium: 4 mmol/L (ref 3.5–5.2)
Sodium: 139 mmol/L (ref 134–144)
eGFR: 43 mL/min/1.73 — ABNORMAL LOW (ref >59)

## 2024-02-22 ENCOUNTER — Ambulatory Visit: Payer: Self-pay | Admitting: Family Medicine

## 2024-02-23 DIAGNOSIS — M79675 Pain in left toe(s): Secondary | ICD-10-CM | POA: Diagnosis not present

## 2024-02-23 DIAGNOSIS — L84 Corns and callosities: Secondary | ICD-10-CM | POA: Diagnosis not present

## 2024-02-23 DIAGNOSIS — B351 Tinea unguium: Secondary | ICD-10-CM | POA: Diagnosis not present

## 2024-02-23 DIAGNOSIS — M79674 Pain in right toe(s): Secondary | ICD-10-CM | POA: Diagnosis not present

## 2024-02-23 DIAGNOSIS — E1142 Type 2 diabetes mellitus with diabetic polyneuropathy: Secondary | ICD-10-CM | POA: Diagnosis not present

## 2024-02-28 ENCOUNTER — Encounter (INDEPENDENT_AMBULATORY_CARE_PROVIDER_SITE_OTHER): Payer: Self-pay | Admitting: Gastroenterology

## 2024-02-28 ENCOUNTER — Ambulatory Visit (INDEPENDENT_AMBULATORY_CARE_PROVIDER_SITE_OTHER): Admitting: Gastroenterology

## 2024-02-28 VITALS — BP 139/69 | HR 91 | Temp 97.8°F | Ht 63.0 in | Wt 163.1 lb

## 2024-02-28 DIAGNOSIS — D509 Iron deficiency anemia, unspecified: Secondary | ICD-10-CM

## 2024-02-28 DIAGNOSIS — K5900 Constipation, unspecified: Secondary | ICD-10-CM | POA: Diagnosis not present

## 2024-02-28 DIAGNOSIS — K5904 Chronic idiopathic constipation: Secondary | ICD-10-CM

## 2024-02-28 DIAGNOSIS — K219 Gastro-esophageal reflux disease without esophagitis: Secondary | ICD-10-CM

## 2024-02-28 NOTE — Patient Instructions (Signed)
 We will continue amitiza  8mcg twice daily Increase water  intake, aim for atleast 64 oz per day Increase fruits, veggies and whole grains, kiwi and prunes are especially good for constipation Continue to avoid spicy foods/trigger foods for reflux and use omeprazole  20mg  as needed  We will update iron studies and check your hemoglobin. Please continue to take you iron pill. As discussed, there are many reasons that your iron could be low and that we found blood in your stools, I have recommended EGD and Colonoscopy. If you decide you want to pursue these, please let me know  Follow up 3 months  It was a pleasure to see you today. I want to create trusting relationships with patients and provide genuine, compassionate, and quality care. I truly value your feedback! please be on the lookout for a survey regarding your visit with me today. I appreciate your input about our visit and your time in completing this!    Brie Eppard L. Nalayah Hitt, MSN, APRN, AGNP-C Adult-Gerontology Nurse Practitioner [facility] at [facility]

## 2024-02-28 NOTE — Progress Notes (Signed)
 Referring Provider: Joesph Annabella HERO, FNP Primary Care Physician:  Joesph Annabella HERO, FNP Primary GI Physician: Dr. Eartha   Chief Complaint  Patient presents with   Follow-up    Patient here today for a follow up on Gerd. Patient states the Thalia is under control, with omeprazole  prn.    HPI:   Susan Haney is a 89 y.o. female with past medical history of  anemia, arthritis, biscuspid aortic valve, DDD, GERD, HTN, prediabetes.    Patient presenting today for:  Follow up of GERD, IDA and constipation  Last seen march, at that time, having more gas, taking amitiza  8mcg BID. Can go up to 2 days without a BM, sometimes has a good BM followed by watery stools. History of IDA in 2017 with more recent acute IDA. Seen a swipe of blood on toilet tissue, denied melena. GERD well controlled with PRN use of omeprazole  20mg .   Patient recommended to continue daily Iron, Occult stool card x3, consider EGD/colonoscopy if stool cards positive, continue amitiza  8mcg BID, continue omeprazole  20mg  PRN  iron studies on 3/3 with TIBC 356, Iron 28, iron sat 8, ferritin 23, B12 728, folate 13.6.   Occult stool cards positive x3 in April 2025, patient was recommended to have EGD/colonoscopy though declined at that time and wanted to think about procedures   Last hgb on 5/21 was 10.5  Present: Constipation is doing better on amitiza  8mcg BID and metamucil daily. She states on occasion if she skips a day or two she will take both doses of amitiza  together which seems to help. Having a BM almost daily. No abdominal pain, rectal bleeding. Stools are dark on iron pill. Appetite is good, no weight loss. Np SOB, dizziness or fatigue.   GERD well controlled with diet, will take omeprazole  20mg  PRN which takes care of things. No dysphagia or odynophagia. She tries to avoid spicy/trigger foods.   Last Colonoscopy:04/04/14 Examination performed to cecum. Single left-sided diverticulum and external hemorrhoids  otherwise normal colonoscopy.    Filed Weights   02/28/24 0838  Weight: 163 lb 1.6 oz (74 kg)     Past Medical History:  Diagnosis Date   Allergy    Anemia    history ,  took iron for years   Arthritis    Bicuspid aortic valve    Cancer (HCC) 2017   endometrial cancer / cervical   Cataract    Cellulitis 12/31/2017   BLE, ankles   Chronic back pain    Chronic kidney disease    DDD (degenerative disc disease), lumbar    Diabetes mellitus without complication (HCC)    Endometrial cancer (HCC)    GERD (gastroesophageal reflux disease)    History of radiation therapy 06/15/16-07/06/16   vaginal cuff 30 Gy in 5 fractions   Hypertension    Neuromuscular disorder (HCC)    Prediabetes    Spondylosis    lumbar    Past Surgical History:  Procedure Laterality Date   ABDOMINAL HYSTERECTOMY     BUNIONECTOMY     CARPAL TUNNEL RELEASE Right 05/30/2018   Procedure: RIGHT CARPAL TUNNEL RELEASE;  Surgeon: Murrell Kuba, MD;  Location: [facility];  Service: Orthopedics;  Laterality: Right;   COLONOSCOPY N/A 04/04/2014   Procedure: COLONOSCOPY;  Surgeon: Claudis RAYMOND Rivet, MD;  Location: [facility];  Service: Endoscopy;  Laterality: N/A;  100   EYE SURGERY     bil cataracts and lens implants   FOOT SURGERY  Left   FRACTURE SURGERY     JOINT REPLACEMENT     Ovary removed: benign     REPLACEMENT TOTAL KNEE BILATERAL     2013   ROBOTIC ASSISTED TOTAL HYSTERECTOMY WITH BILATERAL SALPINGO OOPHERECTOMY Left 04/13/2016   Procedure: XI ROBOTIC ASSISTED TOTAL HYSTERECTOMY WITH LEFT SALPINGO OOPHORECTOMY SENTINEL LYMPH NODE BIOPSY;  Surgeon: Maurilio Ship, MD;  Location: [facility];  Service: Gynecology;  Laterality: Left;   TOTAL KNEE REVISION Left 02/12/2016   Procedure: LEFT TOTAL KNEE REVISION;  Surgeon: Redell Shoals, MD;  Location: [facility];  Service: Orthopedics;  Laterality: Left;    Current Outpatient Medications  Medication Sig Dispense Refill   ACCU-CHEK AVIVA PLUS  test strip USE TO CHECK BLOOD SUGARS 1-2 TIMES DAILY. DX E11.9 100 strip 12   aspirin EC 81 MG tablet Take 81 mg by mouth daily.     Biotin 1000 MCG tablet Take 1,000 mcg by mouth daily.     cetirizine  (ZYRTEC ) 10 MG tablet Take 1 tablet (10 mg total) by mouth daily. (Patient taking differently: Take 10 mg by mouth as needed.) 90 tablet 1   Cholecalciferol 50 MCG (2000 UT) CAPS Take 2,000 Units by mouth.     Cyanocobalamin  (B-12 PO) Take by mouth.     dapagliflozin  propanediol (FARXIGA ) 10 MG TABS tablet TAKE 1 TABLET BY MOUTH DAILY 90 tablet 0   Elastic Bandages & Supports (V-2 HIGH COMPRESSION HOSE) MISC 2 each by Does not apply route daily. 2 each 0   ferrous sulfate  324 MG TBEC Take 324 mg by mouth. (Patient taking differently: Take 324 mg by mouth daily.)     fluticasone  (FLONASE ) 50 MCG/ACT nasal spray SPRAY 2 SPRAYS INTO EACH NOSTRIL EVERY DAY (Patient taking differently: as needed.) 48 mL 2   furosemide  (LASIX ) 40 MG tablet Take 1 tablet (40 mg total) by mouth daily. 30 tablet 3   lidocaine  (LIDODERM ) 5 % lidocaine  5 % topical patch  PLACE 1 PATCH ONTO THE SKIN DAILY. REMOVE & DISCARD PATCH WITHIN 12 HOURS OR AS DIRECTED BY MD (Patient taking differently: as needed.)     losartan  (COZAAR ) 25 MG tablet Take 0.5 tablets (12.5 mg total) by mouth daily. 90 tablet 3   lubiprostone  (AMITIZA ) 8 MCG capsule Take 1 capsule (8 mcg total) by mouth 2 (two) times daily with a meal. 200 capsule 2   Magnesium  Oxide (MAG-200 PO) Take by mouth daily at 6 (six) AM. (Patient taking differently: Take by mouth as needed.)     montelukast  (SINGULAIR ) 10 MG tablet Take 1 tablet (10 mg total) by mouth at bedtime. 100 tablet 2   Multiple Vitamins-Minerals (MULTIVITAMIN WITH MINERALS) tablet Take 1 tablet by mouth daily.     Omega-3 Fatty Acids (MAXEPA PO) Take 500 mg by mouth daily at 6 (six) AM.     omeprazole  (PRILOSEC) 20 MG capsule TAKE 1 CAPSULE (20 MG TOTAL) BY MOUTH DAILY AS NEEDED. TAKES AS NEEDED 90  capsule 1   psyllium (METAMUCIL) 58.6 % packet Take 1 packet by mouth daily.     triamcinolone  cream (KENALOG ) 0.5 % APPLY TOPICALLY 3 TIMES A DAY (Patient taking differently: as needed.) 45 g 2   No current facility-administered medications for this visit.    Allergies as of 02/28/2024   (No Known Allergies)    Social History   Socioeconomic History   Marital status: Widowed    Spouse name: Not on file   Number of children: 2   Years of education:  Not on file   Highest education level: Some college, no degree  Occupational History   Occupation: retired  Tobacco Use   Smoking status: Never    Passive exposure: Past   Smokeless tobacco: Never  Vaping Use   Vaping status: Never Used  Substance and Sexual Activity   Alcohol use: No   Drug use: No   Sexual activity: Not Currently    Birth control/protection: None  Other Topics Concern   Not on file  Social History Narrative   2 sons - in [place] and [place]   Disabled sister lives close by   Social Drivers of [employer] Strain: Low Risk  (01/30/2024)   Overall Financial Resource Strain (CARDIA)    Difficulty of Paying Living Expenses: Not hard at all  Food Insecurity: No Food Insecurity (01/30/2024)   Hunger Vital Sign    Worried About Running Out of Food in the Last Year: Never true    Ran Out of Food in the Last Year: Never true  Transportation Needs: No Transportation Needs (01/30/2024)   PRAPARE - Administrator, Civil Service (Medical): No    Lack of Transportation (Non-Medical): No  Physical Activity: Sufficiently Active (01/30/2024)   Exercise Vital Sign    Days of Exercise per Week: 6 days    Minutes of Exercise per Session: 40 min  Stress: No Stress Concern Present (01/30/2024)   [employer] of Occupational Health - Occupational Stress Questionnaire    Feeling of Stress: Not at all  Social Connections: Moderately Integrated (01/30/2024)   Social Connection and Isolation  Panel    Frequency of Communication with Friends and Family: Three times a week    Frequency of Social Gatherings with Friends and Family: Once a week    Attends Religious Services: More than 4 times per year    Active Member of [employer] or Organizations: Yes    Attends Banker Meetings: More than 4 times per year    Marital Status: Widowed    Review of systems General: negative for malaise, night sweats, fever, chills, weight loss Neck: Negative for lumps, goiter, pain and significant neck swelling Resp: Negative for cough, wheezing, dyspnea at rest CV: Negative for chest pain, leg swelling, palpitations, orthopnea GI: denies melena, hematochezia, nausea, vomiting, diarrhea, constipation, dysphagia, odyonophagia, early satiety or unintentional weight loss.  MSK: Negative for joint pain or swelling, back pain, and muscle pain. Derm: Negative for itching or rash Psych: Denies depression, anxiety, memory loss, confusion. No homicidal or suicidal ideation.  Heme: Negative for prolonged bleeding, bruising easily, and swollen nodes. Endocrine: Negative for cold or heat intolerance, polyuria, polydipsia and goiter. Neuro: negative for tremor, gait imbalance, syncope and seizures. The remainder of the review of systems is noncontributory.  Physical Exam: BP 139/69 (BP Location: Left Arm, Patient Position: Sitting, Cuff Size: Normal)   Pulse 91   Temp 97.8 F (36.6 C) (Temporal)   Ht 5' 3 (1.6 m)   Wt 163 lb 1.6 oz (74 kg)   BMI 28.89 kg/m  General:   Alert and oriented. No distress noted. Pleasant and cooperative.  Head:  Normocephalic and atraumatic. Eyes:  Conjuctiva clear without scleral icterus. Mouth:  Oral mucosa pink and moist. Good dentition. No lesions. Heart: Normal rate and rhythm, s1 and s2 heart sounds present.  Lungs: Clear lung sounds in all lobes. Respirations equal and unlabored. Abdomen:  +BS, soft, non-tender and non-distended. No rebound or guarding. No  HSM or  masses noted. Derm: No palmar erythema or jaundice Msk:  Symmetrical without gross deformities. Normal posture. Extremities:  Without edema. Neurologic:  Alert and  oriented x4 Psych:  Alert and cooperative. Normal mood and affect.  Invalid input(s): 6 MONTHS   ASSESSMENT: Susan Haney is a 89 y.o. female presenting today for follow up of IDA, constipation and GERD  IDA: history of IDA off and on for years though more recently with low iron levels and hemoglobin in 10 range starting in february with hemoccult cards positive x3. She has had no rectal bleeding or melena, no weight changes, bowel habit changes, abdominal pain. I had a thorough discussion with the patient again regarding recommendations of EGD and Colonoscopy (to include risks vs benefits) as there are many causes of IDA/blood loss in the GI tract, most importantly to include possible presence of malignancy which we cannot exclude without bidirectional endoscopic evaluation. I again urged the patient to consider EGD/Colonoscopy. Will continue with PO iron and update labs to see how her iron levels and hemoglobin are looking.  Constipation: well managed on amitiza  8mcg BID and metamucil daily. Having a BM daily without issue. Will continue with current regimen.   GERD: mostly well controlled with diet, using omeprazole  20mg  PRN but overall trying to avoid trigger foods.    PLAN:  -continue PO Iron daily -h&h, Iron studies -pt to make me aware of overt GI bleeding  -pt to consider EGD/Colonoscopy -continue amitiza  8mcg BID -omeprazole  20mg  PRN -good reflux precautions -Increase water  intake, aim for atleast 64 oz per day -Increase fruits, veggies and whole grains, kiwi and prunes are especially good for constipation  All questions were answered, patient verbalized understanding and is in agreement with plan as outlined above.   Follow Up: 3 months   Susan Haney L. Mariette, MSN, APRN, AGNP-C Adult-Gerontology  Nurse Practitioner [facility]  I have reviewed the note and agree with the APP's assessment as described in this progress note  Toribio Fortune, MD Gastroenterology and Hepatology [facility]

## 2024-02-29 ENCOUNTER — Ambulatory Visit (INDEPENDENT_AMBULATORY_CARE_PROVIDER_SITE_OTHER): Payer: Self-pay | Admitting: Gastroenterology

## 2024-02-29 LAB — IRON,TIBC AND FERRITIN PANEL
%SAT: 17 % (ref 16–45)
Ferritin: 17 ng/mL (ref 16–288)
Iron: 57 ug/dL (ref 45–160)
TIBC: 331 ug/dL (ref 250–450)

## 2024-02-29 LAB — HEMOGLOBIN AND HEMATOCRIT, BLOOD
HCT: 32 % — ABNORMAL LOW (ref 35.0–45.0)
Hemoglobin: 10.1 g/dL — ABNORMAL LOW (ref 11.7–15.5)

## 2024-03-02 DIAGNOSIS — S63656A Sprain of metacarpophalangeal joint of right little finger, initial encounter: Secondary | ICD-10-CM | POA: Diagnosis not present

## 2024-03-02 DIAGNOSIS — M19031 Primary osteoarthritis, right wrist: Secondary | ICD-10-CM | POA: Diagnosis not present

## 2024-03-02 DIAGNOSIS — M65932 Unspecified synovitis and tenosynovitis, left forearm: Secondary | ICD-10-CM | POA: Diagnosis not present

## 2024-03-02 DIAGNOSIS — G5603 Carpal tunnel syndrome, bilateral upper limbs: Secondary | ICD-10-CM | POA: Diagnosis not present

## 2024-03-02 DIAGNOSIS — M19042 Primary osteoarthritis, left hand: Secondary | ICD-10-CM | POA: Diagnosis not present

## 2024-03-02 DIAGNOSIS — M19041 Primary osteoarthritis, right hand: Secondary | ICD-10-CM | POA: Diagnosis not present

## 2024-03-02 DIAGNOSIS — M19032 Primary osteoarthritis, left wrist: Secondary | ICD-10-CM | POA: Diagnosis not present

## 2024-03-24 ENCOUNTER — Other Ambulatory Visit: Payer: Self-pay | Admitting: Family Medicine

## 2024-03-24 DIAGNOSIS — N1832 Chronic kidney disease, stage 3b: Secondary | ICD-10-CM

## 2024-03-30 ENCOUNTER — Telehealth: Payer: Self-pay | Admitting: Family Medicine

## 2024-03-30 DIAGNOSIS — E119 Type 2 diabetes mellitus without complications: Secondary | ICD-10-CM

## 2024-03-30 NOTE — Telephone Encounter (Unsigned)
 Copied from CRM #[id number]. Topic: Clinical - Medication Refill >> Mar 30, 2024  2:33 PM Delon T wrote: Medication: ACCU-CHEK AVIVA PLUS test strip-  just need one bottle of 50  Has the patient contacted their pharmacy? No (Agent: If no, request that the patient contact the pharmacy for the refill. If patient does not wish to contact the pharmacy document the reason why and proceed with request.) (Agent: If yes, when and what did the pharmacy advise?)  This is the patient's preferred pharmacy:  [id number] - [place], [place] - [address] [place] [address] Phone: [phone] Fax: [phone]    Is this the correct pharmacy for this prescription? Yes If no, delete pharmacy and type the correct one.   Has the prescription been filled recently? Yes  Is the patient out of the medication? Yes  Has the patient been seen for an appointment in the last year OR does the patient have an upcoming appointment? Yes  Can we respond through MyChart? Yes  Agent: Please be advised that Rx refills may take up to 3 business days. We ask that you follow-up with your pharmacy.

## 2024-04-03 MED ORDER — ACCU-CHEK AVIVA PLUS VI STRP: ORAL_STRIP | 12 refills | Status: AC

## 2024-04-05 ENCOUNTER — Ambulatory Visit: Payer: Self-pay

## 2024-04-05 ENCOUNTER — Ambulatory Visit: Admitting: Family Medicine

## 2024-04-05 ENCOUNTER — Encounter: Payer: Self-pay | Admitting: Family Medicine

## 2024-04-05 VITALS — BP 132/76 | HR 84 | Temp 98.6°F | Ht 63.0 in | Wt 159.8 lb

## 2024-04-05 DIAGNOSIS — Z23 Encounter for immunization: Secondary | ICD-10-CM | POA: Diagnosis not present

## 2024-04-05 DIAGNOSIS — M25432 Effusion, left wrist: Secondary | ICD-10-CM

## 2024-04-05 DIAGNOSIS — M79642 Pain in left hand: Secondary | ICD-10-CM

## 2024-04-05 DIAGNOSIS — N1832 Chronic kidney disease, stage 3b: Secondary | ICD-10-CM

## 2024-04-05 DIAGNOSIS — M25531 Pain in right wrist: Secondary | ICD-10-CM | POA: Diagnosis not present

## 2024-04-05 DIAGNOSIS — M25532 Pain in left wrist: Secondary | ICD-10-CM

## 2024-04-05 DIAGNOSIS — M25431 Effusion, right wrist: Secondary | ICD-10-CM

## 2024-04-05 DIAGNOSIS — M79641 Pain in right hand: Secondary | ICD-10-CM | POA: Diagnosis not present

## 2024-04-05 DIAGNOSIS — J302 Other seasonal allergic rhinitis: Secondary | ICD-10-CM

## 2024-04-05 DIAGNOSIS — I10 Essential (primary) hypertension: Secondary | ICD-10-CM | POA: Diagnosis not present

## 2024-04-05 MED ORDER — MONTELUKAST SODIUM 10 MG PO TABS: 10.0000 mg | ORAL_TABLET | Freq: Every day | ORAL | 3 refills | Status: AC

## 2024-04-05 NOTE — Telephone Encounter (Signed)
 FYI Only or Action Required?: Action required by provider: request for appointment.  Patient was last seen in primary care on 02/13/2024 by Joesph Annabella HERO, FNP.  Called Nurse Triage reporting Hand Pain.  Symptoms began about a month ago.  Interventions attempted: Rest, hydration, or home remedies.  Symptoms are: gradually worsening. Arthritis pain getting worse, has swelling now as well.  Triage Disposition: No disposition on file.  Patient/caregiver understands and will follow disposition?:    Copied from CRM [id number]. Topic: Clinical - Red Word Triage >> Apr 05, 2024  9:26 AM Susan Haney wrote: Arthritis in both hands and they are swollen and says she can hardly do anything due to the pain Answer Assessment - Initial Assessment Questions 1. ONSET: When did the pain start?     1 month 2. LOCATION: Where is the pain located?     Both 3. PAIN: How bad is the pain? (Scale 1-10; or mild, moderate, severe)   - MILD (1-3): doesn't interfere with normal activities   - MODERATE (4-7): interferes with normal activities (e.g., work or school) or awakens from sleep   - SEVERE (8-10): excruciating pain, unable to use hand at all     10 4. WORK OR EXERCISE: Has there been any recent work or exercise that involved this part (i.e., hand or wrist) of the body?     no 5. CAUSE: What do you think is causing the pain?     arthritis 6. AGGRAVATING FACTORS: What makes the pain worse? (e.g., using computer)     no 7. OTHER SYMPTOMS: Do you have any other symptoms? (e.g., neck pain, swelling, rash, numbness, fever)     swelling 8. PREGNANCY: Is there any chance you are pregnant? When was your last menstrual period?     no  Protocols used: Hand and Wrist Pain-A-AH

## 2024-04-05 NOTE — Progress Notes (Signed)
 Acute Office Visit  Subjective:     Patient ID: Susan Haney, female    DOB: [birth year], 89 y.o.   MRN: [id number]  Chief Complaint  Patient presents with   Hand Pain   Wrist Pain    HPI  History of Present Illness   Susan Haney is a 89 year old female who presents with worsening bilateral wrist pain and swelling.  Bilateral wrist arthralgia and swelling - Bilateral wrist pain present for two months, progressively worsening - Pain and swelling increased after corticosteroid injections administered in both wrists one month ago - Left wrist is notably swollen today - Significant morning stiffness present that improves by midday - Pain is exacerbated by activity, particularly in the morning - Braces worn on both wrists at night and occasionally during the day - Current pain is more debilitating than previous arthritis, affecting ability to perform daily tasks such as opening jars and driving  Paresthesia and digital pain - Numbness and pain present in two fingers - Fingers described as numb yet painful  Therapeutic interventions and response - Tylenol  Precise rub applied to hands, providing some relief, especially in the mornings - Oral Tylenol  not taken today  Autoimmune and arthritic evaluation - No prior workup for autoimmune conditions - History of arthritis affecting nerves and bones, but current symptoms are different and more severe     She also needs a refill of singular today.   ROS As per HPI.      Objective:    BP 132/76   Pulse 84   Temp 98.6 F (37 C) (Temporal)   Ht 5' 3 (1.6 m)   Wt 159 lb 12.8 oz (72.5 kg)   SpO2 98%   BMI 28.31 kg/m    Physical Exam Vitals and nursing note reviewed.  Constitutional:      General: She is not in acute distress.    Appearance: She is not ill-appearing, toxic-appearing or diaphoretic.  Musculoskeletal:     Right wrist: Swelling and tenderness present.     Left wrist: Swelling and tenderness present.      Right hand: Tenderness present.     Left hand: Tenderness present.     Comments: Generalized tenderness and swelling to bilateral wrists and hands with warmth. No erythema, rash, or exudate  Skin:    General: Skin is warm and dry.  Neurological:     Mental Status: She is alert and oriented to person, place, and time. Mental status is at baseline.  Psychiatric:        Mood and Affect: Mood normal.        Behavior: Behavior normal.     No results found for any visits on 04/05/24.      Assessment & Plan:   Susan Haney was seen today for hand pain and wrist pain.  Diagnoses and all orders for this visit:  Bilateral wrist pain -     Sedimentation Rate -     C-reactive protein -     ANA,IFA RA Diag Pnl w/rflx Tit/Patn -     Rheumatoid factor  Bilateral hand pain -     Sedimentation Rate -     C-reactive protein -     ANA,IFA RA Diag Pnl w/rflx Tit/Patn -     Rheumatoid factor  Stage 3b chronic kidney disease (HCC)  Swelling of both wrists -     Sedimentation Rate -     C-reactive protein -     ANA,IFA RA Diag  Pnl w/rflx Tit/Patn -     Rheumatoid factor  Seasonal allergies -     montelukast  (SINGULAIR ) 10 MG tablet; Take 1 tablet (10 mg total) by mouth at bedtime.  Encounter for immunization -     Flu vaccine HIGH DOSE PF(Fluzone Trivalent)      Bilateral wrist and hand pain with swelling, numbness, and suspected inflammatory arthritis Severe bilateral wrist and hand pain with swelling. Symptoms impact daily activities. - Order labs for autoimmune conditions, including rheumatoid arthritis. - Advise follow-up with ortho - Recommend wrist braces during day and night. - Advise Tylenol  and topical rubs for pain. - Encourage rest and minimal hand use.     CKD Avoid NSAIDs.   HTN BP at goal.   Susan Haney Susan Search, FNP

## 2024-04-10 LAB — C-REACTIVE PROTEIN: CRP: 38 mg/L — ABNORMAL HIGH (ref 0–10)

## 2024-04-10 LAB — ANA,IFA RA DIAG PNL W/RFLX TIT/PATN
ANA Titer 1: NEGATIVE
Cyclic Citrullin Peptide Ab: 6 U (ref 0–19)
Rheumatoid fact SerPl-aCnc: 11 [IU]/mL (ref <14.0)

## 2024-04-10 LAB — SEDIMENTATION RATE: Sed Rate: 58 mm/h — ABNORMAL HIGH (ref 0–40)

## 2024-04-11 ENCOUNTER — Ambulatory Visit: Payer: Self-pay | Admitting: Family Medicine

## 2024-04-11 DIAGNOSIS — M2540 Effusion, unspecified joint: Secondary | ICD-10-CM

## 2024-04-11 DIAGNOSIS — M2550 Pain in unspecified joint: Secondary | ICD-10-CM

## 2024-04-11 DIAGNOSIS — R700 Elevated erythrocyte sedimentation rate: Secondary | ICD-10-CM

## 2024-04-11 DIAGNOSIS — R7982 Elevated C-reactive protein (CRP): Secondary | ICD-10-CM

## 2024-04-24 NOTE — Progress Notes (Signed)
 "  Office Visit Note  Patient: Susan Haney             Date of Birth: 1931-08-26           MRN: [id number]             PCP: Joesph Annabella HERO, FNP Referring: Joesph Annabella HERO, FNP Visit Date: 04/25/2024 Occupation: @GUAROCC @  Subjective:  New Patient (Initial Visit) (Joint pain in bilateral hand, interfering with ADLs. Patient also complains of pain in her ankles.)   Discussed the use of AI scribe software for clinical note transcription with the patient, who gave verbal consent to proceed.  History of Present Illness Susan Haney is a 89 year old female with arthritis who presents with new onset hand pain and swelling.  She has been experiencing new onset hand pain and swelling over the past month, distinct from her previous arthritis pain, with significant swelling that initially obscured the spaces between her fingers. The symptoms began after a day of chopping vegetables for a family event at the end of July, worsening by the first week of August.   She has a history of arthritis in various joints and previously underwent carpal tunnel surgery on one hand. Despite the surgery, she continues to experience numbness in two fingers of the operated hand. She reports that her orthopedic doctor took x-rays and told her that she had arthritis in her wrist; she received an injection that provided temporary relief for about a week. Her primary care doctor conducted a rheumatoid test, which returned negative.   She reports difficulty performing daily tasks such as opening bottles and using her hands due to stiffness and weakness, especially in the mornings. The stiffness improves by lunchtime but remains a significant issue. She has been using Tylenol  Precise cream for some relief, as she cannot take Advil  due to kidney concerns. She also wears braces at night to prevent her hands from curling up, which helps with morning stiffness. No recent illness or fever but notes increased activity prior  to the onset of symptoms. She does note that her pain and swelling has improved overall since the issue first started, but it is still a significant issue.  She has a history of gout in one foot many years ago and reports a trigger finger that has been problematic but improved over time. She also mentions a past injury where she caught her finger in a refrigerator door, causing pain to radiate up her arm.  Her social history includes living alone, with her children residing out of town. She has difficulty performing household tasks due to her hand pain and weakness, impacting her ability to maintain her independence.    Activities of Daily Living:  Patient reports morning stiffness for 1 hour.   Patient Reports nocturnal pain.  Difficulty dressing/grooming: Denies Difficulty climbing stairs: Reports Difficulty getting out of chair: Reports Difficulty using hands for taps, buttons, cutlery, and/or writing: Reports  Review of Systems  Constitutional:  Positive for fatigue.  HENT:  Negative for mouth sores and mouth dryness.   Eyes:  Positive for dryness.  Respiratory:  Positive for shortness of breath.   Cardiovascular:  Negative for chest pain and palpitations.  Gastrointestinal:  Negative for blood in stool, constipation and diarrhea.  Endocrine: Positive for increased urination.  Genitourinary:  Negative for involuntary urination.  Musculoskeletal:  Positive for joint pain, gait problem, joint pain, joint swelling, muscle weakness and morning stiffness. Negative for myalgias, muscle tenderness and myalgias.  Skin:  Positive for rash. Negative for color change, hair loss and sensitivity to sunlight.  Allergic/Immunologic: Negative for susceptible to infections.  Neurological:  Positive for headaches. Negative for dizziness.  Hematological:  Negative for swollen glands.  Psychiatric/Behavioral:  Positive for sleep disturbance. Negative for depressed mood. The patient is nervous/anxious.       Rheum History: #N/A   PMFS History:  Patient Active Problem List   Diagnosis Date Noted   Carpal tunnel syndrome on left 02/13/2024   Iron deficiency anemia 10/04/2023   Prediabetes 05/14/2022   Chronic maxillary sinusitis 05/14/2022   Seasonal allergies 09/14/2021   Chronic eczema 09/14/2021   Burning sensation of lower extremity 09/14/2021   Degenerative disc disease, cervical 09/14/2021   Chronic idiopathic constipation 06/17/2021   Stage 3b chronic kidney disease (HCC) 05/06/2021   Primary hypertension 08/06/2019   Vitamin D  deficiency 08/06/2019   Family history of prostate cancer    Family history of cancer    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 10/20/2016   Gastroesophageal reflux disease 06/30/2015   Chronic lower back pain 06/18/2014    Past Medical History:  Diagnosis Date   Allergy    Anemia    history ,  took iron for years   Arthritis    Bicuspid aortic valve    Cancer (HCC) 2017   endometrial cancer / cervical   Cataract    Cellulitis 12/31/2017   BLE, ankles   Chronic back pain    Chronic kidney disease    DDD (degenerative disc disease), lumbar    Diabetes mellitus without complication (HCC)    Endometrial cancer (HCC)    GERD (gastroesophageal reflux disease)    History of radiation therapy 06/15/16-07/06/16   vaginal cuff 30 Gy in 5 fractions   Hypertension    Neuromuscular disorder (HCC)    Prediabetes    Spondylosis    lumbar    Family History  Problem Relation Age of Onset   Hypertension Mother    Hyperlipidemia Mother    Diabetes Mother    Prostate cancer Father 107   Cancer Sister    Liver cancer Sister    Heart disease Sister    Cancer Sister    Stroke Brother    Arthritis Brother        back issues and knee    Diabetes Maternal Grandmother    Heart attack Maternal Grandfather    Heart disease Son    Diabetes Son    Arthritis Son    Hypertension Son    Hyperlipidemia Son    COPD Son    Cancer  Other 42       possibly pancreatic   Cancer Other        possibly uterine or ovarian   Past Surgical History:  Procedure Laterality Date   ABDOMINAL HYSTERECTOMY     BUNIONECTOMY     CARPAL TUNNEL RELEASE Right 05/30/2018   Procedure: RIGHT CARPAL TUNNEL RELEASE;  Surgeon: Murrell Kuba, MD;  Location: [facility];  Service: Orthopedics;  Laterality: Right;   COLONOSCOPY N/A 04/04/2014   Procedure: COLONOSCOPY;  Surgeon: Claudis RAYMOND Rivet, MD;  Location: [facility];  Service: Endoscopy;  Laterality: N/A;  100   EYE SURGERY     bil cataracts and lens implants   FOOT SURGERY     Left   FRACTURE SURGERY     JOINT REPLACEMENT     Ovary removed: benign     REPLACEMENT TOTAL KNEE BILATERAL  2013   ROBOTIC ASSISTED TOTAL HYSTERECTOMY WITH BILATERAL SALPINGO OOPHERECTOMY Left 04/13/2016   Procedure: XI ROBOTIC ASSISTED TOTAL HYSTERECTOMY WITH LEFT SALPINGO OOPHORECTOMY SENTINEL LYMPH NODE BIOPSY;  Surgeon: Maurilio Ship, MD;  Location: [facility];  Service: Gynecology;  Laterality: Left;   TOTAL KNEE REVISION Left 02/12/2016   Procedure: LEFT TOTAL KNEE REVISION;  Surgeon: Redell Shoals, MD;  Location: [facility];  Service: Orthopedics;  Laterality: Left;   Social History   Social History Narrative   2 sons - in [place] and [place]   Disabled sister lives close by   Immunization History  Administered Date(s) Administered   Fluad Quad(high Dose 65+) 05/06/2021   Fluad Trivalent(High Dose 65+) 04/26/2023   INFLUENZA, HIGH DOSE SEASONAL PF 04/23/2017, 05/09/2018, 05/09/2020, 04/05/2024   Influenza,inj,Quad PF,6+ Mos 04/23/2019   Influenza,inj,quad, With Preservative 04/18/2017   Moderna Covid-19 Vaccine Bivalent Booster 6yrs & up 04/29/2021   Moderna Sars-Covid-2 Vaccination 08/01/2019, 08/29/2019, 03/18/2020, 12/31/2020   Pneumococcal Conjugate-13 07/19/2016   Pneumococcal Polysaccharide-23 07/19/2008   Tdap 02/11/2016   Zoster Recombinant(Shingrix ) 11/03/2021      Objective: Vital Signs: BP (!) 157/70 (BP Location: Left Arm, Patient Position: Sitting, Cuff Size: Small)   Pulse 82   Temp 98.3 F (36.8 C)   Resp 14   Ht 5' 1.75 (1.568 m)   Wt 164 lb 3.2 oz (74.5 kg)   BMI 30.28 kg/m    Physical Exam Vitals and nursing note reviewed.  HENT:     Head: Normocephalic and atraumatic.     Nose: Nose normal.  Eyes:     Conjunctiva/sclera: Conjunctivae normal.     Pupils: Pupils are equal, round, and reactive to light.  Cardiovascular:     Rate and Rhythm: Normal rate and regular rhythm.     Heart sounds: Normal heart sounds.  Pulmonary:     Effort: Pulmonary effort is normal.     Breath sounds: Normal breath sounds.  Musculoskeletal:     Right lower leg: Edema (+1) present.     Left lower leg: Edema (+1) present.  Skin:    General: Skin is warm and dry.  Neurological:     Mental Status: She is alert. Mental status is at baseline.  Psychiatric:        Mood and Affect: Mood normal.        Behavior: Behavior normal.      Musculoskeletal Exam:   CDAI Exam: CDAI Score: -- Patient Global: --; Provider Global: -- Swollen: 9 ; Tender: 9  Joint Exam 04/25/2024      Right  Left  Wrist  Swollen Tender  Swollen Tender  MCP 2  Swollen Tender     MCP 3  Swollen Tender  Swollen Tender  MCP 4  Swollen Tender  Swollen Tender  PIP 2 (finger)  Swollen Tender     DIP 2 (finger)  Swollen Tender        Investigation: No additional findings.  Imaging: No results found.  Recent Labs: Lab Results  Component Value Date   WBC 6.5 11/14/2023   HGB 10.1 (L) 02/28/2024   PLT 357 11/14/2023   NA 139 02/20/2024   K 4.0 02/20/2024   CL 100 02/20/2024   CO2 21 02/20/2024   GLUCOSE 97 02/20/2024   BUN 27 02/20/2024   CREATININE 1.21 (H) 02/20/2024   BILITOT <0.2 02/13/2024   ALKPHOS 48 02/13/2024   AST 15 02/13/2024   ALT 12 02/13/2024   PROT 5.9 (L) 02/13/2024   ALBUMIN 3.9  02/13/2024   CALCIUM 9.2 02/20/2024   GFRAA 46 (L)  05/16/2020   Lab Results  Component Value Date   RF 11.0 04/05/2024    Speciality Comments: No specialty comments available.  Procedures:  No procedures performed Allergies: Patient has no known allergies.   Assessment / Plan:     Visit Diagnoses:  Inflammatory Arthritis Patient with ~3 months of joint pain w/ associated prolonged AM stiffness and visible synovitis, consistent with an inflammatory process. Her RF and ANA were negative on prior work-up, will obtain CCP and uric acid today. Primary differential includes erosive OA, seronegative RA. Will obtain DG Foot 2 Views Right, DG Foot 2 Views Left, DG Hand 2 View Left, DG Hand 2 View Right today as well.   Discussed with patient that options are limited given her co-morbidities and kidney disease. Pending results, may proceed with extremely low-dose prednisone  (5mg ) daily x 5-7 days to see if that calms down inflammatory symptoms. Discussed steroid side effects including weight gain, decreased bone density/fractures, risk for infection, avascular necrosis, hypertension, hyperglycemia, dyspepsia/gastric ulcers, fluid retention, weakness, bruising, acne, cataracts, insomnia, mood problems. Patient also instructed to take this medication with food, and not to take this medication with NSAIDs such as Aleve or Ibuprofen .  Patient verbalizes understanding.    Orders: Orders Placed This Encounter  Procedures   DG Foot 2 Views Right   DG Foot 2 Views Left   DG Hand 2 View Left   DG Hand 2 View Right   Cyclic citrul peptide antibody, IgG   Uric acid   No orders of the defined types were placed in this encounter.   I personally spent a total of 45 minutes in the care of the patient today including preparing to see the patient, getting/reviewing separately obtained history, performing a medically appropriate exam/evaluation, counseling and educating, placing orders, and documenting clinical information in the EHR.  Follow-Up  Instructions: Return in about 6 weeks (around 06/06/2024).   Asberry Claw, DO "

## 2024-04-25 ENCOUNTER — Ambulatory Visit: Admission: RE | Admit: 2024-04-25 | Discharge: 2024-04-25 | Disposition: A | Source: Ambulatory Visit

## 2024-04-25 ENCOUNTER — Ambulatory Visit

## 2024-04-25 VITALS — BP 157/70 | HR 82 | Temp 98.3°F | Resp 14 | Ht 61.75 in | Wt 164.2 lb

## 2024-04-25 DIAGNOSIS — M19071 Primary osteoarthritis, right ankle and foot: Secondary | ICD-10-CM | POA: Diagnosis not present

## 2024-04-25 DIAGNOSIS — N1832 Chronic kidney disease, stage 3b: Secondary | ICD-10-CM | POA: Diagnosis not present

## 2024-04-25 DIAGNOSIS — M1380 Other specified arthritis, unspecified site: Secondary | ICD-10-CM

## 2024-04-25 DIAGNOSIS — M1812 Unilateral primary osteoarthritis of first carpometacarpal joint, left hand: Secondary | ICD-10-CM | POA: Diagnosis not present

## 2024-04-25 DIAGNOSIS — M47812 Spondylosis without myelopathy or radiculopathy, cervical region: Secondary | ICD-10-CM

## 2024-04-25 DIAGNOSIS — M19072 Primary osteoarthritis, left ankle and foot: Secondary | ICD-10-CM | POA: Diagnosis not present

## 2024-04-25 DIAGNOSIS — M1811 Unilateral primary osteoarthritis of first carpometacarpal joint, right hand: Secondary | ICD-10-CM | POA: Diagnosis not present

## 2024-04-26 LAB — URIC ACID: Uric Acid, Serum: 4.5 mg/dL (ref 2.5–7.0)

## 2024-04-26 LAB — CYCLIC CITRUL PEPTIDE ANTIBODY, IGG: Cyclic Citrullin Peptide Ab: <16 U

## 2024-04-27 ENCOUNTER — Other Ambulatory Visit: Payer: Self-pay | Admitting: Nurse Practitioner

## 2024-04-27 ENCOUNTER — Other Ambulatory Visit (INDEPENDENT_AMBULATORY_CARE_PROVIDER_SITE_OTHER): Payer: Self-pay | Admitting: Gastroenterology

## 2024-04-27 DIAGNOSIS — R600 Localized edema: Secondary | ICD-10-CM

## 2024-05-01 ENCOUNTER — Ambulatory Visit: Payer: Self-pay

## 2024-05-02 ENCOUNTER — Encounter (INDEPENDENT_AMBULATORY_CARE_PROVIDER_SITE_OTHER): Payer: Self-pay | Admitting: Gastroenterology

## 2024-05-03 ENCOUNTER — Ambulatory Visit: Admitting: Dermatology

## 2024-05-07 ENCOUNTER — Telehealth: Payer: Self-pay

## 2024-05-07 NOTE — Telephone Encounter (Signed)
 Patient called and states she saw on TV this past weekend where [facility] is now offering low-dose radiation for arthritis and patient would like to know if she is a candidate or if this would help her.   Patient is scheduled to follow up with you on 06/07/2024. Patient is requesting a return call.

## 2024-05-10 DIAGNOSIS — B351 Tinea unguium: Secondary | ICD-10-CM | POA: Diagnosis not present

## 2024-05-10 DIAGNOSIS — E1142 Type 2 diabetes mellitus with diabetic polyneuropathy: Secondary | ICD-10-CM | POA: Diagnosis not present

## 2024-05-10 DIAGNOSIS — L84 Corns and callosities: Secondary | ICD-10-CM | POA: Diagnosis not present

## 2024-05-10 DIAGNOSIS — M79674 Pain in right toe(s): Secondary | ICD-10-CM | POA: Diagnosis not present

## 2024-05-10 DIAGNOSIS — M79675 Pain in left toe(s): Secondary | ICD-10-CM | POA: Diagnosis not present

## 2024-05-14 ENCOUNTER — Other Ambulatory Visit: Payer: Self-pay

## 2024-05-14 MED ORDER — PREDNISONE 5 MG PO TABS: ORAL_TABLET | ORAL | 0 refills | Status: AC

## 2024-05-14 NOTE — Telephone Encounter (Signed)
 Patient asked again about the radiation therapy for arthritis. I advised patient this is new and our providers are finding out more information.

## 2024-05-29 NOTE — Progress Notes (Signed)
 Office Visit Note  Patient: Susan Haney             Date of Birth: [birth year]           MRN: [id number]             PCP: Joesph Annabella HERO, FNP Referring: Joesph Annabella HERO, FNP Visit Date: 06/07/2024 Occupation: Data Unavailable  Subjective:  Results   History of Present Illness: Susan Haney is a 89 y.o. female who is presenting for follow-up. She was last seen on 04/25/24 as a new patient where she presented with polyarthralgia of bilateral hands. At that time, concern for inflammatory arthritis however options limited due to age, as well as co-morbidities including CKD. Prednisone  5mg  x7 then 2.5mg  x 7 days provided.  Patient reports that her joints have started to feel a little better, no where near as significant severity or intensity as her initial encounter. She states they started to feel better prior to starting the prednisone , and did not notice any changes in her symptoms when she was on the prednisone .  She states that her joints in her hands still bother her, just not as severely. Discussed with patient the findings on her XR that suggested severe/erosive osteoarthritis of DIP's and PIP's w/ right wrist suggestive of prior fracture/ligamentous injury. Patient denies any prior injury to that wrist.  Patient states that her symptoms are manageable at this time, and she expresses concern starting new medications that will cause more issues.   Activities of Daily Living:  Patient reports morning stiffness for 3-4 hours.   Patient Denies nocturnal pain.  Difficulty dressing/grooming: Denies Difficulty climbing stairs: Reports Difficulty getting out of chair: Denies Difficulty using hands for taps, buttons, cutlery, and/or writing: Reports  Review of Systems  Constitutional:  Negative for fatigue.  HENT:  Negative for mouth sores and mouth dryness.   Eyes:  Positive for dryness.  Respiratory:  Negative for shortness of breath.   Cardiovascular:  Negative for chest  pain and palpitations.  Gastrointestinal:  Negative for blood in stool, constipation and diarrhea.  Endocrine: Negative for increased urination.  Genitourinary:  Negative for involuntary urination.  Musculoskeletal:  Positive for joint pain, gait problem, joint pain, joint swelling, myalgias, muscle weakness, morning stiffness and myalgias. Negative for muscle tenderness.  Skin:  Positive for hair loss. Negative for color change, rash and sensitivity to sunlight.  Allergic/Immunologic: Negative for susceptible to infections.  Neurological:  Positive for headaches. Negative for dizziness.  Hematological:  Negative for swollen glands.  Psychiatric/Behavioral:  Positive for sleep disturbance. Negative for depressed mood. The patient is not nervous/anxious.     PMFS History:  Patient Active Problem List   Diagnosis Date Noted   Carpal tunnel syndrome on left 02/13/2024   Iron deficiency anemia 10/04/2023   Prediabetes 05/14/2022   Chronic maxillary sinusitis 05/14/2022   Seasonal allergies 09/14/2021   Chronic eczema 09/14/2021   Burning sensation of lower extremity 09/14/2021   Degenerative disc disease, cervical 09/14/2021   Chronic idiopathic constipation 06/17/2021   Stage 3b chronic kidney disease (HCC) 05/06/2021   Primary hypertension 08/06/2019   Vitamin D  deficiency 08/06/2019   Family history of prostate cancer    Family history of cancer    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 10/20/2016   Gastroesophageal reflux disease 06/30/2015   Chronic lower back pain 06/18/2014    Past Medical History:  Diagnosis Date   Allergy    Anemia    history ,  took iron for years   Arthritis    Bicuspid aortic valve    Cancer (HCC) 2017   endometrial cancer / cervical   Cataract    Cellulitis 12/31/2017   BLE, ankles   Chronic back pain    Chronic kidney disease    DDD (degenerative disc disease), lumbar    Diabetes mellitus without complication (HCC)     Endometrial cancer (HCC)    GERD (gastroesophageal reflux disease)    History of radiation therapy 06/15/16-07/06/16   vaginal cuff 30 Gy in 5 fractions   Hypertension    Neuromuscular disorder (HCC)    Prediabetes    Spondylosis    lumbar    Family History  Problem Relation Age of Onset   Hypertension Mother    Hyperlipidemia Mother    Diabetes Mother    Prostate cancer Father 45   Cancer Sister    Liver cancer Sister    Heart disease Sister    Cancer Sister    Stroke Brother    Arthritis Brother        back issues and knee    Diabetes Maternal Grandmother    Heart attack Maternal Grandfather    Heart disease Son    Diabetes Son    Arthritis Son    Hypertension Son    Hyperlipidemia Son    COPD Son    Cancer Other 42       possibly pancreatic   Cancer Other        possibly uterine or ovarian   Past Surgical History:  Procedure Laterality Date   ABDOMINAL HYSTERECTOMY     BUNIONECTOMY     CARPAL TUNNEL RELEASE Right 05/30/2018   Procedure: RIGHT CARPAL TUNNEL RELEASE;  Surgeon: Murrell Kuba, MD;  Location: [facility];  Service: Orthopedics;  Laterality: Right;   COLONOSCOPY N/A 04/04/2014   Procedure: COLONOSCOPY;  Surgeon: Claudis RAYMOND Rivet, MD;  Location: [facility];  Service: Endoscopy;  Laterality: N/A;  100   EYE SURGERY     bil cataracts and lens implants   FOOT SURGERY     Left   FRACTURE SURGERY     JOINT REPLACEMENT     Ovary removed: benign     REPLACEMENT TOTAL KNEE BILATERAL     2013   ROBOTIC ASSISTED TOTAL HYSTERECTOMY WITH BILATERAL SALPINGO OOPHERECTOMY Left 04/13/2016   Procedure: XI ROBOTIC ASSISTED TOTAL HYSTERECTOMY WITH LEFT SALPINGO OOPHORECTOMY SENTINEL LYMPH NODE BIOPSY;  Surgeon: Maurilio Ship, MD;  Location: [facility];  Service: Gynecology;  Laterality: Left;   TOTAL KNEE REVISION Left 02/12/2016   Procedure: LEFT TOTAL KNEE REVISION;  Surgeon: Redell Shoals, MD;  Location: [facility];  Service: Orthopedics;  Laterality: Left;    Social History   Tobacco Use   Smoking status: Never    Passive exposure: Past   Smokeless tobacco: Never  Vaping Use   Vaping status: Never Used  Substance Use Topics   Alcohol use: No   Drug use: No   Social History   Social History Narrative   2 sons - in [place] and [place]   Disabled sister lives close by     Immunization History  Administered Date(s) Administered   Fluad Quad(high Dose 65+) 05/06/2021   Fluad Trivalent(High Dose 65+) 04/26/2023   INFLUENZA, HIGH DOSE SEASONAL PF 04/23/2017, 05/09/2018, 05/09/2020, 04/05/2024   Influenza,inj,Quad PF,6+ Mos 04/23/2019   Influenza,inj,quad, With Preservative 04/18/2017   Moderna Covid-19 Vaccine Bivalent Booster 41yrs & up 04/29/2021   Moderna  Sars-Covid-2 Vaccination 08/01/2019, 08/29/2019, 03/18/2020, 12/31/2020   Pneumococcal Conjugate-13 07/19/2016   Pneumococcal Polysaccharide-23 07/19/2008   Tdap 02/11/2016   Zoster Recombinant(Shingrix ) 11/03/2021     Objective: Vital Signs: BP (!) 146/73 (BP Location: Left Arm, Patient Position: Sitting, Cuff Size: Small)   Pulse 70   Temp 98.1 F (36.7 C)   Resp 12   Wt 162 lb 6.4 oz (73.7 kg)   BMI 30.69 kg/m    Physical Exam Vitals and nursing note reviewed.  HENT:     Head: Normocephalic and atraumatic.     Nose: Nose normal.  Eyes:     Conjunctiva/sclera: Conjunctivae normal.     Pupils: Pupils are equal, round, and reactive to light.  Cardiovascular:     Rate and Rhythm: Normal rate and regular rhythm.  Pulmonary:     Effort: Pulmonary effort is normal. No respiratory distress.  Musculoskeletal:     Comments: Heberden's and Bouchard's nodes noted b/l hands.  Skin:    General: Skin is warm and dry.  Neurological:     Mental Status: She is alert. Mental status is at baseline.  Psychiatric:        Mood and Affect: Mood normal.        Behavior: Behavior normal.      Musculoskeletal Exam:   CDAI Exam: CDAI Score: -- Patient Global: --;  Provider Global: -- Swollen: 6 ; Tender: 12  Joint Exam 06/07/2024      Right  Left  Wrist  Swollen Tender  Swollen Tender  MCP 2  Swollen Tender  Swollen Tender  MCP 3  Swollen Tender  Swollen Tender  Knee   Tender   Tender  Ankle   Tender   Tender  Subtalar   Tender   Tender     Investigation: No additional findings.  Imaging: No results found.  Recent Labs: Lab Results  Component Value Date   WBC 6.5 11/14/2023   HGB 10.8 (L) 05/31/2024   PLT 357 11/14/2023   NA 139 02/20/2024   K 4.0 02/20/2024   CL 100 02/20/2024   CO2 21 02/20/2024   GLUCOSE 97 02/20/2024   BUN 27 02/20/2024   CREATININE 1.21 (H) 02/20/2024   BILITOT <0.2 02/13/2024   ALKPHOS 48 02/13/2024   AST 15 02/13/2024   ALT 12 02/13/2024   PROT 5.9 (L) 02/13/2024   ALBUMIN 3.9 02/13/2024   CALCIUM 9.2 02/20/2024   GFRAA 46 (L) 05/16/2020    Speciality Comments: No specialty comments available.  Procedures:  No procedures performed Allergies: Patient has no known allergies.   Assessment / Plan:     Visit Diagnoses:   Inflammatory arthritis Patient with ~4 months of joint pain w/ associated prolonged AM stiffness and visible synovitis, consistent with an inflammatory process. She reports progressive improvement without intervention over the past month, making me suspect some transient inflammatory acute process. I would expect a true inflammatory arthritis to only progress over this time period without appropriate intervention (especially given patient denies any relief with prednisone ). However, she does continue to demonstrate synovitis on examination.  Discussed with patient that options are limited given her co-morbidities, including malignancy and kidney disease. Discussed with patient options for DMARD therapy such as Arava, which patient declined. Also discussed possibility of a higher dose prednisone  taper to see if this can calm down symptoms. Discussed steroid side effects including weight  gain, decreased bone density/fractures, risk for infection, avascular necrosis, hypertension, hyperglycemia, dyspepsia/gastric ulcers, fluid retention, weakness, bruising, acne, cataracts,  insomnia, mood problems. Patient also instructed to take this medication with food, and not to take this medication with NSAIDs such as Aleve or Ibuprofen . Patient wishes not to start prednisone  at this time given side effects.   Patient wishes to continue to monitor for improvement over the next few months. Advised patient to let us  know if she wishes to proceed with prednisone  and we will send in taper. Patient also instructed to let us  know if her symptoms worsen. Patient verbalizes understanding.   Erosive osteoarthritis Patient with evidence of erosive OA on imaging w/ evidence of nodular changes on exam. Patient's symptoms more consistent with inflammatory arthritis discussed above at this time. Continue conservative management.   Orders: No orders of the defined types were placed in this encounter.  No orders of the defined types were placed in this encounter.   I personally spent a total of 40 minutes in the care of the patient today including preparing to see the patient, getting/reviewing separately obtained history, performing a medically appropriate exam/evaluation, counseling and educating, documenting clinical information in the EHR, independently interpreting results, and communicating results.   Follow-Up Instructions: Return in about 6 months (around 12/05/2024).   Asberry Claw, DO  Note - This record has been created using Animal nutritionist.  Chart creation errors have been sought, but may not always  have been located. Such creation errors do not reflect on  the standard of medical care.

## 2024-05-31 ENCOUNTER — Encounter (INDEPENDENT_AMBULATORY_CARE_PROVIDER_SITE_OTHER): Payer: Self-pay | Admitting: Gastroenterology

## 2024-05-31 ENCOUNTER — Ambulatory Visit (INDEPENDENT_AMBULATORY_CARE_PROVIDER_SITE_OTHER): Admitting: Gastroenterology

## 2024-05-31 ENCOUNTER — Other Ambulatory Visit

## 2024-05-31 VITALS — BP 138/68 | HR 82 | Temp 98.2°F | Ht 61.0 in | Wt 159.8 lb

## 2024-05-31 DIAGNOSIS — D509 Iron deficiency anemia, unspecified: Secondary | ICD-10-CM

## 2024-05-31 DIAGNOSIS — K5904 Chronic idiopathic constipation: Secondary | ICD-10-CM

## 2024-05-31 NOTE — Progress Notes (Addendum)
 Referring Provider: Joesph Annabella HERO, FNP Primary Care Physician:  Joesph Annabella HERO, FNP Primary GI Physician: Dr. Eartha   Chief Complaint  Patient presents with   Follow-up    Pt arrives for follow up. Maybe having a few more Bms than normal. Is taking Amitiza  BID. No issues with GERD at this time.    HPI:   Susan Haney is a 89 y.o. female with past medical history of  anemia, arthritis, biscuspid aortic valve, DDD, GERD, HTN, prediabetes.   Patient presenting today for:  Follow up of IDA, Constipation  Last seen August, at that time doing better on amitiza  8mcg BID, metamucil daily, having a BM almost daily. GERD well controlled on omeprazole  20mg  PRN, no dysphagia   Labs in August with iron 57 TIBC 331 sat 17 ferritin 17 hgb 10.1    Present: Taking amitiza  BID, having usually 2 smaller BMs per day, sometimes will skip a few days and will take a third dose if this occurs, maybe twice per month. She reports some lower abdominal discomfort that improves with defecation. No BRB, stools are dark but she is taking PO iron once daily. She was on amitiza  24mcg BID back in 2021 but this was too strong and caused persistent diarrhea. She notes that bowels can be solid to more diarrhea, sometimes BMs are unpredictable. She denies any fecal incontinence. Often does not feel she empties out well with amitiza . She takes metamucil on occasion.    Pertinent history:  iron studies on 3/3 with TIBC 356, Iron 28, iron sat 8, ferritin 23, B12 728, folate 13.6.  Occult stool cards positive x3 in April 2025, patient was recommended to have EGD/colonoscopy though declined at that time and wanted to think about procedures.  Last Colonoscopy:04/04/14 Examination performed to cecum. Single left-sided diverticulum and external hemorrhoids otherwise normal colonoscopy.  Filed Weights   05/31/24 1135  Weight: 159 lb 12.8 oz (72.5 kg)     Past Medical History:  Diagnosis Date   Allergy     Anemia    history ,  took iron for years   Arthritis    Bicuspid aortic valve    Cancer (HCC) 2017   endometrial cancer / cervical   Cataract    Cellulitis 12/31/2017   BLE, ankles   Chronic back pain    Chronic kidney disease    DDD (degenerative disc disease), lumbar    Diabetes mellitus without complication (HCC)    Endometrial cancer (HCC)    GERD (gastroesophageal reflux disease)    History of radiation therapy 06/15/16-07/06/16   vaginal cuff 30 Gy in 5 fractions   Hypertension    Neuromuscular disorder (HCC)    Prediabetes    Spondylosis    lumbar    Past Surgical History:  Procedure Laterality Date   ABDOMINAL HYSTERECTOMY     BUNIONECTOMY     CARPAL TUNNEL RELEASE Right 05/30/2018   Procedure: RIGHT CARPAL TUNNEL RELEASE;  Surgeon: Murrell Kuba, MD;  Location: [facility];  Service: Orthopedics;  Laterality: Right;   COLONOSCOPY N/A 04/04/2014   Procedure: COLONOSCOPY;  Surgeon: Claudis RAYMOND Rivet, MD;  Location: [facility];  Service: Endoscopy;  Laterality: N/A;  100   EYE SURGERY     bil cataracts and lens implants   FOOT SURGERY     Left   FRACTURE SURGERY     JOINT REPLACEMENT     Ovary removed: benign     REPLACEMENT TOTAL KNEE BILATERAL  2013   ROBOTIC ASSISTED TOTAL HYSTERECTOMY WITH BILATERAL SALPINGO OOPHERECTOMY Left 04/13/2016   Procedure: XI ROBOTIC ASSISTED TOTAL HYSTERECTOMY WITH LEFT SALPINGO OOPHORECTOMY SENTINEL LYMPH NODE BIOPSY;  Surgeon: Maurilio Ship, MD;  Location: [facility];  Service: Gynecology;  Laterality: Left;   TOTAL KNEE REVISION Left 02/12/2016   Procedure: LEFT TOTAL KNEE REVISION;  Surgeon: Redell Shoals, MD;  Location: [facility];  Service: Orthopedics;  Laterality: Left;    Current Outpatient Medications  Medication Sig Dispense Refill   aspirin EC 81 MG tablet Take 81 mg by mouth daily.     Biotin 1000 MCG tablet Take 1,000 mcg by mouth daily.     cetirizine  (ZYRTEC ) 10 MG tablet Take 1 tablet (10 mg total) by  mouth daily. 90 tablet 1   Cholecalciferol 50 MCG (2000 UT) CAPS Take 2,000 Units by mouth.     Cyanocobalamin  (B-12 PO) Take by mouth.     dapagliflozin  propanediol (FARXIGA ) 10 MG TABS tablet TAKE 1 TABLET BY MOUTH EVERY DAY 90 tablet 1   ferrous sulfate  324 MG TBEC Take 324 mg by mouth.     fluticasone  (FLONASE ) 50 MCG/ACT nasal spray SPRAY 2 SPRAYS INTO EACH NOSTRIL EVERY DAY 48 mL 2   furosemide  (LASIX ) 40 MG tablet TAKE 1 TABLET BY MOUTH EVERY DAY 90 tablet 1   glucose blood (ACCU-CHEK AVIVA PLUS) test strip Use to check Blood Sugars 1-2 times daily. Dx E11.9 100 strip 12   lidocaine  (LIDODERM ) 5 % lidocaine  5 % topical patch  PLACE 1 PATCH ONTO THE SKIN DAILY. REMOVE & DISCARD PATCH WITHIN 12 HOURS OR AS DIRECTED BY MD     losartan  (COZAAR ) 25 MG tablet Take 0.5 tablets (12.5 mg total) by mouth daily. 90 tablet 3   lubiprostone  (AMITIZA ) 8 MCG capsule Take 1 capsule (8 mcg total) by mouth 2 (two) times daily with a meal. 200 capsule 2   Magnesium  Oxide (MAG-200 PO) Take by mouth daily at 6 (six) AM.     montelukast  (SINGULAIR ) 10 MG tablet Take 1 tablet (10 mg total) by mouth at bedtime. 90 tablet 3   Multiple Vitamins-Minerals (MULTIVITAMIN WITH MINERALS) tablet Take 1 tablet by mouth daily.     Omega-3 Fatty Acids (MAXEPA PO) Take 500 mg by mouth daily at 6 (six) AM.     omeprazole  (PRILOSEC) 20 MG capsule TAKE 1 CAPSULE (20 MG TOTAL) BY MOUTH DAILY AS NEEDED. TAKES AS NEEDED 90 capsule 1   predniSONE  (DELTASONE ) 5 MG tablet Take 5 mg by mouth daily with breakfast. (Patient taking differently: Take 5 mg by mouth daily with breakfast. Take 1 tablet (5 mg total) by mouth daily with breakfast for 7 days, THEN 0.5 tablets (2.5 mg total) daily with breakfast for 7 days. Take in the morning with breakfast. Do not take with NSAIDS. - Oral)     psyllium (METAMUCIL) 58.6 % packet Take 1 packet by mouth daily.     triamcinolone  cream (KENALOG ) 0.5 % APPLY TOPICALLY 3 TIMES A DAY 45 g 2   Elastic  Bandages & Supports (V-2 HIGH COMPRESSION HOSE) MISC 2 each by Does not apply route daily. (Patient not taking: Reported on 05/31/2024) 2 each 0   No current facility-administered medications for this visit.    Allergies as of 05/31/2024   (No Known Allergies)    Social History   Socioeconomic History   Marital status: Widowed    Spouse name: Not on file   Number of children: 2   Years  of education: Not on file   Highest education level: Some college, no degree  Occupational History   Occupation: retired  Tobacco Use   Smoking status: Never    Passive exposure: Past   Smokeless tobacco: Never  Vaping Use   Vaping status: Never Used  Substance and Sexual Activity   Alcohol use: No   Drug use: No   Sexual activity: Not Currently    Birth control/protection: None  Other Topics Concern   Not on file  Social History Narrative   2 sons - in [place] and [place]   Disabled sister lives close by   Social Drivers of [employer] Strain: Low Risk  (01/30/2024)   Overall Financial Resource Strain (CARDIA)    Difficulty of Paying Living Expenses: Not hard at all  Food Insecurity: No Food Insecurity (01/30/2024)   Hunger Vital Sign    Worried About Running Out of Food in the Last Year: Never true    Ran Out of Food in the Last Year: Never true  Transportation Needs: No Transportation Needs (01/30/2024)   PRAPARE - Administrator, Civil Service (Medical): No    Lack of Transportation (Non-Medical): No  Physical Activity: Sufficiently Active (01/30/2024)   Exercise Vital Sign    Days of Exercise per Week: 6 days    Minutes of Exercise per Session: 40 min  Stress: No Stress Concern Present (01/30/2024)   [employer] of Occupational Health - Occupational Stress Questionnaire    Feeling of Stress: Not at all  Social Connections: Moderately Integrated (01/30/2024)   Social Connection and Isolation Panel    Frequency of Communication with Friends and  Family: Three times a week    Frequency of Social Gatherings with Friends and Family: Once a week    Attends Religious Services: More than 4 times per year    Active Member of [employer] or Organizations: Yes    Attends Banker Meetings: More than 4 times per year    Marital Status: Widowed    Review of systems General: negative for malaise, night sweats, fever, chills, weight loss Neck: Negative for lumps, goiter, pain and significant neck swelling Resp: Negative for cough, wheezing, dyspnea at rest CV: Negative for chest pain, leg swelling, palpitations, orthopnea GI: denies melena, hematochezia, nausea, vomiting, diarrhea, constipation, dysphagia, odyonophagia, early satiety or unintentional weight loss. +constipation  MSK: Negative for joint pain or swelling, back pain, and muscle pain. Derm: Negative for itching or rash Psych: Denies depression, anxiety, memory loss, confusion. No homicidal or suicidal ideation.  Heme: Negative for prolonged bleeding, bruising easily, and swollen nodes. Endocrine: Negative for cold or heat intolerance, polyuria, polydipsia and goiter. Neuro: negative for tremor, gait imbalance, syncope and seizures. The remainder of the review of systems is noncontributory.  Physical Exam: BP 138/68   Pulse 82   Temp 98.2 F (36.8 C)   Ht 5' 1 (1.549 m)   Wt 159 lb 12.8 oz (72.5 kg)   BMI 30.19 kg/m  General:   Alert and oriented. No distress noted. Pleasant and cooperative.  Head:  Normocephalic and atraumatic. Eyes:  Conjuctiva clear without scleral icterus. Mouth:  Oral mucosa pink and moist. Good dentition. No lesions. Heart: Normal rate and rhythm, s1 and s2 heart sounds present.  Lungs: Clear lung sounds in all lobes. Respirations equal and unlabored. Abdomen:  +BS, soft, non-tender and non-distended. No rebound or guarding. No HSM or masses noted. Derm: No palmar erythema or jaundice Msk:  Symmetrical without gross deformities. Normal  posture. Extremities:  Without edema. Neurologic:  Alert and  oriented x4 Psych:  Alert and cooperative. Normal mood and affect.  Invalid input(s): 6 MONTHS   ASSESSMENT: ZERINA HALLINAN is a 89 y.o. female presenting today for follow up of IDA and constipation  IDA: history of IDA off and on for years though more recently with low iron levels and hemoglobin in 10 range starting in february with hemoccult cards positive x3. no instances of rectal bleeding or melena, no weight changes, bowel habit changes. I had a thorough discussion with the patient initially regarding recommendations of EGD and Colonoscopy (to include risks vs benefits) as there are many causes of IDA/blood loss in the GI tract, most importantly to include possible presence of malignancy which we cannot exclude without bidirectional endoscopic evaluation. We discussed this again today, in depth. She is still on the fence regarding endoscopic evaluations. I again urged the patient to consider EGD/Colonoscopy. Will continue with PO iron and update labs to see how iron levels and hemoglobin are looking. She should make me aware if she wishes to pursue endoscopic evaluations.    Constipation: currently on amitiza  8mcg BID and metamucil PRN. BMs are sporadic, sometimes goes a few days without one. Did not tolerate amitiza  24mcg BID previously as she had perfuse diarrhea on this dose, at this time, recommended increasing water  intake, continue amitiza  8mcg BID, metamucil daily. Consider addition of miralax  if bowels still not moving more regularly with addition of daily metamucil.    PLAN:  -continue amitiza  8mcg BID -metamucil once daily -consider adding miralax  daily if bowels not moving regularly with daily use of metamucil  -good water  intake -high fiber diet -continue PO iron daily  -pt to consider EGD/colonoscopy -repeat h&h, Iron studies  All questions were answered, patient verbalized understanding and is in agreement  with plan as outlined above.    Follow Up: 3 months   Susan Persichetti L. Mariette, MSN, APRN, AGNP-C Adult-Gerontology Nurse Practitioner [facility]  I have reviewed the note and agree with the APP's assessment as described in this progress note  Toribio Fortune, MD Gastroenterology and Hepatology [facility]

## 2024-05-31 NOTE — Patient Instructions (Addendum)
-  continue amitiza  8mcg twice daily -metamucil once daily -consider adding miralax  1 capful daily if  -good water  intake -high fiber diet -continue PO iron daily -you can let me know if you wish to do EGD/colonscopy -repeat iron studies and check blood counts  Follow up 3 months  It was a pleasure to see you today. I want to create trusting relationships with patients and provide genuine, compassionate, and quality care. I truly value your feedback! please be on the lookout for a survey regarding your visit with me today. I appreciate your input about our visit and your time in completing this!    Elijah Phommachanh L. Sweet Jarvis, MSN, APRN, AGNP-C Adult-Gerontology Nurse Practitioner [facility] at [facility]

## 2024-06-01 LAB — IRON,TIBC AND FERRITIN PANEL
Ferritin: 42 ng/mL (ref 15–150)
Iron Saturation: 6 % — CL (ref 15–55)
Iron: 23 ug/dL — ABNORMAL LOW (ref 27–139)
Total Iron Binding Capacity: 355 ug/dL (ref 250–450)
UIBC: 332 ug/dL (ref 118–369)

## 2024-06-01 LAB — HEMOGLOBIN AND HEMATOCRIT, BLOOD
Hematocrit: 34.8 % (ref 34.0–46.6)
Hemoglobin: 10.8 g/dL — ABNORMAL LOW (ref 11.1–15.9)

## 2024-06-04 ENCOUNTER — Ambulatory Visit (INDEPENDENT_AMBULATORY_CARE_PROVIDER_SITE_OTHER): Payer: Self-pay | Admitting: Gastroenterology

## 2024-06-07 ENCOUNTER — Ambulatory Visit

## 2024-06-07 VITALS — BP 146/73 | HR 70 | Temp 98.1°F | Resp 12 | Wt 162.4 lb

## 2024-06-07 DIAGNOSIS — M1380 Other specified arthritis, unspecified site: Secondary | ICD-10-CM

## 2024-06-07 DIAGNOSIS — M154 Erosive (osteo)arthritis: Secondary | ICD-10-CM

## 2024-06-19 ENCOUNTER — Telehealth: Payer: Self-pay

## 2024-06-19 NOTE — Telephone Encounter (Signed)
 Pt called and would like a refill of the medication Dr. Luba gave her or give her another medication for her pain in her hands. Pt stated that her and Dr. Luba talked about this at her last appointment and was advised to call here if the pain gets worse.

## 2024-06-20 ENCOUNTER — Ambulatory Visit (INDEPENDENT_AMBULATORY_CARE_PROVIDER_SITE_OTHER): Payer: Medicare Other

## 2024-06-20 VITALS — BP 146/73 | HR 70 | Ht 61.0 in | Wt 162.0 lb

## 2024-06-20 DIAGNOSIS — Z0000 Encounter for general adult medical examination without abnormal findings: Secondary | ICD-10-CM

## 2024-06-20 DIAGNOSIS — Z139 Encounter for screening, unspecified: Secondary | ICD-10-CM

## 2024-06-20 NOTE — Progress Notes (Signed)
 Chief Complaint  Patient presents with   Medicare Wellness     Subjective:   Susan Haney is a 89 y.o. female who presents for a Medicare Annual Wellness Visit.  Visit info / Clinical Intake: Medicare Wellness Visit Type:: Subsequent Annual Wellness Visit Persons participating in visit and providing information:: patient Medicare Wellness Visit Mode:: Telephone If telephone:: video declined Since this visit was completed virtually, some vitals may be partially provided or unavailable. Missing vitals are due to the limitations of the virtual format.: Documented vitals are patient reported If Telephone or Video please confirm:: I connected with patient using audio/video enable telemedicine. I verified patient identity with two identifiers, discussed telehealth limitations, and patient agreed to proceed. Patient Location:: home Provider Location:: home office Interpreter Needed?: No Pre-visit prep was completed: yes AWV questionnaire completed by patient prior to visit?: no Living arrangements:: (!) lives alone Patient's Overall Health Status Rating: very good Typical amount of pain: none Does pain affect daily life?: no Are you currently prescribed opioids?: no  Dietary Habits and Nutritional Risks How many meals a day?: 2 Eats fruit and vegetables daily?: yes Most meals are obtained by: preparing own meals In the last 2 weeks, have you had any of the following?: none Diabetic:: no  Functional Status Activities of Daily Living (to include ambulation/medication): (Patient-Rptd) Independent Ambulation: (Patient-Rptd) Independent Medication Administration: Independent Home Management (perform basic housework or laundry): (Patient-Rptd) Independent Manage your own finances?: yes Primary transportation is: driving Concerns about hearing?: no  Fall Screening Falls in the past year?: (Patient-Rptd) 0 Number of falls in past year: (Patient-Rptd) 0 Was there an injury with  Fall?: (Patient-Rptd) 0 Fall Risk Category Calculator: (Patient-Rptd) 0 Patient Fall Risk Level: (Patient-Rptd) Low Fall Risk  Fall Risk Patient at Risk for Falls Due to: No Fall Risks Fall risk Follow up: Falls evaluation completed; Education provided  Home and Transportation Safety: All rugs have non-skid backing?: yes All stairs or steps have railings?: yes Grab bars in the bathtub or shower?: yes Have non-skid surface in bathtub or shower?: yes Good home lighting?: yes Regular seat belt use?: yes Hospital stays in the last year:: no  Cognitive Assessment Difficulty concentrating, remembering, or making decisions? : no Will 6CIT or Mini Cog be Completed: yes What year is it?: 0 points What month is it?: 0 points Give patient an address phrase to remember (5 components): [address] [place] About what time is it?: 0 points Count backwards from 20 to 1: 0 points Say the months of the year in reverse: 0 points Repeat the address phrase from earlier: 0 points 6 CIT Score: 0 points  Advance Directives (For Healthcare) Does Patient Have a Medical Advance Directive?: No Would patient like information on creating a medical advance directive?: No - Patient declined  Reviewed/Updated  Reviewed/Updated: Reviewed All (Medical, Surgical, Family, Medications, Allergies, Care Teams, Patient Goals); Medical History; Surgical History; Family History; Medications; Allergies; Care Teams; Patient Goals    Allergies (verified) Patient has no known allergies.   Current Medications (verified) Outpatient Encounter Medications as of 06/20/2024  Medication Sig   aspirin EC 81 MG tablet Take 81 mg by mouth daily.   Biotin 1000 MCG tablet Take 1,000 mcg by mouth daily.   cetirizine  (ZYRTEC ) 10 MG tablet Take 1 tablet (10 mg total) by mouth daily.   Cholecalciferol 50 MCG (2000 UT) CAPS Take 2,000 Units by mouth.   Cyanocobalamin  (B-12 PO) Take by mouth.   dapagliflozin  propanediol  (FARXIGA )  10 MG TABS tablet TAKE 1 TABLET BY MOUTH EVERY DAY   ferrous sulfate  324 MG TBEC Take 324 mg by mouth.   fluticasone  (FLONASE ) 50 MCG/ACT nasal spray SPRAY 2 SPRAYS INTO EACH NOSTRIL EVERY DAY   furosemide  (LASIX ) 40 MG tablet TAKE 1 TABLET BY MOUTH EVERY DAY   glucose blood (ACCU-CHEK AVIVA PLUS) test strip Use to check Blood Sugars 1-2 times daily. Dx E11.9   lidocaine  (LIDODERM ) 5 % lidocaine  5 % topical patch  PLACE 1 PATCH ONTO THE SKIN DAILY. REMOVE & DISCARD PATCH WITHIN 12 HOURS OR AS DIRECTED BY MD   losartan  (COZAAR ) 25 MG tablet Take 0.5 tablets (12.5 mg total) by mouth daily.   lubiprostone  (AMITIZA ) 8 MCG capsule Take 1 capsule (8 mcg total) by mouth 2 (two) times daily with a meal.   Magnesium  Oxide (MAG-200 PO) Take by mouth daily at 6 (six) AM.   montelukast  (SINGULAIR ) 10 MG tablet Take 1 tablet (10 mg total) by mouth at bedtime.   Multiple Vitamins-Minerals (MULTIVITAMIN WITH MINERALS) tablet Take 1 tablet by mouth daily.   Omega-3 Fatty Acids (MAXEPA PO) Take 500 mg by mouth daily at 6 (six) AM.   psyllium (METAMUCIL) 58.6 % packet Take 1 packet by mouth daily.   triamcinolone  cream (KENALOG ) 0.5 % APPLY TOPICALLY 3 TIMES A DAY   Elastic Bandages & Supports (V-2 HIGH COMPRESSION HOSE) MISC 2 each by Does not apply route daily. (Patient not taking: Reported on 06/20/2024)   omeprazole  (PRILOSEC) 20 MG capsule TAKE 1 CAPSULE (20 MG TOTAL) BY MOUTH DAILY AS NEEDED. TAKES AS NEEDED (Patient not taking: Reported on 06/20/2024)   [DISCONTINUED] predniSONE  (DELTASONE ) 5 MG tablet Take 5 mg by mouth daily with breakfast. (Patient not taking: Reported on 06/07/2024)   No facility-administered encounter medications on file as of 06/20/2024.    History: Past Medical History:  Diagnosis Date   Allergy    Anemia    history ,  took iron for years   Arthritis    Bicuspid aortic valve    Cancer (HCC) 2017   endometrial cancer / cervical   Cataract    Cellulitis 12/31/2017    BLE, ankles   Chronic back pain    Chronic kidney disease    DDD (degenerative disc disease), lumbar    Diabetes mellitus without complication (HCC)    Endometrial cancer (HCC)    GERD (gastroesophageal reflux disease)    History of radiation therapy 06/15/16-07/06/16   vaginal cuff 30 Gy in 5 fractions   Hypertension    Neuromuscular disorder (HCC)    Prediabetes    Spondylosis    lumbar   Past Surgical History:  Procedure Laterality Date   ABDOMINAL HYSTERECTOMY     BUNIONECTOMY     CARPAL TUNNEL RELEASE Right 05/30/2018   Procedure: RIGHT CARPAL TUNNEL RELEASE;  Surgeon: Murrell Kuba, MD;  Location: [facility];  Service: Orthopedics;  Laterality: Right;   COLONOSCOPY N/A 04/04/2014   Procedure: COLONOSCOPY;  Surgeon: Claudis RAYMOND Rivet, MD;  Location[facility];  Service: Endoscopy;  Laterality: N/A;  100   EYE SURGERY     bil cataracts and lens implants   FOOT SURGERY     Left   FRACTURE SURGERY     JOINT REPLACEMENT     Ovary removed: benign     REPLACEMENT TOTAL KNEE BILATERAL     2013   ROBOTIC ASSISTED TOTAL HYSTERECTOMY WITH BILATERAL SALPINGO OOPHERECTOMY Left 04/13/2016   Procedure: XI  ROBOTIC ASSISTED TOTAL HYSTERECTOMY WITH LEFT SALPINGO OOPHORECTOMY SENTINEL LYMPH NODE BIOPSY;  Surgeon: Maurilio Ship, MD;  Location: [facility];  Service: Gynecology;  Laterality: Left;   TOTAL KNEE REVISION Left 02/12/2016   Procedure: LEFT TOTAL KNEE REVISION;  Surgeon: Redell Shoals, MD;  Location: [facility];  Service: Orthopedics;  Laterality: Left;   Family History  Problem Relation Age of Onset   Hypertension Mother    Hyperlipidemia Mother    Diabetes Mother    Prostate cancer Father 65   Cancer Sister    Liver cancer Sister    Heart disease Sister    Cancer Sister    Stroke Brother    Arthritis Brother        back issues and knee    Diabetes Maternal Grandmother    Heart attack Maternal Grandfather    COPD Son    Heart disease Son    Diabetes Son     Arthritis Son    Hypertension Son    Hyperlipidemia Son    COPD Son    Cancer Other 42       possibly pancreatic   Cancer Other        possibly uterine or ovarian   Social History   Occupational History   Occupation: retired  Tobacco Use   Smoking status: Never    Passive exposure: Past   Smokeless tobacco: Never  Vaping Use   Vaping status: Never Used  Substance and Sexual Activity   Alcohol use: Never   Drug use: Never   Sexual activity: Not Currently    Birth control/protection: None   Tobacco Counseling Counseling given: Yes  SDOH Screenings   Food Insecurity: No Food Insecurity (06/16/2024)  Housing: Low Risk  (06/16/2024)  Transportation Needs: No Transportation Needs (06/16/2024)  Utilities: Not At Risk (06/17/2023)  Alcohol Screen: Low Risk  (06/16/2022)  Depression (PHQ2-9): Low Risk  (06/20/2024)  Recent Concern: Depression (PHQ2-9) - Medium Risk (04/05/2024)  Financial Resource Strain: Low Risk  (06/16/2024)  Physical Activity: Insufficiently Active (06/16/2024)  Social Connections: Moderately Integrated (06/16/2024)  Stress: No Stress Concern Present (06/16/2024)  Tobacco Use: Low Risk  (06/20/2024)  Health Literacy: Medium Risk (06/11/2020)   [facility]   See flowsheets for full screening details  Depression Screen PHQ 2 & 9 Depression Scale- Over the past 2 weeks, how often have you been bothered by any of the following problems? Little interest or pleasure in doing things: 0 Feeling down, depressed, or hopeless (PHQ Adolescent also includes...irritable): 0 PHQ-2 Total Score: 0 Trouble falling or staying asleep, or sleeping too much: 3 Feeling tired or having little energy: 0 Poor appetite or overeating (PHQ Adolescent also includes...weight loss): 0 Feeling bad about yourself - or that you are a failure or have let yourself or your family down: 0 Trouble concentrating on things, such as reading the newspaper or watching television  (PHQ Adolescent also includes...like school work): 0 Moving or speaking so slowly that other people could have noticed. Or the opposite - being so fidgety or restless that you have been moving around a lot more than usual: 0 Thoughts that you would be better off dead, or of hurting yourself in some way: 0 PHQ-9 Total Score: 6 If you checked off any problems, how difficult have these problems made it for you to do your work, take care of things at home, or get along with other people?: Extremely dIfficult  Depression Treatment Depression Interventions/Treatment : Patient refuses Treatment  Goals Addressed             This Visit's Progress    Remain active and independent   On track            Objective:    Today's Vitals   06/20/24 0951  BP: (!) 146/73  Pulse: 70  Weight: 162 lb (73.5 kg)  Height: 5' 1 (1.549 m)   Body mass index is 30.61 kg/m.  Hearing/Vision screen Hearing Screening - Comments:: Pt denies hearing dif Vision Screening - Comments:: Pt wear glasses/pt goes [facility] [place]/ last ov 2024 Immunizations and Health Maintenance Health Maintenance  Topic Date Due   Zoster Vaccines- Shingrix  (2 of 2) 02/12/2025 (Originally 12/29/2021)   COVID-19 Vaccine (6 - 2025-26 season) 04/21/2025 (Originally 03/19/2024)   Medicare Annual Wellness (AWV)  06/20/2025   DTaP/Tdap/Td (2 - Td or Tdap) 02/10/2026   Bone Density Scan  11/17/2026   Pneumococcal Vaccine: 50+ Years  Completed   Influenza Vaccine  Completed   Meningococcal B Vaccine  Aged Out        Assessment/Plan:  This is a routine wellness examination for Susan Haney.  Patient Care Team: Joesph Annabella HERO, FNP as PCP - General (Family Medicine) Vicci Mcardle, OD (Optometry) Billee Mliss BIRCH, RPH-CPP as [facility] Care Management (Pharmacist) Fidel Rogue, MD as Consulting Physician (Orthopedic Surgery) Mariette Mitzie CROME, NP as Nurse Practitioner (Gastroenterology)  I have  personally reviewed and noted the following in the patient's chart:   Medical and social history Use of alcohol, tobacco or illicit drugs  Current medications and supplements including opioid prescriptions. Functional ability and status Nutritional status Physical activity Advanced directives List of other physicians Hospitalizations, surgeries, and ER visits in previous 12 months Vitals Screenings to include cognitive, depression, and falls Referrals and appointments  Orders Placed This Encounter  Procedures   AMB Referral VBCI Care Management    Referral Priority:   Routine    Referral Type:   Consultation    Referral Reason:   Care Coordination    Number of Visits Requested:   1   In addition, I have reviewed and discussed with patient certain preventive protocols, quality metrics, and best practice recommendations. A written personalized care plan for preventive services as well as general preventive health recommendations were provided to patient.   Ozie Ned, CMA   06/20/2024   Return in 1 year (on 06/20/2025).  After Visit Summary: (MyChart) Due to this being a telephonic visit, the after visit summary with patients personalized plan was offered to patient via MyChart   Nurse Notes: n/a

## 2024-06-21 ENCOUNTER — Other Ambulatory Visit (INDEPENDENT_AMBULATORY_CARE_PROVIDER_SITE_OTHER): Payer: Self-pay

## 2024-06-21 ENCOUNTER — Telehealth: Payer: Self-pay

## 2024-06-21 DIAGNOSIS — D509 Iron deficiency anemia, unspecified: Secondary | ICD-10-CM

## 2024-06-21 NOTE — Progress Notes (Unsigned)
 Complex Care Management Note Care Guide Note  06/21/2024 Name: Susan Haney MRN: [id number] DOB: 05-Nov-1931   Complex Care Management Outreach Attempts: An unsuccessful telephone outreach was attempted today to offer the patient information about available complex care management services.  Follow Up Plan:  Additional outreach attempts will be made to offer the patient complex care management information and services.   Encounter Outcome:  No Answer  Jeoffrey Buffalo , RMA       [facility], [facility] Guide  Direct Dial: [phone]  Website: [email or web address]

## 2024-06-21 NOTE — Telephone Encounter (Signed)
 Reached out to patient. She states the pain and swelling is worse and now down into her wrist. Patient states she does not want to try to the Arava as she does not want to decrease her immune system. Patient states she would like to have the prednisone . She states it can be the high dose or the lower dose what ever you recommend. Please advise.

## 2024-06-25 NOTE — Progress Notes (Signed)
 Complex Care Management Note  Care Guide Note 06/25/2024 Name: UNA YEOMANS MRN: [id number] DOB: 1932-05-08  Jaynee LELON Heath is a 89 y.o. year old female who sees Joesph Annabella HERO, FNP for primary care. I reached out to Jaynee LELON Smock by phone today to offer complex care management services.  Ms. Hardwick was given information about Complex Care Management services today including:   The Complex Care Management services include support from the care team which includes your Nurse Care Manager, Clinical Social Worker, or Pharmacist.  The Complex Care Management team is here to help remove barriers to the health concerns and goals most important to you. Complex Care Management services are voluntary, and the patient may decline or stop services at any time by request to their care team member.   Complex Care Management Consent Status: Patient agreed to services and verbal consent obtained.   Follow up plan:  Telephone appointment with complex care management team member scheduled for:  07/25/2023  Encounter Outcome:  Patient Scheduled  Jeoffrey Buffalo , RMA     [place]  [facility], [facility] Guide  Direct Dial: [phone]  Website: [email or web address]

## 2024-06-26 ENCOUNTER — Other Ambulatory Visit: Payer: Self-pay

## 2024-06-26 ENCOUNTER — Telehealth: Payer: Self-pay

## 2024-06-26 NOTE — Patient Outreach (Signed)
 Social Drivers of Health  Community Resource and Care Coordination Visit Note   06/26/2024  Name: Susan Haney MRN: [id number] DOB:February 19, 1932  Situation: Referral received for [facility] needs assessment and assistance related to Cleaning services. I obtained verbal consent from Patient.  Visit completed with Patient on the phone.   Background:      Assessment:   Goals Addressed             This Visit's Progress    BSW Goals       Current SDOH Barriers:  House cleaning services  Interventions: Patient interviewed and appropriate screenings performed Provided patient with information about Insurance does not cover cleaning services Discussed plans with patient for ongoing follow up and provided patient with direct contact number Advised patient to contact local cleaning companies for rates Collaborated with [employer] (community agency) re: but they do not provide cleaning services and did not have a recommendation.  Patient declines the need for personal care. SW provided [employer], [employer], and [employer] for options to call for rates. Patient asked church members but no one can help.            Recommendation:   Patient will call 3 cleaning companies for a quote.  Follow Up Plan:   Telephone follow up appointment date/time:  06/29/24 at 11am  [name], BSW [place]  [facility], [facility] Social Worker Direct Dial: [phone]  Fax: [phone] Website: [email or web address]

## 2024-06-26 NOTE — Patient Instructions (Signed)
 Visit Information  Thank you for taking time to visit with me today. Please don't hesitate to contact me if I can be of assistance to you before our next scheduled appointment.  Our next appointment is by telephone on 06/29/24 at 11am Please call the care guide team at [phone] if you need to cancel or reschedule your appointment.   Following is a copy of your care plan:   Goals Addressed             This Visit's Progress    BSW Goals       Current SDOH Barriers:  House cleaning services  Interventions: Patient interviewed and appropriate screenings performed Provided patient with information about Insurance does not cover cleaning services Discussed plans with patient for ongoing follow up and provided patient with direct contact number Advised patient to contact local cleaning companies for rates Collaborated with [employer] (community agency) re: but they do not provide cleaning services and did not have a recommendation.  Patient declines the need for personal care. SW provided [employer], [employer], and [employer] for options to call for rates. Patient asked church members but no one can help.            Please call 911 if you are experiencing a Mental Health or Behavioral Health Crisis or need someone to talk to.  Patient verbalized understanding of Care plan and visit instructions communicated this visit  Susan Haney, BSW [name]  [facility], [facility] Social Worker Direct Dial: [phone]  Fax: [phone] Website: [email or web address]

## 2024-06-26 NOTE — Progress Notes (Signed)
 Complex Care Management Note Care Guide Note  06/26/2024 Name: Susan Haney MRN: [id number] DOB: 1932/05/14  Susan Haney is a 89 y.o. year old female who is a primary care patient of Joesph Annabella HERO, FNP . The community resource team was consulted for assistance with In-home cleaning assistance.  SDOH screenings and interventions completed:  Yes  Social Drivers of Health From This Encounter   Food Insecurity: No Food Insecurity (06/26/2024)   Hunger Vital Sign    Worried About Running Out of Food in the Last Year: Never true    Ran Out of Food in the Last Year: Never true  Housing: Low Risk  (06/26/2024)   Housing Stability Vital Sign    Unable to Pay for Housing in the Last Year: No    Number of Times Moved in the Last Year: 0    Homeless in the Last Year: No  Financial Resource Strain: Low Risk  (06/26/2024)   Overall Financial Resource Strain (CARDIA)    Difficulty of Paying Living Expenses: Not hard at all  Transportation Needs: No Transportation Needs (06/26/2024)   PRAPARE - Administrator, Civil Service (Medical): No    Lack of Transportation (Non-Medical): No  Utilities: Not At Risk (06/26/2024)   Utilities    Threatened with loss of utilities: No    SDOH Interventions Today    Flowsheet Row Most Recent Value  SDOH Interventions   Housing Interventions Other (Comment)  [Verified home address to send information for [employer].Patient stated she is calling a list of cleaning services given to her by the SW.]     Care guide performed the following interventions: Patient provided with information about care guide support team and interviewed to confirm resource needs.  Follow Up Plan:  No further follow up planned at this time. The patient has been provided with needed resources.  Encounter Outcome:  Patient Visit Completed  Sidonie Dexheimer [facility]  [facility] Guide Direct Dial:  [phone]  Fax: [phone] Website: [email or web address]

## 2024-06-27 MED ORDER — PREDNISONE 5 MG PO TABS: ORAL_TABLET | ORAL | 0 refills | Status: AC

## 2024-06-27 NOTE — Addendum Note (Signed)
 Addended by: LUBA STABS A on: 06/27/2024 07:50 PM   Modules accepted: Orders

## 2024-06-28 NOTE — Telephone Encounter (Signed)
 Patient advised prednisone  sent to pharmacy, remind her not to take with NSAIDS and to take with food. Patient scheduled for a follow up 08/30/2024.

## 2024-06-29 ENCOUNTER — Other Ambulatory Visit: Payer: Self-pay

## 2024-06-29 NOTE — Patient Outreach (Signed)
 Social Drivers of Health  Community Resource and Care Coordination Visit Note   06/29/2024  Name: Susan Haney MRN: [id number] DOB:08-16-1931  Situation: Referral received for [facility] needs assessment and assistance related to Cleaning services. I obtained verbal consent from Patient.  Visit completed with Patient on the phone.   Background:      Assessment:   Goals Addressed             This Visit's Progress    BSW Goals       Current SDOH Barriers:  House cleaning services  Interventions: Patient interviewed and appropriate screenings performed Provided patient with information about Insurance does not cover cleaning services Discussed plans with patient for ongoing follow up and provided patient with direct contact number Advised patient to contact local cleaning companies for rates SW provided additional cleaning resources [employer], Special Educational Needs Teacher, [employer]. Patient contacted [employer], [employer], and [employer] they provide commercial cleaning. Patient was given ADTS to contact, but confirms she does not need personal care and only wants cleaning services.  Patient will follow up to inquire.  SW informed of waiting list for services.            Recommendation:   Patient will contact cleaning services and ADTS.  Follow Up Plan:   Telephone follow up appointment date/time:  07/04/24 at 10am  [name], BSW   [facility], [facility] Social Worker Direct Dial: [phone]  Fax: [phone] Website: [email or web address]

## 2024-06-29 NOTE — Patient Instructions (Signed)
 Visit Information  Thank you for taking time to visit with me today. Please don't hesitate to contact me if I can be of assistance to you before our next scheduled appointment.  Your next care management appointment is by telephone on 07/04/24 at 10am   Please call the care guide team at [phone] if you need to cancel, schedule, or reschedule an appointment.   Please call 911 if you are experiencing a Mental Health or Behavioral Health Crisis or need someone to talk to.  [name], BSW   [facility], [facility] Social Worker Direct Dial: [phone]  Fax: [phone] Website: [email or web address]

## 2024-07-04 ENCOUNTER — Other Ambulatory Visit

## 2024-07-04 NOTE — Patient Instructions (Signed)
 Visit Information  Thank you for taking time to visit with me today. Please don't hesitate to contact me if I can be of assistance to you before our next scheduled appointment.  Your next care management appointment is by telephone on 07/17/24 at 11am   Please call the care guide team at [phone] if you need to cancel, schedule, or reschedule an appointment.   Please call 911 if you are experiencing a Mental Health or Behavioral Health Crisis or need someone to talk to.  [name], BSW [place]  [facility], [facility] Social Worker Direct Dial: [phone]  Fax: [phone] Website: [email or web address]

## 2024-07-04 NOTE — Patient Outreach (Signed)
 Social Drivers of Health  Community Resource and Care Coordination Visit Note   07/04/2024  Name: Susan Haney MRN: [id number] DOB:03-19-1932  Situation: Referral received for [facility] needs assessment and assistance related to [facility] cleaning services. I obtained verbal consent from Patient.  Visit completed with Patient on the phone.   Background:   SDOH Interventions Today    Flowsheet Row Most Recent Value  SDOH Interventions   Housing Interventions Other (Comment)  [Pt was not able to find private cleaning service. SW t/c [employer] and staff provided referral for Rosina Benders to contact patient. SW t/c [facility] and was provided same name for assistance.]     Assessment:   Goals Addressed             This Visit's Progress    BSW Goals       Current SDOH Barriers:  House cleaning services  Interventions: Patient interviewed and appropriate screenings performed Provided patient with information about Insurance does not cover cleaning services Discussed plans with patient for ongoing follow up and provided patient with direct contact number Patient contacted additional cleaning services but either the number is not in service or she is out of the service area.   SW t/c [phone] and staff Kandy) provided a referral for Rosina Benders for local cleaning services.  Patient will await follow up from Ms. Benders. SW t/c [facility] and spoke to staff Rosslyn) who provided the same referral for Rosina Benders and also [employer] [phone].          Recommendation:   Patient will await follow up from cleaning person.  Follow Up Plan:   Telephone follow up appointment date/time:  07/17/24 at 11am.  [name], BSW [place]  [facility], [facility] Social Worker Direct Dial: [phone]  Fax: [phone] Website: [email or web address]

## 2024-07-06 ENCOUNTER — Other Ambulatory Visit

## 2024-07-06 NOTE — Patient Outreach (Signed)
 Social Drivers of Health  Community Resource and Care Coordination Visit Note   07/06/2024  Name: Susan Haney MRN: [id number] DOB:07-30-1931  Situation: Referral received for [facility] needs assessment and assistance related to Financial Strain . I obtained verbal consent from Patient.  Visit completed with Patient on the phone.   Background:   SDOH Interventions Today    Flowsheet Row Most Recent Value  SDOH Interventions   Financial Strain Interventions Other (Comment)  [Pt has agreed to work with Rosina today at 7pm for housekeeping.]     Assessment:   Goals Addressed             This Visit's Progress    BSW Goals       Current SDOH Barriers:  House cleaning services  Interventions: Patient interviewed and appropriate screenings performed Discussed plans with patient for ongoing follow up and provided patient with direct contact number Patient was contacted by Rosina Benders for cleaning services today at 7pm but there is confusion because someone else has called trying to schedule. SW received t/c from Rosina Benders Ada [phone] per a referral from [facility].  It appears there are 2 people with the same name that was referred to patient.   SW t/c [employer] [phone] and staff Kandy) provided confirmation and contact number for Rosina Benders [phone] that is scheduled today at 7pm.  SW t/c Rosina Benders and left voicemail to call prior to visit due to confusion with the referral.  Patient will await follow up from Ms. Benders.  Patient will inform Rosina Benders Ada that services will not be needed at this time.         Recommendation:   Patient will work with Rosina Benders for house keeping needs.  Follow Up Plan:   Telephone follow up appointment date/time:  07/17/24 11am  Tillman Carolynn HEDWIG [facility]  [facility], [facility] Social Worker Direct Dial: [phone]  Fax: [phone] Website:  [email or web address]

## 2024-07-06 NOTE — Patient Instructions (Signed)
 Visit Information  Thank you for taking time to visit with me today. Please don't hesitate to contact me if I can be of assistance to you before our next scheduled appointment.  Your next care management appointment is by telephone on 07/17/24 at 11am   Please call the care guide team at [phone] if you need to cancel, schedule, or reschedule an appointment.   Please call 911 if you are experiencing a Mental Health or Behavioral Health Crisis or need someone to talk to.  [name], BSW [place]  [facility], [facility] Social Worker Direct Dial: [phone]  Fax: [phone] Website: [email or web address]

## 2024-07-17 ENCOUNTER — Other Ambulatory Visit: Payer: Self-pay

## 2024-07-17 NOTE — Patient Outreach (Signed)
 Social Drivers of Health  Community Resource and Care Coordination Visit Note   07/17/2024  Name: Susan Haney MRN: [id number] DOB:[birth year]  Situation: Referral received for [facility] needs assessment and assistance related to [employer]. I obtained verbal consent from Patient.  Visit completed with Patient on the phone.   Background:   SDOH Interventions Today    Flowsheet Row Most Recent Value  SDOH Interventions   Financial Strain Interventions Other (Comment)  [Pt obtained private cleaning person and the rate is $25 which pt can afford. Pt is very satisfied.]     Assessment:   Goals Addressed             This Visit's Progress    COMPLETED: BSW Goals       Current SDOH Barriers:  House cleaning services  Interventions: Patient interviewed and appropriate screenings performed Patient will continue to use private cleaning service and is able to afford the rate. Goal completed and patient reports no other unmet needs.        Recommendation:   Patient will continue to work with private cleaning service.  Follow Up Plan:   Patient has achieved all patient stated goals. [employer] will be closed. Patient has been provided contact information should new needs arise.   [name], BSW [place]  [facility], [facility] Social Worker Direct Dial: [phone]  Fax: [phone] Website: [email or web address]

## 2024-07-17 NOTE — Patient Instructions (Signed)

## 2024-07-24 ENCOUNTER — Other Ambulatory Visit: Payer: Self-pay | Admitting: *Deleted

## 2024-07-25 ENCOUNTER — Telehealth: Payer: Self-pay | Admitting: *Deleted

## 2024-07-25 ENCOUNTER — Encounter: Payer: Self-pay | Admitting: *Deleted

## 2024-07-25 NOTE — Patient Instructions (Signed)
 Susan Haney - I am sorry I was unable to reach you today for our scheduled appointment. I work with Joesph Annabella HERO, FNP and am calling to support your healthcare needs. Please contact me at [phone] at your earliest convenience. I look forward to speaking with you soon.   Thank you,  Rosina Forte, BSN RN [facility], [facility] RN Care Manager Direct Dial: [phone]  Fax: [phone]

## 2024-07-26 ENCOUNTER — Other Ambulatory Visit: Payer: Self-pay | Admitting: *Deleted

## 2024-07-26 ENCOUNTER — Encounter: Payer: Self-pay | Admitting: *Deleted

## 2024-07-26 NOTE — Patient Outreach (Signed)
 Complex Care Management   Visit Note  07/26/2024  Name:  Susan Haney MRN: [id number] DOB: 1931-09-10  Situation: Referral received for Complex Care Management related to SDOH Referral I obtained verbal consent from Patient.  Visit completed with Patient  on the phone  Background:   Past Medical History:  Diagnosis Date   Allergy    Anemia    history ,  took iron for years   Arthritis    Bicuspid aortic valve    Cancer (HCC) 2017   endometrial cancer / cervical   Cataract    Cellulitis 12/31/2017   BLE, ankles   Chronic back pain    Chronic kidney disease    DDD (degenerative disc disease), lumbar    Diabetes mellitus without complication (HCC)    Endometrial cancer (HCC)    GERD (gastroesophageal reflux disease)    History of radiation therapy 06/15/16-07/06/16   vaginal cuff 30 Gy in 5 fractions   Hypertension    Neuromuscular disorder (HCC)    Prediabetes    Spondylosis    lumbar    Assessment: Patient Reported Symptoms:  Cognitive        Neurological      HEENT        Cardiovascular Cardiovascular Symptoms Reported: Swelling in legs or feet Does patient have uncontrolled Hypertension?: No Cardiovascular Management Strategies: Routine screening Cardiovascular Self-Management Outcome: 3 (uncertain)  Respiratory Respiratory Symptoms Reported: No symptoms reported Respiratory Management Strategies: Routine screening Respiratory Self-Management Outcome: 4 (good)  Endocrine Endocrine Symptoms Reported: No symptoms reported Is patient diabetic?: No Endocrine Self-Management Outcome: 4 (good)  Gastrointestinal Gastrointestinal Symptoms Reported: Constipation Gastrointestinal Self-Management Outcome: 4 (good)    Genitourinary Genitourinary Symptoms Reported: Frequency Genitourinary Self-Management Outcome: 4 (good)  Integumentary Integumentary Symptoms Reported: Other Other Integumentary Symptoms: ezcema on abdomen & lower back Skin Management Strategies:  Medication therapy, Routine screening Skin Self-Management Outcome: 4 (good)  Musculoskeletal Musculoskelatal Symptoms Reviewed: Unsteady gait, Limited mobility, Back pain Musculoskeletal Management Strategies: Coping strategies Musculoskeletal Self-Management Outcome: 4 (good) Falls in the past year?: No Number of falls in past year: 1 or less Was there an injury with Fall?: No Fall Risk Category Calculator: 0 Patient Fall Risk Level: Low Fall Risk Patient at Risk for Falls Due to: No Fall Risks Fall risk Follow up: Falls evaluation completed  Psychosocial Psychosocial Symptoms Reported: No symptoms reported Behavioral Management Strategies: Coping strategies Behavioral Health Self-Management Outcome: 4 (good) Major Change/Loss/Stressor/Fears (CP): Denies Techniques to Cope with Loss/Stress/Change: Not applicable      07/26/2024    PHQ2-9 Depression Screening   Little interest or pleasure in doing things Several days  Feeling down, depressed, or hopeless Not at all  PHQ-2 - Total Score 1  Trouble falling or staying asleep, or sleeping too much    Feeling tired or having little energy    Poor appetite or overeating     Feeling bad about yourself - or that you are a failure or have let yourself or your family down    Trouble concentrating on things, such as reading the newspaper or watching television    Moving or speaking so slowly that other people could have noticed.  Or the opposite - being so fidgety or restless that you have been moving around a lot more than usual    Thoughts that you would be better off dead, or hurting yourself in some way    PHQ2-9 Total Score    If you checked off any problems, how difficult have  these problems made it for you to do your work, take care of things at home, or get along with other people    Depression Interventions/Treatment      Today's Vitals   07/26/24 1506  BP: 126/81  Pulse: 71   Pain Scale: 0-10 Pain Score: 7  Pain Type:  Chronic pain Pain Location: Wrist Pain Intervention(s): Medication (See eMAR)  Medications Reviewed Today   Medications were not reviewed in this encounter     Recommendation:   Continue Current Plan of Care  Follow Up Plan:   Closing From:  Complex Care Management  Rosina Forte, BSN RN [facility]  [facility], [facility] RN Care Manager Direct Dial: [phone]  Fax: [phone]

## 2024-07-26 NOTE — Patient Instructions (Signed)
 Susan Haney - I am sorry I was unable to reach you today for our scheduled appointment. I work with Joesph Annabella HERO, FNP and am calling to support your healthcare needs. Please contact me at [phone] at your earliest convenience. I look forward to speaking with you soon.   Thank you,  Rosina Forte, BSN RN [facility], [facility] RN Care Manager Direct Dial: [phone]  Fax: [phone]

## 2024-07-26 NOTE — Patient Instructions (Signed)
 Visit Information  Thank you for taking time to visit with me today. Please don't hesitate to contact me if I can be of assistance to you before our next scheduled appointment.  Our next appointment is no further scheduled appointments.   Please call the care guide team at [phone] if you need to cancel or reschedule your appointment.   Please call the Suicide and Crisis Lifeline: 988 call the [place]  National Suicide Prevention Lifeline: (506)820-5096 or TTY: 3301240226 TTY 804-744-4625) to talk to a trained counselor call 1-800-273-TALK (toll free, 24 hour hotline) if you are experiencing a Mental Health or Behavioral Health Crisis or need someone to talk to.  Patient verbalized understanding of Care plan and visit instructions communicated this visit  Rosina Forte, BSN RN [facility], [facility] Health RN Care Manager Direct Dial: [phone]  Fax: [phone]

## 2024-08-01 ENCOUNTER — Other Ambulatory Visit

## 2024-08-02 ENCOUNTER — Ambulatory Visit (INDEPENDENT_AMBULATORY_CARE_PROVIDER_SITE_OTHER): Payer: Self-pay | Admitting: Gastroenterology

## 2024-08-02 LAB — HEMOGLOBIN AND HEMATOCRIT, BLOOD
Hematocrit: 35.9 % (ref 34.0–46.6)
Hemoglobin: 10.8 g/dL — ABNORMAL LOW (ref 11.1–15.9)

## 2024-08-02 LAB — IRON,TIBC AND FERRITIN PANEL
Ferritin: 32 ng/mL (ref 15–150)
Iron Saturation: 21 % (ref 15–55)
Iron: 62 ug/dL (ref 27–139)
Total Iron Binding Capacity: 289 ug/dL (ref 250–450)
UIBC: 227 ug/dL (ref 118–369)

## 2024-08-15 ENCOUNTER — Ambulatory Visit: Payer: Self-pay | Admitting: Family Medicine

## 2024-08-20 ENCOUNTER — Ambulatory Visit: Admitting: Family Medicine

## 2024-08-21 ENCOUNTER — Other Ambulatory Visit (INDEPENDENT_AMBULATORY_CARE_PROVIDER_SITE_OTHER): Payer: Self-pay

## 2024-08-21 ENCOUNTER — Telehealth (INDEPENDENT_AMBULATORY_CARE_PROVIDER_SITE_OTHER): Payer: Self-pay

## 2024-08-21 DIAGNOSIS — K5904 Chronic idiopathic constipation: Secondary | ICD-10-CM

## 2024-08-21 MED ORDER — LUBIPROSTONE 8 MCG PO CAPS: 8.0000 ug | ORAL_CAPSULE | Freq: Two times a day (BID) | ORAL | 3 refills | Status: AC

## 2024-08-21 NOTE — Telephone Encounter (Signed)
 Patient called and asked that Lubiprostone  8 mcg bid 90 day supply be sent to [facility]. Per Last ov with Mitzie Boettcher, Patient to continue with this dosage. I have sent the medication into the requested pharmacy.

## 2024-08-23 NOTE — Progress Notes (Unsigned)
 "  Office Visit Note  Patient: Susan Haney             Date of Birth: [birth year]           MRN: [id number]             PCP: Joesph Annabella HERO, FNP Referring: Joesph Annabella HERO, FNP Visit Date: 08/30/2024 Occupation: Data Unavailable  Subjective:  No chief complaint on file.   History of Present Illness: Susan Haney is a 89 y.o. female with Inflammatory Arthritis and Erosive Osteoarthritis who is presenting for a 2 month follow up. She was last seen on 06/07/2024 at which time she decided not to start prednisone . She did call back at a later time and requested the prednisone  taper.     Activities of Daily Living:  Patient reports morning stiffness for *** {minute/hour:19697}.   Patient {ACTIONS;DENIES/REPORTS:21021675::Denies} nocturnal pain.  Difficulty dressing/grooming: {ACTIONS;DENIES/REPORTS:21021675::Denies} Difficulty climbing stairs: {ACTIONS;DENIES/REPORTS:21021675::Denies} Difficulty getting out of chair: {ACTIONS;DENIES/REPORTS:21021675::Denies} Difficulty using hands for taps, buttons, cutlery, and/or writing: {ACTIONS;DENIES/REPORTS:21021675::Denies}  No Rheumatology ROS completed.   PMFS History:  Patient Active Problem List   Diagnosis Date Noted   Carpal tunnel syndrome on left 02/13/2024   Iron deficiency anemia 10/04/2023   Prediabetes 05/14/2022   Chronic maxillary sinusitis 05/14/2022   Seasonal allergies 09/14/2021   Chronic eczema 09/14/2021   Burning sensation of lower extremity 09/14/2021   Degenerative disc disease, cervical 09/14/2021   Chronic idiopathic constipation 06/17/2021   Stage 3b chronic kidney disease (HCC) 05/06/2021   Primary hypertension 08/06/2019   Vitamin D  deficiency 08/06/2019   Family history of prostate cancer    Family history of cancer    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 10/20/2016   Gastroesophageal reflux disease 06/30/2015   Chronic lower back pain 06/18/2014    Past Medical  History:  Diagnosis Date   Allergy    Anemia    history ,  took iron for years   Arthritis    Bicuspid aortic valve    Cancer (HCC) 2017   endometrial cancer / cervical   Cataract    Cellulitis 12/31/2017   BLE, ankles   Chronic back pain    Chronic kidney disease    DDD (degenerative disc disease), lumbar    Diabetes mellitus without complication (HCC)    Endometrial cancer (HCC)    GERD (gastroesophageal reflux disease)    History of radiation therapy 06/15/16-07/06/16   vaginal cuff 30 Gy in 5 fractions   Hypertension    Neuromuscular disorder (HCC)    Prediabetes    Spondylosis    lumbar    Family History  Problem Relation Age of Onset   Hypertension Mother    Hyperlipidemia Mother    Diabetes Mother    Prostate cancer Father 75   Cancer Sister    Liver cancer Sister    Heart disease Sister    Cancer Sister    Stroke Brother    Arthritis Brother        back issues and knee    Diabetes Maternal Grandmother    Heart attack Maternal Grandfather    COPD Son    Heart disease Son    Diabetes Son    Arthritis Son    Hypertension Son    Hyperlipidemia Son    COPD Son    Cancer Other 42       possibly pancreatic   Cancer Other        possibly uterine or ovarian  Past Surgical History:  Procedure Laterality Date   ABDOMINAL HYSTERECTOMY     BUNIONECTOMY     CARPAL TUNNEL RELEASE Right 05/30/2018   Procedure: RIGHT CARPAL TUNNEL RELEASE;  Surgeon: Murrell Kuba, MD;  Location: [facility];  Service: Orthopedics;  Laterality: Right;   COLONOSCOPY N/A 04/04/2014   Procedure: COLONOSCOPY;  Surgeon: Claudis RAYMOND Rivet, MD;  [facility];  Service: Endoscopy;  Laterality: N/A;  100   EYE SURGERY     bil cataracts and lens implants   FOOT SURGERY     Left   FRACTURE SURGERY     JOINT REPLACEMENT     Ovary removed: benign     REPLACEMENT TOTAL KNEE BILATERAL     2013   ROBOTIC ASSISTED TOTAL HYSTERECTOMY WITH BILATERAL SALPINGO OOPHERECTOMY  Left 04/13/2016   Procedure: XI ROBOTIC ASSISTED TOTAL HYSTERECTOMY WITH LEFT SALPINGO OOPHORECTOMY SENTINEL LYMPH NODE BIOPSY;  Surgeon: Maurilio Ship, MD;  Location: [facility];  Service: Gynecology;  Laterality: Left;   TOTAL KNEE REVISION Left 02/12/2016   Procedure: LEFT TOTAL KNEE REVISION;  Surgeon: Redell Shoals, MD;  Location: [facility];  Service: Orthopedics;  Laterality: Left;   Social History[1] Social History   Social History Narrative   2 sons - in [place] and [place]   Disabled sister lives close by     Immunization History  Administered Date(s) Administered   Fluad Quad(high Dose 65+) 05/06/2021   Fluad Trivalent(High Dose 65+) 04/26/2023   INFLUENZA, HIGH DOSE SEASONAL PF 04/23/2017, 05/09/2018, 05/09/2020, 04/05/2024   Influenza,inj,Quad PF,6+ Mos 04/23/2019   Influenza,inj,quad, With Preservative 04/18/2017   Moderna Covid-19 Vaccine Bivalent Booster 18yrs & up 04/29/2021   Moderna Sars-Covid-2 Vaccination 08/01/2019, 08/29/2019, 03/18/2020, 12/31/2020   Pneumococcal Conjugate-13 07/19/2016   Pneumococcal Polysaccharide-23 07/19/2008   Tdap 02/11/2016   Zoster Recombinant(Shingrix ) 11/03/2021     Objective: Vital Signs: There were no vitals taken for this visit.   Physical Exam   Musculoskeletal Exam: ***  CDAI Exam: CDAI Score: -- Patient Global: --; Provider Global: -- Swollen: --; Tender: -- Joint Exam 08/30/2024   No joint exam has been documented for this visit   There is currently no information documented on the homunculus. Go to the Rheumatology activity and complete the homunculus joint exam.  Investigation: No additional findings.  Imaging: No results found.  Recent Labs: Lab Results  Component Value Date   WBC 6.5 11/14/2023   HGB 10.8 (L) 08/01/2024   PLT 357 11/14/2023   NA 139 02/20/2024   K 4.0 02/20/2024   CL 100 02/20/2024   CO2 21 02/20/2024   GLUCOSE 97 02/20/2024   BUN 27 02/20/2024   CREATININE 1.21 (H) 02/20/2024    BILITOT <0.2 02/13/2024   ALKPHOS 48 02/13/2024   AST 15 02/13/2024   ALT 12 02/13/2024   PROT 5.9 (L) 02/13/2024   ALBUMIN 3.9 02/13/2024   CALCIUM 9.2 02/20/2024   GFRAA 46 (L) 05/16/2020    Speciality Comments: No specialty comments available.  Procedures:  No procedures performed Allergies: Patient has no known allergies.   Assessment / Plan:     Visit Diagnoses: No diagnosis found.  Orders: No orders of the defined types were placed in this encounter.  No orders of the defined types were placed in this encounter.   Face-to-face time spent with patient was *** minutes. Greater than 50% of time was spent in counseling and coordination of care.  Follow-Up Instructions: No follow-ups on file.   Alfonso Patterson, LPN  Note - This record has been created using [employer].  Chart creation errors have been sought, but may not always  have been located. Such creation errors do not reflect on  the standard of medical care.    [1]  Social History Tobacco Use   Smoking status: Never    Passive exposure: Past   Smokeless tobacco: Never  Vaping Use   Vaping status: Never Used  Substance Use Topics   Alcohol use: Never   Drug use: Never   "

## 2024-08-30 ENCOUNTER — Ambulatory Visit

## 2024-08-30 DIAGNOSIS — M154 Erosive (osteo)arthritis: Secondary | ICD-10-CM

## 2024-08-30 DIAGNOSIS — M1380 Other specified arthritis, unspecified site: Secondary | ICD-10-CM

## 2024-09-03 ENCOUNTER — Ambulatory Visit: Admitting: Family Medicine

## 2024-12-06 ENCOUNTER — Ambulatory Visit

## 2025-06-21 ENCOUNTER — Ambulatory Visit
